# Patient Record
Sex: Female | Born: 1935 | Race: Black or African American | Hispanic: No | Smoking: Never smoker
Health system: Southern US, Community
[De-identification: ages and names within clinical notes are randomized; demographics above are authoritative.]

## PROBLEM LIST (undated history)

## (undated) DIAGNOSIS — K219 Gastro-esophageal reflux disease without esophagitis: Secondary | ICD-10-CM

## (undated) DIAGNOSIS — E785 Hyperlipidemia, unspecified: Secondary | ICD-10-CM

## (undated) DIAGNOSIS — Z8719 Personal history of other diseases of the digestive system: Secondary | ICD-10-CM

## (undated) DIAGNOSIS — M5416 Radiculopathy, lumbar region: Secondary | ICD-10-CM

## (undated) DIAGNOSIS — I10 Essential (primary) hypertension: Secondary | ICD-10-CM

## (undated) DIAGNOSIS — S2239XA Fracture of one rib, unspecified side, initial encounter for closed fracture: Secondary | ICD-10-CM

## (undated) DIAGNOSIS — S62109A Fracture of unspecified carpal bone, unspecified wrist, initial encounter for closed fracture: Secondary | ICD-10-CM

## (undated) DIAGNOSIS — C50911 Malignant neoplasm of unspecified site of right female breast: Secondary | ICD-10-CM

## (undated) DIAGNOSIS — S2249XA Multiple fractures of ribs, unspecified side, initial encounter for closed fracture: Secondary | ICD-10-CM

## (undated) DIAGNOSIS — E669 Obesity, unspecified: Secondary | ICD-10-CM

## (undated) DIAGNOSIS — M1990 Unspecified osteoarthritis, unspecified site: Secondary | ICD-10-CM

## (undated) DIAGNOSIS — C801 Malignant (primary) neoplasm, unspecified: Secondary | ICD-10-CM

## (undated) DIAGNOSIS — M545 Low back pain, unspecified: Secondary | ICD-10-CM

## (undated) DIAGNOSIS — E119 Type 2 diabetes mellitus without complications: Secondary | ICD-10-CM

## (undated) DIAGNOSIS — D0510 Intraductal carcinoma in situ of unspecified breast: Secondary | ICD-10-CM

## (undated) HISTORY — DX: Malignant neoplasm of unspecified site of right female breast: C50.911

## (undated) HISTORY — PX: VESICOVAGINAL FISTULA CLOSURE W/ TAH: SUR271

## (undated) HISTORY — PX: CATARACT EXTRACTION: SUR2

## (undated) HISTORY — PX: OTHER SURGICAL HISTORY: SHX169

## (undated) HISTORY — PX: CARPAL TUNNEL RELEASE: SHX101

## (undated) HISTORY — DX: Malignant (primary) neoplasm, unspecified: C80.1

## (undated) HISTORY — DX: Obesity, unspecified: E66.9

## (undated) HISTORY — DX: Gastro-esophageal reflux disease without esophagitis: K21.9

## (undated) HISTORY — DX: Unspecified osteoarthritis, unspecified site: M19.90

## (undated) HISTORY — DX: Fracture of unspecified carpal bone, unspecified wrist, initial encounter for closed fracture: S62.109A

## (undated) HISTORY — DX: Hyperlipidemia, unspecified: E78.5

## (undated) HISTORY — DX: Multiple fractures of ribs, unspecified side, initial encounter for closed fracture: S22.49XA

## (undated) HISTORY — DX: Intraductal carcinoma in situ of unspecified breast: D05.10

## (undated) HISTORY — DX: Essential (primary) hypertension: I10

## (undated) HISTORY — DX: Type 2 diabetes mellitus without complications: E11.9

## (undated) HISTORY — DX: Fracture of one rib, unspecified side, initial encounter for closed fracture: S22.39XA

---

## 2000-09-16 ENCOUNTER — Ambulatory Visit (HOSPITAL_COMMUNITY): Admission: RE | Admit: 2000-09-16 | Discharge: 2000-09-16 | Payer: Self-pay | Admitting: Family Medicine

## 2000-09-16 ENCOUNTER — Encounter: Payer: Self-pay | Admitting: Family Medicine

## 2000-09-26 ENCOUNTER — Emergency Department (HOSPITAL_COMMUNITY): Admission: EM | Admit: 2000-09-26 | Discharge: 2000-09-26 | Payer: Self-pay | Admitting: Emergency Medicine

## 2000-09-30 ENCOUNTER — Other Ambulatory Visit: Admission: RE | Admit: 2000-09-30 | Discharge: 2000-09-30 | Payer: Self-pay | Admitting: Family Medicine

## 2000-12-03 ENCOUNTER — Ambulatory Visit (HOSPITAL_COMMUNITY): Admission: RE | Admit: 2000-12-03 | Discharge: 2000-12-03 | Payer: Self-pay | Admitting: General Surgery

## 2001-09-17 ENCOUNTER — Ambulatory Visit (HOSPITAL_COMMUNITY): Admission: RE | Admit: 2001-09-17 | Discharge: 2001-09-17 | Payer: Self-pay | Admitting: Family Medicine

## 2001-09-17 ENCOUNTER — Encounter: Payer: Self-pay | Admitting: Family Medicine

## 2001-11-03 ENCOUNTER — Encounter (HOSPITAL_COMMUNITY): Admission: RE | Admit: 2001-11-03 | Discharge: 2001-12-03 | Payer: Self-pay | Admitting: Cardiology

## 2002-04-05 ENCOUNTER — Ambulatory Visit (HOSPITAL_COMMUNITY): Admission: RE | Admit: 2002-04-05 | Discharge: 2002-04-05 | Payer: Self-pay | Admitting: Family Medicine

## 2002-04-05 ENCOUNTER — Encounter: Payer: Self-pay | Admitting: Family Medicine

## 2002-09-20 ENCOUNTER — Encounter: Payer: Self-pay | Admitting: Family Medicine

## 2002-09-20 ENCOUNTER — Ambulatory Visit (HOSPITAL_COMMUNITY): Admission: RE | Admit: 2002-09-20 | Discharge: 2002-09-20 | Payer: Self-pay | Admitting: Family Medicine

## 2003-01-08 HISTORY — PX: OTHER SURGICAL HISTORY: SHX169

## 2003-09-26 ENCOUNTER — Ambulatory Visit (HOSPITAL_COMMUNITY): Admission: RE | Admit: 2003-09-26 | Discharge: 2003-09-26 | Payer: Self-pay | Admitting: Family Medicine

## 2003-11-08 ENCOUNTER — Ambulatory Visit (HOSPITAL_COMMUNITY): Admission: RE | Admit: 2003-11-08 | Discharge: 2003-11-08 | Payer: Self-pay | Admitting: *Deleted

## 2003-11-08 ENCOUNTER — Ambulatory Visit: Payer: Self-pay | Admitting: *Deleted

## 2003-11-11 ENCOUNTER — Encounter: Payer: Self-pay | Admitting: Emergency Medicine

## 2003-11-12 ENCOUNTER — Ambulatory Visit: Payer: Self-pay | Admitting: Physical Medicine & Rehabilitation

## 2003-11-12 ENCOUNTER — Inpatient Hospital Stay (HOSPITAL_COMMUNITY): Admission: EM | Admit: 2003-11-12 | Discharge: 2003-11-16 | Payer: Self-pay | Admitting: Emergency Medicine

## 2003-11-16 ENCOUNTER — Inpatient Hospital Stay (HOSPITAL_COMMUNITY)
Admission: RE | Admit: 2003-11-16 | Discharge: 2003-11-25 | Payer: Self-pay | Admitting: Physical Medicine & Rehabilitation

## 2003-12-19 ENCOUNTER — Ambulatory Visit: Payer: Self-pay | Admitting: Family Medicine

## 2003-12-20 ENCOUNTER — Encounter (HOSPITAL_COMMUNITY): Admission: RE | Admit: 2003-12-20 | Discharge: 2004-01-19 | Payer: Self-pay | Admitting: Orthopedic Surgery

## 2004-01-24 ENCOUNTER — Encounter (HOSPITAL_COMMUNITY): Admission: RE | Admit: 2004-01-24 | Discharge: 2004-02-23 | Payer: Self-pay | Admitting: Orthopedic Surgery

## 2004-02-06 ENCOUNTER — Ambulatory Visit: Payer: Self-pay | Admitting: Family Medicine

## 2004-06-25 ENCOUNTER — Ambulatory Visit: Payer: Self-pay | Admitting: Family Medicine

## 2004-06-29 ENCOUNTER — Ambulatory Visit (HOSPITAL_COMMUNITY): Admission: RE | Admit: 2004-06-29 | Discharge: 2004-06-29 | Payer: Self-pay | Admitting: Family Medicine

## 2004-07-11 ENCOUNTER — Ambulatory Visit (HOSPITAL_COMMUNITY): Admission: RE | Admit: 2004-07-11 | Discharge: 2004-07-11 | Payer: Self-pay | Admitting: Family Medicine

## 2004-09-26 ENCOUNTER — Ambulatory Visit (HOSPITAL_COMMUNITY): Admission: RE | Admit: 2004-09-26 | Discharge: 2004-09-26 | Payer: Self-pay | Admitting: Family Medicine

## 2004-10-03 ENCOUNTER — Ambulatory Visit: Payer: Self-pay | Admitting: Family Medicine

## 2005-01-11 ENCOUNTER — Ambulatory Visit: Payer: Self-pay | Admitting: Family Medicine

## 2005-02-01 ENCOUNTER — Ambulatory Visit: Payer: Self-pay | Admitting: Family Medicine

## 2005-03-27 ENCOUNTER — Ambulatory Visit (HOSPITAL_COMMUNITY): Admission: RE | Admit: 2005-03-27 | Discharge: 2005-03-27 | Payer: Self-pay | Admitting: Family Medicine

## 2005-05-07 ENCOUNTER — Ambulatory Visit: Payer: Self-pay | Admitting: Family Medicine

## 2005-05-13 ENCOUNTER — Ambulatory Visit: Payer: Self-pay | Admitting: Family Medicine

## 2005-05-13 ENCOUNTER — Ambulatory Visit (HOSPITAL_COMMUNITY): Admission: RE | Admit: 2005-05-13 | Discharge: 2005-05-13 | Payer: Self-pay | Admitting: Family Medicine

## 2005-06-24 ENCOUNTER — Ambulatory Visit: Payer: Self-pay | Admitting: Internal Medicine

## 2005-07-03 ENCOUNTER — Ambulatory Visit: Payer: Self-pay | Admitting: Family Medicine

## 2005-08-14 ENCOUNTER — Ambulatory Visit: Payer: Self-pay | Admitting: Family Medicine

## 2005-09-11 ENCOUNTER — Ambulatory Visit (HOSPITAL_COMMUNITY): Admission: RE | Admit: 2005-09-11 | Discharge: 2005-09-11 | Payer: Self-pay | Admitting: Family Medicine

## 2005-10-09 ENCOUNTER — Ambulatory Visit: Payer: Self-pay | Admitting: Family Medicine

## 2005-10-17 ENCOUNTER — Ambulatory Visit: Payer: Self-pay | Admitting: Family Medicine

## 2005-11-13 ENCOUNTER — Ambulatory Visit: Payer: Self-pay | Admitting: Family Medicine

## 2006-01-07 DIAGNOSIS — D0510 Intraductal carcinoma in situ of unspecified breast: Secondary | ICD-10-CM

## 2006-01-07 HISTORY — DX: Intraductal carcinoma in situ of unspecified breast: D05.10

## 2006-01-07 HISTORY — PX: BREAST LUMPECTOMY: SHX2

## 2006-01-31 ENCOUNTER — Ambulatory Visit: Payer: Self-pay | Admitting: Family Medicine

## 2006-01-31 LAB — CONVERTED CEMR LAB: Uric Acid, Serum: 3.1 mg/dL (ref 2.4–7.0)

## 2006-02-03 ENCOUNTER — Ambulatory Visit: Payer: Self-pay | Admitting: Family Medicine

## 2006-02-03 LAB — CONVERTED CEMR LAB: Total CK: 92 units/L (ref 7–177)

## 2006-02-04 ENCOUNTER — Ambulatory Visit (HOSPITAL_COMMUNITY): Admission: RE | Admit: 2006-02-04 | Discharge: 2006-02-04 | Payer: Self-pay | Admitting: Family Medicine

## 2006-03-12 ENCOUNTER — Ambulatory Visit: Payer: Self-pay | Admitting: Family Medicine

## 2006-03-19 ENCOUNTER — Ambulatory Visit: Payer: Self-pay | Admitting: Family Medicine

## 2006-03-31 ENCOUNTER — Ambulatory Visit: Payer: Self-pay | Admitting: Family Medicine

## 2006-03-31 LAB — CONVERTED CEMR LAB
ALT: 34 units/L (ref 0–35)
AST: 30 units/L (ref 0–37)
Albumin: 4.2 g/dL (ref 3.5–5.2)
Alkaline Phosphatase: 59 units/L (ref 39–117)
BUN: 15 mg/dL (ref 6–23)
Basophils Absolute: 0 10*3/uL (ref 0.0–0.1)
Basophils Relative: 0 % (ref 0–1)
Bilirubin, Direct: 0.1 mg/dL (ref 0.0–0.3)
CO2: 35 meq/L — ABNORMAL HIGH (ref 19–32)
Calcium: 10 mg/dL (ref 8.4–10.5)
Chloride: 93 meq/L — ABNORMAL LOW (ref 96–112)
Cholesterol: 165 mg/dL (ref 0–200)
Creatinine, Ser: 0.68 mg/dL (ref 0.40–1.20)
Eosinophils Absolute: 0.1 10*3/uL (ref 0.0–0.7)
Eosinophils Relative: 1 % (ref 0–5)
Glucose, Bld: 97 mg/dL (ref 70–99)
HCT: 42.8 % (ref 36.0–46.0)
HDL: 64 mg/dL (ref >39)
Hemoglobin: 14 g/dL (ref 12.0–15.0)
Indirect Bilirubin: 0.4 mg/dL (ref 0.0–0.9)
LDL Cholesterol: 84 mg/dL (ref 0–99)
Lymphocytes Relative: 20 % (ref 12–46)
Lymphs Abs: 1.2 10*3/uL (ref 0.7–3.3)
MCHC: 32.6 g/dL (ref 30.0–36.0)
MCV: 89.7 fL (ref 78.0–100.0)
Microalb, Ur: 1.72 mg/dL (ref 0.00–1.89)
Monocytes Absolute: 0.5 10*3/uL (ref 0.2–0.7)
Monocytes Relative: 8 % (ref 3–11)
Neutro Abs: 3.8 10*3/uL (ref 1.7–7.7)
Neutrophils Relative %: 69 % (ref 43–77)
Platelets: 340 10*3/uL (ref 150–400)
Potassium: 4.2 meq/L (ref 3.5–5.3)
RBC: 4.76 M/uL (ref 3.87–5.11)
RDW: 15.6 % — ABNORMAL HIGH (ref 11.5–14.0)
Sodium: 136 meq/L (ref 135–145)
Total Bilirubin: 0.5 mg/dL (ref 0.3–1.2)
Total CHOL/HDL Ratio: 2.6
Total Protein: 7.3 g/dL (ref 6.0–8.3)
Triglycerides: 85 mg/dL (ref <150)
VLDL: 17 mg/dL (ref 0–40)
WBC: 5.5 10*3/uL (ref 4.0–10.5)

## 2006-04-01 ENCOUNTER — Ambulatory Visit (HOSPITAL_COMMUNITY): Admission: RE | Admit: 2006-04-01 | Discharge: 2006-04-01 | Payer: Self-pay | Admitting: Family Medicine

## 2006-04-01 ENCOUNTER — Encounter: Payer: Self-pay | Admitting: Family Medicine

## 2006-04-01 LAB — CONVERTED CEMR LAB: Hgb A1c MFr Bld: 6.6 % — ABNORMAL HIGH (ref 4.6–6.1)

## 2006-04-05 ENCOUNTER — Emergency Department (HOSPITAL_COMMUNITY): Admission: EM | Admit: 2006-04-05 | Discharge: 2006-04-05 | Payer: Self-pay | Admitting: Emergency Medicine

## 2006-04-16 ENCOUNTER — Other Ambulatory Visit: Admission: RE | Admit: 2006-04-16 | Discharge: 2006-04-16 | Payer: Self-pay | Admitting: Family Medicine

## 2006-04-16 ENCOUNTER — Encounter: Payer: Self-pay | Admitting: Family Medicine

## 2006-04-16 ENCOUNTER — Ambulatory Visit: Payer: Self-pay | Admitting: Family Medicine

## 2006-04-21 ENCOUNTER — Ambulatory Visit (HOSPITAL_COMMUNITY): Admission: RE | Admit: 2006-04-21 | Discharge: 2006-04-21 | Payer: Self-pay | Admitting: Family Medicine

## 2006-05-06 ENCOUNTER — Ambulatory Visit: Payer: Self-pay | Admitting: Internal Medicine

## 2006-05-19 ENCOUNTER — Ambulatory Visit: Payer: Self-pay | Admitting: Internal Medicine

## 2006-05-19 ENCOUNTER — Ambulatory Visit (HOSPITAL_COMMUNITY): Admission: RE | Admit: 2006-05-19 | Discharge: 2006-05-19 | Payer: Self-pay | Admitting: Internal Medicine

## 2006-05-19 HISTORY — PX: COLONOSCOPY: SHX174

## 2006-06-17 ENCOUNTER — Ambulatory Visit: Payer: Self-pay | Admitting: Family Medicine

## 2006-09-15 ENCOUNTER — Ambulatory Visit (HOSPITAL_COMMUNITY): Admission: RE | Admit: 2006-09-15 | Discharge: 2006-09-15 | Payer: Self-pay | Admitting: Family Medicine

## 2006-09-22 ENCOUNTER — Encounter: Admission: RE | Admit: 2006-09-22 | Discharge: 2006-09-22 | Payer: Self-pay | Admitting: Family Medicine

## 2006-09-22 ENCOUNTER — Encounter (INDEPENDENT_AMBULATORY_CARE_PROVIDER_SITE_OTHER): Payer: Self-pay | Admitting: Diagnostic Radiology

## 2006-09-26 ENCOUNTER — Encounter: Payer: Self-pay | Admitting: Family Medicine

## 2006-09-26 LAB — CONVERTED CEMR LAB
ALT: 41 units/L — ABNORMAL HIGH (ref 0–35)
AST: 42 units/L — ABNORMAL HIGH (ref 0–37)
Albumin: 4 g/dL (ref 3.5–5.2)
Alkaline Phosphatase: 55 units/L (ref 39–117)
BUN: 19 mg/dL (ref 6–23)
Bilirubin, Direct: 0.1 mg/dL (ref 0.0–0.3)
CO2: 32 meq/L (ref 19–32)
Calcium: 10.1 mg/dL (ref 8.4–10.5)
Chloride: 101 meq/L (ref 96–112)
Creatinine, Ser: 0.58 mg/dL (ref 0.40–1.20)
Glucose, Bld: 105 mg/dL — ABNORMAL HIGH (ref 70–99)
Indirect Bilirubin: 0.6 mg/dL (ref 0.0–0.9)
Potassium: 3.7 meq/L (ref 3.5–5.3)
Sodium: 137 meq/L (ref 135–145)
Total Bilirubin: 0.7 mg/dL (ref 0.3–1.2)
Total Protein: 4.6 g/dL — ABNORMAL LOW (ref 6.0–8.3)

## 2006-09-28 ENCOUNTER — Encounter: Admission: RE | Admit: 2006-09-28 | Discharge: 2006-09-28 | Payer: Self-pay | Admitting: Family Medicine

## 2006-09-29 ENCOUNTER — Encounter: Payer: Self-pay | Admitting: Family Medicine

## 2006-09-29 LAB — CONVERTED CEMR LAB
HCV Ab: NEGATIVE
Hep A IgM: NEGATIVE
Hep B C IgM: NEGATIVE
Hepatitis B Surface Ag: NEGATIVE

## 2006-10-01 ENCOUNTER — Ambulatory Visit: Payer: Self-pay | Admitting: Family Medicine

## 2006-10-08 ENCOUNTER — Ambulatory Visit: Payer: Self-pay | Admitting: Gastroenterology

## 2006-11-03 ENCOUNTER — Encounter: Payer: Self-pay | Admitting: Family Medicine

## 2006-11-08 ENCOUNTER — Encounter: Payer: Self-pay | Admitting: Family Medicine

## 2006-11-08 LAB — CONVERTED CEMR LAB
ALT: 42 U/L — ABNORMAL HIGH
AST: 33 U/L
Albumin: 4.4 g/dL
Alkaline Phosphatase: 59 U/L
Bilirubin, Direct: 0.1 mg/dL
Indirect Bilirubin: 0.3 mg/dL
Total Bilirubin: 0.4 mg/dL
Total Protein: 7.3 g/dL

## 2006-11-10 ENCOUNTER — Ambulatory Visit: Payer: Self-pay | Admitting: Family Medicine

## 2007-01-08 HISTORY — PX: CHOLECYSTECTOMY: SHX55

## 2007-01-21 ENCOUNTER — Ambulatory Visit: Payer: Self-pay | Admitting: Family Medicine

## 2007-01-22 ENCOUNTER — Encounter: Payer: Self-pay | Admitting: Family Medicine

## 2007-01-22 LAB — CONVERTED CEMR LAB
ALT: 47 units/L — ABNORMAL HIGH (ref 0–35)
AST: 39 units/L — ABNORMAL HIGH (ref 0–37)
Albumin: 4.1 g/dL (ref 3.5–5.2)
Alkaline Phosphatase: 60 units/L (ref 39–117)
BUN: 13 mg/dL (ref 6–23)
Bilirubin, Direct: 0.1 mg/dL (ref 0.0–0.3)
CO2: 28 meq/L (ref 19–32)
Calcium: 10.1 mg/dL (ref 8.4–10.5)
Chloride: 103 meq/L (ref 96–112)
Cholesterol: 209 mg/dL — ABNORMAL HIGH (ref 0–200)
Creatinine, Ser: 0.66 mg/dL (ref 0.40–1.20)
Glucose, Bld: 101 mg/dL — ABNORMAL HIGH (ref 70–99)
HDL: 53 mg/dL (ref >39)
Indirect Bilirubin: 0.3 mg/dL (ref 0.0–0.9)
LDL Cholesterol: 127 mg/dL — ABNORMAL HIGH (ref 0–99)
Potassium: 4.6 meq/L (ref 3.5–5.3)
Sodium: 141 meq/L (ref 135–145)
Total Bilirubin: 0.4 mg/dL (ref 0.3–1.2)
Total CHOL/HDL Ratio: 3.9
Total Protein: 7.2 g/dL (ref 6.0–8.3)
Triglycerides: 146 mg/dL (ref <150)
VLDL: 29 mg/dL (ref 0–40)

## 2007-01-26 ENCOUNTER — Encounter: Payer: Self-pay | Admitting: Family Medicine

## 2007-01-26 LAB — CONVERTED CEMR LAB
HCV Ab: NEGATIVE
Hep A IgM: NEGATIVE
Hep B C IgM: NEGATIVE
Hepatitis B Surface Ag: NEGATIVE

## 2007-02-09 ENCOUNTER — Ambulatory Visit (HOSPITAL_COMMUNITY): Admission: RE | Admit: 2007-02-09 | Discharge: 2007-02-09 | Payer: Self-pay | Admitting: Family Medicine

## 2007-02-16 ENCOUNTER — Encounter (HOSPITAL_COMMUNITY): Admission: RE | Admit: 2007-02-16 | Discharge: 2007-03-18 | Payer: Self-pay | Admitting: Family Medicine

## 2007-03-04 ENCOUNTER — Ambulatory Visit (HOSPITAL_COMMUNITY): Admission: RE | Admit: 2007-03-04 | Discharge: 2007-03-05 | Payer: Self-pay | Admitting: General Surgery

## 2007-03-04 ENCOUNTER — Encounter (INDEPENDENT_AMBULATORY_CARE_PROVIDER_SITE_OTHER): Payer: Self-pay | Admitting: General Surgery

## 2007-04-28 DIAGNOSIS — E1140 Type 2 diabetes mellitus with diabetic neuropathy, unspecified: Secondary | ICD-10-CM | POA: Insufficient documentation

## 2007-04-28 DIAGNOSIS — I10 Essential (primary) hypertension: Secondary | ICD-10-CM | POA: Insufficient documentation

## 2007-04-28 DIAGNOSIS — E785 Hyperlipidemia, unspecified: Secondary | ICD-10-CM | POA: Insufficient documentation

## 2007-04-28 DIAGNOSIS — E1169 Type 2 diabetes mellitus with other specified complication: Secondary | ICD-10-CM | POA: Insufficient documentation

## 2007-04-28 DIAGNOSIS — E669 Obesity, unspecified: Secondary | ICD-10-CM | POA: Insufficient documentation

## 2007-04-28 DIAGNOSIS — M479 Spondylosis, unspecified: Secondary | ICD-10-CM | POA: Insufficient documentation

## 2007-04-29 ENCOUNTER — Ambulatory Visit: Payer: Self-pay | Admitting: Family Medicine

## 2007-04-29 LAB — CONVERTED CEMR LAB
ALT: 62 units/L — ABNORMAL HIGH (ref 0–35)
AST: 44 units/L — ABNORMAL HIGH (ref 0–37)
Albumin: 4.7 g/dL (ref 3.5–5.2)
Alkaline Phosphatase: 76 units/L (ref 39–117)
Bilirubin, Direct: 0.1 mg/dL (ref 0.0–0.3)
Cholesterol: 181 mg/dL (ref 0–200)
HDL: 66 mg/dL (ref >39)
Indirect Bilirubin: 0.3 mg/dL (ref 0.0–0.9)
LDL Cholesterol: 95 mg/dL (ref 0–99)
Total Bilirubin: 0.4 mg/dL (ref 0.3–1.2)
Total CHOL/HDL Ratio: 2.7
Total Protein: 8 g/dL (ref 6.0–8.3)
Triglycerides: 101 mg/dL (ref <150)
VLDL: 20 mg/dL (ref 0–40)

## 2007-04-30 ENCOUNTER — Encounter: Payer: Self-pay | Admitting: Family Medicine

## 2007-04-30 LAB — CONVERTED CEMR LAB
HCV Ab: NEGATIVE
Hep A IgM: NEGATIVE
Hep B C IgM: NEGATIVE
Hepatitis B Surface Ag: NEGATIVE
Microalb, Ur: 0.41 mg/dL (ref 0.00–1.89)

## 2007-06-10 ENCOUNTER — Encounter: Payer: Self-pay | Admitting: Family Medicine

## 2007-06-10 LAB — CONVERTED CEMR LAB
ALT: 91 units/L — ABNORMAL HIGH (ref 0–35)
AST: 79 units/L — ABNORMAL HIGH (ref 0–37)
Albumin: 4.4 g/dL (ref 3.5–5.2)
Alkaline Phosphatase: 65 units/L (ref 39–117)
Bilirubin, Direct: 0.1 mg/dL (ref 0.0–0.3)
Indirect Bilirubin: 0.2 mg/dL (ref 0.0–0.9)
Total Bilirubin: 0.3 mg/dL (ref 0.3–1.2)
Total Protein: 7.7 g/dL (ref 6.0–8.3)

## 2007-06-26 ENCOUNTER — Ambulatory Visit: Payer: Self-pay | Admitting: Gastroenterology

## 2007-06-29 ENCOUNTER — Encounter: Payer: Self-pay | Admitting: Family Medicine

## 2007-06-29 ENCOUNTER — Ambulatory Visit: Payer: Self-pay | Admitting: Family Medicine

## 2007-06-29 DIAGNOSIS — K219 Gastro-esophageal reflux disease without esophagitis: Secondary | ICD-10-CM | POA: Insufficient documentation

## 2007-06-29 LAB — CONVERTED CEMR LAB: Glucose, Bld: 106 mg/dL

## 2007-08-11 ENCOUNTER — Ambulatory Visit: Payer: Self-pay | Admitting: Family Medicine

## 2007-08-11 ENCOUNTER — Encounter: Payer: Self-pay | Admitting: Family Medicine

## 2007-08-11 LAB — CONVERTED CEMR LAB
Glucose, Bld: 113 mg/dL
Hgb A1c MFr Bld: 6.9 %

## 2007-08-18 ENCOUNTER — Ambulatory Visit (HOSPITAL_COMMUNITY): Admission: RE | Admit: 2007-08-18 | Discharge: 2007-08-18 | Payer: Self-pay | Admitting: Family Medicine

## 2007-09-09 ENCOUNTER — Ambulatory Visit: Payer: Self-pay | Admitting: Orthopedic Surgery

## 2007-09-09 DIAGNOSIS — IMO0002 Reserved for concepts with insufficient information to code with codable children: Secondary | ICD-10-CM | POA: Insufficient documentation

## 2007-09-09 DIAGNOSIS — M1710 Unilateral primary osteoarthritis, unspecified knee: Secondary | ICD-10-CM | POA: Insufficient documentation

## 2007-09-25 ENCOUNTER — Ambulatory Visit: Payer: Self-pay | Admitting: Gastroenterology

## 2007-09-29 ENCOUNTER — Encounter: Payer: Self-pay | Admitting: Family Medicine

## 2007-10-08 ENCOUNTER — Encounter: Payer: Self-pay | Admitting: Family Medicine

## 2007-10-12 ENCOUNTER — Ambulatory Visit: Payer: Self-pay | Admitting: Family Medicine

## 2007-10-12 LAB — CONVERTED CEMR LAB: Glucose, Bld: 97 mg/dL

## 2007-10-13 DIAGNOSIS — D0590 Unspecified type of carcinoma in situ of unspecified breast: Secondary | ICD-10-CM | POA: Insufficient documentation

## 2007-12-14 ENCOUNTER — Encounter: Payer: Self-pay | Admitting: Family Medicine

## 2007-12-15 ENCOUNTER — Ambulatory Visit: Payer: Self-pay | Admitting: Family Medicine

## 2007-12-15 LAB — CONVERTED CEMR LAB
Blood Glucose, Fasting: 106 mg/dL
Hgb A1c MFr Bld: 6.3 %

## 2007-12-16 ENCOUNTER — Encounter: Payer: Self-pay | Admitting: Family Medicine

## 2007-12-16 LAB — CONVERTED CEMR LAB
ALT: 75 units/L — ABNORMAL HIGH (ref 0–35)
AST: 61 units/L — ABNORMAL HIGH (ref 0–37)
Albumin: 4.4 g/dL (ref 3.5–5.2)
Alkaline Phosphatase: 54 units/L (ref 39–117)
BUN: 17 mg/dL (ref 6–23)
Basophils Absolute: 0 10*3/uL (ref 0.0–0.1)
Basophils Relative: 0 % (ref 0–1)
Bilirubin, Direct: 0.1 mg/dL (ref 0.0–0.3)
CO2: 24 meq/L (ref 19–32)
Calcium: 9.9 mg/dL (ref 8.4–10.5)
Chloride: 102 meq/L (ref 96–112)
Cholesterol: 229 mg/dL — ABNORMAL HIGH (ref 0–200)
Creatinine, Ser: 0.7 mg/dL (ref 0.40–1.20)
Eosinophils Absolute: 0.1 10*3/uL (ref 0.0–0.7)
Eosinophils Relative: 2 % (ref 0–5)
Glucose, Bld: 109 mg/dL — ABNORMAL HIGH (ref 70–99)
HCT: 41.1 % (ref 36.0–46.0)
HCV Ab: NEGATIVE
HDL: 58 mg/dL (ref >39)
Hemoglobin: 13.2 g/dL (ref 12.0–15.0)
Hep A IgM: NEGATIVE
Hep B C IgM: NEGATIVE
Hepatitis B Surface Ag: NEGATIVE
Indirect Bilirubin: 0.5 mg/dL (ref 0.0–0.9)
LDL Cholesterol: 146 mg/dL — ABNORMAL HIGH (ref 0–99)
Lymphocytes Relative: 36 % (ref 12–46)
Lymphs Abs: 1.7 10*3/uL (ref 0.7–4.0)
MCHC: 32.1 g/dL (ref 30.0–36.0)
MCV: 90.1 fL (ref 78.0–100.0)
Monocytes Absolute: 0.5 10*3/uL (ref 0.1–1.0)
Monocytes Relative: 10 % (ref 3–12)
Neutro Abs: 2.4 10*3/uL (ref 1.7–7.7)
Neutrophils Relative %: 52 % (ref 43–77)
Platelets: 288 10*3/uL (ref 150–400)
Potassium: 4.5 meq/L (ref 3.5–5.3)
RBC: 4.56 M/uL (ref 3.87–5.11)
RDW: 15.2 % (ref 11.5–15.5)
Sodium: 141 meq/L (ref 135–145)
TSH: 0.35 microintl units/mL (ref 0.350–4.50)
Total Bilirubin: 0.6 mg/dL (ref 0.3–1.2)
Total CHOL/HDL Ratio: 3.9
Total Protein: 7.4 g/dL (ref 6.0–8.3)
Triglycerides: 124 mg/dL (ref <150)
VLDL: 25 mg/dL (ref 0–40)
WBC: 4.7 10*3/uL (ref 4.0–10.5)

## 2008-01-08 DIAGNOSIS — C50911 Malignant neoplasm of unspecified site of right female breast: Secondary | ICD-10-CM

## 2008-01-08 HISTORY — DX: Malignant neoplasm of unspecified site of right female breast: C50.911

## 2008-02-09 ENCOUNTER — Ambulatory Visit: Payer: Self-pay | Admitting: Family Medicine

## 2008-02-09 LAB — CONVERTED CEMR LAB: Glucose, Bld: 141 mg/dL

## 2008-02-13 ENCOUNTER — Emergency Department (HOSPITAL_COMMUNITY): Admission: EM | Admit: 2008-02-13 | Discharge: 2008-02-13 | Payer: Self-pay | Admitting: Emergency Medicine

## 2008-02-22 ENCOUNTER — Encounter: Payer: Self-pay | Admitting: Family Medicine

## 2008-03-02 ENCOUNTER — Ambulatory Visit: Payer: Self-pay | Admitting: Gastroenterology

## 2008-03-03 LAB — CONVERTED CEMR LAB
ALT: 95 units/L — ABNORMAL HIGH (ref 0–35)
AST: 97 units/L — ABNORMAL HIGH (ref 0–37)
Albumin: 4.3 g/dL (ref 3.5–5.2)
Alkaline Phosphatase: 60 units/L (ref 39–117)
Basophils Absolute: 0 10*3/uL (ref 0.0–0.1)
Basophils Relative: 0 % (ref 0–1)
Bilirubin, Direct: 0.1 mg/dL (ref 0.0–0.3)
Eosinophils Absolute: 0.1 10*3/uL (ref 0.0–0.7)
Eosinophils Relative: 2 % (ref 0–5)
HCT: 42.6 % (ref 36.0–46.0)
Hemoglobin: 14.3 g/dL (ref 12.0–15.0)
INR: 0.9 (ref 0.0–1.5)
Indirect Bilirubin: 0.1 mg/dL (ref 0.0–0.9)
Lymphocytes Relative: 28 % (ref 12–46)
Lymphs Abs: 1.8 10*3/uL (ref 0.7–4.0)
MCHC: 33.6 g/dL (ref 30.0–36.0)
MCV: 91.7 fL (ref 78.0–100.0)
Monocytes Absolute: 0.5 10*3/uL (ref 0.1–1.0)
Monocytes Relative: 8 % (ref 3–12)
Neutro Abs: 3.9 10*3/uL (ref 1.7–7.7)
Neutrophils Relative %: 62 % (ref 43–77)
Platelets: 279 10*3/uL (ref 150–400)
Prothrombin Time: 12.3 s (ref 11.6–15.2)
RBC: 4.64 M/uL (ref 3.87–5.11)
RDW: 15.6 % — ABNORMAL HIGH (ref 11.5–15.5)
Total Bilirubin: 0.2 mg/dL — ABNORMAL LOW (ref 0.3–1.2)
Total Protein: 7.5 g/dL (ref 6.0–8.3)
WBC: 6.3 10*3/uL (ref 4.0–10.5)

## 2008-03-29 ENCOUNTER — Encounter: Payer: Self-pay | Admitting: Family Medicine

## 2008-03-30 ENCOUNTER — Encounter: Payer: Self-pay | Admitting: Family Medicine

## 2008-04-14 ENCOUNTER — Ambulatory Visit: Payer: Self-pay | Admitting: Family Medicine

## 2008-04-15 ENCOUNTER — Encounter: Payer: Self-pay | Admitting: Family Medicine

## 2008-04-15 LAB — CONVERTED CEMR LAB
ALT: 94 units/L — ABNORMAL HIGH (ref 0–35)
AST: 96 units/L — ABNORMAL HIGH (ref 0–37)
Albumin: 4.6 g/dL (ref 3.5–5.2)
Alkaline Phosphatase: 57 units/L (ref 39–117)
Bilirubin, Direct: 0.1 mg/dL (ref 0.0–0.3)
HCV Ab: NEGATIVE
Hep A IgM: NEGATIVE
Hep B C IgM: NEGATIVE
Hepatitis B Surface Ag: NEGATIVE
Indirect Bilirubin: 0.4 mg/dL (ref 0.0–0.9)
Total Bilirubin: 0.5 mg/dL (ref 0.3–1.2)
Total Protein: 7.3 g/dL (ref 6.0–8.3)

## 2008-05-09 DIAGNOSIS — K921 Melena: Secondary | ICD-10-CM | POA: Insufficient documentation

## 2008-05-09 DIAGNOSIS — M109 Gout, unspecified: Secondary | ICD-10-CM | POA: Insufficient documentation

## 2008-05-09 DIAGNOSIS — Z853 Personal history of malignant neoplasm of breast: Secondary | ICD-10-CM | POA: Insufficient documentation

## 2008-05-10 ENCOUNTER — Ambulatory Visit: Payer: Self-pay | Admitting: Gastroenterology

## 2008-05-10 DIAGNOSIS — K7689 Other specified diseases of liver: Secondary | ICD-10-CM | POA: Insufficient documentation

## 2008-05-25 ENCOUNTER — Encounter: Payer: Self-pay | Admitting: Family Medicine

## 2008-05-26 ENCOUNTER — Telehealth: Payer: Self-pay | Admitting: Family Medicine

## 2008-06-08 ENCOUNTER — Encounter: Payer: Self-pay | Admitting: Family Medicine

## 2008-06-14 ENCOUNTER — Encounter: Payer: Self-pay | Admitting: Family Medicine

## 2008-08-02 ENCOUNTER — Encounter: Payer: Self-pay | Admitting: Family Medicine

## 2008-08-03 LAB — CONVERTED CEMR LAB
ALT: 50 units/L — ABNORMAL HIGH (ref 0–35)
AST: 50 units/L — ABNORMAL HIGH (ref 0–37)
Albumin: 4.3 g/dL (ref 3.5–5.2)
Alkaline Phosphatase: 55 units/L (ref 39–117)
BUN: 14 mg/dL (ref 6–23)
Bilirubin, Direct: 0.1 mg/dL (ref 0.0–0.3)
CO2: 28 meq/L (ref 19–32)
Calcium: 9.9 mg/dL (ref 8.4–10.5)
Chloride: 102 meq/L (ref 96–112)
Cholesterol: 233 mg/dL — ABNORMAL HIGH (ref 0–200)
Creatinine, Ser: 0.67 mg/dL (ref 0.40–1.20)
Creatinine, Urine: 93 mg/dL
Glucose, Bld: 99 mg/dL (ref 70–99)
HDL: 56 mg/dL (ref >39)
Indirect Bilirubin: 0.3 mg/dL (ref 0.0–0.9)
LDL Cholesterol: 146 mg/dL — ABNORMAL HIGH (ref 0–99)
Microalb Creat Ratio: 5.7 mg/g (ref 0.0–30.0)
Microalb, Ur: 0.53 mg/dL (ref 0.00–1.89)
Potassium: 4.6 meq/L (ref 3.5–5.3)
Sodium: 141 meq/L (ref 135–145)
Total Bilirubin: 0.4 mg/dL (ref 0.3–1.2)
Total CHOL/HDL Ratio: 4.2
Total Protein: 7.1 g/dL (ref 6.0–8.3)
Triglycerides: 157 mg/dL — ABNORMAL HIGH (ref <150)
Uric Acid, Serum: 3.7 mg/dL (ref 2.4–7.0)
VLDL: 31 mg/dL (ref 0–40)

## 2008-08-04 ENCOUNTER — Ambulatory Visit: Payer: Self-pay | Admitting: Family Medicine

## 2008-08-04 LAB — CONVERTED CEMR LAB
Blood Glucose, Fasting: 103 mg/dL
Hgb A1c MFr Bld: 6.8 %

## 2008-08-31 ENCOUNTER — Telehealth: Payer: Self-pay | Admitting: Family Medicine

## 2008-09-02 ENCOUNTER — Encounter: Payer: Self-pay | Admitting: Family Medicine

## 2008-09-23 ENCOUNTER — Ambulatory Visit (HOSPITAL_COMMUNITY): Admission: RE | Admit: 2008-09-23 | Discharge: 2008-09-23 | Payer: Self-pay | Admitting: General Surgery

## 2008-09-29 ENCOUNTER — Encounter (INDEPENDENT_AMBULATORY_CARE_PROVIDER_SITE_OTHER): Payer: Self-pay | Admitting: Diagnostic Radiology

## 2008-09-29 ENCOUNTER — Ambulatory Visit (HOSPITAL_COMMUNITY): Admission: RE | Admit: 2008-09-29 | Discharge: 2008-09-29 | Payer: Self-pay | Admitting: General Surgery

## 2008-09-30 ENCOUNTER — Ambulatory Visit (HOSPITAL_COMMUNITY): Admission: RE | Admit: 2008-09-30 | Discharge: 2008-09-30 | Payer: Self-pay | Admitting: General Surgery

## 2008-10-14 ENCOUNTER — Encounter: Payer: Self-pay | Admitting: Family Medicine

## 2008-10-19 ENCOUNTER — Encounter: Payer: Self-pay | Admitting: Family Medicine

## 2008-11-03 ENCOUNTER — Encounter: Payer: Self-pay | Admitting: Family Medicine

## 2008-11-07 HISTORY — PX: OTHER SURGICAL HISTORY: SHX169

## 2008-11-11 ENCOUNTER — Encounter: Payer: Self-pay | Admitting: Family Medicine

## 2008-11-30 ENCOUNTER — Ambulatory Visit: Payer: Self-pay | Admitting: Family Medicine

## 2008-11-30 LAB — CONVERTED CEMR LAB
Glucose, Bld: 123 mg/dL
Hgb A1c MFr Bld: 5.9 %

## 2008-12-08 DIAGNOSIS — Z862 Personal history of diseases of the blood and blood-forming organs and certain disorders involving the immune mechanism: Secondary | ICD-10-CM | POA: Insufficient documentation

## 2008-12-08 DIAGNOSIS — Z8639 Personal history of other endocrine, nutritional and metabolic disease: Secondary | ICD-10-CM | POA: Insufficient documentation

## 2008-12-08 LAB — CONVERTED CEMR LAB
ALT: 37 units/L — ABNORMAL HIGH (ref 0–35)
AST: 38 units/L — ABNORMAL HIGH (ref 0–37)
Albumin: 4.3 g/dL (ref 3.5–5.2)
Alkaline Phosphatase: 57 units/L (ref 39–117)
Bilirubin, Direct: <0.1 mg/dL (ref 0.0–0.3)
Cholesterol: 258 mg/dL — ABNORMAL HIGH (ref 0–200)
HDL: 48 mg/dL (ref >39)
LDL Cholesterol: 183 mg/dL — ABNORMAL HIGH (ref 0–99)
Total Bilirubin: 0.3 mg/dL (ref 0.3–1.2)
Total CHOL/HDL Ratio: 5.4
Total Protein: 7.2 g/dL (ref 6.0–8.3)
Triglycerides: 137 mg/dL (ref <150)
VLDL: 27 mg/dL (ref 0–40)

## 2008-12-11 LAB — CONVERTED CEMR LAB
ALT: 53 units/L — ABNORMAL HIGH (ref 0–35)
AST: 65 units/L — ABNORMAL HIGH (ref 0–37)

## 2008-12-13 ENCOUNTER — Telehealth: Payer: Self-pay | Admitting: Family Medicine

## 2008-12-21 ENCOUNTER — Ambulatory Visit: Payer: Self-pay | Admitting: Gastroenterology

## 2008-12-26 ENCOUNTER — Encounter: Payer: Self-pay | Admitting: Family Medicine

## 2009-01-03 ENCOUNTER — Telehealth: Payer: Self-pay | Admitting: Family Medicine

## 2009-01-09 ENCOUNTER — Encounter: Payer: Self-pay | Admitting: Family Medicine

## 2009-01-13 LAB — CONVERTED CEMR LAB
ALT: 31 units/L (ref 0–35)
AST: 32 units/L (ref 0–37)

## 2009-01-24 ENCOUNTER — Encounter: Payer: Self-pay | Admitting: Family Medicine

## 2009-02-02 ENCOUNTER — Ambulatory Visit: Payer: Self-pay | Admitting: Family Medicine

## 2009-02-02 LAB — CONVERTED CEMR LAB: Glucose, Bld: 116 mg/dL

## 2009-02-06 ENCOUNTER — Encounter: Payer: Self-pay | Admitting: Family Medicine

## 2009-02-20 ENCOUNTER — Encounter: Payer: Self-pay | Admitting: Family Medicine

## 2009-03-01 ENCOUNTER — Encounter: Payer: Self-pay | Admitting: Family Medicine

## 2009-03-06 ENCOUNTER — Encounter: Payer: Self-pay | Admitting: Family Medicine

## 2009-03-07 ENCOUNTER — Encounter: Payer: Self-pay | Admitting: Family Medicine

## 2009-03-13 LAB — CONVERTED CEMR LAB
ALT: 34 units/L (ref 0–35)
AST: 38 units/L — ABNORMAL HIGH (ref 0–37)
Albumin: 4.2 g/dL (ref 3.5–5.2)
Alkaline Phosphatase: 59 units/L (ref 39–117)
Bilirubin, Direct: 0.1 mg/dL (ref 0.0–0.3)
Indirect Bilirubin: 0.3 mg/dL (ref 0.0–0.9)
Total Bilirubin: 0.4 mg/dL (ref 0.3–1.2)
Total Protein: 6.8 g/dL (ref 6.0–8.3)

## 2009-03-16 ENCOUNTER — Telehealth: Payer: Self-pay | Admitting: Family Medicine

## 2009-04-03 ENCOUNTER — Encounter: Payer: Self-pay | Admitting: Family Medicine

## 2009-04-13 ENCOUNTER — Ambulatory Visit: Payer: Self-pay | Admitting: Gastroenterology

## 2009-04-13 ENCOUNTER — Ambulatory Visit: Payer: Self-pay | Admitting: Family Medicine

## 2009-04-13 DIAGNOSIS — R5381 Other malaise: Secondary | ICD-10-CM | POA: Insufficient documentation

## 2009-04-13 DIAGNOSIS — R5383 Other fatigue: Secondary | ICD-10-CM

## 2009-04-18 ENCOUNTER — Encounter: Payer: Self-pay | Admitting: Family Medicine

## 2009-04-18 LAB — CONVERTED CEMR LAB
Cholesterol: 203 mg/dL — ABNORMAL HIGH (ref 0–200)
HDL: 48 mg/dL (ref >39)
Hgb A1c MFr Bld: 7.3 % — ABNORMAL HIGH (ref 4.6–6.1)
LDL Cholesterol: 133 mg/dL — ABNORMAL HIGH (ref 0–99)
TSH: 0.848 microintl units/mL (ref 0.350–4.500)
Total CHOL/HDL Ratio: 4.2
Triglycerides: 112 mg/dL (ref <150)
VLDL: 22 mg/dL (ref 0–40)
Vit D, 25-Hydroxy: <10 ng/mL — ABNORMAL LOW (ref 30–89)

## 2009-04-19 LAB — CONVERTED CEMR LAB
BUN: 18 mg/dL (ref 6–23)
CO2: 27 meq/L (ref 19–32)
Calcium: 9.9 mg/dL (ref 8.4–10.5)
Chloride: 98 meq/L (ref 96–112)
Creatinine, Ser: 0.6 mg/dL (ref 0.40–1.20)
Glucose, Bld: 113 mg/dL — ABNORMAL HIGH (ref 70–99)
Potassium: 4.3 meq/L (ref 3.5–5.3)
Sodium: 140 meq/L (ref 135–145)

## 2009-05-01 ENCOUNTER — Encounter: Payer: Self-pay | Admitting: Family Medicine

## 2009-05-08 ENCOUNTER — Encounter: Payer: Self-pay | Admitting: Family Medicine

## 2009-05-15 ENCOUNTER — Encounter: Payer: Self-pay | Admitting: Family Medicine

## 2009-05-29 ENCOUNTER — Encounter: Payer: Self-pay | Admitting: Family Medicine

## 2009-05-31 ENCOUNTER — Encounter: Payer: Self-pay | Admitting: Family Medicine

## 2009-06-06 ENCOUNTER — Encounter: Payer: Self-pay | Admitting: Family Medicine

## 2009-06-12 ENCOUNTER — Encounter: Payer: Self-pay | Admitting: Family Medicine

## 2009-06-26 ENCOUNTER — Encounter: Payer: Self-pay | Admitting: Family Medicine

## 2009-07-13 ENCOUNTER — Ambulatory Visit: Payer: Self-pay | Admitting: Family Medicine

## 2009-07-17 ENCOUNTER — Encounter: Payer: Self-pay | Admitting: Family Medicine

## 2009-07-17 LAB — CONVERTED CEMR LAB
Cholesterol: 222 mg/dL — ABNORMAL HIGH (ref 0–200)
HDL: 49 mg/dL (ref >39)
Hgb A1c MFr Bld: 6.7 % — ABNORMAL HIGH (ref <5.7)
LDL Cholesterol: 146 mg/dL — ABNORMAL HIGH (ref 0–99)
Total CHOL/HDL Ratio: 4.5
Triglycerides: 136 mg/dL (ref <150)
VLDL: 27 mg/dL (ref 0–40)
Vit D, 25-Hydroxy: 43 ng/mL (ref 30–89)

## 2009-09-07 ENCOUNTER — Ambulatory Visit: Payer: Self-pay | Admitting: Family Medicine

## 2009-09-15 ENCOUNTER — Ambulatory Visit: Payer: Self-pay | Admitting: Family Medicine

## 2009-10-02 ENCOUNTER — Encounter: Payer: Self-pay | Admitting: Family Medicine

## 2009-10-04 ENCOUNTER — Encounter (INDEPENDENT_AMBULATORY_CARE_PROVIDER_SITE_OTHER): Payer: Self-pay | Admitting: *Deleted

## 2009-10-17 ENCOUNTER — Encounter: Payer: Self-pay | Admitting: Family Medicine

## 2009-11-10 LAB — CONVERTED CEMR LAB: Hgb A1c MFr Bld: 6.5 % — ABNORMAL HIGH (ref <5.7)

## 2009-11-15 ENCOUNTER — Ambulatory Visit: Payer: Self-pay | Admitting: Family Medicine

## 2009-11-16 ENCOUNTER — Encounter: Payer: Self-pay | Admitting: Family Medicine

## 2009-11-16 LAB — CONVERTED CEMR LAB
Creatinine, Urine: 98 mg/dL
Microalb Creat Ratio: 5.8 mg/g (ref 0.0–30.0)
Microalb, Ur: 0.57 mg/dL (ref 0.00–1.89)

## 2010-01-15 ENCOUNTER — Encounter: Payer: Self-pay | Admitting: Family Medicine

## 2010-01-28 ENCOUNTER — Encounter: Payer: Self-pay | Admitting: Family Medicine

## 2010-01-29 ENCOUNTER — Encounter: Payer: Self-pay | Admitting: General Surgery

## 2010-02-08 NOTE — Progress Notes (Signed)
Summary: CALL  Phone Note Call from Patient   Summary of Call: WOULD LIKE TO SPEAK TO YOU ABOUT THE SURGERY AND WONDERING WILL SHE HAVE ANOTHER MAMMOGRAM  SHE HASD SAID THAT SHE WOULD TALK O YOU AFTER YOU CAME BACK FROM VACATION SHE IS READY TO DO THIS PROCEDURE CALL BACK AT [phone] Initial call taken by: Lind Guest,  August 31, 2008 9:02 AM  Follow-up for Phone Call        pls sched appt with dr Lovell Sheehan to re-eval need for further breast surgery, pt has breast cancer which appears to be recurrent, first op was at [facility], she needs to have her surgery nearer home. let her know the date of appt. Tell he Dr. Lovell Sheehan will order [facility] if he thinks it necessary Follow-up by: Syliva Overman MD,  August 31, 2008 5:53 PM  Additional Follow-up for Phone Call Additional follow up Details #1::        pt has appt with dr. Lovell Sheehan on sept 9th 9:30. pt notified  Additional Follow-up by: Rudene Anda,  September 05, 2008 11:09 AM

## 2010-02-08 NOTE — Medication Information (Signed)
Summary: Tax adviser   Imported By: Lind Guest 03/01/2009 08:50:28  _____________________________________________________________________  External Attachment:    Type:   Image     Comment:   External Document
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
## 2010-02-08 NOTE — Progress Notes (Signed)
[facility]   Imported By: Lind Guest 10/20/2008 09:10:10  _____________________________________________________________________  External Attachment:    Type:   Image     Comment:   External Document

## 2010-02-08 NOTE — Assessment & Plan Note (Signed)
Summary: FLU SHOT  Nurse Visit   Vitals Entered By: Everitt Amber LPN (September 07, 2009 10:25 AM)   Allergies: 1)  ! Darvocet 2)  ! Feldene 3)  ! Seldane  Orders Added: 1)  Influenza Vaccine MCR [00025]   Influenza Vaccine    Vaccine Type: Fluvax MCR    Site: left deltoid    Mfr: novartis     Dose: 0.5 ml    Route: IM    Given by: Everitt Amber LPN    Exp. Date: 05/2010    Lot #: [id number]  Flu Vaccine Consent Questions    Do you have a history of severe allergic reactions to this vaccine? no    Any prior history of allergic reactions to egg and/or gelatin? no    Do you have a sensitivity to the preservative Thimersol? no    Do you have a past history of Guillan-Barre Syndrome? no    Do you currently have an acute febrile illness? no    Have you ever had a severe reaction to latex? no    Are you currently pregnant? no

## 2010-02-08 NOTE — Progress Notes (Signed)
[facility]   Imported By: Lind Guest 11/17/2008 15:44:01  _____________________________________________________________________  External Attachment:    Type:   Image     Comment:   External Document

## 2010-02-08 NOTE — Progress Notes (Signed)
Summary: [facility]   Imported By: Lind Guest 12/28/2008 09:22:58  _____________________________________________________________________  External Attachment:    Type:   Image     Comment:   External Document

## 2010-02-08 NOTE — Assessment & Plan Note (Signed)
Summary: office visit   Vital Signs:  Patient profile:   77 year old female Menstrual status:  hysterectomy Height:      63 inches Weight:      205.56 pounds BMI:     36.54 O2 Sat:      99 % Pulse rate:   55 / minute Pulse rhythm:   regular Resp:     16 per minute BP sitting:   130 / 80  (left arm) Cuff size:   large  Vitals Entered By: Everitt Amber (August 04, 2008 8:17 AM)  Nutrition Counseling: Patient's BMI is greater than 25 and therefore counseled on weight management options. CC: Follow up chronic problems Is Patient Diabetic? Yes   Primary Care Jamarcus Laduke:  Lodema Hong, M.D.  CC:  Follow up chronic problems.  History of Present Illness: Patient reports doing well.  Denies any recent fever or chills.  Denies any appetite change or change in bowel movements. Patient denies depression, anxiety or insomnia. Although Ms. Hollar has been recently diagnosed with recurrent breast Ca. she states since she feels well she has not decided on further surgery. She is also limited to the area since he spouse with severe dementia has considerably deteriorated, and he refuses help from anyone but Ms.Montez Morita.I spent a considerable amt of time encouraging her to rethink her position on surgery and even to consider a more radical approach locally.She denies poyuria , polydypsia or blurred vision. She tests her sugars regularly and her fasting sugars are seldom over 120. She denies joint pain or stiffness.  Allergies: 1)  ! Darvocet 2)  ! Feldene 3)  ! Seldane  Review of Systems      See HPI General:  See HPI. ENT:  Denies nasal congestion, sinus pressure, and sore throat. CV:  Denies chest pain or discomfort, palpitations, and swelling of feet. Resp:  Denies cough and sputum productive. GI:  Denies abdominal pain, constipation, diarrhea, nausea, and vomiting. GU:  Denies dysuria and urinary frequency. MS:  See HPI; Denies joint pain. Psych:  See HPI. Endo:  See HPI.  Physical  Exam  General:  alert, well-hydrated, and overweight-appearing. HEENT: No facial asymmetry,  EOMI, No sinus tenderness, TM's Clear, oropharynx  pink and moist.   Chest: Clear to auscultation bilaterally.  CVS: S1, S2, No murmurs, No S3.   Abd: Soft, Nontender.  MS: Adequate ROM spine, hips, shoulders and knees.  Ext: No edema.   CNS: CN 2-12 intact, power tone and sensation normal throughout.   Skin: Intact, no visible lesions or rashes.  Psych: Good eye contact, normal affect.  Memory intact, not anxious or depressed appearing.     Diabetes Management Exam:    Foot Exam (with socks and/or shoes not present):       Sensory-Monofilament:          Left foot: normal          Right foot: normal       Inspection:          Left foot: normal          Right foot: normal       Nails:          Left foot: normal          Right foot: normal   Impression & Recommendations:  Problem # 1:  ADENOCARCINOMA, BREAST, HX OF (ICD-V10.3) Assessment Deteriorated pt has recurrent disease, needs further treatment  Problem # 2:  GOUT, UNSPECIFIED (ICD-274.9) Assessment: Improved  Her updated  medication list for this problem includes:    Allopurinol 300 Mg Tabs (Allopurinol) .Marland Kitchen... Take 1 tablet by mouth once a day, no recent flares , need to recheck uric acid level if none in 3 months  Problem # 3:  OBESITY (ICD-278.00) Assessment: Improved  Ht: 63 (08/04/2008)   Wt: 205.56 (08/04/2008)   BMI: 36.54 (08/04/2008)  Problem # 4:  HYPERTENSION (ICD-401.9) Assessment: Improved  Her updated medication list for this problem includes:    Triamterene-hctz 75-50 Mg Tabs (Triamterene-hctz) .Marland Kitchen... Take 1 tablet by mouth once a day    Metoprolol Succinate 25 Mg Tb24 (Metoprolol succinate) .Marland Kitchen... Take 1 tablet by mouth once a day    Lasix 20 Mg Tabs (Furosemide) .Marland Kitchen... Take one half tablet by mouth daily as needed    Norvasc 5 Mg Tabs (Amlodipine besylate) .Marland Kitchen... Take 1 tablet by mouth once a day  BP today:  130/80 Prior BP: 142/80 (05/10/2008)  Labs Reviewed: K+: 4.6 (08/02/2008) Creat: : 0.67 (08/02/2008)   Chol: 233 (08/02/2008)   HDL: 56 (08/02/2008)   LDL: 146 (08/02/2008)   TG: 157 (08/02/2008)  Problem # 5:  DIABETES MELLITUS, TYPE II, CONTROLLED (ICD-250.00) Assessment: Unchanged  Her updated medication list for this problem includes:    Aspirin 81 Mg Tbec (Aspirin) .Marland Kitchen... Take 1 tablet by mouth once a day    Metformin Hcl 1000 Mg Tabs (Metformin hcl) .Marland Kitchen... Take 1 tablet by mouth two times a day  Orders: Glucose, (CBG) (82962) Hemoglobin A1C (83036)  Labs Reviewed: Creat: 0.67 (08/02/2008)    Reviewed HgBA1c results: 6.8 (08/04/2008)  6.8 (04/14/2008)  Complete Medication List: 1)  Aspirin 81 Mg Tbec (Aspirin) .... Take 1 tablet by mouth once a day 2)  Oscal 500/200 D-3 500-200 Mg-unit Tabs (Calcium-vitamin d) .... Take 1 tablet by mouth three times a day 3)  Triamterene-hctz 75-50 Mg Tabs (Triamterene-hctz) .... Take 1 tablet by mouth once a day 4)  Metoprolol Succinate 25 Mg Tb24 (Metoprolol succinate) .... Take 1 tablet by mouth once a day 5)  Metformin Hcl 1000 Mg Tabs (Metformin hcl) .... Take 1 tablet by mouth two times a day 6)  Allopurinol 300 Mg Tabs (Allopurinol) .... Take 1 tablet by mouth once a day 7)  Klor-con M20 20 Meq Tbcr (Potassium chloride crys cr) .... Take 1 tablet by mouth once a day 8)  Lasix 20 Mg Tabs (Furosemide) .... Take one half tablet by mouth daily as needed 9)  Norvasc 5 Mg Tabs (Amlodipine besylate) .... Take 1 tablet by mouth once a day 10)  Omeprazole 40 Mg Cpdr (Omeprazole) .... Take 1 capsule by mouth once a day 11)  Align Caps (Misc intestinal flora regulat) .... Take 1 capsule by mouth once a day 12)  Flonase 50 Mcg/act Susp (Fluticasone propionate) .... 2 puffs per nostril daily  Patient Instructions: 1)  Please schedule a follow-up appointment in 3 months. 2)  It is important that you exercise regularly at least 30 minutes 6 times a  week. If you develop chest pain, have severe difficulty breathing, or feel very tired , stop exercising immediately and seek medical attention. 3)  You need to lose weight. Consider a lower calorie diet and regular exercise. 4)  Your liver tests are much better. 5)  Pls continue to follow a low fat diet. 6)  Pls consider what we discussed about yourbreast disease.Call me if you decide on any changes to let me know. Prescriptions: KLOR-CON M20 20 MEQ  TBCR (POTASSIUM CHLORIDE CRYS  CR) Take 1 tablet by mouth once a day  #30.0 Each x 5   Entered by:   Worthy Keeler LPN   Authorized by:   Syliva Overman MD   Signed by:   Worthy Keeler LPN on 81/19/1478   Method used:   Electronically to        [facility] [address] [phone]* (retail)       [address] [place], [place]  [address]       Ph: [phone]       Fax: [phone]   RxID:   [id number]   Laboratory Results   Blood Tests   Date/Time Received: August 04, 2008  Date/Time Reported: August 04, 2008   Glucose (fasting): 103 mg/dL   (Normal Range: 59-563) HGBA1C: 6.8%   (Normal Range: Non-Diabetic - 3-6%   Control Diabetic - 6-8%)

## 2010-02-08 NOTE — Medication Information (Signed)
Summary: Tax adviser   Imported By: Lind Guest 03/31/2008 08:43:51  _____________________________________________________________________  External Attachment:    Type:   Image     Comment:   External Document

## 2010-02-08 NOTE — Progress Notes (Signed)
Summary: eye exam  eye exam   Imported By: Lind Guest 02/15/2008 07:59:01  _____________________________________________________________________  External Attachment:    Type:   Image     Comment:   External Document

## 2010-02-08 NOTE — Assessment & Plan Note (Signed)
Summary: check up   Vital Signs:  Patient Profile:   77 Years Old Female Height:     63 inches Weight:      204 pounds BMI:     36.27 Pulse rate:   57 / minute Resp:     16 per minute BP sitting:   120 / 70  (left arm)  Pt. in pain?   no  Vitals Entered By: Worthy Keeler LPN (June 29, 2007 8:02 AM)                  Chief Complaint:  check up.  History of Present Illness: Patient is improved since her last visit, though her home stressors are essentially unchanged. She has seen a GI specialist recentkly re transaminitis and has been advised to lose weight. She tests blood sugars daily and they range between 103 to 117. She c/o intermittent dyspepsia and reflux.Her caffeine intake is limited and she does not smoke. She denies any recent fever or chills.    Current Allergies: ! DARVOCET     Review of Systems  ENT      Denies earache, hoarseness, nasal congestion, sinus pressure, and sore throat.  CV      Denies chest pain or discomfort, fatigue, lightheadness, palpitations, shortness of breath with exertion, swelling of feet, and swelling of hands.  Resp      Denies cough, pleuritic, shortness of breath, sputum productive, and wheezing.  GI      Complains of abdominal pain and indigestion.      Denies constipation, diarrhea, nausea, and vomiting.  GU      Denies dysuria and urinary frequency.  MS      Denies joint pain, joint redness, and stiffness.  Psych      Denies anxiety, depression, irritability, suicidal thoughts/plans, and thoughts of violence.  Endo      Denies excessive thirst, heat intolerance, and polyuria.   Physical Exam  General:     overweight-appearing.   Head:     Normocephalic and atraumatic without obvious abnormalities. No apparent alopecia or balding. Eyes:     vision grossly intact.   Ears:     External ear exam shows no significant lesions or deformities.  Otoscopic examination reveals clear canals, tympanic membranes are  intact bilaterally without bulging, retraction, inflammation or discharge. Hearing is grossly normal bilaterally. Nose:     External nasal examination shows no deformity or inflammation. Nasal mucosa are pink and moist without lesions or exudates. Mouth:     fair dentition.   Neck:     No deformities, masses, or tenderness noted. Lungs:     Normal respiratory effort, chest expands symmetrically. Lungs are clear to auscultation, no crackles or wheezes. Heart:     Normal rate and regular rhythm. S1 and S2 normal without gallop, murmur, click, rub or other extra sounds. Abdomen:     soft and non-tender.   Extremities:     No clubbing, cyanosis, edema, or deformity noted with normal full range of motion of all joints.   Skin:     Intact without suspicious lesions or rashes    Impression & Recommendations:  Problem # 1:  DEGENERATIVE JOINT DISEASE, SPINE (ICD-721.90) Assessment: Improved  Problem # 2:  GOUT (ICD-274.9) Assessment: Improved  Her updated medication list for this problem includes:    Allopurinol 300 Mg Tabs (Allopurinol) .Marland Kitchen... Take 1 tablet by mouth once a day   Problem # 3:  GOUT (ICD-274.9)  Her updated medication list  for this problem includes:    Allopurinol 300 Mg Tabs (Allopurinol) .Marland Kitchen... Take 1 tablet by mouth once a day   Problem # 4:  OBESITY (ICD-278.00) Assessment: Deteriorated  Problem # 5:  HYPERTENSION (ICD-401.9) Assessment: Unchanged  Her updated medication list for this problem includes:    Triamterene-hctz 75-50 Mg Tabs (Triamterene-hctz) .Marland Kitchen... Take 1 tablet by mouth once a day    Metoprolol Succinate 25 Mg Tb24 (Metoprolol succinate) .Marland Kitchen... Take 1 tablet by mouth once a day    Lasix 20 Mg Tabs (Furosemide) .Marland Kitchen... Take one half tablet by mouth daily as needed    Norvasc 5 Mg Tabs (Amlodipine besylate) .Marland Kitchen... Take 1 tablet by mouth once a day   Problem # 6:  DIABETES MELLITUS, TYPE II, CONTROLLED (ICD-250.00) Assessment: Unchanged  Her  updated medication list for this problem includes:    Aspirin 81 Mg Tbec (Aspirin) .Marland Kitchen... Take 1 tablet by mouth once a day    Metformin Hcl 500 Mg Tabs (Metformin hcl) .Marland Kitchen... Take 1 tablet by mouth two times a day   Problem # 7:  GERD (ICD-530.81) Assessment: Deteriorated  Her updated medication list for this problem includes:    Omeprazole 20 Mg Tbec (Omeprazole) .Marland Kitchen... Take 1 tablet by mouth once a day   Complete Medication List: 1)  Aspirin 81 Mg Tbec (Aspirin) .... Take 1 tablet by mouth once a day 2)  Oscal 500/200 D-3 500-200 Mg-unit Tabs (Calcium-vitamin d) .... Take 1 tablet by mouth three times a day 3)  Triamterene-hctz 75-50 Mg Tabs (Triamterene-hctz) .... Take 1 tablet by mouth once a day 4)  Vytorin 10-40 Mg Tabs (Ezetimibe-simvastatin) .... Take 1 tablet by mouth once a day 5)  Metoprolol Succinate 25 Mg Tb24 (Metoprolol succinate) .... Take 1 tablet by mouth once a day 6)  Metformin Hcl 500 Mg Tabs (Metformin hcl) .... Take 1 tablet by mouth two times a day 7)  Allopurinol 300 Mg Tabs (Allopurinol) .... Take 1 tablet by mouth once a day 8)  Klor-con M20 20 Meq Tbcr (Potassium chloride crys cr) .... Take 1 tablet by mouth once a day 9)  Lasix 20 Mg Tabs (Furosemide) .... Take one half tablet by mouth daily as needed 10)  Norvasc 5 Mg Tabs (Amlodipine besylate) .... Take 1 tablet by mouth once a day 11)  Omeprazole 20 Mg Tbec (Omeprazole) .... Take 1 tablet by mouth once a day   Patient Instructions: 1)  It is important that you exercise regularly at least 20 minutes 5 times a week. If you develop chest pain, have severe difficulty breathing, or feel very tired , stop exercising immediately and seek medical attention. 2)  You need to lose weight. Consider a lower calorie diet and regular exercise.  3)  Check your Blood Pressure regularly. If it is above150/95  you should make an appointment. 4)  Check your blood sugars regularly. If your readings are usually above250: or  below 70 you should contact our office. 5)  You will start omeprazole as neede for heartburn. emember not to eat less than 3 hrs. before lying down. 6)  F/U 6 weeks. 7)  .   Prescriptions: OMEPRAZOLE 20 MG  TBEC (OMEPRAZOLE) Take 1 tablet by mouth once a day  #30 x 4   Entered by:   Chipper Herb   Authorized by:   Syliva Overman MD   Signed by:   Chipper Herb on 06/29/2007   Method used:   Handwritten   RxID:  [id number] METOPROLOL SUCCINATE 25 MG  TB24 (METOPROLOL SUCCINATE) Take 1 tablet by mouth once a day  #30 x 4   Entered by:   Chipper Herb   Authorized by:   Syliva Overman MD   Signed by:   Chipper Herb on 06/29/2007   Method used:   Handwritten   RxID:   [id number] ALLOPURINOL 300 MG  TABS (ALLOPURINOL) Take 1 tablet by mouth once a day  #30 x 4   Entered by:   Chipper Herb   Authorized by:   Syliva Overman MD   Signed by:   Chipper Herb on 06/29/2007   Method used:   Handwritten   RxID:   [id number]  ] Laboratory Results   Blood Tests   Date/Time Received: June 29, 2007 8:42 AM Date/Time Reported: June 29, 2007 8:42 AM  Glucose (random): 106 mg/dL   (Normal Range: 53-664)

## 2010-02-08 NOTE — Progress Notes (Signed)
Summary: LEFT BREAST DUCTAL CARCINOMA  LEFT BREAST DUCTAL CARCINOMA   Imported By: Lind Guest 03/09/2008 13:10:26  _____________________________________________________________________  External Attachment:    Type:   Image     Comment:   External Document

## 2010-02-08 NOTE — Assessment & Plan Note (Signed)
Summary: OV   Vital Signs:  Patient Profile:   77 Years Old Female Height:     63 inches (160.02 cm) Weight:      209 pounds (95.00 kg) BMI:     37.16 BSA:     1.97 O2 Sat:      97 % Pulse rate:   96 / minute Resp:     16 per minute BP sitting:   130 / 78  (left arm)  Vitals Entered By: Everitt Amber (February 09, 2008 10:01 AM)                 Chief Complaint:  For past 2 days right eye itching and burning and very red. Feels like sand in it.Marland Kitchen  History of Present Illness: Painful red eye with blurred vision for three  days, clear watery drainage, symptoms are only in the r eye. No fever or chills. Pt otherwise hasno new health concerns and has been doing well.     Current Allergies: ! DARVOCET ! FELDENE    Risk Factors:  Tobacco use:  never Drug use:  no Caffeine use:  0 drinks per day Alcohol use:  no Seatbelt use:  100 %   Review of Systems  General      Denies chills, fatigue, fever, loss of appetite, malaise, sleep disorder, sweats, weakness, and weight loss.  Eyes      See HPI  ENT      Denies earache, hoarseness, nasal congestion, sinus pressure, and sore throat.  CV      Denies chest pain or discomfort, palpitations, shortness of breath with exertion, and swelling of feet.  Resp      Denies cough, sputum productive, and wheezing.  GI      Denies abdominal pain, constipation, indigestion, nausea, and vomiting.  GU      Denies dysuria and urinary frequency.  MS      Denies joint pain and stiffness.  Derm      Denies itching, lesion(s), and rash.  Psych      Denies anxiety and depression.  Endo      Denies cold intolerance, excessive hunger, excessive thirst, excessive urination, heat intolerance, polyuria, and weight change.   Physical Exam  General:     HEENT: No facial asymmetry,  EOMI, No sinus tenderness, TM's Clear, oropharynx  pink and moist. conjunctival injection in r eye with erythema, no visible drainage, pt reports  blurring of vision  Chest: Clear to auscultation bilaterally.  CVS: S1, S2, No murmurs, No S3.   Abd: Soft, Nontender.  MS: Adequate ROM spine, hips, shoulders and knees.  Ext: No edema.   CNS: CN 2-12 intact, power tone and sensation normal throughout.   Skin: Intact, no visible lesions or rashes.  Psych: Good eye contact, normal effect.  Memory intact, not anxious or depressed appearing.     Impression & Recommendations:  Problem # 1:  CONJUNCTIVITIS, RIGHT (ICD-372.30) Assessment: Comment Only  Orders: Ophthalmology Referral (Ophthalmology)   Problem # 2:  DEGENERATIVE JOINT DISEASE, SPINE (ICD-721.90) Assessment: Improved  Problem # 3:  HYPERTENSION (ICD-401.9) Assessment: Unchanged  Her updated medication list for this problem includes:    Triamterene-hctz 75-50 Mg Tabs (Triamterene-hctz) .Marland Kitchen... Take 1 tablet by mouth once a day    Metoprolol Succinate 25 Mg Tb24 (Metoprolol succinate) .Marland Kitchen... Take 1 tablet by mouth once a day    Lasix 20 Mg Tabs (Furosemide) .Marland Kitchen... Take one half tablet by mouth daily as needed  Norvasc 5 Mg Tabs (Amlodipine besylate) .Marland Kitchen... Take 1 tablet by mouth once a day  BP today: 130/78 Prior BP: 118/70 (12/15/2007)  Labs Reviewed: Creat: 0.70 (12/15/2007) Chol: 229 (12/15/2007)   HDL: 58 (12/15/2007)   LDL: 146 (12/15/2007)   TG: 124 (12/15/2007)   Problem # 4:  DIABETES MELLITUS, TYPE II, CONTROLLED (ICD-250.00) Assessment: Comment Only  Her updated medication list for this problem includes:    Aspirin 81 Mg Tbec (Aspirin) .Marland Kitchen... Take 1 tablet by mouth once a day    Metformin Hcl 1000 Mg Tabs (Metformin hcl) .Marland Kitchen... Take 1 tablet by mouth two times a day  Orders: Glucose, (CBG) [phone])  Labs Reviewed: HgBA1c: 6.3 (12/15/2007)   Creat: 0.70 (12/15/2007)   Microalbumin: 0.41 (04/30/2007)   Complete Medication List: 1)  Aspirin 81 Mg Tbec (Aspirin) .... Take 1 tablet by mouth once a day 2)  Oscal 500/200 D-3 500-200 Mg-unit Tabs  (Calcium-vitamin d) .... Take 1 tablet by mouth three times a day 3)  Triamterene-hctz 75-50 Mg Tabs (Triamterene-hctz) .... Take 1 tablet by mouth once a day 4)  Metoprolol Succinate 25 Mg Tb24 (Metoprolol succinate) .... Take 1 tablet by mouth once a day 5)  Metformin Hcl 1000 Mg Tabs (Metformin hcl) .... Take 1 tablet by mouth two times a day 6)  Allopurinol 300 Mg Tabs (Allopurinol) .... Take 1 tablet by mouth once a day 7)  Klor-con M20 20 Meq Tbcr (Potassium chloride crys cr) .... Take 1 tablet by mouth once a day 8)  Lasix 20 Mg Tabs (Furosemide) .... Take one half tablet by mouth daily as needed 9)  Norvasc 5 Mg Tabs (Amlodipine besylate) .... Take 1 tablet by mouth once a day 10)  Omeprazole 40 Mg Cpdr (Omeprazole) .... Take 1 capsule by mouth once a day   Patient Instructions: 1)  f/U as before. 2)  You are being referred urgently to the eye specialist. 3)  No med changes. 4)  Please keep hands out of the affected eye and practice good hygiene with frequent washing.    Laboratory Results   Blood Tests   Date/Time Received: February 09, 2008 10am Date/Time Reported: February 09, 2008 10am  Glucose (random): 141 mg/dL   (Normal Range: 60-454)

## 2010-02-08 NOTE — [facility] Notes (Signed)
[facility]   Imported By: Lind Guest 07/04/2009 12:58:30  _____________________________________________________________________  External Attachment:    Type:   Image     Comment:   External Document

## 2010-02-08 NOTE — Letter (Signed)
Summary: Historic Patient File  Historic Patient File   Imported By: Jacklynn Ganong 09/17/2007 10:52:33  _____________________________________________________________________  External Attachment:    Type:   Image     Comment:   history form

## 2010-02-08 NOTE — Assessment & Plan Note (Signed)
Summary: office visit   Vital Signs:  Patient profile:   77 year old female Menstrual status:  hysterectomy Height:      63 inches Weight:      190.75 pounds BMI:     33.91 O2 Sat:      98 % on Room air Pulse rate:   93 / minute Pulse rhythm:   regular Resp:     16 per minute BP sitting:   124 / 78  (left arm)  Vitals Entered By: Mauricia Area CMA (November 15, 2009 3:08 PM)  Nutrition Counseling: Patient's BMI is greater than 25 and therefore counseled on weight management options.  O2 Flow:  Room air CC: follow up   Primary Care Provider:  Lodema Hong  CC:  follow up.  History of Present Illness: Reports  that she has been doing fairly well.. Denies recent fever or chills. Denies sinus pressure, nasal congestion , ear pain or sore throat. Denies chest congestion, or cough productive of sputum. Denies chest pain, palpitations, PND, orthopnea or leg swelling. Denies abdominal pain, nausea, vomitting, diarrhea or constipation. Denies change in bowel movements or bloody stool. Denies dysuria , frequency, incontinence or hesitancy. Denies  joint pain, swelling, or reduced mobility. Denies headaches, vertigo, seizures. Denies depression, anxiety or insomnia. Denies  rash, lesions, or itch.     Current Medications (verified): 1)  Aspirin 81 Mg  Tbec (Aspirin) .... Take 1 Tablet By Mouth Once A Day 2)  Triamterene-Hctz 75-50 Mg  Tabs (Triamterene-Hctz) .... Take 1 Tablet By Mouth Once A Day 3)  Metoprolol Succinate 25 Mg  Tb24 (Metoprolol Succinate) .... Take 1 Tablet By Mouth Once A Day 4)  Metformin Hcl 1000 Mg  Tabs (Metformin Hcl) .... Take 1 Tablet By Mouth Two Times A Day 5)  Allopurinol 300 Mg  Tabs (Allopurinol) .... Take 1 Tablet By Mouth Once A Day 6)  Klor-Con M20 20 Meq  Tbcr (Potassium Chloride Crys Cr) .... Take 1 Tablet By Mouth Once A Day 7)  Lasix 20 Mg  Tabs (Furosemide) .... Take One Half Tablet By Mouth Daily As Needed 8)  Norvasc 5 Mg  Tabs (Amlodipine  Besylate) .... Take 1 Tablet By Mouth Once A Day 9)  Omeprazole 40 Mg Cpdr (Omeprazole) .... Take 1 Capsule By Mouth Once A Day 10)  Flonase 50 Mcg/act Susp (Fluticasone Propionate) .... 2 Puffs Per Nostril Daily 11)  Risaquad  Caps (Probiotic Product) .... Take 1 Tablet By Mouth Once A Day  Allergies (verified): 1)  ! Darvocet 2)  ! Feldene 3)  ! Seldane  Review of Systems      See HPI General:  Complains of fatigue. Eyes:  Denies discharge and red eye. Endo:  Denies cold intolerance, excessive hunger, excessive thirst, and heat intolerance; tests dailyand blood sugars are within range . Heme:  Denies abnormal bruising and bleeding. Allergy:  Complains of seasonal allergies.  Physical Exam  General:  Well-developed,well-nourished,in no acute distress; alert,appropriate and cooperative throughout examination HEENT: No facial asymmetry,  EOMI, No sinus tenderness, TM's Clear, oropharynx  pink and moist.   Chest: Clear to auscultation bilaterally.  CVS: S1, S2, No murmurs, No S3.   Abd: Soft, Nontender.  MS: Adequate ROM spine, hips, shoulders and knees.  Ext: No edema.   CNS: CN 2-12 intact, power tone and sensation normal throughout.   Skin: Intact, no visible lesions or rashes.Hyperpigmented nail beds  Psych: Good eye contact, normal affect.  Memory intact, not anxious or depressed  appearing.   Diabetes Management Exam:    Foot Exam (with socks and/or shoes not present):       Sensory-Monofilament:          Left foot: diminished          Right foot: diminished       Inspection:          Left foot: normal          Right foot: normal       Nails:          Left foot: thickened          Right foot: thickened   Impression & Recommendations:  Problem # 1:  HYPERTENSION (ICD-401.9) Assessment Unchanged  Her updated medication list for this problem includes:    Triamterene-hctz 75-50 Mg Tabs (Triamterene-hctz) .Marland Kitchen... Take 1 tablet by mouth once a day    Metoprolol  Succinate 25 Mg Tb24 (Metoprolol succinate) .Marland Kitchen... Take 1 tablet by mouth once a day    Lasix 20 Mg Tabs (Furosemide) .Marland Kitchen... Take one half tablet by mouth daily as needed    Norvasc 5 Mg Tabs (Amlodipine besylate) .Marland Kitchen... Take 1 tablet by mouth once a day  Orders: T-Basic Metabolic Panel [phone])  BP today: 124/78 Prior BP: 120/68 (09/15/2009)  Labs Reviewed: K+: 4.3 (04/18/2009) Creat: : 0.60 (04/18/2009)   Chol: 222 (07/13/2009)   HDL: 49 (07/13/2009)   LDL: 146 (07/13/2009)   TG: 136 (07/13/2009)  Problem # 2:  HYPERLIPIDEMIA (ICD-272.4) Assessment: Deteriorated  Orders: T-Lipid Profile [phone]) T-Hepatic Function [phone])  Labs Reviewed: SGOT: 38 (03/08/2009)   SGPT: 34 (03/08/2009)   HDL:49 (07/13/2009), 48 (04/13/2009)  LDL:146 (07/13/2009), 133 (04/13/2009)  Chol:222 (07/13/2009), 203 (04/13/2009)  Trig:136 (07/13/2009), 112 (04/13/2009)  Problem # 3:  DIABETES MELLITUS, TYPE II, CONTROLLED (ICD-250.00) Assessment: Improved  Her updated medication list for this problem includes:    Aspirin 81 Mg Tbec (Aspirin) .Marland Kitchen... Take 1 tablet by mouth once a day    Metformin Hcl 1000 Mg Tabs (Metformin hcl) .Marland Kitchen... Take 1 tablet by mouth two times a day  Orders: T-Urine Microalbumin w/creat. ratio [id number])  Labs Reviewed: Creat: 0.60 (04/18/2009)    Reviewed HgBA1c results: 6.5 (11/10/2009)  6.7 (07/13/2009)  Complete Medication List: 1)  Aspirin 81 Mg Tbec (Aspirin) .... Take 1 tablet by mouth once a day 2)  Triamterene-hctz 75-50 Mg Tabs (Triamterene-hctz) .... Take 1 tablet by mouth once a day 3)  Metoprolol Succinate 25 Mg Tb24 (Metoprolol succinate) .... Take 1 tablet by mouth once a day 4)  Metformin Hcl 1000 Mg Tabs (Metformin hcl) .... Take 1 tablet by mouth two times a day 5)  Allopurinol 300 Mg Tabs (Allopurinol) .... Take 1 tablet by mouth once a day 6)  Klor-con M20 20 Meq Tbcr (Potassium chloride crys cr) .... Take 1 tablet by mouth once a  day 7)  Lasix 20 Mg Tabs (Furosemide) .... Take one half tablet by mouth daily as needed 8)  Norvasc 5 Mg Tabs (Amlodipine besylate) .... Take 1 tablet by mouth once a day 9)  Omeprazole 40 Mg Cpdr (Omeprazole) .... Take 1 capsule by mouth once a day 10)  Flonase 50 Mcg/act Susp (Fluticasone propionate) .... 2 puffs per nostril daily 11)  Risaquad Caps (Probiotic product) .... Take 1 tablet by mouth once a day  Other Orders: Medicare Electronic Prescription [id number]) T-CBC w/Diff ([id number]) T-Uric Acid (Blood) [id number])  Patient Instructions: 1)  CPE in 4 months. 2)  I am hppy that  you are doing well. 3)  BMP prior to visit, ICD-9: 4)  Hepatic Panel prior to visit, ICD-9: 5)  Lipid Panel prior to visit, ICD-9:  fasting in 4 months. 6)  CBC w/ Diff prior to visit, ICD-9: 7)  HbgA1C prior to visit, ICD-9: 8)  No med changes at this time. Prescriptions: RISAQUAD  CAPS (PROBIOTIC PRODUCT) Take 1 tablet by mouth once a day  #30 Each x 3   Entered by:   Adella Hare LPN   Authorized by:   Syliva Overman MD   Signed by:   Adella Hare LPN on 16/10/9602   Method used:   Electronically to        [facility] [address] [phone]* (retail)       [address] [place], [place]  [address]       Ph: [phone]       Fax: [phone]   RxID:   [id number] METFORMIN HCL 1000 MG  TABS (METFORMIN HCL) Take 1 tablet by mouth two times a day  #60 Each x 3   Entered by:   Adella Hare LPN   Authorized by:   Syliva Overman MD   Signed by:   Adella Hare LPN on 53/66/4403   Method used:   Electronically to        [facility] [address] [phone]* (retail)       [address] [place], [place]  [address]       Ph: [phone]       Fax: [phone]   RxID:   [id number]    Orders Added: 1)  Est. Patient Level IV [25427] 2)  Medicare Electronic Prescription [G8553] 3)  T-Basic Metabolic Panel [phone] 4)  T-Lipid Profile [[id number]] 5)  T-Hepatic Function  [80076-22960] 6)  T-CBC w/Diff [[id number]] 7)  T-Uric Acid (Blood) [[id number]] 8)  T-Urine Microalbumin w/creat. ratio [[id number]]

## 2010-02-08 NOTE — Progress Notes (Signed)
Summary: DR. MCNATT  DR. MCNATT   Imported By: Lind Guest 06/13/2008 10:30:34  _____________________________________________________________________  External Attachment:    Type:   Image     Comment:   External Document

## 2010-02-08 NOTE — Progress Notes (Signed)
Summary: eye exam  eye exam   Imported By: Lind Guest 04/25/2008 11:16:47  _____________________________________________________________________  External Attachment:    Type:   Image     Comment:   External Document
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
## 2010-02-08 NOTE — Progress Notes (Signed)
[facility]   Imported By: Lind Guest 03/20/2009 16:01:53  _____________________________________________________________________  External Attachment:    Type:   Image     Comment:   External Document

## 2010-02-08 NOTE — Progress Notes (Signed)
Summary: blood work  Phone Note Call from Patient   Summary of Call: wants to know if she has to do blood work again. [phone] Initial call taken by: Rudene Anda,  January 03, 2009 4:08 PM  Follow-up for Phone Call        pls advise and order ASt and ALT to be done next week, non fasting. Also let her know i heard from [facility] and I strongly recommend that she takes thechemo suggested, and that I am thinkingabout her Follow-up by: Syliva Overman MD,  January 04, 2009 12:18 AM  Additional Follow-up for Phone Call Additional follow up Details #1::        patient aware, test ordered Additional Follow-up by: Worthy Keeler LPN,  January 04, 2009 8:38 AM

## 2010-02-08 NOTE — Progress Notes (Signed)
[facility]   Imported By: Lind Guest 04/14/2009 16:02:57  _____________________________________________________________________  External Attachment:    Type:   Image     Comment:   External Document

## 2010-02-08 NOTE — Progress Notes (Signed)
[facility]   Imported By: Lind Guest 01/27/2009 11:24:19  _____________________________________________________________________  External Attachment:    Type:   Image     Comment:   External Document

## 2010-02-08 NOTE — Assessment & Plan Note (Signed)
Summary: RT KNEE PAIN/NO XR BRINGING MRI FROM [facility]...   Vital Signs:  Patient Profile:   77 Years Old Female Height:     63 inches Weight:      200 pounds Pulse rate:   68 / minute Resp:     16 per minute  Vitals Entered By: Fuller Canada MD (September 09, 2007 9:11 AM)                 Chief Complaint:  right knee pain.  History of Present Illness: I saw Lisa Mann in the office today for an initial visit.  She is a 77 years old woman with the complaint of:  right knee pain, referral [facility].  MRI [facility] 08-18-07.  Will get xrays today in office.  Pain for 3 weeks, no injury.  No treatment.  No swelling, feels weak sometimes.  Some pain behind knee.  locking  clicking popping swelling injury date surgery meds mri xray     Current Allergies: ! DARVOCET   Social History:    Retired    Never Smoked    Alcohol use-no    Drug use-no    Patient is married.    Risk Factors:  Caffeine use:  0 drinks per day   Review of Systems  General      Denies weight loss, weight gain, fever, chills, and fatigue.  Cardiac      Denies chest pain, angina, heart attack, heart failure, poor circulation, blood clots, and phlebitis.  Resp      Denies short of breath, difficulty breathing, COPD, cough, and pneumonia.  GI      Complains of reflux.      Denies nausea, vomiting, diarrhea, constipation, difficulty swallowing, ulcers, and GERD.  GU      Denies kidney failure, kidney transplant, kidney stones, burning, poor stream, testicular cancer, blood in urine, and .  Neuro      Denies headache, dizziness, migraines, numbness, weakness, tremor, and unsteady walking.  MS      Complains of gout.      Denies joint pain, rheumatoid arthritis, joint swelling, bone cancer, osteoporosis, and .  Endo      Denies thyroid disease, goiter, and diabetes.  Psych      Denies depression, mood swings, anxiety, panic attack, bipolar, and  schizophrenia.  Derm      Denies eczema, cancer, and itching.  EENT      Denies poor vision, cataracts, glaucoma, poor hearing, vertigo, ears ringing, sinusitis, hoarseness, toothaches, and bleeding gums.  Immunology      Denies seasonal allergies, sinus problems, and allergic to bee stings.  Lymphatic      Denies lymph node cancer and lymph edema.   Physical Exam  Constitutional: vital signs see recorded values. General: normal development, nutrition, and grooming. No deformity. Body Habitus is medium. CDV: Observation and palpation was normal  Lymph: palpation of the lymph nodes were normal Skin: inspection and palpation of the skin revealed no abnormalities  Neuro: coordination: normal              DTR's normal              Sensation was normal  Psyche: Alert and oriented x 3. Mood was normal.  Affect: normal  MSK: Gait: normal   UE:  The upper extremities have normal appearance, ROM, strength and stability. LE:  The lower extrmities show normal appearance, strength and stability; the right and left knee are non tender with  120 ROM and normal ligaments     Impression & Recommendations:  Problem # 1:  KNEE, ARTHRITIS, DEGEN./OSTEO (ICD-715.96) Assessment: New MRI [facility]-Arthritis of the knee   Her updated medication list for this problem includes:    Aspirin 81 Mg Tbec (Aspirin) .Marland Kitchen... Take 1 tablet by mouth once a day  Orders: Knee x-ray,  3 views ([address]109)  Mild joint space narrowing and mild spurs   IMPR: mild OA   Right now mild symptoms and mild findings on imaging no need for surgery may need in the future    Other Orders: New Patient Level III (60454)   Patient Instructions: 1)  Walk for exercise  2)  Keep weight down 3)  return as needed    ]

## 2010-02-08 NOTE — Assessment & Plan Note (Signed)
Summary: F UP   Vital Signs:  Patient profile:   77 year old female Menstrual status:  hysterectomy Height:      63 inches Weight:      190 pounds BMI:     33.78 O2 Sat:      96 % Pulse rate:   81 / minute Pulse rhythm:   regular Resp:     16 per minute BP sitting:   120 / 68  (left arm) Cuff size:   large  Vitals Entered By: Everitt Amber LPN (September 15, 2009 8:45 AM)  Nutrition Counseling: Patient's BMI is greater than 25 and therefore counseled on weight management options. CC: Follow up chronic problems   Primary Care Provider:  Lodema Hong  CC:  Follow up chronic problems.  History of Present Illness: Reports  that she is doing fairly well. She now has her prosthesis, and is getting more help for here spouse who shge desperately wants to keep at home. Denies recent fever or chills. Denies sinus pressure, nasal congestion , ear pain or sore throat. Denies chest congestion, or cough productive of sputum. Denies chest pain, palpitations, PND, orthopnea or leg swelling. Denies abdominal pain, nausea, vomitting, diarrhea or constipation. Denies change in bowel movements or bloody stool. Denies dysuria , frequency, incontinence or hesitancy. Denies  joint pain, swelling, or reduced mobility. Denies headaches, vertigo, seizures. Denies depression, anxiety or insomnia. Denies  rash, lesions, or itch. she denies polyuriA, POLYDYPSIA OR BLURRED VISION, SHE DOES TEST HER SUGARS DAILY.     Current Medications (verified): 1)  Aspirin 81 Mg  Tbec (Aspirin) .... Take 1 Tablet By Mouth Once A Day 2)  Oscal 500/200 D-3 500-200 Mg-Unit  Tabs (Calcium-Vitamin D) .... Take 1 Tablet By Mouth Three Times A Day 3)  Triamterene-Hctz 75-50 Mg  Tabs (Triamterene-Hctz) .... Take 1 Tablet By Mouth Once A Day 4)  Metoprolol Succinate 25 Mg  Tb24 (Metoprolol Succinate) .... Take 1 Tablet By Mouth Once A Day 5)  Metformin Hcl 1000 Mg  Tabs (Metformin Hcl) .... Take 1 Tablet By Mouth Two Times A  Day 6)  Allopurinol 300 Mg  Tabs (Allopurinol) .... Take 1 Tablet By Mouth Once A Day 7)  Klor-Con M20 20 Meq  Tbcr (Potassium Chloride Crys Cr) .... Take 1 Tablet By Mouth Once A Day 8)  Lasix 20 Mg  Tabs (Furosemide) .... Take One Half Tablet By Mouth Daily As Needed 9)  Norvasc 5 Mg  Tabs (Amlodipine Besylate) .... Take 1 Tablet By Mouth Once A Day 10)  Omeprazole 40 Mg Cpdr (Omeprazole) .... Take 1 Capsule By Mouth Once A Day 11)  Flonase 50 Mcg/act Susp (Fluticasone Propionate) .... 2 Puffs Per Nostril Daily 12)  Risaquad  Caps (Probiotic Product) .... Take 1 Tablet By Mouth Once A Day  Allergies (verified): 1)  ! Darvocet 2)  ! Feldene 3)  ! Seldane  Review of Systems      See HPI General:  Complains of fatigue. Eyes:  Denies discharge, eye pain, and red eye. Neuro:  Complains of numbness and tingling; tingling and burning opain in fingers and toes chemp persits, but she is aware that this mAY LINGER FOR A WHILE. Endo:  Denies excessive thirst and excessive urination. Heme:  Denies abnormal bruising and bleeding. Allergy:  Complains of seasonal allergies; denies hives or rash.  Physical Exam  General:  Well-developed,well-nourished,in no acute distress; alert,appropriate and cooperative throughout examination HEENT: No facial asymmetry,  EOMI, No sinus tenderness, TM's  Clear, oropharynx  pink and moist.   Chest: Clear to auscultation bilaterally.  CVS: S1, S2, No murmurs, No S3.   Abd: Soft, Nontender.  MS: Adequate ROM spine, hips, shoulders and knees.  Ext: No edema.   CNS: CN 2-12 intact, power tone and sensation normal throughout.   Skin: Intact, no visible lesions or rashes.Hyperpigmented nail beds  Psych: Good eye contact, normal affect.  Memory intact, not anxious or depressed appearing.   Diabetes Management Exam:    Foot Exam (with socks and/or shoes not present):       Sensory-Monofilament:          Left foot: diminished          Right foot: diminished        Inspection:          Left foot: normal          Right foot: normal       Nails:          Left foot: normal          Right foot: normal   Impression & Recommendations:  Problem # 1:  GERD (ICD-530.81) Assessment Improved  Her updated medication list for this problem includes:    Omeprazole 40 Mg Cpdr (Omeprazole) .Marland Kitchen... Take 1 capsule by mouth once a day  Problem # 2:  DIABETES MELLITUS, TYPE II, CONTROLLED (ICD-250.00) Assessment: Improved  Her updated medication list for this problem includes:    Aspirin 81 Mg Tbec (Aspirin) .Marland Kitchen... Take 1 tablet by mouth once a day    Metformin Hcl 1000 Mg Tabs (Metformin hcl) .Marland Kitchen... Take 1 tablet by mouth two times a day  Orders: T- Hemoglobin A1C ([id number])  Labs Reviewed: Creat: 0.60 (04/18/2009)    Reviewed HgBA1c results: 6.7 (07/13/2009)  7.3 (04/13/2009)  Problem # 3:  HYPERTENSION (ICD-401.9) Assessment: Unchanged  Her updated medication list for this problem includes:    Triamterene-hctz 75-50 Mg Tabs (Triamterene-hctz) .Marland Kitchen... Take 1 tablet by mouth once a day    Metoprolol Succinate 25 Mg Tb24 (Metoprolol succinate) .Marland Kitchen... Take 1 tablet by mouth once a day    Lasix 20 Mg Tabs (Furosemide) .Marland Kitchen... Take one half tablet by mouth daily as needed    Norvasc 5 Mg Tabs (Amlodipine besylate) .Marland Kitchen... Take 1 tablet by mouth once a day  BP today: 120/68 Prior BP: 120/60 (07/13/2009)  Labs Reviewed: K+: 4.3 (04/18/2009) Creat: : 0.60 (04/18/2009)   Chol: 222 (07/13/2009)   HDL: 49 (07/13/2009)   LDL: 146 (07/13/2009)   TG: 136 (07/13/2009)  Problem # 4:  HYPERLIPIDEMIA (ICD-272.4) Assessment: Deteriorated  The following medications were removed from the medication list:    Zetia 10 Mg Tabs (Ezetimibe) .Marland Kitchen... Take 1 tab by mouth at bedtime  Labs Reviewed: SGOT: 38 (03/08/2009)   SGPT: 34 (03/08/2009)   HDL:49 (07/13/2009), 48 (04/13/2009)  LDL:146 (07/13/2009), 133 (04/13/2009)  Chol:222 (07/13/2009), 203 (04/13/2009)  Trig:136  (07/13/2009), 112 (04/13/2009) Low fat diet discussed and encouraged, and patient given low fat diet information also.  Complete Medication List: 1)  Aspirin 81 Mg Tbec (Aspirin) .... Take 1 tablet by mouth once a day 2)  Oscal 500/200 D-3 500-200 Mg-unit Tabs (Calcium-vitamin d) .... Take 1 tablet by mouth three times a day 3)  Triamterene-hctz 75-50 Mg Tabs (Triamterene-hctz) .... Take 1 tablet by mouth once a day 4)  Metoprolol Succinate 25 Mg Tb24 (Metoprolol succinate) .... Take 1 tablet by mouth once a day 5)  Metformin Hcl 1000 Mg Tabs (  Metformin hcl) .... Take 1 tablet by mouth two times a day 6)  Allopurinol 300 Mg Tabs (Allopurinol) .... Take 1 tablet by mouth once a day 7)  Klor-con M20 20 Meq Tbcr (Potassium chloride crys cr) .... Take 1 tablet by mouth once a day 8)  Lasix 20 Mg Tabs (Furosemide) .... Take one half tablet by mouth daily as needed 9)  Norvasc 5 Mg Tabs (Amlodipine besylate) .... Take 1 tablet by mouth once a day 10)  Omeprazole 40 Mg Cpdr (Omeprazole) .... Take 1 capsule by mouth once a day 11)  Flonase 50 Mcg/act Susp (Fluticasone propionate) .... 2 puffs per nostril daily 12)  Risaquad Caps (Probiotic product) .... Take 1 tablet by mouth once a day  Patient Instructions: 1)  Please schedule a follow-up appointment in 2 months. 2)  It is important that you exercise regularly at least 20 minutes 5 times a week. If you develop chest pain, have severe difficulty breathing, or feel very tired , stop exercising immediately and seek medical attention. 3)  You need to lose weight. Consider a lower calorie diet and regular exercise.  4)  HbgA1C prior to visit, ICD-9:  in 2 months 5)  I am thankful that you are so much better Prescriptions: ALLOPURINOL 300 MG  TABS (ALLOPURINOL) Take 1 tablet by mouth once a day  #30 x 4   Entered by:   Everitt Amber LPN   Authorized by:   Syliva Overman MD   Signed by:   Everitt Amber LPN on 16/10/9602   Method used:   Electronically  to        [facility] [address] [phone]* (retail)       [address] [place], [place]  [address]       Ph: [phone]       Fax: [phone]   RxID:   [id number] NORVASC 5 MG  TABS (AMLODIPINE BESYLATE) Take 1 tablet by mouth once a day  #30 x 4   Entered by:   Everitt Amber LPN   Authorized by:   Syliva Overman MD   Signed by:   Everitt Amber LPN on 53/66/4403   Method used:   Electronically to        [facility] [address] [phone]* (retail)       [address] [place], [place]  [address]       Ph: [phone]       Fax: [phone]   RxID:   [id number] KLOR-CON M20 20 MEQ  TBCR (POTASSIUM CHLORIDE CRYS CR) Take 1 tablet by mouth once a day  #30 Each x 4   Entered by:   Everitt Amber LPN   Authorized by:   Syliva Overman MD   Signed by:   Everitt Amber LPN on 25/42/7062   Method used:   Electronically to        [facility] [address] [phone]* (retail)       [address] [place]  [address]       Ph: [phone]       Fax: [phone]   RxID:   [id number] METOPROLOL SUCCINATE 25 MG  TB24 (METOPROLOL SUCCINATE) Take 1 tablet by mouth once a day  #30.0 Each x 4   Entered by:   Everitt Amber LPN   Authorized by:   Syliva Overman MD   Signed by:   Merry Proud  Hudy LPN on 04/54/0981   Method used:   Electronically to        [facility]. [phone]* (retail)       [address], [place]  [address]       Ph: [phone]       Fax: [phone]   RxID:   [id number]

## 2010-02-08 NOTE — Progress Notes (Signed)
Summary: [facility]   Imported By: Lind Guest 10/27/2008 14:49:20  _____________________________________________________________________  External Attachment:    Type:   Image     Comment:   External Document

## 2010-02-08 NOTE — Progress Notes (Signed)
Summary: referral  Phone Note Other Incoming   Caller: dr simpson Summary of Call: pls call Lisa Mann, let her know I was in touch with Dr Darrick Penna, gI about her abn liver enzymes, she needs an appt set up wih her for further eval, pls send most recent labs and get a date for Lisa Mann, or let her know dr. Evelina Dun office will call pls Initial call taken by: Syliva Overman MD,  December 13, 2008 8:45 PM  Follow-up for Phone Call        pt was referred to dr. fields office. They will call her with appt. pt notified Follow-up by: Rudene Anda,  December 14, 2008 9:08 AM

## 2010-02-08 NOTE — Progress Notes (Signed)
Summary: [facility]   Imported By: Lind Guest 02/28/2009 09:22:25  _____________________________________________________________________  External Attachment:    Type:   Image     Comment:   External Document

## 2010-02-08 NOTE — Assessment & Plan Note (Signed)
Summary: office visit   Vital Signs:  Patient profile:   77 year old female Menstrual status:  hysterectomy Height:      63 inches Weight:      194 pounds BMI:     34.49 O2 Sat:      94 % Pulse rate:   74 / minute Pulse rhythm:   regular Resp:     16 per minute BP sitting:   120 / 80 Cuff size:   regular  Vitals Entered By: Everitt Amber (February 02, 2009 2:35 PM)  Nutrition Counseling: Patient's BMI is greater than 25 and therefore counseled on weight management options. CC: Follow up chronic problems Is Patient Diabetic? Yes   Primary Care Provider:  Lodema Hong, M.D.  CC:  Follow up chronic problems.  History of Present Illness: Reports  that she is generally doing as wellas to be expected, having recently been diagnosed with bilateral breast cancer and currently undergoing chemotherapy. She does have her good days and bad days, but overall is maintining a positive attitude. Denies recent fever or chills. Denies sinus pressure, nasal congestion , ear pain or sore throat. Denies chest congestion, or cough productive of sputum. Denies chest pain, palpitations, PND, orthopnea or leg swelling. Denies abdominal pain, nausea, vomitting, diarrhea or constipation. Denies change in bowel movements or bloody stool. Denies dysuria , frequency, incontinence or hesitancy. Denies  joint pain, swelling, or reduced mobility. Denies headaches, vertigo, seizures. Denies depression, anxiety or insomnia. Denies  rash, lesions, or itch.     Current Medications (verified): 1)  Aspirin 81 Mg  Tbec (Aspirin) .... Take 1 Tablet By Mouth Once A Day 2)  Oscal 500/200 D-3 500-200 Mg-Unit  Tabs (Calcium-Vitamin D) .... Take 1 Tablet By Mouth Three Times A Day 3)  Triamterene-Hctz 75-50 Mg  Tabs (Triamterene-Hctz) .... Take 1 Tablet By Mouth Once A Day 4)  Metoprolol Succinate 25 Mg  Tb24 (Metoprolol Succinate) .... Take 1 Tablet By Mouth Once A Day 5)  Metformin Hcl 1000 Mg  Tabs (Metformin Hcl)  .... Take 1 Tablet By Mouth Two Times A Day 6)  Allopurinol 300 Mg  Tabs (Allopurinol) .... Take 1 Tablet By Mouth Once A Day 7)  Klor-Con M20 20 Meq  Tbcr (Potassium Chloride Crys Cr) .... Take 1 Tablet By Mouth Once A Day 8)  Lasix 20 Mg  Tabs (Furosemide) .... Take One Half Tablet By Mouth Daily As Needed 9)  Norvasc 5 Mg  Tabs (Amlodipine Besylate) .... Take 1 Tablet By Mouth Once A Day 10)  Omeprazole 40 Mg Cpdr (Omeprazole) .... Take 1 Capsule By Mouth Once A Day 11)  Align  Caps (Misc Intestinal Flora Regulat) .... Take 1 Capsule By Mouth Once A Day 12)  Flonase 50 Mcg/act Susp (Fluticasone Propionate) .... 2 Puffs Per Nostril Daily 13)  Risaquad  Caps (Probiotic Product) .... Take 1 Tablet By Mouth Once A Day  Allergies (verified): 1)  ! Darvocet 2)  ! Feldene 3)  ! Seldane  Review of Systems      See HPI Eyes:  Denies discharge, itching, and red eye. Endo:  Denies cold intolerance, excessive hunger, excessive thirst, excessive urination, heat intolerance, polyuria, and weight change; fasting blood sugars are seldom over 120. Heme:  Denies abnormal bruising and bleeding. Allergy:  Denies hives or rash, itching eyes, and sneezing.  Physical Exam  General:  Well-developed,well-nourished,in no acute distress; alert,appropriate and cooperative throughout examination HEENT: No facial asymmetry,  EOMI, No sinus tenderness, TM's Clear, oropharynx  pink and moist.   Chest: Clear to auscultation bilaterally.  CVS: S1, S2, No murmurs, No S3.   Abd: Soft, Nontender.  MS: Adequate ROM spine, hips, shoulders and knees.  Ext: No edema.   CNS: CN 2-12 intact, power tone and sensation normal throughout.   Skin: Intact, no visible lesions or rashes.  Psych: Good eye contact, normal affect.  Memory intact, not anxious or depressed appearing.    Impression & Recommendations:  Problem # 1:  LIVER FUNCTION TESTS, ABNORMAL, HX OF (ICD-V12.2) Assessment Comment Only recurrent elevation of  lFT's contact made with GI who counselled against use of a statinI  Problem # 2:  ADENOCARCINOMA, BREAST, HX OF (ICD-V10.3) Assessment: Comment Only pt currently undergoing chemo after bilateral mastectomy for recurrent ds  Problem # 3:  OBESITY (ICD-278.00) Assessment: Improved  Ht: 63 (02/02/2009)   Wt: 194 (02/02/2009)   BMI: 34.49 (02/02/2009)  Problem # 4:  HYPERTENSION (ICD-401.9) Assessment: Unchanged  Her updated medication list for this problem includes:    Triamterene-hctz 75-50 Mg Tabs (Triamterene-hctz) .Marland Kitchen... Take 1 tablet by mouth once a day    Metoprolol Succinate 25 Mg Tb24 (Metoprolol succinate) .Marland Kitchen... Take 1 tablet by mouth once a day    Lasix 20 Mg Tabs (Furosemide) .Marland Kitchen... Take one half tablet by mouth daily as needed    Norvasc 5 Mg Tabs (Amlodipine besylate) .Marland Kitchen... Take 1 tablet by mouth once a day  BP today: 120/80 Prior BP: 120/80 (12/21/2008)  Labs Reviewed: K+: 4.6 (08/02/2008) Creat: : 0.67 (08/02/2008)   Chol: 258 (11/30/2008)   HDL: 48 (11/30/2008)   LDL: 183 (11/30/2008)   TG: 137 (11/30/2008)  Problem # 5:  DIABETES MELLITUS, TYPE II, CONTROLLED (ICD-250.00) Assessment: Comment Only  Her updated medication list for this problem includes:    Aspirin 81 Mg Tbec (Aspirin) .Marland Kitchen... Take 1 tablet by mouth once a day    Metformin Hcl 1000 Mg Tabs (Metformin hcl) .Marland Kitchen... Take 1 tablet by mouth two times a day  Orders: Glucose, (CBG) [phone])  Labs Reviewed: Creat: 0.67 (08/02/2008)    Reviewed HgBA1c results: 5.9 (11/30/2008)  6.8 (08/04/2008)  Problem # 6:  HYPERLIPIDEMIA (ICD-272.4) Assessment: Comment Only  Labs Reviewed: SGOT: 32 (01/12/2009)   SGPT: 31 (01/12/2009)   HDL:48 (11/30/2008), 56 (08/02/2008)  LDL:183 (11/30/2008), 146 (08/02/2008)  Chol:258 (11/30/2008), 233 (08/02/2008)  Trig:137 (11/30/2008), 157 (08/02/2008)  Her updated medication list for this problem includes:    Zetia 10 Mg Tabs (Ezetimibe) .Marland Kitchen... Take 1 tab by mouth at  bedtime  Complete Medication List: 1)  Aspirin 81 Mg Tbec (Aspirin) .... Take 1 tablet by mouth once a day 2)  Oscal 500/200 D-3 500-200 Mg-unit Tabs (Calcium-vitamin d) .... Take 1 tablet by mouth three times a day 3)  Triamterene-hctz 75-50 Mg Tabs (Triamterene-hctz) .... Take 1 tablet by mouth once a day 4)  Metoprolol Succinate 25 Mg Tb24 (Metoprolol succinate) .... Take 1 tablet by mouth once a day 5)  Metformin Hcl 1000 Mg Tabs (Metformin hcl) .... Take 1 tablet by mouth two times a day 6)  Allopurinol 300 Mg Tabs (Allopurinol) .... Take 1 tablet by mouth once a day 7)  Klor-con M20 20 Meq Tbcr (Potassium chloride crys cr) .... Take 1 tablet by mouth once a day 8)  Lasix 20 Mg Tabs (Furosemide) .... Take one half tablet by mouth daily as needed 9)  Norvasc 5 Mg Tabs (Amlodipine besylate) .... Take 1 tablet by mouth once a day 10)  Omeprazole 40  Mg Cpdr (Omeprazole) .... Take 1 capsule by mouth once a day 11)  Align Caps (Misc intestinal flora regulat) .... Take 1 capsule by mouth once a day 12)  Flonase 50 Mcg/act Susp (Fluticasone propionate) .... 2 puffs per nostril daily 13)  Risaquad Caps (Probiotic product) .... Take 1 tablet by mouth once a day 14)  Zetia 10 Mg Tabs (Ezetimibe) .... Take 1 tab by mouth at bedtime  Patient Instructions: 1)  Please schedule a follow-up appointment in 2.5 months. 2)  You need to lose weight. Consider a lower calorie diet and regular exercise.  3)  PLS start greens, shredded wheat, all bran cereal and water. 4)  No med changes at this time, I will calll about the cholesterol med Prescriptions: ZETIA 10 MG TABS (EZETIMIBE) Take 1 tab by mouth at bedtime  #30 x 4   Entered and Authorized by:   Syliva Overman MD   Signed by:   Syliva Overman MD on 02/13/2009   Method used:   Electronically to        [facility] [address] [phone]* (retail)       [address] [place], [place]  [address]       Ph: [phone]       Fax: [phone]   RxID:    [id number] METFORMIN HCL 1000 MG  TABS (METFORMIN HCL) Take 1 tablet by mouth two times a day  #60 x 6   Entered by:   Everitt Amber   Authorized by:   Syliva Overman MD   Signed by:   Everitt Amber on 02/02/2009   Method used:   Electronically to        [facility] [address] [phone]* (retail)       [address], [place]  [address]       Ph: [phone]       Fax: [phone]   RxID:   [id number] METOPROLOL SUCCINATE 25 MG  TB24 (METOPROLOL SUCCINATE) Take 1 tablet by mouth once a day  #30.0 Each x 6   Entered by:   Everitt Amber   Authorized by:   Syliva Overman MD   Signed by:   Everitt Amber on 02/02/2009   Method used:   Electronically to        [facility] [address] [phone]* (retail)       [address] [place], [place]  [address]       Ph: [phone]       Fax: [phone]   RxID:   [id number]   Laboratory Results   Blood Tests   Date/Time Received: February 02, 2009  Date/Time Reported: February 02, 2009   Glucose (random): 116 mg/dL   (Normal Range: 27-035)     Appended Document: office visit pls let pt know zetia has been prescribed for her cholesterol which should not affect her liver, let her know if too expensive we can request samples for her, I am not sure of the cost to her she willalso need fasting lipid and hepatic in 3 months pls  Appended Document: office visit patient aware

## 2010-02-08 NOTE — Progress Notes (Signed)
Summary: DR. Lovena Neighbours  DR. MARISSA HOWARD-MCNATT   Imported By: Lind Guest 06/09/2008 09:34:39  _____________________________________________________________________  External Attachment:    Type:   Image     Comment:   External Document

## 2010-02-08 NOTE — Miscellaneous (Signed)
Summary: REFILL  Clinical Lists Changes  Medications: Rx of TRIAMTERENE-HCTZ 75-50 MG  TABS (TRIAMTERENE-HCTZ) Take 1 tablet by mouth once a day;  #30 Tablet x 1;  Signed;  Entered by: Worthy Keeler LPN;  Authorized by: Syliva Overman MD;  Method used: Electronically to [facility] [address], [place], [place], [place]  [address], Ph: [phone] or [phone], Fax: [phone]    Prescriptions: TRIAMTERENE-HCTZ 75-50 MG  TABS (TRIAMTERENE-HCTZ) Take 1 tablet by mouth once a day  #30 Tablet x 1   Entered by:   Worthy Keeler LPN   Authorized by:   Syliva Overman MD   Signed by:   Worthy Keeler LPN on 82/42/3536   Method used:   Electronically to        [facility] (retail)       [address]       [place], [place]  [address]       Ph: [phone] or [phone]       Fax: [phone]   RxID:   [id number]

## 2010-02-08 NOTE — Miscellaneous (Signed)
Summary: Refill  Clinical Lists Changes  Medications: Rx of OMEPRAZOLE 20 MG  TBEC (OMEPRAZOLE) Take 1 tablet by mouth once a day;  #30 x 4;  Signed;  Entered by: Everitt Amber;  Authorized by: Syliva Overman MD;  Method used: Electronically to [facility], [place], [place], [place]  [address], Ph: [phone] or [phone], Fax: [phone]    Prescriptions: OMEPRAZOLE 20 MG  TBEC (OMEPRAZOLE) Take 1 tablet by mouth once a day  #30 x 4   Entered by:   Everitt Amber   Authorized by:   Syliva Overman MD   Signed by:   Everitt Amber on 12/14/2007   Method used:   Electronically to        [facility] (retail)       [address]       [place], [place]  [address]       Ph: [phone] or [phone]       Fax: [phone]   RxID:   [id number]

## 2010-02-08 NOTE — [facility] (Signed)
[facility]   Im[facility] By: Lind Guest 10/17/2009 08:55:57  _____________________________________________________________________  External Attachment:    Type:   Image     Comment:   External Document

## 2010-02-08 NOTE — Progress Notes (Signed)
[facility]   Imported By: Lind Guest 02/16/2009 11:28:20  _____________________________________________________________________  External Attachment:    Type:   Image     Comment:   External Document
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
## 2010-02-08 NOTE — Miscellaneous (Signed)
Summary: Orders Update  Clinical Lists Changes  Orders: Added new Test order of T-Hepatic Function ([id number]) - Signed 

## 2010-02-08 NOTE — Assessment & Plan Note (Signed)
Summary: office visit   Vital Signs:  Patient Profile:   77 Years Old Female Height:     63 inches Weight:      208 pounds BMI:     36.98 O2 Sat:      98 % O2 treatment:    Room Air Pulse rate:   57 / minute Pulse rhythm:   regular Resp:     16 per minute BP sitting:   118 / 70  (left arm)  Vitals Entered By: Everitt Amber (December 15, 2007 8:25 AM)                 Chief Complaint:  Follow up and sometimes at night feels like food is coming back up in her throat.  History of Present Illness: C/O inc reflux symptoms worse ove rht epast week but noted for the past month. She denies blood in the stool, nausea or vomitting. She reports improved anxiety and stress. She denies symptoms of uncontrolled blood sugar, her morning sugars are generally less than 110.  She has not been exercising regularly.    Updated Prior Medication List: ASPIRIN 81 MG  TBEC (ASPIRIN) Take 1 tablet by mouth once a day OSCAL 500/200 D-3 500-200 MG-UNIT  TABS (CALCIUM-VITAMIN D) Take 1 tablet by mouth three times a day TRIAMTERENE-HCTZ 75-50 MG  TABS (TRIAMTERENE-HCTZ) Take 1 tablet by mouth once a day METOPROLOL SUCCINATE 25 MG  TB24 (METOPROLOL SUCCINATE) Take 1 tablet by mouth once a day METFORMIN HCL 1000 MG  TABS (METFORMIN HCL) Take 1 tablet by mouth two times a day ALLOPURINOL 300 MG  TABS (ALLOPURINOL) Take 1 tablet by mouth once a day KLOR-CON M20 20 MEQ  TBCR (POTASSIUM CHLORIDE CRYS CR) Take 1 tablet by mouth once a day LASIX 20 MG  TABS (FUROSEMIDE) Take one half tablet by mouth daily as needed NORVASC 5 MG  TABS (AMLODIPINE BESYLATE) Take 1 tablet by mouth once a day OMEPRAZOLE 20 MG  TBEC (OMEPRAZOLE) Take 1 tablet by mouth once a day  Current Allergies: ! DARVOCET ! FELDENE     Review of Systems  ENT      Denies hoarseness, nasal congestion, sinus pressure, and sore throat.  CV      Denies chest pain or discomfort, palpitations, shortness of breath with exertion, and  swelling of feet.  Resp      Denies cough, sputum productive, and wheezing.  GI      Complains of abdominal pain and indigestion.      Denies constipation, hemorrhoids, nausea, and vomiting.      inc reflux symptoms  GU      Denies dysuria and urinary frequency.  MS      Denies joint pain and stiffness.  Derm      Denies lesion(s), poor wound healing, and rash.  Neuro      Denies falling down, headaches, and seizures.  Psych      Complains of anxiety and depression.  Endo      Denies cold intolerance, excessive hunger, excessive thirst, excessive urination, heat intolerance, polyuria, and weight change.  Heme      Denies abnormal bruising, bleeding, enlarge lymph nodes, fevers, pallor, and skin discoloration.  Allergy      Denies hives or rash, itching eyes, persistent infections, seasonal allergies, and sneezing.   Physical Exam  General:     overweight-appearing.   Head:     Normocephalic and atraumatic without obvious abnormalities. No apparent alopecia or balding. Eyes:  vision grossly intact.   Ears:     External ear exam shows no significant lesions or deformities.  Otoscopic examination reveals clear canals, tympanic membranes are intact bilaterally without bulging, retraction, inflammation or discharge. Hearing is grossly normal bilaterally. Nose:     no external deformity and no nasal discharge.   Mouth:     fair dentition.   Neck:     No deformities, masses, or tenderness noted. Lungs:     Normal respiratory effort, chest expands symmetrically. Lungs are clear to auscultation, no crackles or wheezes. Heart:     Normal rate and regular rhythm. S1 and S2 normal without gallop, murmur, click, rub or other extra sounds. Abdomen:     soft and non-tender.   Msk:     No deformity or scoliosis noted of thoracic or lumbar spine.   Extremities:     No clubbing, cyanosis, edema, or deformity noted with normal full range of motion of all joints.     Neurologic:     No cranial nerve deficits noted. Station and gait are normal. Plantar reflexes are down-going bilaterally. DTRs are symmetrical throughout. Sensory, motor and coordinative functions appear intact. Skin:     Intact without suspicious lesions or rashes Cervical Nodes:     No lymphadenopathy noted Psych:     Cognition and judgment appear intact. Alert and cooperative with normal attention span and concentration. No apparent delusions, illusions, hallucinations    Impression & Recommendations:  Problem # 1:  OBESITY (ICD-278.00) Assessment: Deteriorated  Problem # 2:  HYPERTENSION (ICD-401.9) Assessment: Improved  Her updated medication list for this problem includes:    Triamterene-hctz 75-50 Mg Tabs (Triamterene-hctz) .Marland Kitchen... Take 1 tablet by mouth once a day    Metoprolol Succinate 25 Mg Tb24 (Metoprolol succinate) .Marland Kitchen... Take 1 tablet by mouth once a day    Lasix 20 Mg Tabs (Furosemide) .Marland Kitchen... Take one half tablet by mouth daily as needed    Norvasc 5 Mg Tabs (Amlodipine besylate) .Marland Kitchen... Take 1 tablet by mouth once a day  BP today: 118/70 Prior BP: 130/74 (10/12/2007)  Labs Reviewed: Creat: 0.66 (01/22/2007) Chol: 181 (04/29/2007)   HDL: 66 (04/29/2007)   LDL: 95 (04/29/2007)   TG: 101 (04/29/2007)  Orders: T-Basic Metabolic Panel ([id number])   Problem # 3:  HYPERLIPIDEMIA (ICD-272.4) Assessment: Comment Only  Labs Reviewed: Chol: 181 (04/29/2007)   HDL: 66 (04/29/2007)   LDL: 95 (04/29/2007)   TG: 101 (04/29/2007) SGOT: 79 (06/10/2007)   SGPT: 91 (06/10/2007)  Orders: T-Hepatic Function ([id number]) T-Lipid Profile ([id number])   Problem # 4:  DIABETES MELLITUS, TYPE II, CONTROLLED (ICD-250.00) Assessment: Improved  Her updated medication list for this problem includes:    Aspirin 81 Mg Tbec (Aspirin) .Marland Kitchen... Take 1 tablet by mouth once a day    Metformin Hcl 1000 Mg Tabs (Metformin hcl) .Marland Kitchen... Take 1 tablet by mouth two times a day  Labs  Reviewed: HgBA1c: 6.9 (08/11/2007)   Creat: 0.66 (01/22/2007)   Microalbumin: 0.41 (04/30/2007) HBA1C today is 6.3 Orders: Glucose, (CBG) (82962) Hemoglobin A1C (83036)   Problem # 5:  GERD (ICD-530.81) Assessment: Deteriorated  The following medications were removed from the medication list:    Omeprazole 20 Mg Tbec (Omeprazole) .Marland Kitchen... Take 1 tablet by mouth once a day  Her updated medication list for this problem includes:    Omeprazole 40 Mg Cpdr (Omeprazole) .Marland Kitchen... Take 1 capsule by mouth once a day pt counselled to eliminate caffeine and stop eating 3 hrs before lying down  Complete Medication List: 1)  Aspirin 81 Mg Tbec (Aspirin) .... Take 1 tablet by mouth once a day 2)  Oscal 500/200 D-3 500-200 Mg-unit Tabs (Calcium-vitamin d) .... Take 1 tablet by mouth three times a day 3)  Triamterene-hctz 75-50 Mg Tabs (Triamterene-hctz) .... Take 1 tablet by mouth once a day 4)  Metoprolol Succinate 25 Mg Tb24 (Metoprolol succinate) .... Take 1 tablet by mouth once a day 5)  Metformin Hcl 1000 Mg Tabs (Metformin hcl) .... Take 1 tablet by mouth two times a day 6)  Allopurinol 300 Mg Tabs (Allopurinol) .... Take 1 tablet by mouth once a day 7)  Klor-con M20 20 Meq Tbcr (Potassium chloride crys cr) .... Take 1 tablet by mouth once a day 8)  Lasix 20 Mg Tabs (Furosemide) .... Take one half tablet by mouth daily as needed 9)  Norvasc 5 Mg Tabs (Amlodipine besylate) .... Take 1 tablet by mouth once a day 10)  Omeprazole 40 Mg Cpdr (Omeprazole) .... Take 1 capsule by mouth once a day  Other Orders: T-CBC w/Diff ([id number]) T-TSH ([id number])   Patient Instructions: 1)  Please schedule a follow-up appointment in 4 months. 2)  It is important that you exercise regularly at least 20 minutes 5 times a week. If you develop chest pain, have severe difficulty breathing, or feel very tired , stop exercising immediately and seek medical attention. 3)  You need to lose weight. Consider a lower  calorie diet and regular exercise.  4)  BMP prior to visit, ICD-9: 5)  Hepatic Panel prior to visit, ICD-9: 6)  Lipid Panel prior to visit, ICD-9:   today. 7)  TSH prior to visit, ICD-9: 8)  CBC w/ Diff prior to visit, ICD-9:   Prescriptions: METFORMIN HCL 1000 MG  TABS (METFORMIN HCL) Take 1 tablet by mouth two times a day  #60 x 5   Entered by:   Worthy Keeler LPN   Authorized by:   Syliva Overman MD   Signed by:   Worthy Keeler LPN on 29/56/2130   Method used:   Electronically to        [facility] (retail)       [address]       [place], [place]  [address]       Ph: [phone] or [phone]       Fax: [phone]   RxID:   [id number] LASIX 20 MG  TABS (FUROSEMIDE) Take one half tablet by mouth daily as needed  #30 x 5   Entered by:   Worthy Keeler LPN   Authorized by:   Syliva Overman MD   Signed by:   Worthy Keeler LPN on 95/18/8416   Method used:   Electronically to        [facility] (retail)       [address]       [place], [place]  [address]       Ph: [phone] or [phone]       Fax: [phone]   RxID:   [id number] METOPROLOL SUCCINATE 25 MG  TB24 (METOPROLOL SUCCINATE) Take 1 tablet by mouth once a day  #30 x 5   Entered by:   Worthy Keeler LPN   Authorized by:   Syliva Overman MD   Signed by:   Worthy Keeler LPN on 94/85/4627   Method used:   Electronically  to        [facility] [address] [address] [facility] (retail)       [address]       [place], [place]  [address]       Ph: [phone] or [phone]       Fax: [phone]   RxID:   [id number] TRIAMTERENE-HCTZ 75-50 MG  TABS (TRIAMTERENE-HCTZ) Take 1 tablet by mouth once a day  #30 x 5   Entered by:   Worthy Keeler LPN   Authorized by:   Syliva Overman MD   Signed by:   Worthy Keeler LPN on 27/25/3664   Method used:   Electronically to        [facility] (retail)       [address]       [place], [place]  [address]       Ph: [phone] or [phone]       Fax: [phone]   RxID:   [id number] OMEPRAZOLE 40 MG CPDR (OMEPRAZOLE) Take 1 capsule by mouth once a day  #30 x 4   Entered and Authorized by:   Syliva Overman MD   Signed by:   Syliva Overman MD on 12/15/2007   Method used:   Electronically to        [facility] (retail)       [address]       [place], [place]  [address]       Ph: [phone] or [phone]       Fax: [phone]   RxID:   [id number]  ] Laboratory Results   Blood Tests   Date/Time Received: December 15, 2007 8am Date/Time Reported: December 15, 2007 8am  Glucose (fasting): 106 mg/dL   (Normal Range: 16-967) HGBA1C: 6.3%   (Normal Range: Non-Diabetic - 3-6%   Control Diabetic - 6-8%)

## 2010-02-08 NOTE — Letter (Signed)
[facility]   Imported By: Lind Guest 01/25/2010 10:29:12  _____________________________________________________________________  External Attachment:    Type:   Image     Comment:   External Document

## 2010-02-08 NOTE — Miscellaneous (Signed)
Summary: Refill  Clinical Lists Changes  Medications: Rx of METOPROLOL SUCCINATE 25 MG  TB24 (METOPROLOL SUCCINATE) Take 1 tablet by mouth once a day;  #30 x 4;  Signed;  Entered by: Everitt Amber;  Authorized by: Syliva Overman MD;  Method used: Electronically to [facility] [address] [facility] [address] [place], [place], [place]  [address], Ph: [phone] or [phone], Fax: [phone]    Prescriptions: METOPROLOL SUCCINATE 25 MG  TB24 (METOPROLOL SUCCINATE) Take 1 tablet by mouth once a day  #30 x 4   Entered by:   Everitt Amber   Authorized by:   Syliva Overman MD   Signed by:   Everitt Amber on 12/14/2007   Method used:   Electronically to        [facility] (retail)       [address]       [place], [place]  [address]       Ph: [phone] or [phone]       Fax: [phone]   RxID:   [id number]

## 2010-02-08 NOTE — Assessment & Plan Note (Signed)
Summary: office visit   Vital Signs:  Patient profile:   77 year old female Menstrual status:  hysterectomy Height:      63 inches (160.02 cm) Weight:      206.56 pounds (93.89 kg) BMI:     36.72 BSA:     1.96 O2 Sat:      98 % Pulse rate:   62 / minute Resp:     16 per minute BP sitting:   130 / 80  (left arm) Cuff size:   large  Vitals Entered By: Everitt Amber (April 14, 2008 8:22 AM)  Nutrition Counseling: Patient's BMI is greater than 25 and therefore counseled on weight management options. CC: Allergies acting up Is Patient Diabetic? Yes   years   days  Menstrual Status hysterectomy   CC:  Allergies acting up.  History of Present Illness: Patient reports doing well.  Denies any recent fever or chills.  Denies any appetite change or change in bowel movements. Patient denies depression, anxiety or insomnia. Main complaint is of allergy symptoms, stuffy nose , sneezing, post nasal drainage. she requests nasal spray, no fever or chills.  Preventive Screening-Counseling & Management     Smoking Status: never  Current Medications (verified): 1)  Aspirin 81 Mg  Tbec (Aspirin) .... Take 1 Tablet By Mouth Once A Day 2)  Oscal 500/200 D-3 500-200 Mg-Unit  Tabs (Calcium-Vitamin D) .... Take 1 Tablet By Mouth Three Times A Day 3)  Triamterene-Hctz 75-50 Mg  Tabs (Triamterene-Hctz) .... Take 1 Tablet By Mouth Once A Day 4)  Metoprolol Succinate 25 Mg  Tb24 (Metoprolol Succinate) .... Take 1 Tablet By Mouth Once A Day 5)  Metformin Hcl 1000 Mg  Tabs (Metformin Hcl) .... Take 1 Tablet By Mouth Two Times A Day 6)  Allopurinol 300 Mg  Tabs (Allopurinol) .... Take 1 Tablet By Mouth Once A Day 7)  Klor-Con M20 20 Meq  Tbcr (Potassium Chloride Crys Cr) .... Take 1 Tablet By Mouth Once A Day 8)  Lasix 20 Mg  Tabs (Furosemide) .... Take One Half Tablet By Mouth Daily As Needed 9)  Norvasc 5 Mg  Tabs (Amlodipine Besylate) .... Take 1 Tablet By Mouth Once A Day 10)  Omeprazole 40 Mg Cpdr  (Omeprazole) .... Take 1 Capsule By Mouth Once A Day 11)  Align  Caps (Misc Intestinal Flora Regulat) .... Take 1 Capsule By Mouth Once A Day 12)  Flonase 50 Mcg/act Susp (Fluticasone Propionate) .... 2 Puffs Per Nostril Daily  Allergies (verified): 1)  ! Darvocet 2)  ! Feldene  Review of Systems General:  Denies chills, fatigue, and fever. Eyes:  Denies blurring, discharge, eye pain, and red eye. ENT:  Complains of nasal congestion and postnasal drainage; denies hoarseness, sinus pressure, and sore throat. CV:  Denies chest pain or discomfort, palpitations, shortness of breath with exertion, and swelling of feet. Resp:  Denies cough, shortness of breath, sputum productive, and wheezing. GI:  improved on align , wants to continue this. GU:  Denies dysuria and urinary frequency. MS:  Complains of joint pain, low back pain, mid back pain, and stiffness. Derm:  Denies itching, lesion(s), and rash. Psych:  Complains of anxiety; denies depression; care of spouse with dementia is stressful. Endo:  See HPI; Denies cold intolerance, excessive hunger, excessive thirst, excessive urination, heat intolerance, polyuria, and weight change. Allergy:  Denies itching eyes and sneezing.  Physical Exam  General:  alert, well-hydrated, and overweight-appearing.  HEENT: No facial asymmetry,  EOMI, No sinus tenderness, TM's Clear, oropharynx  pink and moist.   Chest: Clear to auscultation bilaterally.  CVS: S1, S2, No murmurs, No S3.   Abd: Soft, Nontender.  MS: Adequate ROM spine, hips, shoulders and knees.  Ext: No edema.   CNS: CN 2-12 intact, power tone and sensation normal throughout.   Skin: Intact, no visible lesions or rashes.  Psych: Good eye contact, normal affect.  Memory intact, not anxious or depressed appearing.    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 Take 1 capsule by mouth once a day,pt repts improved gI symptoms with align, same is prescribed  Problem # 3:  OBESITY (ICD-278.00) Assessment: Improved  Ht: 63 (04/14/2008)   Wt: 206.56 (04/14/2008)   BMI: 36.72 (04/14/2008)  Problem # 4:  HYPERTENSION (ICD-401.9) Assessment: Unchanged  Her updated medication list for this problem includes:    Triamterene-hctz 75-50 Mg Tabs (Triamterene-hctz) .Marland Kitchen... Take 1 tablet by mouth once a day    Metoprolol Succinate 25 Mg Tb24 (Metoprolol succinate) .Marland Kitchen... Take 1 tablet by mouth once a day    Lasix 20 Mg Tabs (Furosemide) .Marland Kitchen... Take one half tablet by mouth daily as needed    Norvasc 5 Mg Tabs (Amlodipine besylate) .Marland Kitchen... Take 1 tablet by mouth once a day  Orders: T-Basic Metabolic Panel [id number])  BP today: 130/80 Prior BP: 130/78 (02/09/2008)  Labs Reviewed: K+: 4.5 (12/15/2007) Creat: : 0.70 (12/15/2007)   Chol: 229 (12/15/2007)   HDL: 58 (12/15/2007)   LDL: 146 (12/15/2007)   TG: 124 (12/15/2007)  Problem # 5:  DIABETES MELLITUS, TYPE II, CONTROLLED (ICD-250.00) Assessment: Deteriorated  Her updated medication list for this problem includes:    Aspirin 81 Mg Tbec (Aspirin) .Marland Kitchen... Take 1 tablet by mouth once a day    Metformin Hcl 1000 Mg Tabs (Metformin hcl) .Marland Kitchen... Take 1 tablet by mouth two times a day  Orders: T-Urine Microalbumin w/creat. ratio [id number] / [id number])  Labs Reviewed: Creat: 0.70 (12/15/2007)    Reviewed HgBA1c results: 6.3 (12/15/2007)  6.9  (08/11/2007)  Complete Medication List: 1)  Aspirin 81 Mg Tbec (Aspirin) .... Take 1 tablet by mouth once a day 2)  Oscal 500/200 D-3 500-200 Mg-unit Tabs (Calcium-vitamin d) .... Take 1 tablet by mouth three times a day 3)  Triamterene-hctz 75-50 Mg Tabs (Triamterene-hctz) .... Take 1 tablet by mouth once a day 4)  Metoprolol Succinate 25 Mg Tb24 (Metoprolol succinate) .... Take 1 tablet by mouth once a day 5)  Metformin Hcl 1000 Mg Tabs (Metformin hcl) .... Take 1 tablet by mouth two times a  day 6)  Allopurinol 300 Mg Tabs (Allopurinol) .... Take 1 tablet by mouth once a day 7)  Klor-con M20 20 Meq Tbcr (Potassium chloride crys cr) .... Take 1 tablet by mouth once a day 8)  Lasix 20 Mg Tabs (Furosemide) .... Take one half tablet by mouth daily as needed 9)  Norvasc 5 Mg Tabs (Amlodipine besylate) .... Take 1 tablet by mouth once a day 10)  Omeprazole 40 Mg Cpdr (Omeprazole) .... Take 1 capsule by mouth once a day 11)  Align Caps (Misc intestinal flora regulat) .... Take 1 capsule by mouth once a day 12)  Flonase 50 Mcg/act Susp (Fluticasone propionate) .... 2 puffs per nostril daily  Other Orders: T-Hepatic Function [id number]) T-Hepatic Function [id number]) T-Lipid Profile [id number]) T-Uric Acid (Blood) [id number])  Patient Instructions: 1)  f/U in 3.5 months. 2)  It is important that you exercise regularly at least 40 minutes 5 times a week. If you develop chest pain, have severe difficulty breathing, or feel very tired , stop exercising immediately and seek medical attention. 3)  You need to lose weight. Consider a lower calorie diet and regular exercise. 4)  Your blodd pressure and blood sugar are great.  5)  New meds sent in as discussed.  6)  BMP prior to visit, ICD-9: 7)  Hepatic Panel prior to visit, ICD-9:   fasting in 3.5 months. 8)  Lipid Panel prior to visit, ICD-9: 9)  Urine Microalbumin prior to visit, ICD-9: 10)  Uric acid 11)  Hepatic panel today 12)   Pneumonia and TdaP today Prescriptions: OMEPRAZOLE 40 MG CPDR (OMEPRAZOLE) Take 1 capsule by mouth once a day  #30 x 5   Entered by:   Worthy Keeler LPN   Authorized by:   Syliva Overman MD   Signed by:   Worthy Keeler LPN on 03/47/4259   Method used:   Electronically to        [facility] (retail)       [address]       [place], [place]  [address]       Ph: [phone] or [phone]       Fax: [phone]   RxID:   [id number] METFORMIN HCL 1000 MG  TABS (METFORMIN HCL) Take 1 tablet by mouth two times a day  #60 x 5   Entered by:   Worthy Keeler LPN   Authorized by:   Syliva Overman MD   Signed by:   Worthy Keeler LPN on 28/31/5176   Method used:   Electronically to        [facility] (retail)       [address]       [place], [place]  [address]       Ph: [phone] or [phone]       Fax: [phone]   RxID:   [id number] ALLOPURINOL 300 MG  TABS (ALLOPURINOL) Take 1 tablet by mouth once a day  #30 x 5   Entered by:   Worthy Keeler LPN   Authorized by:   Syliva Overman MD   Signed by:   Worthy Keeler LPN on 58/52/7782   Method used:   Electronically to        [facility] (retail)       [address]       [place], [place]  [address]       Ph: [phone] or [phone]       Fax: [phone]   RxID:   [id number] METOPROLOL SUCCINATE 25 MG  TB24 (METOPROLOL SUCCINATE) Take 1 tablet by mouth once a day  #30 x 5   Entered by:   Worthy Keeler LPN   Authorized by:   Syliva Overman MD   Signed by:   Worthy Keeler LPN on 27/25/3664   Method used:   Electronically to        [facility] (retail)       [address]       [place], [place]  [address]       Ph: [phone] or [phone]       Fax: [phone]   RxID:   [id number] FLONASE 50 MCG/ACT SUSP (FLUTICASONE PROPIONATE) 2 puffs per nostril  daily  #1 x 4   Entered and Authorized by:   Syliva Overman MD   Signed by:   Syliva Overman MD on 04/14/2008   Method used:   Electronically to        [facility] (retail)       [address]       [place], [place]  [address]       Ph: [phone] or [phone]       Fax: [phone]   RxID:   [id number] ALIGN  CAPS (MISC INTESTINAL FLORA REGULAT) Take 1 capsule by mouth once a day  #30 x 5   Entered and Authorized by:   Syliva Overman MD   Signed by:   Syliva Overman MD on 04/14/2008   Method used:   Electronically to        [facility] (retail)       [address]       [place], [place]  [address]       Ph: [phone] or [phone]       Fax: [phone]   RxID:   [id number]          Orders Added: 1)  Est. Patient Level IV [[address]] 2)  T-Hepatic Function [80076-22960] 3)  T-Basic Metabolic Panel [id number] 4)  T-Hepatic Function [80076-22960] 5)  T-Lipid Profile [80061-22930] 6)  T-Uric Acid (Blood) [[address]4550-[address]] 7)  T-Urine Microalbumin w/creat. ratio [id number] / [id number]]    Appended Document: office visit     Allergies: 1)  ! Darvocet 2)  ! Feldene   Complete Medication List: 1)  Aspirin 81 Mg Tbec (Aspirin) .... Take 1 tablet by mouth once a day 2)  Oscal 500/200 D-3 500-200 Mg-unit Tabs (Calcium-vitamin d) .... Take 1 tablet by mouth three times a day 3)  Triamterene-hctz 75-50 Mg Tabs (Triamterene-hctz) .... Take 1 tablet by mouth once a day 4)  Metoprolol Succinate 25 Mg Tb24 (Metoprolol succinate) .... Take 1 tablet by mouth once a day 5)  Metformin Hcl 1000 Mg Tabs (Metformin hcl) .... Take 1 tablet by mouth two times a day 6)  Allopurinol 300 Mg Tabs (Allopurinol) .... Take 1 tablet by mouth once a day 7)  Klor-con M20 20 Meq Tbcr (Potassium chloride crys cr) .... Take 1 tablet by mouth once a day 8)  Lasix 20 Mg Tabs (Furosemide) .... Take one  half tablet by mouth daily as needed 9)  Norvasc  5 Mg Tabs (Amlodipine besylate) .... Take 1 tablet by mouth once a day 10)  Omeprazole 40 Mg Cpdr (Omeprazole) .... Take 1 capsule by mouth once a day 11)  Align Caps (Misc intestinal flora regulat) .... Take 1 capsule by mouth once a day 12)  Flonase 50 Mcg/act Susp (Fluticasone propionate) .... 2 puffs per nostril daily  Other Orders: Glucose, (CBG) ([address]) Hemoglobin A1C ([address])       Laboratory Results   Blood Tests   Date/Time Received: April 14, 2008 8am Date/Time Reported: April 14, 2008 8am  Glucose (fasting): 110 mg/dL   (Normal Range: 09-811) HGBA1C: 6.8%   (Normal Range: Non-Diabetic - 3-6%   Control Diabetic - 6-8%)     Appended Document: office visit   Tetanus/Td Vaccine    Vaccine Type: Tdap    Site: left deltoid    Mfr: GlaxoSmithKline    Dose: 0.5 ml    Route: IM    Given by: Worthy Keeler LPN    Exp. Date: 03/02/2010    Lot #: [id number]    VIS given: 11/25/06 version given April 14, 2008.  Pneumovax Vaccine    Vaccine Type: Pneumovax    Site: right deltoid    Mfr: Merck    Dose: 0.5 ml    Route: IM    Given by: Worthy Keeler LPN    Exp. Date: 12/09/2008    Lot #: [id number]    VIS given: 08/05/95 version given April 14, 2008.

## 2010-02-08 NOTE — Miscellaneous (Signed)
Summary: refill  Clinical Lists Changes  Medications: Rx of LASIX 20 MG  TABS (FUROSEMIDE) Take one half tablet by mouth daily as needed;  #30 x 1;  Signed;  Entered by: Worthy Keeler LPN;  Authorized by: Syliva Overman MD;  Method used: Electronically to [facility] [address], [place], [place], [place]  [address], Ph: [phone] or [phone], Fax: [phone]    Prescriptions: LASIX 20 MG  TABS (FUROSEMIDE) Take one half tablet by mouth daily as needed  #30 x 1   Entered by:   Worthy Keeler LPN   Authorized by:   Syliva Overman MD   Signed by:   Worthy Keeler LPN on 62/83/1517   Method used:   Electronically to        [facility] (retail)       [address]       [place], [place]  [address]       Ph: [phone] or [phone]       Fax: [phone]   RxID:   [id number]

## 2010-02-08 NOTE — Miscellaneous (Signed)
Summary: Refill  Clinical Lists Changes  Medications: Rx of NORVASC 5 MG  TABS (AMLODIPINE BESYLATE) Take 1 tablet by mouth once a day;  #30 x 4;  Signed;  Entered by: Everitt Amber;  Authorized by: Syliva Overman MD;  Method used: Historical    Prescriptions: NORVASC 5 MG  TABS (AMLODIPINE BESYLATE) Take 1 tablet by mouth once a day  #30 x 4   Entered by:   Everitt Amber   Authorized by:   Syliva Overman MD   Signed by:   Everitt Amber on 03/29/2008   Method used:   Historical   RxID:   [id number]

## 2010-02-08 NOTE — Letter (Signed)
Summary: Letter  Letter   Imported By: Lind Guest 09/02/2008 13:54:08  _____________________________________________________________________  External Attachment:    Type:   Image     Comment:   External Document
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
## 2010-02-08 NOTE — Progress Notes (Signed)
Summary: [facility]   Imported By: Lind Guest 05/31/2009 13:29:55  _____________________________________________________________________  External Attachment:    Type:   Image     Comment:   External Document

## 2010-02-08 NOTE — Letter (Signed)
Summary: Recall Office Visit  [facility]  [address]   [place], [place] [address]   Phone: [phone]  Fax: [phone]      October 04, 2009   LIZZA HUFFAKER [address] [place], [place]  [address] 05-03-1933   Dear Ms. Cannady,   According to our records, it is time for you to schedule a follow-up office visit with [place].   At your convenience, please call [phone] to schedule an office visit. If you have any questions, concerns, or feel that this letter is in error, we would appreciate your call.   Sincerely,    Diana Eves  [facility] Ph: [phone]   Fax: [phone]
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
## 2010-02-08 NOTE — Miscellaneous (Signed)
Summary: Orders Update  Clinical Lists Changes  Orders: Added new Test order of T-Basic Metabolic Panel [phone]) - Signed Added new Test order of T-Hepatic Function [phone]) - Signed Added new Test order of T-Lipid Profile ([id number]) - Signed Added new Test order of T-Urine Microalbumin w/creat. ratio [phone] / [id number]) - Signed Added new Test order of T-Uric Acid (Blood) [phone]) - Signed

## 2010-02-08 NOTE — Progress Notes (Signed)
Summary: [facility]   Imported By: Lind Guest 05/09/2009 09:22:44  _____________________________________________________________________  External Attachment:    Type:   Image     Comment:   External Document

## 2010-02-08 NOTE — Progress Notes (Signed)
Summary: health issues  Phone Note Call from Patient   Summary of Call: needs to speak with nurse about so health issues. [phone] Initial call taken by: Rudene Anda,  March 16, 2009 10:13 AM  Follow-up for Phone Call        patient states she is having trouble helping husband, is there anyway she could get some assistance with getting a walk in tub? Follow-up by: Adella Hare LPN,  March 16, 2009 10:19 AM  Additional Follow-up for Phone Call Additional follow up Details #1::        [name] and [name] agree on referral for dementia care program, i will hold on tub referral Additional Follow-up by: Syliva Overman MD,  March 22, 2009 11:07 AM

## 2010-02-08 NOTE — Op Note (Signed)
Summary: Operative Report  Operative Report   Imported By: Lind Guest 12/16/2008 09:13:43  _____________________________________________________________________  External Attachment:    Type:   Image     Comment:   External Document

## 2010-02-08 NOTE — Letter (Signed)
Summary: Letter  Letter   Imported By: Lind Guest 07/18/2009 16:36:25  _____________________________________________________________________  External Attachment:    Type:   Image     Comment:   External Document
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
## 2010-02-08 NOTE — Assessment & Plan Note (Signed)
Summary: office visit   Vital Signs:  Patient profile:   77 year old female Menstrual status:  hysterectomy Height:      63 inches Weight:      190.25 pounds BMI:     33.82 O2 Sat:      93 % Pulse rate:   96 / minute Pulse rhythm:   regular Resp:     16 per minute BP sitting:   110 / 74  (left arm) Cuff size:   large  Vitals Entered By: Everitt Amber LPN (April 13, 1189 9:43 AM)  Nutrition Counseling: Patient's BMI is greater than 25 and therefore counseled on weight management options. CC: Follow up chronic problems Is Patient Diabetic? Yes   Primary Care Provider:  Lodema Hong, M.D.  CC:  Follow up chronic problems.  History of Present Illness: Pt is currrently undergoing a 16 course chemo treatment for bilateral breast cancer. She states that at times the treatment Reports  that they are doing well. Denies recent fever or chills. Denies sinus pressure, nasal congestion , ear pain or sore throat. Denies chest congestion, or cough productive of sputum. Denies chest pain, palpitations, PND, orthopnea or leg swelling. Denies abdominal pain, nausea, vomitting, diarrhea or constipation. Denies change in bowel movements or bloody stool. Denies dysuria , frequency, incontinence or hesitancy. Denies  joint pain, swelling, or reduced mobility. Denies headaches, vertigo, seizures. Denies depression, anxiety or insomnia. Denies  rash, lesions, or itch. Tests blood sugars regularly and fastings are seldom over 130      Allergies: 1)  ! Darvocet 2)  ! Feldene 3)  ! Seldane  Review of Systems      See HPI Eyes:  Denies blurring and discharge. Endo:  Denies cold intolerance, excessive hunger, excessive thirst, excessive urination, heat intolerance, polyuria, and weight change. Heme:  Denies abnormal bruising and bleeding. Allergy:  Denies hives or rash.  Physical Exam  General:  Well-developed,well-nourished,in no acute distress; alert,appropriate and cooperative throughout  examination HEENT: No facial asymmetry,  EOMI, No sinus tenderness, TM's Clear, oropharynx  pink and moist.   Chest: Clear to auscultation bilaterally.  CVS: S1, S2, No murmurs, No S3.   Abd: Soft, Nontender.  MS: Adequate ROM spine, hips, shoulders and knees.  Ext: No edema.   CNS: CN 2-12 intact, power tone and sensation normal throughout.   Skin: Intact, no visible lesions or rashes.  Psych: Good eye contact, normal affect.  Memory intact, not anxious or depressed appearing.    Impression & Recommendations:  Problem # 1:  HYPERTENSION (ICD-401.9) Assessment Unchanged  Her updated medication list for this problem includes:    Triamterene-hctz 75-50 Mg Tabs (Triamterene-hctz) .Marland Kitchen... Take 1 tablet by mouth once a day    Metoprolol Succinate 25 Mg Tb24 (Metoprolol succinate) .Marland Kitchen... Take 1 tablet by mouth once a day    Lasix 20 Mg Tabs (Furosemide) .Marland Kitchen... Take one half tablet by mouth daily as needed    Norvasc 5 Mg Tabs (Amlodipine besylate) .Marland Kitchen... Take 1 tablet by mouth once a day  BP today: 110/74 Prior BP: 120/80 (02/02/2009)  Labs Reviewed: K+: 4.6 (08/02/2008) Creat: : 0.67 (08/02/2008)   Chol: 258 (11/30/2008)   HDL: 48 (11/30/2008)   LDL: 183 (11/30/2008)   TG: 137 (11/30/2008)  Problem # 2:  OBESITY (ICD-278.00) Assessment: Improved  Ht: 63 (04/13/2009)   Wt: 190.25 (04/13/2009)   BMI: 33.82 (04/13/2009)  Problem # 3:  HYPERLIPIDEMIA (ICD-272.4) Assessment: Comment Only  Her updated medication list for this  problem includes:    Zetia 10 Mg Tabs (Ezetimibe) .Marland Kitchen... Take 1 tab by mouth at bedtime  Orders: T-Lipid Profile [phone])  Labs Reviewed: SGOT: 38 (03/08/2009)   SGPT: 34 (03/08/2009)   HDL:48 (11/30/2008), 56 (08/02/2008)  LDL:183 (11/30/2008), 146 (08/02/2008)  Chol:258 (11/30/2008), 233 (08/02/2008)  Trig:137 (11/30/2008), 157 (08/02/2008)  Problem # 4:  DIABETES MELLITUS, TYPE II, CONTROLLED (ICD-250.00) Assessment: Comment Only  Her updated  medication list for this problem includes:    Aspirin 81 Mg Tbec (Aspirin) .Marland Kitchen... Take 1 tablet by mouth once a day    Metformin Hcl 1000 Mg Tabs (Metformin hcl) .Marland Kitchen... Take 1 tablet by mouth two times a day  Orders: T- Hemoglobin A1C ([id number])  Labs Reviewed: Creat: 0.67 (08/02/2008)    Reviewed HgBA1c results: 5.9 (11/30/2008)  6.8 (08/04/2008)  Complete Medication List: 1)  Aspirin 81 Mg Tbec (Aspirin) .... Take 1 tablet by mouth once a day 2)  Oscal 500/200 D-3 500-200 Mg-unit Tabs (Calcium-vitamin d) .... Take 1 tablet by mouth three times a day 3)  Triamterene-hctz 75-50 Mg Tabs (Triamterene-hctz) .... Take 1 tablet by mouth once a day 4)  Metoprolol Succinate 25 Mg Tb24 (Metoprolol succinate) .... Take 1 tablet by mouth once a day 5)  Metformin Hcl 1000 Mg Tabs (Metformin hcl) .... Take 1 tablet by mouth two times a day 6)  Allopurinol 300 Mg Tabs (Allopurinol) .... Take 1 tablet by mouth once a day 7)  Klor-con M20 20 Meq Tbcr (Potassium chloride crys cr) .... Take 1 tablet by mouth once a day 8)  Lasix 20 Mg Tabs (Furosemide) .... Take one half tablet by mouth daily as needed 9)  Norvasc 5 Mg Tabs (Amlodipine besylate) .... Take 1 tablet by mouth once a day 10)  Omeprazole 40 Mg Cpdr (Omeprazole) .... Take 1 capsule by mouth once a day 11)  Align Caps (Misc intestinal flora regulat) .... Take 1 capsule by mouth once a day 12)  Flonase 50 Mcg/act Susp (Fluticasone propionate) .... 2 puffs per nostril daily 13)  Risaquad Caps (Probiotic product) .... Take 1 tablet by mouth once a day 14)  Zetia 10 Mg Tabs (Ezetimibe) .... Take 1 tab by mouth at bedtime  Other Orders: T-TSH [phone]) T-Vitamin D (25-Hydroxy) [phone])  Patient Instructions: 1)  Please schedule a follow-up appointment in 3 months. 2)  It is important that you exercise regularly at least 20 minutes 5 times a week. If you develop chest pain, have severe difficulty breathing, or feel very tired , stop  exercising immediately and seek medical attention. 3)  Check your blood sugars regularly. If your readings are usually above 200 or below 70 you should contact our office. 4)  Lipid Panel prior to visit, ICD-9: 5)  TSH prior to visit, ICD-9:  fasting today 6)  HbgA1C prior to visit, ICD-9: 7)  Vitamin D 8)  pls continue the treatments and call if any problems. 9)  Your BP is great

## 2010-02-08 NOTE — Miscellaneous (Signed)
Summary: Refill  Clinical Lists Changes  Medications: Rx of TRIAMTERENE-HCTZ 75-50 MG  TABS (TRIAMTERENE-HCTZ) Take 1 tablet by mouth once a day;  #30 x 99;  Signed;  Entered by: Everitt Amber;  Authorized by: Syliva Overman MD;  Method used: Historical Rx of ALLOPURINOL 300 MG  TABS (ALLOPURINOL) Take 1 tablet by mouth once a day;  #30 x 99;  Signed;  Entered by: Everitt Amber;  Authorized by: Syliva Overman MD;  Method used: Historical Rx of LASIX 20 MG  TABS (FUROSEMIDE) Take one half tablet by mouth daily as needed;  #30 x 99;  Signed;  Entered by: Everitt Amber;  Authorized by: Syliva Overman MD;  Method used: Historical    Prescriptions: LASIX 20 MG  TABS (FUROSEMIDE) Take one half tablet by mouth daily as needed  #30 x 99   Entered by:   Everitt Amber   Authorized by:   Syliva Overman MD   Signed by:   Everitt Amber on 06/14/2008   Method used:   Historical   RxID:   [id number] ALLOPURINOL 300 MG  TABS (ALLOPURINOL) Take 1 tablet by mouth once a day  #30 x 99   Entered by:   Everitt Amber   Authorized by:   Syliva Overman MD   Signed by:   Everitt Amber on 06/14/2008   Method used:   Historical   RxID:   [id number] TRIAMTERENE-HCTZ 75-50 MG  TABS (TRIAMTERENE-HCTZ) Take 1 tablet by mouth once a day  #30 x 99   Entered by:   Everitt Amber   Authorized by:   Syliva Overman MD   Signed by:   Everitt Amber on 06/14/2008   Method used:   Historical   RxID:   [id number]

## 2010-02-15 ENCOUNTER — Encounter: Payer: Self-pay | Admitting: Family Medicine

## 2010-02-22 NOTE — Letter (Signed)
Summary: handicapp card  handicapp card   Imported By: Lind Guest 02/15/2010 14:16:20  _____________________________________________________________________  External Attachment:    Type:   Image     Comment:   External Document

## 2010-03-12 ENCOUNTER — Other Ambulatory Visit: Payer: Self-pay | Admitting: Family Medicine

## 2010-03-12 LAB — LIPID PANEL
Cholesterol: 239 mg/dL — ABNORMAL HIGH (ref 0–200)
HDL: 46 mg/dL (ref >39)
LDL Cholesterol: 166 mg/dL — ABNORMAL HIGH (ref 0–99)
Total CHOL/HDL Ratio: 5.2 Ratio
Triglycerides: 133 mg/dL (ref <150)
VLDL: 27 mg/dL (ref 0–40)

## 2010-03-12 LAB — HEPATIC FUNCTION PANEL
ALT: 25 U/L (ref 0–35)
AST: 25 U/L (ref 0–37)
Albumin: 4.6 g/dL (ref 3.5–5.2)
Alkaline Phosphatase: 54 U/L (ref 39–117)
Bilirubin, Direct: 0.1 mg/dL (ref 0.0–0.3)
Indirect Bilirubin: 0.3 mg/dL (ref 0.0–0.9)
Total Bilirubin: 0.4 mg/dL (ref 0.3–1.2)
Total Protein: 7.3 g/dL (ref 6.0–8.3)

## 2010-03-12 LAB — BASIC METABOLIC PANEL
BUN: 15 mg/dL (ref 6–23)
CO2: 30 mEq/L (ref 19–32)
Calcium: 9.8 mg/dL (ref 8.4–10.5)
Chloride: 98 mEq/L (ref 96–112)
Creat: 0.62 mg/dL (ref 0.40–1.20)
Glucose, Bld: 97 mg/dL (ref 70–99)
Potassium: 4.5 mEq/L (ref 3.5–5.3)
Sodium: 140 mEq/L (ref 135–145)

## 2010-03-12 LAB — CBC WITH DIFFERENTIAL/PLATELET
Basophils Absolute: 0 10*3/uL (ref 0.0–0.1)
Basophils Relative: 0 % (ref 0–1)
Eosinophils Absolute: 0.1 10*3/uL (ref 0.0–0.7)
Eosinophils Relative: 2 % (ref 0–5)
HCT: 38.7 % (ref 36.0–46.0)
Hemoglobin: 12.7 g/dL (ref 12.0–15.0)
Lymphocytes Relative: 36 % (ref 12–46)
Lymphs Abs: 1.8 10*3/uL (ref 0.7–4.0)
MCH: 28.5 pg (ref 26.0–34.0)
MCHC: 32.8 g/dL (ref 30.0–36.0)
MCV: 87 fL (ref 78.0–100.0)
Monocytes Absolute: 0.5 10*3/uL (ref 0.1–1.0)
Monocytes Relative: 11 % (ref 3–12)
Neutro Abs: 2.5 10*3/uL (ref 1.7–7.7)
Neutrophils Relative %: 50 % (ref 43–77)
Platelets: 301 10*3/uL (ref 150–400)
RBC: 4.45 MIL/uL (ref 3.87–5.11)
RDW: 15.4 % (ref 11.5–15.5)
WBC: 5 10*3/uL (ref 4.0–10.5)

## 2010-03-12 LAB — URIC ACID: Uric Acid, Serum: 3.5 mg/dL (ref 2.4–7.0)

## 2010-03-13 LAB — CONVERTED CEMR LAB
ALT: 25 units/L (ref 0–35)
AST: 25 units/L (ref 0–37)
Albumin: 4.6 g/dL (ref 3.5–5.2)
Alkaline Phosphatase: 54 units/L (ref 39–117)
BUN: 15 mg/dL (ref 6–23)
Basophils Absolute: 0 10*3/uL (ref 0.0–0.1)
Basophils Relative: 0 % (ref 0–1)
Bilirubin, Direct: 0.1 mg/dL (ref 0.0–0.3)
CO2: 30 meq/L (ref 19–32)
Calcium: 9.8 mg/dL (ref 8.4–10.5)
Chloride: 98 meq/L (ref 96–112)
Cholesterol: 239 mg/dL — ABNORMAL HIGH (ref 0–200)
Creatinine, Ser: 0.62 mg/dL (ref 0.40–1.20)
Eosinophils Absolute: 0.1 10*3/uL (ref 0.0–0.7)
Eosinophils Relative: 2 % (ref 0–5)
Glucose, Bld: 97 mg/dL (ref 70–99)
HCT: 38.7 % (ref 36.0–46.0)
HDL: 46 mg/dL (ref >39)
Hemoglobin: 12.7 g/dL (ref 12.0–15.0)
Hgb A1c MFr Bld: 6.4 % — ABNORMAL HIGH (ref <5.7)
Indirect Bilirubin: 0.3 mg/dL (ref 0.0–0.9)
LDL Cholesterol: 166 mg/dL — ABNORMAL HIGH (ref 0–99)
Lymphocytes Relative: 36 % (ref 12–46)
Lymphs Abs: 1.8 10*3/uL (ref 0.7–4.0)
MCHC: 32.8 g/dL (ref 30.0–36.0)
MCV: 87 fL (ref 78.0–100.0)
Monocytes Absolute: 0.5 10*3/uL (ref 0.1–1.0)
Monocytes Relative: 11 % (ref 3–12)
Neutro Abs: 2.5 10*3/uL (ref 1.7–7.7)
Neutrophils Relative %: 50 % (ref 43–77)
Platelets: 301 10*3/uL (ref 150–400)
Potassium: 4.5 meq/L (ref 3.5–5.3)
RBC: 4.45 M/uL (ref 3.87–5.11)
RDW: 15.4 % (ref 11.5–15.5)
Sodium: 140 meq/L (ref 135–145)
Total Bilirubin: 0.4 mg/dL (ref 0.3–1.2)
Total CHOL/HDL Ratio: 5.2
Total Protein: 7.3 g/dL (ref 6.0–8.3)
Triglycerides: 133 mg/dL (ref <150)
Uric Acid, Serum: 3.5 mg/dL (ref 2.4–7.0)
VLDL: 27 mg/dL (ref 0–40)
WBC: 5 10*3/uL (ref 4.0–10.5)

## 2010-03-13 LAB — HEMOGLOBIN A1C
Hgb A1c MFr Bld: 6.4 % — ABNORMAL HIGH (ref <5.7)
Mean Plasma Glucose: 137 mg/dL — ABNORMAL HIGH (ref <117)

## 2010-03-14 ENCOUNTER — Encounter: Payer: Self-pay | Admitting: Family Medicine

## 2010-03-16 ENCOUNTER — Encounter: Payer: Self-pay | Admitting: Family Medicine

## 2010-03-16 ENCOUNTER — Other Ambulatory Visit (HOSPITAL_COMMUNITY)
Admission: RE | Admit: 2010-03-16 | Discharge: 2010-03-16 | Disposition: A | Discharge status: Home / Self Care | Payer: Medicare Other | Source: Ambulatory Visit | Attending: Family Medicine | Admitting: Family Medicine

## 2010-03-16 ENCOUNTER — Ambulatory Visit (INDEPENDENT_AMBULATORY_CARE_PROVIDER_SITE_OTHER): Payer: Medicare Other | Admitting: Family Medicine

## 2010-03-16 ENCOUNTER — Other Ambulatory Visit: Payer: Self-pay | Admitting: Family Medicine

## 2010-03-16 DIAGNOSIS — I10 Essential (primary) hypertension: Secondary | ICD-10-CM

## 2010-03-16 DIAGNOSIS — K219 Gastro-esophageal reflux disease without esophagitis: Secondary | ICD-10-CM

## 2010-03-16 DIAGNOSIS — M79609 Pain in unspecified limb: Secondary | ICD-10-CM | POA: Insufficient documentation

## 2010-03-16 DIAGNOSIS — J309 Allergic rhinitis, unspecified: Secondary | ICD-10-CM

## 2010-03-16 DIAGNOSIS — H547 Unspecified visual loss: Secondary | ICD-10-CM | POA: Insufficient documentation

## 2010-03-16 DIAGNOSIS — Z01419 Encounter for gynecological examination (general) (routine) without abnormal findings: Secondary | ICD-10-CM | POA: Insufficient documentation

## 2010-03-16 DIAGNOSIS — Z0000 Encounter for general adult medical examination without abnormal findings: Secondary | ICD-10-CM

## 2010-03-16 DIAGNOSIS — E119 Type 2 diabetes mellitus without complications: Secondary | ICD-10-CM

## 2010-03-16 DIAGNOSIS — Z124 Encounter for screening for malignant neoplasm of cervix: Secondary | ICD-10-CM

## 2010-03-16 DIAGNOSIS — Z1211 Encounter for screening for malignant neoplasm of colon: Secondary | ICD-10-CM

## 2010-03-16 LAB — CONVERTED CEMR LAB: OCCULT 1: NEGATIVE

## 2010-03-16 LAB — HM DIABETES FOOT EXAM

## 2010-03-18 DIAGNOSIS — J309 Allergic rhinitis, unspecified: Secondary | ICD-10-CM | POA: Insufficient documentation

## 2010-03-20 NOTE — Letter (Signed)
Summary: lab add on  lab add on   Imported By: Luann Bullins 03/14/2010 11:03:45  _____________________________________________________________________  External Attachment:    Type:   Image     Comment:   External Document

## 2010-03-23 ENCOUNTER — Encounter: Payer: Self-pay | Admitting: Family Medicine

## 2010-03-27 NOTE — Letter (Signed)
Summary: Pap Smear, Normal Letter, [facility]  [address]   [place], [place] [address]   Phone: [phone]  Fax: [phone]          March 19, 2010    Dear: Lisa Mann    I am pleased to notify you that your PAP smear was normal.  You will need your next PAP smear in:     ____ 3 Months    ____ 6 Months    ____ 12 Months    Please call the office at our office number above, to schedule your next appointment.    Sincerely,     [facility]
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
## 2010-03-31 ENCOUNTER — Other Ambulatory Visit: Payer: Self-pay | Admitting: Family Medicine

## 2010-04-20 ENCOUNTER — Encounter: Payer: Self-pay | Admitting: Family Medicine

## 2010-05-22 NOTE — Assessment & Plan Note (Signed)
NAME:  RAKEB, KIBBLE                  CHART#:  [id number]   DATE:  03/02/2008                       DOB:  08/03/1933   PRIMARY CARE PHYSICIAN:  Milus Mallick. Lodema Hong, MD   PROBLEM LIST:  1. Heme-positive stools secondary to anti-inflammatory drugs, last      hemoglobin in March 2008 (14).  2. Hypertension.  3. Hyperlipidemia.  4. Diabetes.  5. Cholecystectomy in February 2009 due to biliary dyskinesia.  6. 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  7. Gout.  8. History of breast cancer with lumpectomy in November 2009.  9. Hysterectomy.   SUBJECTIVE:  The patient is a 77 year old female who presents as a  return patient visit.  She has no particular questions, concerns, or  complaints.  She had elevated liver enzymes in April 2009.  Her ALT was  62 and her AST was 44.  Her hepatitis serologies were negative.  She  also had negative serologies for autoimmune hepatitis.  Her ferritin was  403 likely secondary to fatty liver disease.  She was asked to lose 10-  20 pounds.  She presents with no weight loss.  She denies any diarrhea  or constipation.  Any activity helps her bowels to regulate.   MEDICATIONS:  1. Aspirin 81 mg daily.  2. Calcium with vitamin D.  3. Metoprolol.  4. Triamterene/hydrochlorothiazide.  5. Metformin 500 mg b.i.d.  6. Klor-Con daily.  7. Lasix 10 mg daily.  8. Amlodipine 5 mg daily.  9. Omeprazole 20 mg daily.   OBJECTIVE:  VITAL SIGNS:  Weight 207 pounds, height 5 feet 3 inches,  temperature 98, blood pressure 120/88, and pulse 80.GENERAL:  She is in  no apparent distress.  Alert and oriented x4.HEENT:  Atraumatic,  normocephalic.  Pupils equal and react to light.  Mouth, no oral  lesions.LUNGS:  Clear to auscultation bilaterally.CARDIOVASCULAR:  Regular rhythm.  No murmur.ABDOMEN:  Bowel sounds are present, soft,  nontender, nondistended.  No rebound or guarding.  Obese.   ASSESSMENT:  The patient is a 77 year old female who  has elevated liver  enzymes likely secondary to nonalcoholic steatohepatitis.  She continues  to NOT follow a low-fat diet or lose weight.  Thank you for allowing me  to see the patient in consultation.  My recommendations follow.   RECOMMENDATIONS:  1. I recommended to her that she lose weight.  Will recheck her      hepatic function panel today.  2. She has a follow up appointment to see me in 6 months.  I have      given her samples of probiotic to see if it will be more affordable      and that she has the same results with bowel regulation.       Kassie Mends, M.D.  Electronically Signed     SM/MEDQ  D:  03/02/2008  T:  03/03/2008  Job:  [id number]   cc:   Milus Mallick. Lodema Hong, M.D.

## 2010-05-22 NOTE — Assessment & Plan Note (Signed)
NAME:  AMYRAH, PINKHASOV                  CHART#:  [id number]   DATE:  10/08/2006                       DOB:  [birth year]   CHIEF COMPLAINT:  Followup, history of heme positive stools.   SUBJECTIVE:  Lisa Mann is here for a followup.  She was last seen at  time of colonoscopy on 05/19/06, by Dr. Karilyn Cota.  She had a redundant  colon but otherwise normal exam.  She had small external hemorrhoids.  It was felt that her heme positive stools may have been related to NSAID  use.  At the time she was also having some constipation issues.  She  states her bowel movements are much regular now.  She also recently came  off Vytorin, but she is not sure why.  This is through the instruction  of Dr. Lodema Hong.  She denies any melena or rectal bleeding.  No nausea,  vomiting, abdominal pain.  She states she feels well.  She states she  has had followup hemoglobin, has no problems with anemia.  She has a  history of prior polyp with no available pathology and is basically in  the every 5 year followup mode at this point in time.   CURRENT MEDICATIONS:  See updated list.   ALLERGIES:  SELDANE.   PHYSICAL EXAMINATION:  VITAL SIGNS:  Weight is 207, temp 97.5, blood  pressure 138/84, pulse 56.  GENERAL:  Pleasant, well-nourished, well-developed female in no acute  distress.  SKIN:  Warm and dry, no jaundice.  HEENT:  Sclerae nonicteric.  Oropharyngeal mucosa moist and pink.  LUNGS:  Clear to auscultation.  CARDIAC:  Exam reveals regular rate and rhythm.  No murmurs, rubs or  gallops.  ABDOMEN:  Positive bowel sounds, obese, symmetrical, soft.  Nontender.  No organomegaly or masses.   IMPRESSION:  Lisa Mann is a 77 year old lady with a history of  Hemoccult positive stools and constipation.  She is doing very well at  this time.  She reports recent followup labs unremarkable.  Given her  prior history of colonic polyps with no available path, would consider  followup colonoscopy in 2013.   PLAN:  1.  Retrieve most recent hemoglobin results from Dr. Anthony Sar office.  2. Colonoscopy in 2013, to follow up given her history of colon      polyps.       Tana Coast, P.A.  Electronically Signed     Kassie Mends, M.D.  Electronically Signed    LL/MEDQ  D:  10/08/2006  T:  10/08/2006  Job:  [id number]   cc:   Milus Mallick. Lodema Hong, M.D.

## 2010-05-22 NOTE — Assessment & Plan Note (Signed)
NAME:  Lisa Mann, Lisa Mann                  CHART#:  [id number]   DATE:  09/25/2007                       DOB:  05-27-1933   REFERRING PHYSICIAN:  Patrica Duel, MD   PROBLEM LIST:  1. Heme-positive stools investigated in May 2008 by colonoscopy.  2. Hypertension.  3. Hyperlipidemia.  4. Diabetes.  5. Cholecystectomy in February 2009 due to biliary dyskinesia.  6. Chronic NSAID use due to back pain.  7. Gout.  8. History of breast cancer with lumpectomy in November 2008.  9. Hysterectomy.   SUBJECTIVE:  The patient is a 77 year old female who presents as a  return patient visit.  She is walking 3 days a week to most weekends.  She had mildly elevated liver enzymes, which were likely secondary to a  nonalcoholic steatohepatitis.  She loves food, especially home made.  She denies any abdominal pain, nausea, or vomiting.  She has 1 or 2  bowel movements a day.  She eats to comfort herself due to the stress  related to taking care of her husband with Alzheimer's.   MEDICATIONS:  1. Aspirin 81 mg daily.  2. Calcium with vitamin D.  3. Metoprolol.  4. Triamterene/hydrochlorothiazide.  5. Metformin 1 g b.i.d.  6. Allopurinol.  7. Klor-Con.  8. Lasix as needed.  9. Amlodipine.  10.Omeprazole 20 mg daily.   OBJECTIVE:   PHYSICAL EXAMINATION:  VITAL SIGNS:  Weight 270 pounds, height 5 feet 3  inches, BMI 36.7 (severely obese), temperature 97.8, blood pressure  138/70, and pulse 60.  GENERAL:  She is in no apparent distress.  Alert and oriented x4.LUNGS:  Clear to auscultation bilaterally.CARDIOVASCULAR:  Regular rhythm, no  murmur, normal S1 and S2.ABDOMEN:  Bowel sounds are present, soft,  nontender, and nondistended, no rebound or guarding.EXTREMITIES:  Mild  edema.   ASSESSMENT:  The patient is a 77 year old female who has nonalcoholic  steatohepatitis.  She has gained 2 pounds since her last visit.  Her  last liver enzymes in June 2009, showed a ALT of 91 and AST is 79. Thank  you for allowing me to see the patient in consultation.  My  recommendations are follows.   RECOMMENDATIONS:  1. I again encouraged the patient to lose 10-15 pounds.  I believe      with weight loss her liver enzymes will return to normal.  She has      had a negative workup by serologies.  2. She has a return patient visit to see me in February 2010 and will      recheck her liver enzymes at that time.       Kassie Mends, M.D.  Electronically Signed     SM/MEDQ  D:  09/25/2007  T:  09/25/2007  Job:  [id number]   cc:   Patrica Duel, M.D.

## 2010-05-22 NOTE — Op Note (Signed)
NAME:  Lisa Mann, GUASTELLA NO.:  [id number]   MEDICAL RECORD NO.:  [id number]          PATIENT TYPE:  OIB   LOCATION:  A304                          FACILITY:  [facility]   PHYSICIAN:  Barbaraann Barthel, M.D. DATE OF BIRTH:  Oct 29, 1933   DATE OF PROCEDURE:  03/04/2007  DATE OF DISCHARGE:                               OPERATIVE REPORT   SURGEON:  Barbaraann Barthel, M.D.   PREOPERATIVE DIAGNOSIS:  Cholecystitis secondary to biliary dyskinesia   POSTOPERATIVE DIAGNOSIS:  Cholecystitis secondary to biliary dyskinesia   PROCEDURE:  Laparoscopic cholecystectomy.   SPECIMEN:  Gallbladder.   NOTE:  This is a 77 year old black female who had a two year history of  recurrent right upper quadrant pain, nausea and vomiting.  Sonogram  revealed no stones. Hepatobiliary scan, however, showed strong  suggestions of cholecystitis secondary to biliary dyskinesia with an  ejection fraction of 18%.  We discussed the need for surgery with this  patient in detail discussing complications not limited to but including  bleeding, infection, damage to bile ducts, perforation of organs, and  transitory diarrhea.  We also discussed candidly that the results for  cholecystectomy for biliary dyskinesia are not as satisfying as for  cholecystectomy in the presence of cholelithiasis.  Informed consent was  obtained.   GROSS OPERATIVE FINDINGS:  The patient had dense adhesions around the  gallbladder, a small cystic duct which was not cannulated.  Otherwise,  the right upper quadrant apart from fatty infiltration of the liver  appeared to be grossly within normal limits.   TECHNIQUE:  The patient was placed in the supine position. After the  adequate administration of general anesthesia via endotracheal  intubation, her entire abdomen was prepped with Betadine solution and  draped in the usual manner.  A Foley catheter was aseptically inserted.  A periumbilical incision was carried out over the  superior aspect of the  umbilicus.  There were some adhesions around this area; however, we were  able to get around them without any problem.  The patient had a previous  hysterectomy and there were some adhesions around the umbilicus  somewhat. We were able to insert an 11 mm cannula in the umbilicus using  the Visiport technique and under direct vision, we placed an 11 mm  cannula in the epigastrium and two 5 mm cannulae in the right upper  quadrant laterally.  The gallbladder was grasped.  The adhesions were  taken down. The cystic duct was clearly visualized and triply silver  clipped with the three clips on the side of the common bile duct and one  towards the gallbladder.  Likewise, the cystic artery was identified,  triply silver clipped and divided. The gallbladder was then removed  without any spillage using the hook cautery device.  I irrigated with  normal saline solution.  I elected to leave a Jackson-Pratt drain in the  liver bed as well as some Surgicel.  I then examined from the  epigastrium down looking towards the umbilicus.  I did not see any  bleeding or any problems in this area.  We  then desufflated the abdomen  and I closed the fascia in the area of the umbilicus and epigastrium  with 0 Polysorb.  I used 0.5% Sensorcaine at all port sites for  postoperative comfort.  I sutured the drain in place with a 3-0 nylon  and a sterile dressing was applied.  Prior to closure, all sponge,  needle, and instrument counts were found to be correct.  Estimated blood  loss was minimal.  The patient received 1100 mL of crystalloids  intraoperatively.  There were no complications.      Barbaraann Barthel, M.D.  Electronically Signed     WB/MEDQ  D:  03/04/2007  T:  03/04/2007  Job:  [id number]   cc:   Milus Mallick. Lodema Hong, M.D.  Fax: [phone]

## 2010-05-25 NOTE — Op Note (Signed)
NAMEWINRY, Mann NO.:  [id number]   MEDICAL RECORD NO.:  [id number]          PATIENT TYPE:  AMB   LOCATION:  DAY                           FACILITY:  [facility]   PHYSICIAN:  Lionel December, M.D.    DATE OF BIRTH:  1933-02-24   DATE OF PROCEDURE:  05/19/2006  DATE OF DISCHARGE:                               OPERATIVE REPORT   PROCEDURE:  Colonoscopy.   INDICATIONS:  [name] is a 77 year old African American female who  was recently noted to have heme-positive stools by Dr. Lodema Hong.  She  also gives history of constipation.  The patient has history of colonic  polyps.  Her last exam was by Dr. Elpidio Anis in November of 2002.  She  has not had any followup.  The procedure and risks were reviewed with  the patient, and informed consent was obtained.   MEDICATIONS FOR CONSCIOUS SEDATION:  Demerol 50 mg IV, Versed 4 mg IV in  divided dose.   FINDINGS:  The procedure performed in endoscopy suite.  The patient's  vital signs and O2 saturations were monitored during the procedure and  remained stable.  The patient was placed in the left lateral recumbent  position and rectal examination performed.  No abnormality noted on  external or digital exam.  The Pentax videoscope was placed in the  rectum and advanced under vision into the sigmoid colon and beyond.  Redundant colon with excellent prep, except she had some stool in the  rectosigmoid area which was washed and suctioned out.  The scope was  passed into the cecum which was identified by a lipomatous prominent  ileocecal valve.  The blunt end of the cecum was normal.  A picture was  taken of the valve and the blunt end.  As the scope was withdrawn, the  colonic mucosa was carefully examined, and there were no polyps and/or  diverticular changes.  The rectal mucosa similarly was normal.  The  scope was retroflexed to examine the anorectal junction, and small  hemorrhoids were noted below the dentate line.  The  endoscope was  straightened and withdrawn.  The patient tolerated the procedure well.   FINAL DIAGNOSES:  1. Redundant colon but normal examination.  2. Small external hemorrhoids.   Suspect her heme-positive stools may be related to nonsteroidal anti-  inflammatory drug therapy.   RECOMMENDATIONS:  1. The patient will resume her ASA at 81 mg daily but advised to use      naproxen on an as-needed basis.  May even be at lesser dose of 250      mg b.i.d. p.r.n.  2. MiraLax 17 grams p.o. daily along with high-fiber diet.  3. Should she experience upper abdominal pain or melena, she will get      in touch with [place]; otherwise, return for OV in 4-6 weeks.  We will      check her H&H and Hemoccults at that time.      Lionel December, M.D.  Electronically Signed     NR/MEDQ  D:  05/19/2006  T:  05/19/2006  Job:  [id number]   cc:   Milus Mallick. Lodema Hong, M.D.  Fax: [phone]

## 2010-05-25 NOTE — Assessment & Plan Note (Signed)
NAMEMarland Kitchen  SONIYAH, MCGLORY                  CHART#:  [id number]   DATE:  06/30/2007                       DOB:  [birth year]   PROBLEM LIST:  1. Heme-positive stools in May 2008, investigated by colonoscopy.  2. Hypertension.  3. Hyperlipidemia.  4. Diabetes.  5. Chronic NSAID use due to back pain.  6. Gout.  7. History of breast cancer.  8. Hysterectomy.  9. Lumpectomy in November 2008.   SUBJECTIVE:  Ms. Littleton is a 77 year old female who presents as a return  patient visit.  She complains that my hernia bothers me at night.  She  has no nausea, vomiting, or yellow eyes.  She has never had  pancreatitis.  She complains of itching in her back sometimes.  She did  have her gallbladder removed in February 2009, because she was having  problems with vomiting and nausea.  Her gallbladder was removed by Dr.  Malvin Johns.  She has had no problems swallowing.  Her appetite is good.  She states her legs have been swelling.   ALLERGIES:  Seldane.   MEDICATIONS:  1. Aspirin.  2. Calcium.  3. Metoprolol.  4. Triamterene/hydrochlorothiazide.  5. Metformin.  6. Allopurinol.  7. Naproxen 500 mg b.i.d.  8. Klor-Con.  9. Hydrocodone.  10.Hydroxyzine as needed.  11.Flexeril as needed.   FAMILY HISTORY:  She has no family history of liver disease, colon  cancer, or colon polyps.   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  She had been  retired for about 16 years.  She denies any tattoos, and denied any  blood transfusion.  She denies drinking alcohol or smoke tobacco.  She  is married.   OBJECTIVE:   PHYSICAL EXAMINATION:  VITAL SIGNS:  Weight 205 pounds, height 5 feet 3  inches, BMI 36.3 (severely obese), temperature 97.7, blood pressure  120/78, and pulse 64.GENERAL:  She is in no apparent distress.  Alert  and oriented x4.HEENT:  Sclerae anicteric bilaterally.  Atraumatic and  normocephalic.  Pupils equal and reactive to light.  Mouth, no oral  lesions.  Posterior pharynx  without erythema or exudate.  NECK:  Full range of motion.  No lymphadenopathy.LUNGS:  Clear to  auscultation bilaterally.CARDIOVASCULAR:  Regular rhythm.  No murmur.  Normal S1 and S2.ABDOMEN:  Bowel sounds are present, soft, nontender,  and nondistended.  No rebound or guarding.   LABORATORY DATA:  In April 2009, her hemoglobin A1c was 6.4.  Her  hepatitis B surface antigen, hepatitis A antibody IgM, and hepatitis C  antibody were negative.  Her total bilirubin 0.4, alk phos 76, AST 54,  ALT 62, and albumin 4.7.  In June 2009, her total bilirubin was 0.3, alk  phos 65, AST 79, ALT 91, and albumin 4.4.   RADIOGRAPHIC STUDIES:  In February 2009, abdominal ultrasound revealed  normally distended gallbladder with no stones or thickening.  There was  a small amount of sludge.  Common bile duct 3 mm.  Fatty infiltration of  the liver.   ASSESSMENT:  Ms. Guizar is a 77 year old female who has mild  transaminitis.  The differential diagnosis includes non-alcoholic  steatohepatitis, autoimmune hepatitis, and a low likelihood of  hemachromatosis.  Thank you for allowing me to see Ms. Montez Morita on  consultation.  My recommendations are as follows.   RECOMMENDATIONS:  1. Recommended Ms. Buske lose down to 165 pounds, which will create a      BMI of less than 30.  2. Agree with aggressive management of her diabetes and her      cholesterol.  3. Will check quantitative immunoglobulin, ANA, antismooth muscle      antibody, and iron profile today.  4. She is encouraged to strictly follow her diabetic diet and avoid      foods high in fat.  She was given a handout on low-fat diet.  5. She may follow up with me in 3 months.  Will call her when her labs      are available.  She is given her discharge instructions in writing.       Kassie Mends, M.D.  Electronically Signed     SM/MEDQ  D:  06/30/2007  T:  07/01/2007  Job:  [id number]   cc:   Patrica Duel, M.D.
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
## 2010-05-25 NOTE — Consult Note (Signed)
NAME:  Lisa Mann, Lisa Mann NO.:  [id number]   MEDICAL RECORD NO.:  [id number]          PATIENT TYPE:  AMB   LOCATION:  DAY                           FACILITY:  [facility]   PHYSICIAN:  Lionel December, M.D.    DATE OF BIRTH:  [birth year]   DATE OF CONSULTATION:  05/06/2006  DATE OF DISCHARGE:                                 CONSULTATION   REASON FOR CONSULTATION:  Hemoccult positive stool, history of colonic  polyps.   PHYSICIAN REQUESTING CONSULTATION:  Milus Mallick. Lodema Hong, M.D.   Physician co-signing note:  Lionel December, M.D.   HISTORY OF PRESENT ILLNESS:  Lisa Mann is a 77 year old African  American female, a patient of Dr. Lodema Hong, who presents today for  further evaluation of hemoccult positive stools.  She has a history of  chronic intermittent constipation for which she takes occasional stool  softeners.  Otherwise, her bowels move regularly.  Denies any melena or  rectal bleeding.  She does complain of some nonspecific abdominal  bloating.  She rarely has any vomiting.  Denies any heartburn.  She does  have some belching but no nausea or vomiting, dysphagia or odynophagia.  She recently had a CT scan of the abdomen and pelvis which revealed  stable hepatic lesions, since prior study in 2005.  The largest lesion  was in the left lobe of the liver measuring 1.5 x 1 cm.  She had a  persistent nodular enlargement of the left adrenal gland measuring 2.2 x  1.5 cm, unchanged, and abdominal ultrasound on April 01, 2006 as well  revealed probable fatty infiltration of the liver and a tiny hepatic  cyst.   LABS:  Revealed an unremarkable CBC with hemoglobin of 14, BUN 15,  creatinine .68, LFTs normal.  Hemoglobin A1c was 6.6.  On digital rectal  exam, she had heme-positive stools.   CURRENT MEDICATIONS:  1. Aspirin 81 mg daily.  2. Calcium 500 mg daily.  3. Metoprolol 25 mg daily.  4. Triam/hydrochlorothiazide 75/50 mg daily.  5. Vytorin 10/40 mg daily.  6.  Metformin 500 mg b.i.d.  7. Allopurinol 300 mg daily.  8. Naproxen 500 mg b.i.d.  9. Klor-Con 20 mEq daily.  10.Hydrocodone p.r.n.  11.Hydroxyzine 25 mg, one in the morning, two in the evenings and as      needed.  12.Flexeril 5 mg every 8 hours as needed.  13.Lasix 10 mg daily.  14.Promethazine 12.5 mg every 8 hours as needed.   ALLERGIES:  NO KNOWN DRUG ALLERGIES.   PAST MEDICAL HISTORY:  Diabetes mellitus 2 years duration,  hypercholesterolemia, hypertension, history of gout.   PAST SURGICAL HISTORY:  She has had a partial hysterectomy.  In November  2005, she had surgery for left radius fracture.   FAMILY HISTORY:  Negative for colorectal cancer.   SOCIAL HISTORY:  She is married, has three children.  She is retired,  never been a smoker.  No alcohol use.   REVIEW OF SYSTEMS:  See HPI for GI.  CONSTITUTIONAL:  No weight loss.  CARDIOPULMONARY:  No chest pain or shortness of breath.  PHYSICAL EXAM:  VITAL SIGNS:  Weight 202, height 5 feet 3, temp 97.6,  blood pressure 122/56, pulse is 60.  GENERAL:  Pleasant, obese African American female in no acute distress.  SKIN:  Warm and dry.  No jaundice.  HEENT:  Sclerae nonicteric.  Oropharyngeal mucosa moist and pink.  No  lesions, erythema or exudate.  No lymphadenopathy or thyromegaly.  CHEST:  Lungs are clear to auscultation.  CARDIAC:  Exam reveals regular rate and rhythm.  Normal S1-S2.  No  murmurs, rubs or gallops.  ABDOMEN:  Positive bowel sounds, obese but symmetrical, soft.  Nontender.  No organomegaly or masses.  No rebound tenderness or  guarding.  No abdominal bruits or hernias.  EXTREMITIES:  No edema.   IMPRESSION:  Lisa Mann is a pleasant 77 year old lady who presents for  further evaluation and heme-positive stools in the setting of history of  colonic polyps.  Not mentioned above, her last colonoscopy was in  November 2002 by Dr. Elpidio Anis, revealed a single small polyp in the  cecum.  Pathology  unavailable.  She was recommended to follow up every 5  years.  I discussed with her today that heme-positive stools could be  due to colonic polyps; however, given history of aspirin and NSAID use,  she did have an occult GI bleed anywhere along the GI tract.   PLAN:  1. Colonoscopy near future with Dr. Karilyn Cota.  If this is unremarkable,      he may proceed with upper endoscopy at that time.  The patient is      aware and in agreement.  I discussed risk, alternatives, benefits      of both procedures, and she provided verbal consent.  2. Hold aspirin for 4 days prior to procedure.  3. She will take half of her usual dose of metformin the day of prep.  4. Further recommendations to follow.      Tana Coast, P.A.      Lionel December, M.D.  Electronically Signed    LL/MEDQ  D:  05/06/2006  T:  05/06/2006  Job:  [id number]   cc:   Milus Mallick. Lodema Hong, M.D.  Fax: [phone]
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
## 2010-05-25 NOTE — H&P (Signed)
[facility]  Patient:    MAYZIE, CAUGHLIN Visit Number: [id number] MRN: [id number]          Service Type: END Location: DAY Attending Physician:  Dessa Phi Dictated by:   Elpidio Anis, M.D. Admit Date:  12/03/2000                           History and Physical  CHIEF COMPLAINT:  This is a 77 year old female, referred for screening colonoscopy.  HISTORY OF PRESENT ILLNESS:  The patient has a history of constipation but no rectal bleeding.  There is no family history of colon cancer or polyps.  PAST MEDICAL HISTORY:  1.  Hypertension.  2.  Hyperlipidemia.  MEDICATIONS:  1.  Altace 5 mg q.d.  2.  Maxzide 25 mg q.d.  3.  Lipitor 20 mg q.h.s.  ALLERGIES:  1.  SELDANE.  2.  DARVOCET-N.  PHYSICAL EXAMINATION:  VITAL SIGNS:  Blood pressure 130/88, pulse 68, respirations 18.  HEENT:  Normal.  NECK:  Supple without JVD or bruits.  CHEST:  Clear to auscultation.  HEART:  Regular rhythm without murmurs, rubs, or gallops.  ABDOMEN:  Soft, nontender.  No masses.  EXTREMITIES:  Normal.  No clubbing, cyanosis, or edema.  NEUROLOGIC:  Examination nonfocal.  IMPRESSION:  1.  Need for screening colonoscopy.  2.  Hypertension.  3.  Hyperlipidemia.  PLAN:  Screening colonoscopy. Dictated by:   Elpidio Anis, M.D. Attending Physician:  Dessa Phi DD:  12/02/00 TD:  12/03/00 Job: [id number]

## 2010-05-25 NOTE — Procedures (Signed)
   NAME:  Lisa Mann, Lisa Mann                           ACCOUNT NO.:  [id number]   MEDICAL RECORD NO.:  [id number]                   PATIENT TYPE:  OUT   LOCATION:  RAD                                  FACILITY:  [facility]   PHYSICIAN:  Willa Rough, MD [facility]                DATE OF BIRTH:  [birth year]   DATE OF PROCEDURE:  11/03/2001  DATE OF DISCHARGE:                                    STRESS TEST   HISTORY:  This is a 77 year old black female patient with a recent  exertional chest pain, hypertension, and hyperlipidemia.   Baseline EKG:  Sinus bradycardia at 51 beats per minute.  The patient  exercised 5 minutes and 28 seconds with the Bruce protocol obtaining a heart  rate of 150.  Target heart rate was 136.  The test was stopped due to  fatigue.  She had no chest pain.  She did have frequent PVC's and 1-2 mm ST  depression inferolaterally which resolved approximately five minutes into  recovery.  Cardiolite images are to follow.     Jacolyn Reedy, P.A.                       Willa Rough, MD [facility]    ML/MEDQ  D:  11/03/2001  T:  11/04/2001  Job:  [id number]
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
## 2010-06-01 ENCOUNTER — Other Ambulatory Visit: Payer: Self-pay | Admitting: Family Medicine

## 2010-06-06 ENCOUNTER — Telehealth: Payer: Self-pay | Admitting: Family Medicine

## 2010-06-07 NOTE — Telephone Encounter (Signed)
Patient still needs to call office to verify she does use a specific company. We cannot take the companies word for it

## 2010-06-25 ENCOUNTER — Other Ambulatory Visit: Payer: Self-pay | Admitting: Family Medicine

## 2010-06-29 ENCOUNTER — Other Ambulatory Visit: Payer: Self-pay | Admitting: Family Medicine

## 2010-07-18 ENCOUNTER — Other Ambulatory Visit: Payer: Self-pay | Admitting: *Deleted

## 2010-07-18 MED ORDER — METOPROLOL SUCCINATE ER 25 MG PO TB24: 25.0000 mg | ORAL_TABLET | Freq: Every day | ORAL | Status: DC

## 2010-07-19 ENCOUNTER — Other Ambulatory Visit: Payer: Self-pay | Admitting: Family Medicine

## 2010-07-23 ENCOUNTER — Other Ambulatory Visit: Payer: Self-pay | Admitting: Family Medicine

## 2010-07-23 ENCOUNTER — Encounter: Payer: Self-pay | Admitting: Family Medicine

## 2010-07-26 ENCOUNTER — Other Ambulatory Visit: Payer: Self-pay | Admitting: Family Medicine

## 2010-07-27 LAB — HEMOGLOBIN A1C
Hgb A1c MFr Bld: 6.4 % — ABNORMAL HIGH (ref <5.7)
Mean Plasma Glucose: 137 mg/dL — ABNORMAL HIGH (ref <117)

## 2010-07-29 ENCOUNTER — Other Ambulatory Visit: Payer: Self-pay | Admitting: Family Medicine

## 2010-07-31 ENCOUNTER — Ambulatory Visit (INDEPENDENT_AMBULATORY_CARE_PROVIDER_SITE_OTHER): Payer: Medicare Other | Admitting: Family Medicine

## 2010-07-31 ENCOUNTER — Encounter: Payer: Self-pay | Admitting: Family Medicine

## 2010-07-31 VITALS — BP 130/70 | HR 81 | Resp 16 | Ht 63.25 in | Wt 196.1 lb

## 2010-07-31 DIAGNOSIS — R5381 Other malaise: Secondary | ICD-10-CM

## 2010-07-31 DIAGNOSIS — E119 Type 2 diabetes mellitus without complications: Secondary | ICD-10-CM

## 2010-07-31 DIAGNOSIS — E669 Obesity, unspecified: Secondary | ICD-10-CM

## 2010-07-31 DIAGNOSIS — M109 Gout, unspecified: Secondary | ICD-10-CM

## 2010-07-31 DIAGNOSIS — I10 Essential (primary) hypertension: Secondary | ICD-10-CM

## 2010-07-31 DIAGNOSIS — E785 Hyperlipidemia, unspecified: Secondary | ICD-10-CM

## 2010-07-31 MED ORDER — AMLODIPINE BESYLATE 5 MG PO TABS: 5.0000 mg | ORAL_TABLET | Freq: Every day | ORAL | Status: DC

## 2010-07-31 NOTE — Patient Instructions (Signed)
F/u in 3.5 months.  It is important that you exercise regularly at least 30 minutes 5 times a week. If you develop chest pain, have severe difficulty breathing, or feel very tired, stop exercising immediately and seek medical attention   A healthy diet is rich in fruit, vegetables and whole grains. Poultry fish, nuts and beans are a healthy choice for protein rather then red meat. A low sodium diet and drinking 64 ounces of water daily is generally recommended. Oils and sweet should be limited. Carbohydrates especially for those who are diabetic or overweight, should be limited to 34-45 gram per meal. It is important to eat on a regular schedule, at least 3 times daily. Snacks should be primarily fruits, vegetables or nuts.  Pls try to lose 5 to 7 pounds in the next weeks   Fasting lipid, egfr, cmp, HBA1C  Just before next visit

## 2010-07-31 NOTE — Progress Notes (Signed)
  Subjective:    Patient ID: Lisa Mann, female    DOB: Aug 01, 1933, 77 y.o.   MRN: [id number]  HPI HYPERTENSION Disease Monitoring Blood pressure range-120/70 Chest pain- no      Dyspnea- no Medications Compliance- good Lightheadedness- no   Edema- no   DIABETES Disease Monitoring Blood Sugar ranges-fasting daily average 115 to 125 Polyuria- no New Visual problems- no Medications Compliance- good  Hypoglycemic symptoms- no   HYPERLIPIDEMIA Disease Monitoring See symptoms for Hypertension Medications Compliance- n/a RUQ pain- no  Muscle aches- no  ROS See HPI above   PMH Smoking Status noted     Review of Systems Denies recent fever or chills. Denies sinus pressure, nasal congestion, ear pain or sore throat. Denies chest congestion, productive cough or wheezing. Denies chest pains, palpitations and leg swelling Denies abdominal pain, nausea, vomiting,diarrhea or constipation.   Denies dysuria, frequency, hesitancy or incontinence. Denies joint pain, swelling and limitation in mobility. Denies headaches, seizures, numbness, or tingling. Denies depression, anxiety or insomnia. Denies skin break down or rash.        Objective:   Physical Exam Patient alert and oriented and in no cardiopulmonary distress.  HEENT: No facial asymmetry, EOMI, no sinus tenderness,  oropharynx pink and moist.  Neck supple no adenopathy.  Chest: Clear to auscultation bilaterally.  CVS: S1, S2 no murmurs, no S3.  ABD: Soft non tender. Bowel sounds normal.  Ext: No edema  MS: Adequate ROM spine, shoulders, hips and knees.  Skin: Intact, no ulcerations or rash noted.  Psych: Good eye contact, normal affect. Memory intact not anxious or depressed appearing.  CNS: CN 2-12 intact, power, tone and sensation normal throughout.        Assessment & Plan:

## 2010-08-19 NOTE — Assessment & Plan Note (Addendum)
Pt with NASH and uncontrolled hyperlipidemia, low fat diet encouraged, will consider re instituting a lipid lowering medication if hepatic panel is normal

## 2010-08-19 NOTE — Assessment & Plan Note (Signed)
Unchanged. Patient re-educated about  the importance of commitment to a  minimum of 150 minutes of exercise per week. The importance of healthy food choices with portion control discussed. Encouraged to start a food diary, count calories and to consider  joining a support group. Sample diet sheets offered. Goals set by the patient for the next several months.    

## 2010-08-19 NOTE — Assessment & Plan Note (Signed)
Controlled, no change in medication  

## 2010-08-20 ENCOUNTER — Other Ambulatory Visit: Payer: Self-pay | Admitting: Family Medicine

## 2010-08-25 ENCOUNTER — Other Ambulatory Visit: Payer: Self-pay | Admitting: Family Medicine

## 2010-09-03 ENCOUNTER — Telehealth: Payer: Self-pay | Admitting: Family Medicine

## 2010-09-03 NOTE — Telephone Encounter (Signed)
I do not see any messages where patient has called requesting we fill out a form for her to get diabetic shoes

## 2010-09-17 ENCOUNTER — Telehealth: Payer: Self-pay | Admitting: Family Medicine

## 2010-09-17 NOTE — Telephone Encounter (Signed)
Patient has not requested any diabetic shoes. This company has been calling for months

## 2010-09-28 LAB — BASIC METABOLIC PANEL
BUN: 8
CO2: 31
Calcium: 9.4
Chloride: 102
Creatinine, Ser: 0.64
GFR calc Af Amer: >60
GFR calc non Af Amer: >60
Glucose, Bld: 122 — ABNORMAL HIGH
Potassium: 3.9
Sodium: 138

## 2010-09-28 LAB — DIFFERENTIAL
Basophils Absolute: 0
Basophils Absolute: 0
Basophils Relative: 0
Basophils Relative: 1
Eosinophils Absolute: 0
Eosinophils Absolute: 0.1
Eosinophils Relative: 0
Eosinophils Relative: 3
Lymphocytes Relative: 12
Lymphocytes Relative: 24
Lymphs Abs: 0.8
Lymphs Abs: 1.1
Monocytes Absolute: 0.4
Monocytes Absolute: 0.5
Monocytes Relative: 10
Monocytes Relative: 7
Neutro Abs: 2.8
Neutro Abs: 5.1
Neutrophils Relative %: 62
Neutrophils Relative %: 81 — ABNORMAL HIGH

## 2010-09-28 LAB — CBC
HCT: 36.9
HCT: 39.8
Hemoglobin: 12.6
Hemoglobin: 13.5
MCHC: 33.8
MCHC: 34.1
MCV: 88.3
MCV: 88.7
Platelets: 252
Platelets: 293
RBC: 4.18
RBC: 4.49
RDW: 16 — ABNORMAL HIGH
RDW: 16.1 — ABNORMAL HIGH
WBC: 4.5
WBC: 6.4

## 2010-09-28 LAB — HEPATIC FUNCTION PANEL
ALT: 83 — ABNORMAL HIGH
AST: 69 — ABNORMAL HIGH
Albumin: 3.2 — ABNORMAL LOW
Alkaline Phosphatase: 56
Bilirubin, Direct: 0.1
Indirect Bilirubin: 0.3
Total Bilirubin: 0.4
Total Protein: 6.4

## 2010-10-20 ENCOUNTER — Other Ambulatory Visit: Payer: Self-pay | Admitting: Family Medicine

## 2010-10-27 ENCOUNTER — Other Ambulatory Visit: Payer: Self-pay | Admitting: Family Medicine

## 2010-11-06 ENCOUNTER — Other Ambulatory Visit: Payer: Self-pay | Admitting: Family Medicine

## 2010-11-06 ENCOUNTER — Telehealth: Payer: Self-pay | Admitting: Family Medicine

## 2010-11-09 ENCOUNTER — Encounter: Payer: Self-pay | Admitting: Family Medicine

## 2010-11-09 NOTE — Telephone Encounter (Signed)
Pls let pt know I will do foot exam for diabetic shoes at next visit, then I can send off the form, no foot exam is documented in the past 6 months.  Tell company need to wait on this pls

## 2010-11-09 NOTE — Telephone Encounter (Signed)
Left message with patient

## 2010-11-13 ENCOUNTER — Encounter: Payer: Self-pay | Admitting: Family Medicine

## 2010-11-13 LAB — COMPLETE METABOLIC PANEL WITH GFR
ALT: 45 U/L — ABNORMAL HIGH (ref 0–35)
AST: 52 U/L — ABNORMAL HIGH (ref 0–37)
Albumin: 4.5 g/dL (ref 3.5–5.2)
Alkaline Phosphatase: 57 U/L (ref 39–117)
BUN: 14 mg/dL (ref 6–23)
CO2: 28 mEq/L (ref 19–32)
Calcium: 9.7 mg/dL (ref 8.4–10.5)
Chloride: 96 mEq/L (ref 96–112)
Creat: 0.63 mg/dL (ref 0.50–1.10)
GFR, Est African American: >89 mL/min (ref >60)
GFR, Est Non African American: 89 mL/min (ref >60)
Glucose, Bld: 110 mg/dL — ABNORMAL HIGH (ref 70–99)
Potassium: 4.4 mEq/L (ref 3.5–5.3)
Sodium: 136 mEq/L (ref 135–145)
Total Bilirubin: 0.3 mg/dL (ref 0.3–1.2)
Total Protein: 7.3 g/dL (ref 6.0–8.3)

## 2010-11-13 LAB — LIPID PANEL
Cholesterol: 231 mg/dL — ABNORMAL HIGH (ref 0–200)
HDL: 50 mg/dL (ref >39)
LDL Cholesterol: 156 mg/dL — ABNORMAL HIGH (ref 0–99)
Total CHOL/HDL Ratio: 4.6 Ratio
Triglycerides: 124 mg/dL (ref <150)
VLDL: 25 mg/dL (ref 0–40)

## 2010-11-13 LAB — HEMOGLOBIN A1C
Hgb A1c MFr Bld: 6.7 % — ABNORMAL HIGH (ref <5.7)
Hgb A1c MFr Bld: 6.7 % — ABNORMAL HIGH (ref <5.7)
Mean Plasma Glucose: 146 mg/dL — ABNORMAL HIGH (ref <117)
Mean Plasma Glucose: 146 mg/dL — ABNORMAL HIGH (ref <117)

## 2010-11-13 LAB — URIC ACID: Uric Acid, Serum: 3.1 mg/dL (ref 2.4–7.0)

## 2010-11-14 ENCOUNTER — Encounter: Payer: Self-pay | Admitting: Family Medicine

## 2010-11-14 ENCOUNTER — Ambulatory Visit (INDEPENDENT_AMBULATORY_CARE_PROVIDER_SITE_OTHER): Payer: Medicare Other | Admitting: Family Medicine

## 2010-11-14 VITALS — BP 122/80 | HR 82 | Resp 16 | Ht 63.5 in | Wt 196.8 lb

## 2010-11-14 DIAGNOSIS — Z862 Personal history of diseases of the blood and blood-forming organs and certain disorders involving the immune mechanism: Secondary | ICD-10-CM

## 2010-11-14 DIAGNOSIS — E119 Type 2 diabetes mellitus without complications: Secondary | ICD-10-CM

## 2010-11-14 DIAGNOSIS — E785 Hyperlipidemia, unspecified: Secondary | ICD-10-CM

## 2010-11-14 DIAGNOSIS — E669 Obesity, unspecified: Secondary | ICD-10-CM

## 2010-11-14 DIAGNOSIS — Z8639 Personal history of other endocrine, nutritional and metabolic disease: Secondary | ICD-10-CM

## 2010-11-14 DIAGNOSIS — I10 Essential (primary) hypertension: Secondary | ICD-10-CM

## 2010-11-14 DIAGNOSIS — Z23 Encounter for immunization: Secondary | ICD-10-CM

## 2010-11-14 DIAGNOSIS — K7581 Nonalcoholic steatohepatitis (NASH): Secondary | ICD-10-CM

## 2010-11-14 DIAGNOSIS — K7689 Other specified diseases of liver: Secondary | ICD-10-CM

## 2010-11-14 NOTE — Progress Notes (Signed)
  Subjective:    Patient ID: Lisa Mann, female    DOB: 1933-04-15, 77 y.o.   MRN: [id number]  HPI The PT is here for follow up and re-evaluation of chronic medical conditions, medication management and review of any available recent lab and radiology data.  Preventive health is updated, specifically  Cancer screening and Immunization.   Questions or concerns regarding consultations or procedures which the PT has had in the interim are  addressed. The PT denies any adverse reactions to current medications since the last visit.  Requests script for diabetic shoes, has not had a pr for approx 2 years, c/o very poor sensation in her feet.    Review of Systems See HPI ,Denies recent fever or chills. Denies sinus pressure, nasal congestion, ear pain or sore throat. Denies chest congestion, productive cough or wheezing. Denies chest pains, palpitations and leg swelling Denies abdominal pain, nausea, vomiting,diarrhea or constipation.   Denies dysuria, frequency, hesitancy or incontinence. Denies joint pain, swelling and limitation in mobility. Denies headaches, seizures, numbness, or tingling. Denies depression, anxiety or insomnia. Denies skin break down or rash.        Objective:   Physical Exam  Patient alert and oriented and in no cardiopulmonary distress.  HEENT: No facial asymmetry, EOMI, no sinus tenderness,  oropharynx pink and moist.  Neck supple no adenopathy.  Chest: Clear to auscultation bilaterally.  CVS: S1, S2 no murmurs, no S3.  ABD: Soft non tender. Bowel sounds normal.  Ext: No edema  MS: Adequate ROM spine, shoulders, hips and knees.  Skin: Intact, no ulcerations . Psych: Good eye contact, normal affect. Memory intact not anxious or depressed appearing.  CNS: CN 2-12 intact, power, tone and sensation normal throughout. Diabetic Foot Check:  Appearance -  Calluses present  Skin - no unusual pallor or redness Sensation - grossly intact to light  touch Monofilament testing -  Right - Great toe, medial, central, lateral ball and posterior foot diminished Left - Great toe, medial, central, lateral ball and posterior foot  diminished Pulses Left - Dorsalis Pedis and Posterior Tibia normal Right - Dorsalis Pedis and Posterior Tibia normal       Assessment & Plan:

## 2010-11-14 NOTE — Patient Instructions (Addendum)
F/u in 4 months.  Please work on dietary change and regular exercise to improve your health as we discussed.  Both your cholesterol and liver enzymes are high, you need to lose weight.  Please start measuring the servings of potato.  Fasting lipid, hepatic and HBA1C in 4 months LABWORK  NEEDS TO BE DONE BETWEEN 3 TO 7 DAYS BEFORE YOUR NEXT SCEDULED  VISIT.  THIS WILL IMPROVE THE QUALITY OF YOUR CARE.   Prescription provided for you to get your diabetic shoes locally.  pls call for eye exam next year when due

## 2010-11-14 NOTE — Assessment & Plan Note (Signed)
unchanged Patient re-educated about  the importance of commitment to a  minimum of 150 minutes of exercise per week. The importance of healthy food choices with portion control discussed. Encouraged to start a food diary, count calories and to consider  joining a support group. Sample diet sheets offered. Goals set by the patient for the next several months.    

## 2010-11-14 NOTE — Assessment & Plan Note (Signed)
Uncontrolled, low fat diet  Discussed and encouraged

## 2010-11-14 NOTE — Assessment & Plan Note (Signed)
Controlled but not as good as in the past, closer attention to diet , exercise and weight loss stressed

## 2010-11-14 NOTE — Assessment & Plan Note (Signed)
Deteriorated, has been seen in the past by [facility]I, likely NASH, weight loss encouraged and dietary change

## 2010-11-16 ENCOUNTER — Other Ambulatory Visit: Payer: Self-pay | Admitting: Family Medicine

## 2010-12-04 ENCOUNTER — Telehealth: Payer: Self-pay | Admitting: Family Medicine

## 2010-12-04 NOTE — Telephone Encounter (Signed)
Coughing and sneezing, no congestion or fever. Recommended claritin or zyrtec and robitussin sugar free DM and to call later in the week if no better

## 2010-12-06 ENCOUNTER — Emergency Department (HOSPITAL_COMMUNITY): Payer: Medicare Other

## 2010-12-06 ENCOUNTER — Telehealth: Payer: Self-pay | Admitting: Family Medicine

## 2010-12-06 ENCOUNTER — Emergency Department (HOSPITAL_COMMUNITY)
Admission: EM | Admit: 2010-12-06 | Discharge: 2010-12-06 | Disposition: A | Discharge status: Home / Self Care | Payer: Medicare Other | Attending: Emergency Medicine | Admitting: Emergency Medicine

## 2010-12-06 ENCOUNTER — Encounter (HOSPITAL_COMMUNITY): Payer: Self-pay | Admitting: *Deleted

## 2010-12-06 DIAGNOSIS — E785 Hyperlipidemia, unspecified: Secondary | ICD-10-CM | POA: Insufficient documentation

## 2010-12-06 DIAGNOSIS — M1990 Unspecified osteoarthritis, unspecified site: Secondary | ICD-10-CM | POA: Insufficient documentation

## 2010-12-06 DIAGNOSIS — Z7982 Long term (current) use of aspirin: Secondary | ICD-10-CM | POA: Insufficient documentation

## 2010-12-06 DIAGNOSIS — R05 Cough: Secondary | ICD-10-CM | POA: Insufficient documentation

## 2010-12-06 DIAGNOSIS — I10 Essential (primary) hypertension: Secondary | ICD-10-CM | POA: Insufficient documentation

## 2010-12-06 DIAGNOSIS — Z87828 Personal history of other (healed) physical injury and trauma: Secondary | ICD-10-CM | POA: Insufficient documentation

## 2010-12-06 DIAGNOSIS — Z853 Personal history of malignant neoplasm of breast: Secondary | ICD-10-CM | POA: Insufficient documentation

## 2010-12-06 DIAGNOSIS — K219 Gastro-esophageal reflux disease without esophagitis: Secondary | ICD-10-CM | POA: Insufficient documentation

## 2010-12-06 DIAGNOSIS — Z9089 Acquired absence of other organs: Secondary | ICD-10-CM | POA: Insufficient documentation

## 2010-12-06 DIAGNOSIS — R059 Cough, unspecified: Secondary | ICD-10-CM | POA: Insufficient documentation

## 2010-12-06 DIAGNOSIS — E119 Type 2 diabetes mellitus without complications: Secondary | ICD-10-CM | POA: Insufficient documentation

## 2010-12-06 DIAGNOSIS — J069 Acute upper respiratory infection, unspecified: Secondary | ICD-10-CM

## 2010-12-06 LAB — DIFFERENTIAL
Basophils Absolute: 0 10*3/uL (ref 0.0–0.1)
Basophils Relative: 0 % (ref 0–1)
Eosinophils Absolute: 0 10*3/uL (ref 0.0–0.7)
Eosinophils Relative: 0 % (ref 0–5)
Lymphocytes Relative: 17 % (ref 12–46)
Lymphs Abs: 1.2 10*3/uL (ref 0.7–4.0)
Monocytes Absolute: 0.7 10*3/uL (ref 0.1–1.0)
Monocytes Relative: 10 % (ref 3–12)
Neutro Abs: 5.1 10*3/uL (ref 1.7–7.7)
Neutrophils Relative %: 73 % (ref 43–77)

## 2010-12-06 LAB — BASIC METABOLIC PANEL
BUN: 11 mg/dL (ref 6–23)
CO2: 29 mEq/L (ref 19–32)
Calcium: 9.6 mg/dL (ref 8.4–10.5)
Chloride: 93 mEq/L — ABNORMAL LOW (ref 96–112)
Creatinine, Ser: 0.76 mg/dL (ref 0.50–1.10)
GFR calc Af Amer: >90 mL/min (ref >90)
GFR calc non Af Amer: 80 mL/min — ABNORMAL LOW (ref >90)
Glucose, Bld: 119 mg/dL — ABNORMAL HIGH (ref 70–99)
Potassium: 3.9 mEq/L (ref 3.5–5.1)
Sodium: 132 mEq/L — ABNORMAL LOW (ref 135–145)

## 2010-12-06 LAB — URINE MICROSCOPIC-ADD ON

## 2010-12-06 LAB — URINALYSIS, ROUTINE W REFLEX MICROSCOPIC
Bilirubin Urine: NEGATIVE
Glucose, UA: NEGATIVE mg/dL
Ketones, ur: NEGATIVE mg/dL
Leukocytes, UA: NEGATIVE
Nitrite: NEGATIVE
Protein, ur: NEGATIVE mg/dL
Specific Gravity, Urine: 1.01 (ref 1.005–1.030)
Urobilinogen, UA: 0.2 mg/dL (ref 0.0–1.0)
pH: 7 (ref 5.0–8.0)

## 2010-12-06 LAB — CBC
HCT: 37 % (ref 36.0–46.0)
Hemoglobin: 12.3 g/dL (ref 12.0–15.0)
MCH: 28.7 pg (ref 26.0–34.0)
MCHC: 33.2 g/dL (ref 30.0–36.0)
MCV: 86.2 fL (ref 78.0–100.0)
Platelets: 233 10*3/uL (ref 150–400)
RBC: 4.29 MIL/uL (ref 3.87–5.11)
RDW: 15.8 % — ABNORMAL HIGH (ref 11.5–15.5)
WBC: 7 10*3/uL (ref 4.0–10.5)

## 2010-12-06 MED ORDER — ALBUTEROL SULFATE (5 MG/ML) 0.5% IN NEBU
5.0000 mg | INHALATION_SOLUTION | Freq: Once | RESPIRATORY_TRACT | Status: AC
  Administered 2010-12-06: 5 mg via RESPIRATORY_TRACT
  Filled 2010-12-06: qty 1

## 2010-12-06 MED ORDER — PREDNISONE 10 MG PO TABS: 20.0000 mg | ORAL_TABLET | Freq: Every day | ORAL | Status: AC

## 2010-12-06 MED ORDER — ACETAMINOPHEN 500 MG PO TABS
1000.0000 mg | ORAL_TABLET | Freq: Once | ORAL | Status: AC
  Administered 2010-12-06: 1000 mg via ORAL
  Filled 2010-12-06: qty 2

## 2010-12-06 MED ORDER — PREDNISONE 20 MG PO TABS
60.0000 mg | ORAL_TABLET | Freq: Once | ORAL | Status: AC
  Administered 2010-12-06: 60 mg via ORAL
  Filled 2010-12-06: qty 3

## 2010-12-06 NOTE — ED Notes (Signed)
Pt c/o cough, congestion, ? Fever since Monday. States that her mucous is clear. Also c/o abdominal swelling. Denies nausea, vomiting or diarrhea.
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
## 2010-12-06 NOTE — Telephone Encounter (Signed)
Spoke with son who stated he would take his mother to [facility] ED "if she could make it" 3 day h/o cough, chest congestion, fever, chills," can hardly walk"I advised to take her to

## 2010-12-06 NOTE — ED Notes (Signed)
Pt reports severe cough, fever, and swelling to her abdomen.  Pt denies any sob, hematuria, or blood in her stools.  Pt reports that her swelling to her abdomen has gradually worsened.  Pt reports decreased appetite.

## 2010-12-06 NOTE — Telephone Encounter (Signed)
I spoke with the ED Doc at [facility] directly to let him know pt on the way and a brief summary of her symptoms

## 2010-12-08 ENCOUNTER — Other Ambulatory Visit: Payer: Self-pay | Admitting: Family Medicine

## 2010-12-08 ENCOUNTER — Telehealth: Payer: Self-pay | Admitting: Family Medicine

## 2010-12-08 MED ORDER — IPRATROPIUM-ALBUTEROL 0.5-2.5 (3) MG/3ML IN SOLN: 3.0000 mL | Freq: Four times a day (QID) | RESPIRATORY_TRACT | Status: DC | PRN

## 2010-12-08 MED ORDER — PROMETHAZINE-DM 6.25-15 MG/5ML PO SYRP: 5.0000 mL | ORAL_SOLUTION | Freq: Two times a day (BID) | ORAL | Status: AC | PRN

## 2010-12-08 NOTE — Telephone Encounter (Signed)
C/o excessive cough in her mother Monicia, states hand held MDI not helping, only the breathing treatment she had got in the Ed, has a neb machine and is requesting neb solution for her Mom. Also requests cough suppressant. Will send in both and I advised her to sched Ed f/u for her Mom next week if not already done

## 2010-12-10 ENCOUNTER — Ambulatory Visit (INDEPENDENT_AMBULATORY_CARE_PROVIDER_SITE_OTHER): Payer: Medicare Other | Admitting: Family Medicine

## 2010-12-10 ENCOUNTER — Encounter: Payer: Self-pay | Admitting: Family Medicine

## 2010-12-10 VITALS — BP 130/70 | HR 60 | Resp 18 | Ht 63.5 in | Wt 189.1 lb

## 2010-12-10 DIAGNOSIS — E119 Type 2 diabetes mellitus without complications: Secondary | ICD-10-CM

## 2010-12-10 DIAGNOSIS — J40 Bronchitis, not specified as acute or chronic: Secondary | ICD-10-CM

## 2010-12-10 DIAGNOSIS — E785 Hyperlipidemia, unspecified: Secondary | ICD-10-CM

## 2010-12-10 DIAGNOSIS — C50919 Malignant neoplasm of unspecified site of unspecified female breast: Secondary | ICD-10-CM | POA: Insufficient documentation

## 2010-12-10 DIAGNOSIS — I10 Essential (primary) hypertension: Secondary | ICD-10-CM

## 2010-12-10 MED ORDER — BENZONATATE 100 MG PO CAPS: 100.0000 mg | ORAL_CAPSULE | Freq: Four times a day (QID) | ORAL | Status: DC | PRN

## 2010-12-10 MED ORDER — PENICILLIN V POTASSIUM 500 MG PO TABS: 500.0000 mg | ORAL_TABLET | Freq: Three times a day (TID) | ORAL | Status: AC

## 2010-12-10 NOTE — Progress Notes (Signed)
  Subjective:    Patient ID: Lisa Mann, female    DOB: 11-18-1933, 77 y.o.   MRN: [id number]  HPI  Pt reports head and chest congestion since last week Tuesday after raking leaves. 2 days later went to the eD  Due increased weakness and fatigue, also reports now having yellow sputum and yellow nasal drainage, denies sore throat or ear pain  Has had intermittent chills, on n antibiotics up to present time. Had called in over the past weekend for neb solutions, since she was experiencing a lot of wheezing and chest tightness, and this was noted to improve with breathing treatment she had received in the Ed. Since getting and using home nebs she states breathing has improved. Has concerns about possible memory loss which her daughter had also alluded to, however, mini mental exam is excellent  Review of Systems See HPI Denies chest pains, palpitations and leg swelling Denies abdominal pain, nausea, vomiting,diarrhea or constipation.   Denies dysuria, frequency, hesitancy or incontinence. Denies joint pain, swelling and limitation in mobility. Denies headaches, seizures, numbness, or tingling. Denies depression, anxiety or insomnia. Denies skin break down or rash.        Objective:   Physical Exam  Patient alert and oriented and in no cardiopulmonary distress.  HEENT: No facial asymmetry, EOMI, maxillary  sinus tenderness,  oropharynx pink and moist.  Neck supple no adenopathy.  Chest: decreased air entry scattered crackles, few wheezes  CVS: S1, S2 no murmurs, no S3.  ABD: Soft non tender. Bowel sounds normal.  Ext: No edema  MS: Adequate ROM spine, shoulders, hips and knees.  Skin: Intact, no ulcerations or rash noted.  Psych: Good eye contact, normal affect. Memory intact not anxious or depressed appearing.  CNS: CN 2-12 intact, power, tone and sensation normal throughout.      Assessment & Plan:

## 2010-12-10 NOTE — Patient Instructions (Signed)
F/u in mid February.  Pls call if you have problems before.   Penicillin is prescribed for 10 days.   Your test of memory today is excellent, I encourage you to join the seniors group and be active in your [place], also read a lot, do puzzles, keep up with the news. No need for medication at this time.Please discuss with your daughter, encourage her to accompany you to the next visit if she still has concerns about this.  Only use the cough suppressant if you are coughing excessively and preferably at night , it has a sedative side effect

## 2010-12-16 NOTE — Assessment & Plan Note (Signed)
Controlled, no change in medication  

## 2010-12-16 NOTE — Assessment & Plan Note (Signed)
Acute bronchitis, reports breathing best helped with use of neb treatment and is using at least twice daily.antibiotic course also prescribed at this visit

## 2010-12-16 NOTE — Assessment & Plan Note (Signed)
Uncontrolled, diet controlled only

## 2010-12-26 ENCOUNTER — Telehealth: Payer: Self-pay | Admitting: Family Medicine

## 2010-12-26 ENCOUNTER — Other Ambulatory Visit: Payer: Self-pay | Admitting: Family Medicine

## 2010-12-26 DIAGNOSIS — M25473 Effusion, unspecified ankle: Secondary | ICD-10-CM

## 2010-12-26 NOTE — Telephone Encounter (Signed)
pls refer to dr per pt request I will put in order.Let her know

## 2010-12-27 NOTE — Telephone Encounter (Signed)
Patient is aware 

## 2011-01-10 DIAGNOSIS — E1149 Type 2 diabetes mellitus with other diabetic neurological complication: Secondary | ICD-10-CM | POA: Diagnosis not present

## 2011-01-10 DIAGNOSIS — E119 Type 2 diabetes mellitus without complications: Secondary | ICD-10-CM | POA: Diagnosis not present

## 2011-01-14 ENCOUNTER — Telehealth: Payer: Self-pay | Admitting: Family Medicine

## 2011-01-15 NOTE — Telephone Encounter (Signed)
Already filled out and faxed back paper that they sent [place] for testing supplies

## 2011-01-21 ENCOUNTER — Other Ambulatory Visit: Payer: Self-pay | Admitting: Family Medicine

## 2011-01-22 ENCOUNTER — Other Ambulatory Visit: Payer: Self-pay

## 2011-01-22 MED ORDER — POTASSIUM CHLORIDE CRYS ER 20 MEQ PO TBCR: EXTENDED_RELEASE_TABLET | ORAL | Status: DC

## 2011-01-26 ENCOUNTER — Other Ambulatory Visit: Payer: Self-pay | Admitting: Family Medicine

## 2011-02-06 DIAGNOSIS — C50919 Malignant neoplasm of unspecified site of unspecified female breast: Secondary | ICD-10-CM | POA: Diagnosis not present

## 2011-02-11 ENCOUNTER — Other Ambulatory Visit: Payer: Self-pay | Admitting: Family Medicine

## 2011-02-19 ENCOUNTER — Other Ambulatory Visit: Payer: Self-pay | Admitting: Family Medicine

## 2011-02-19 ENCOUNTER — Encounter: Payer: Self-pay | Admitting: Family Medicine

## 2011-02-19 ENCOUNTER — Ambulatory Visit (INDEPENDENT_AMBULATORY_CARE_PROVIDER_SITE_OTHER): Payer: Medicare Other | Admitting: Family Medicine

## 2011-02-19 VITALS — BP 140/80 | HR 72 | Resp 16 | Ht 63.5 in | Wt 198.4 lb

## 2011-02-19 DIAGNOSIS — J309 Allergic rhinitis, unspecified: Secondary | ICD-10-CM

## 2011-02-19 DIAGNOSIS — E669 Obesity, unspecified: Secondary | ICD-10-CM | POA: Diagnosis not present

## 2011-02-19 DIAGNOSIS — G620 Drug-induced polyneuropathy: Secondary | ICD-10-CM | POA: Insufficient documentation

## 2011-02-19 DIAGNOSIS — I10 Essential (primary) hypertension: Secondary | ICD-10-CM | POA: Diagnosis not present

## 2011-02-19 DIAGNOSIS — Z853 Personal history of malignant neoplasm of breast: Secondary | ICD-10-CM

## 2011-02-19 DIAGNOSIS — K219 Gastro-esophageal reflux disease without esophagitis: Secondary | ICD-10-CM

## 2011-02-19 MED ORDER — TRIAMTERENE-HCTZ 75-50 MG PO TABS: ORAL_TABLET | ORAL | Status: DC

## 2011-02-19 MED ORDER — METFORMIN HCL 1000 MG PO TABS: ORAL_TABLET | ORAL | Status: DC

## 2011-02-19 MED ORDER — GABAPENTIN 100 MG PO CAPS: ORAL_CAPSULE | ORAL | Status: DC

## 2011-02-19 MED ORDER — ALLOPURINOL 300 MG PO TABS: ORAL_TABLET | ORAL | Status: DC

## 2011-02-19 NOTE — Assessment & Plan Note (Signed)
Sub optimal control, no med change , with weight loss and dietary change, value should hopefully normalize

## 2011-02-19 NOTE — Progress Notes (Signed)
  Subjective:    Patient ID: Lisa Mann, female    DOB: 07-10-1933, 78 y.o.   MRN: [id number]  HPI The PT is here for follow up and re-evaluation of chronic medical conditions, medication management and review of any available recent lab and radiology data. She specifically is here for review following acute bronchitis. States cough has improved greatly, no sputum, fever, chills or wheezing. C/o numbness and pain in feet, primarily following chemotherapy, disturbs her sleep at times. Reports increased sugar and food intake with weight gain and uncontrolled blood sugars, at times fasting sugar is as high as 130, plans to change thia       Review of Systems See HPI Denies recent fever or chills. Denies sinus pressure, nasal congestion, ear pain or sore throat. Denies chest congestion, productive cough or wheezing. Denies chest pains, palpitations and leg swelling Denies abdominal pain, nausea, vomiting,diarrhea or constipation.   Denies dysuria, frequency, hesitancy or incontinence. Denies joint pain, swelling and limitation in mobility. Denies headaches or seizures,  Denies depression, anxiety or insomnia. Denies skin break down or rash.        Objective:   Physical Exam  Patient alert and oriented and in no cardiopulmonary distress.  HEENT: No facial asymmetry, EOMI, no sinus tenderness,  oropharynx pink and moist.  Neck supple no adenopathy.  Chest: Clear to auscultation bilaterally.  CVS: S1, S2 no murmurs, no S3.  ABD: Soft non tender. Bowel sounds normal.  Ext: No edema  MS: Adequate ROM spine, shoulders, hips and knees.  Skin: Intact, no ulcerations or rash noted.  Psych: Good eye contact, normal affect. Memory intact not anxious or depressed appearing.  CNS: CN 2-12 intact, power, tone and sensation normal throughout.       Assessment & Plan:

## 2011-02-19 NOTE — Assessment & Plan Note (Signed)
Currently controlled, but increased symptoms expected with the Spring

## 2011-02-19 NOTE — Assessment & Plan Note (Signed)
Pt symptomatic, will try gabapentin

## 2011-02-19 NOTE — Assessment & Plan Note (Signed)
On no medication currently, s/p bilateral mastectomy, has appt at [facility] in the next month

## 2011-02-19 NOTE — Assessment & Plan Note (Signed)
Deteriorated. Patient re-educated about  the importance of commitment to a  minimum of 150 minutes of exercise per week. The importance of healthy food choices with portion control discussed. Encouraged to start a food diary, count calories and to consider  joining a support group. Sample diet sheets offered. Goals set by the patient for the next several months.    

## 2011-02-19 NOTE — Assessment & Plan Note (Signed)
Asymptomatic , will try to taper off omeprazole

## 2011-02-19 NOTE — Patient Instructions (Signed)
F/u as before.  Your lungs are clear.  I have stopped some of your medication as discussed.  You are to start gabapentin at bedtime, as needed for foot pain. OK to increase the dose per the directions  Pls remove sweets, cake and unhealthy food from your home so that you do not gain any more weight and lose weight  Cut back on omeprazole to 3 times per week, then twice per week then stop , see if you can do without this medication also  It is important that you exercise regularly at least 30 minutes 5 times a week. If you develop chest pain, have severe difficulty breathing, or feel very tired, stop exercising immediately and seek medical attention  A healthy diet is rich in fruit, vegetables and whole grains. Poultry fish, nuts and beans are a healthy choice for protein rather then red meat. A low sodium diet and drinking 64 ounces of water daily is generally recommended. Oils and sweet should be limited. Carbohydrates especially for those who are diabetic or overweight, should be limited to 34-45 gram per meal. It is important to eat on a regular schedule, at least 3 times daily. Snacks should be primarily fruits, vegetables or nuts.

## 2011-02-26 ENCOUNTER — Other Ambulatory Visit: Payer: Self-pay | Admitting: Family Medicine

## 2011-03-08 DIAGNOSIS — E785 Hyperlipidemia, unspecified: Secondary | ICD-10-CM | POA: Diagnosis not present

## 2011-03-08 DIAGNOSIS — I10 Essential (primary) hypertension: Secondary | ICD-10-CM | POA: Diagnosis not present

## 2011-03-09 LAB — LIPID PANEL
Cholesterol: 216 mg/dL — ABNORMAL HIGH (ref 0–200)
HDL: 53 mg/dL (ref >39)
LDL Cholesterol: 140 mg/dL — ABNORMAL HIGH (ref 0–99)
Total CHOL/HDL Ratio: 4.1 Ratio
Triglycerides: 115 mg/dL (ref <150)
VLDL: 23 mg/dL (ref 0–40)

## 2011-03-09 LAB — HEPATIC FUNCTION PANEL
ALT: 43 U/L — ABNORMAL HIGH (ref 0–35)
AST: 43 U/L — ABNORMAL HIGH (ref 0–37)
Albumin: 4.1 g/dL (ref 3.5–5.2)
Alkaline Phosphatase: 51 U/L (ref 39–117)
Bilirubin, Direct: 0.1 mg/dL (ref 0.0–0.3)
Indirect Bilirubin: 0.3 mg/dL (ref 0.0–0.9)
Total Bilirubin: 0.4 mg/dL (ref 0.3–1.2)
Total Protein: 6.8 g/dL (ref 6.0–8.3)

## 2011-03-09 LAB — BASIC METABOLIC PANEL
BUN: 12 mg/dL (ref 6–23)
CO2: 28 mEq/L (ref 19–32)
Calcium: 9.6 mg/dL (ref 8.4–10.5)
Chloride: 101 mEq/L (ref 96–112)
Creat: 0.63 mg/dL (ref 0.50–1.10)
Glucose, Bld: 107 mg/dL — ABNORMAL HIGH (ref 70–99)
Potassium: 4.4 mEq/L (ref 3.5–5.3)
Sodium: 140 mEq/L (ref 135–145)

## 2011-03-14 ENCOUNTER — Encounter: Payer: Self-pay | Admitting: Family Medicine

## 2011-03-14 ENCOUNTER — Ambulatory Visit: Payer: Medicare Other | Admitting: Family Medicine

## 2011-03-14 ENCOUNTER — Ambulatory Visit (INDEPENDENT_AMBULATORY_CARE_PROVIDER_SITE_OTHER): Payer: Medicare Other | Admitting: Family Medicine

## 2011-03-14 VITALS — BP 138/78 | HR 89 | Resp 16 | Ht 63.5 in | Wt 196.0 lb

## 2011-03-14 DIAGNOSIS — E119 Type 2 diabetes mellitus without complications: Secondary | ICD-10-CM

## 2011-03-14 DIAGNOSIS — E669 Obesity, unspecified: Secondary | ICD-10-CM

## 2011-03-14 DIAGNOSIS — I10 Essential (primary) hypertension: Secondary | ICD-10-CM

## 2011-03-14 DIAGNOSIS — L0390 Cellulitis, unspecified: Secondary | ICD-10-CM | POA: Insufficient documentation

## 2011-03-14 DIAGNOSIS — L0291 Cutaneous abscess, unspecified: Secondary | ICD-10-CM

## 2011-03-14 MED ORDER — FLUTICASONE PROPIONATE 50 MCG/ACT NA SUSP: 2.0000 | Freq: Every day | NASAL | Status: DC

## 2011-03-14 MED ORDER — PREDNISONE 5 MG PO TABS: ORAL_TABLET | ORAL | Status: AC

## 2011-03-14 MED ORDER — CEPHALEXIN 500 MG PO CAPS: ORAL_CAPSULE | ORAL | Status: AC

## 2011-03-14 NOTE — Assessment & Plan Note (Signed)
Controlled, no change in medication  

## 2011-03-14 NOTE — Patient Instructions (Signed)
F/u in 4 weeks.  You are being treated for cellulitis of the legs.  If the condition does not improve and get better please keep appt with the dermatologist that is being scheduled  HBA1c in 4 weeks if you have not had one since March   We will contact you about any necessary labs

## 2011-03-21 DIAGNOSIS — E119 Type 2 diabetes mellitus without complications: Secondary | ICD-10-CM | POA: Diagnosis not present

## 2011-03-21 DIAGNOSIS — E1149 Type 2 diabetes mellitus with other diabetic neurological complication: Secondary | ICD-10-CM | POA: Diagnosis not present

## 2011-03-24 NOTE — Progress Notes (Signed)
  Subjective:    Patient ID: Lisa Mann, female    DOB: 1933/08/19, 78 y.o.   MRN: [id number]  HPI The PT is here for follow up and re-evaluation of chronic medical conditions, medication management and review of any available recent lab and radiology data.  Preventive health is updated, specifically  Cancer screening and Immunization.   Questions or concerns regarding consultations or procedures which the PT has had in the interim are  addressed. The PT denies any adverse reactions to current medications since the last visit.  C/o pruritic red rash on both legs which developed over the past 5 days and is spreading, no new toiletries, no h/o fever , chills or drainage from the area. Has not had this problem before . Blood sugars in the morning are seldom over 110, and she is working on improved eating habits to promote weight loss and improved health       Review of Systems See HPI Denies recent fever or chills. Denies sinus pressure, nasal congestion, ear pain or sore throat. Denies chest congestion, productive cough or wheezing. Denies chest pains, palpitations and leg swelling Denies abdominal pain, nausea, vomiting,diarrhea or constipation.   Denies dysuria, frequency, hesitancy or incontinence. Denies joint pain, swelling and limitation in mobility. Denies headaches, seizures, numbness, or tingling. Denies depression, anxiety or insomnia.        Objective:   Physical Exam Patient alert and oriented and in no cardiopulmonary distress.  HEENT: No facial asymmetry, EOMI, no sinus tenderness,  oropharynx pink and moist.  Neck supple no adenopathy.  Chest: Clear to auscultation bilaterally.  CVS: S1, S2 no murmurs, no S3.  ABD: Soft non tender. Bowel sounds normal.  Ext: No edema  MS: Adequate ROM spine, shoulders, hips and knees.  Skin: Intact, erythematous macular rash on both legs from ankle to upper third anterior surface  Psych: Good eye contact, normal affect.  Memory intact not anxious or depressed appearing.  CNS: CN 2-12 intact, power, tone and sensation normal throughout.        Assessment & Plan:

## 2011-03-24 NOTE — Assessment & Plan Note (Signed)
Unchanged. Patient re-educated about  the importance of commitment to a  minimum of 150 minutes of exercise per week. The importance of healthy food choices with portion control discussed. Encouraged to start a food diary, count calories and to consider  joining a support group. Sample diet sheets offered. Goals set by the patient for the next several months.    

## 2011-03-24 NOTE — Assessment & Plan Note (Signed)
Antibiotics and steroids prescribed, pt referred to derm in the event that this does not address the problem in a timely fashion

## 2011-03-24 NOTE — Assessment & Plan Note (Signed)
Controlled, no change in medication  

## 2011-04-05 DIAGNOSIS — E119 Type 2 diabetes mellitus without complications: Secondary | ICD-10-CM | POA: Diagnosis not present

## 2011-04-05 LAB — HEMOGLOBIN A1C
Hgb A1c MFr Bld: 6.7 % — ABNORMAL HIGH (ref <5.7)
Mean Plasma Glucose: 146 mg/dL — ABNORMAL HIGH (ref <117)

## 2011-04-07 ENCOUNTER — Other Ambulatory Visit: Payer: Self-pay | Admitting: Family Medicine

## 2011-04-11 ENCOUNTER — Ambulatory Visit (INDEPENDENT_AMBULATORY_CARE_PROVIDER_SITE_OTHER): Payer: Medicare Other | Admitting: Family Medicine

## 2011-04-11 ENCOUNTER — Encounter: Payer: Self-pay | Admitting: Family Medicine

## 2011-04-11 VITALS — BP 140/80 | HR 84 | Resp 18 | Ht 63.5 in | Wt 197.0 lb

## 2011-04-11 DIAGNOSIS — I10 Essential (primary) hypertension: Secondary | ICD-10-CM | POA: Diagnosis not present

## 2011-04-11 DIAGNOSIS — E785 Hyperlipidemia, unspecified: Secondary | ICD-10-CM

## 2011-04-11 DIAGNOSIS — J309 Allergic rhinitis, unspecified: Secondary | ICD-10-CM | POA: Diagnosis not present

## 2011-04-11 DIAGNOSIS — E119 Type 2 diabetes mellitus without complications: Secondary | ICD-10-CM | POA: Diagnosis not present

## 2011-04-11 DIAGNOSIS — E669 Obesity, unspecified: Secondary | ICD-10-CM | POA: Diagnosis not present

## 2011-04-11 MED ORDER — TRIAMTERENE-HCTZ 75-50 MG PO TABS: ORAL_TABLET | ORAL | Status: DC

## 2011-04-11 MED ORDER — RAMIPRIL 5 MG PO CAPS: 5.0000 mg | ORAL_CAPSULE | Freq: Every day | ORAL | Status: DC

## 2011-04-11 MED ORDER — AMLODIPINE BESYLATE 5 MG PO TABS: 5.0000 mg | ORAL_TABLET | Freq: Every day | ORAL | Status: DC

## 2011-04-11 NOTE — Patient Instructions (Signed)
F/u in 2 month  New additional medication for blood pressure , which also helps to protect your kidneys, ramipril 5mg  one daily.If you develop dry hacking cough on this call to stop the medication please It is important that you exercise regularly at least 30 minutes 5 times a week. If you develop chest pain, have severe difficulty breathing, or feel very tired, stop exercising immediately and seek medical attention   A healthy diet is rich in fruit, vegetables and whole grains. Poultry fish, nuts and beans are a healthy choice for protein rather then red meat. A low sodium diet and drinking 64 ounces of water daily is generally recommended. Oils and sweet should be limited. Carbohydrates especially for those who are diabetic or overweight, should be limited to 04-45 gram per meal. It is important to eat on a regular schedule, at least 3 times daily. Snacks should be primarily fruits, vegetables or nuts.  microalb today.  You will be contacted about your colonoscopy.

## 2011-04-11 NOTE — Progress Notes (Signed)
  Subjective:    Patient ID: Lisa Mann, female    DOB: 1933-05-15, 78 y.o.   MRN: [id number]  HPI The PT is here for follow up and re-evaluation of chronic medical conditions, medication management and review of any available recent lab and radiology data.  Preventive health is updated, specifically  Cancer screening and Immunization.   Questions or concerns regarding consultations or procedures which the PT has had in the interim are  addressed. The PT denies any adverse reactions to current medications since the last visit.  There are no new concerns. Reports excellent response of rash to medication she was recently put on There are no specific complaints. Fasting blood sugars , when checked are seldom over 120       Review of Systems See HPI Denies recent fever or chills. Denies sinus pressure, nasal congestion, ear pain or sore throat. Denies chest congestion, productive cough or wheezing. Denies chest pains, palpitations and leg swelling Denies abdominal pain, nausea, vomiting,diarrhea or constipation.   Denies dysuria, frequency, hesitancy or incontinence. Denies joint pain, swelling and limitation in mobility. Denies headaches, seizures, numbness, or tingling. Denies depression, anxiety or insomnia. Denies skin break down or rash.        Objective:   Physical Exam Patient alert and oriented and in no cardiopulmonary distress.  HEENT: No facial asymmetry, EOMI, no sinus tenderness,  oropharynx pink and moist.  Neck supple no adenopathy.  Chest: Clear to auscultation bilaterally.  CVS: S1, S2 no murmurs, no S3.  ABD: Soft non tender. Bowel sounds normal.  Ext: No edema  MS: Adequate ROM spine, shoulders, hips and knees.  Skin: Intact, no ulcerations or rash noted.  Psych: Good eye contact, normal affect. Memory intact not anxious or depressed appearing.  CNS: CN 2-12 intact, power, tone and sensation normal throughout.  Diabetic Foot Check:  Appearance  - no lesions, ulcers or calluses Skin - no unusual pallor or redness Sensation - grossly intact to light touch Monofilament testing -  Right - Great toe, medial, central, lateral ball and posterior foot diminished Left - Great toe, medial, central, lateral ball and posterior foot diminished Pulses Left - Dorsalis Pedis and Posterior Tibia normal Right - Dorsalis Pedis and Posterior Tibia normal        Assessment & Plan:

## 2011-04-12 LAB — MICROALBUMIN / CREATININE URINE RATIO
Creatinine, Urine: 101.8 mg/dL
Microalb Creat Ratio: 6.5 mg/g (ref 0.0–30.0)
Microalb, Ur: 0.66 mg/dL (ref 0.00–1.89)

## 2011-04-13 NOTE — Assessment & Plan Note (Signed)
Currently controlled on medication

## 2011-04-13 NOTE — Assessment & Plan Note (Signed)
Improved, pt applauded, due to nASH intolerant of medication management

## 2011-04-13 NOTE — Assessment & Plan Note (Signed)
Uncontrolled, increase dose of medication

## 2011-04-13 NOTE — Assessment & Plan Note (Signed)
Controlled, no change in medication  

## 2011-04-13 NOTE — Assessment & Plan Note (Signed)
Unchanged. Patient re-educated about  the importance of commitment to a  minimum of 150 minutes of exercise per week. The importance of healthy food choices with portion control discussed. Encouraged to start a food diary, count calories and to consider  joining a support group. Sample diet sheets offered. Goals set by the patient for the next several months.    

## 2011-04-15 DIAGNOSIS — E119 Type 2 diabetes mellitus without complications: Secondary | ICD-10-CM | POA: Diagnosis not present

## 2011-04-15 DIAGNOSIS — H18519 Endothelial corneal dystrophy, unspecified eye: Secondary | ICD-10-CM | POA: Diagnosis not present

## 2011-04-15 DIAGNOSIS — H1045 Other chronic allergic conjunctivitis: Secondary | ICD-10-CM | POA: Diagnosis not present

## 2011-04-15 DIAGNOSIS — H2510 Age-related nuclear cataract, unspecified eye: Secondary | ICD-10-CM | POA: Diagnosis not present

## 2011-04-25 ENCOUNTER — Ambulatory Visit (INDEPENDENT_AMBULATORY_CARE_PROVIDER_SITE_OTHER): Payer: Medicare Other | Admitting: Family Medicine

## 2011-04-25 ENCOUNTER — Encounter: Payer: Self-pay | Admitting: Family Medicine

## 2011-04-25 VITALS — BP 126/68 | HR 91 | Resp 18 | Ht 63.5 in | Wt 197.0 lb

## 2011-04-25 DIAGNOSIS — L0291 Cutaneous abscess, unspecified: Secondary | ICD-10-CM | POA: Diagnosis not present

## 2011-04-25 DIAGNOSIS — L0390 Cellulitis, unspecified: Secondary | ICD-10-CM | POA: Diagnosis not present

## 2011-04-25 DIAGNOSIS — I10 Essential (primary) hypertension: Secondary | ICD-10-CM | POA: Diagnosis not present

## 2011-04-25 MED ORDER — DOXYCYCLINE HYCLATE 100 MG PO TABS: 100.0000 mg | ORAL_TABLET | Freq: Two times a day (BID) | ORAL | Status: AC

## 2011-04-25 MED ORDER — CEFTRIAXONE SODIUM 1 G IJ SOLR
500.0000 mg | Freq: Once | INTRAMUSCULAR | Status: AC
  Administered 2011-04-25: 500 mg via INTRAMUSCULAR

## 2011-04-25 NOTE — Patient Instructions (Signed)
F/u as before.  Please call if you need me before  You are being treated for skin infection between the breasts.  You will get rocephin 500mg  IM in the office, and antibiotic doxycycline is sent to your pharmacy for 10 days.  Please go to the emergency room if the  Rash worsens , it should improve in the next 2 to 3 days

## 2011-04-25 NOTE — Progress Notes (Signed)
  Subjective:    Patient ID: Lisa Mann, female    DOB: 21-Oct-1933, 78 y.o.   MRN: [id number]  HPI  2 day h/o skin infection which is draining between breasts. No fever or chills noted, No recent insect bites to patient's knowledge. Otherwise no new complaints or concerns nd had been well   Review of Systems See HPI Denies recent fever or chills. Denies sinus pressure, nasal congestion, ear pain or sore throat. Denies chest congestion, productive cough or wheezing. Denies depression, anxiety or insomnia. Denies polyuria, polydipsia or blurred vision       Objective:   Physical Exam Patient alert and oriented and in no cardiopulmonary distress.  HEENT: No facial asymmetry, EOMI, no sinus tenderness,  oropharynx pink and moist.  Neck supple no adenopathy.  Chest: Clear to auscultation bilaterally.  CVS: S1, S2 no murmurs, no S3.  ABD: Soft non tender. Bowel sounds normal.  Ext: No edema  Skin: Erythematous macular  rash noted on anterior chest between breasts, with draining boil   CNS: CN 2-12 intact, power,  normal throughout.        Assessment & Plan:

## 2011-04-27 NOTE — Assessment & Plan Note (Signed)
Controlled, no change in medication  

## 2011-04-27 NOTE — Assessment & Plan Note (Signed)
Cellulitis and abces on chest between breasts, rocephin in office and antibiotics prescribed

## 2011-05-11 ENCOUNTER — Other Ambulatory Visit: Payer: Self-pay | Admitting: Family Medicine

## 2011-05-13 ENCOUNTER — Other Ambulatory Visit: Payer: Self-pay | Admitting: Family Medicine

## 2011-05-29 DIAGNOSIS — C50919 Malignant neoplasm of unspecified site of unspecified female breast: Secondary | ICD-10-CM | POA: Diagnosis not present

## 2011-05-30 DIAGNOSIS — E119 Type 2 diabetes mellitus without complications: Secondary | ICD-10-CM | POA: Diagnosis not present

## 2011-05-30 DIAGNOSIS — E1149 Type 2 diabetes mellitus with other diabetic neurological complication: Secondary | ICD-10-CM | POA: Diagnosis not present

## 2011-06-12 ENCOUNTER — Ambulatory Visit: Payer: Medicare Other | Admitting: Family Medicine

## 2011-06-26 ENCOUNTER — Ambulatory Visit (INDEPENDENT_AMBULATORY_CARE_PROVIDER_SITE_OTHER): Payer: Medicare Other | Admitting: Family Medicine

## 2011-06-26 ENCOUNTER — Encounter: Payer: Self-pay | Admitting: Family Medicine

## 2011-06-26 VITALS — BP 134/78 | HR 75 | Resp 15 | Ht 63.5 in | Wt 199.4 lb

## 2011-06-26 DIAGNOSIS — I10 Essential (primary) hypertension: Secondary | ICD-10-CM

## 2011-06-26 DIAGNOSIS — E669 Obesity, unspecified: Secondary | ICD-10-CM

## 2011-06-26 DIAGNOSIS — E119 Type 2 diabetes mellitus without complications: Secondary | ICD-10-CM | POA: Diagnosis not present

## 2011-06-26 DIAGNOSIS — R1310 Dysphagia, unspecified: Secondary | ICD-10-CM | POA: Insufficient documentation

## 2011-06-26 DIAGNOSIS — E785 Hyperlipidemia, unspecified: Secondary | ICD-10-CM

## 2011-06-26 MED ORDER — METFORMIN HCL 1000 MG PO TABS: ORAL_TABLET | ORAL | Status: DC

## 2011-06-26 MED ORDER — POTASSIUM CHLORIDE CRYS ER 20 MEQ PO TBCR: EXTENDED_RELEASE_TABLET | ORAL | Status: DC

## 2011-06-26 MED ORDER — ALLOPURINOL 300 MG PO TABS: ORAL_TABLET | ORAL | Status: DC

## 2011-06-26 MED ORDER — TRIAMTERENE-HCTZ 75-50 MG PO TABS: ORAL_TABLET | ORAL | Status: DC

## 2011-06-26 MED ORDER — AMLODIPINE BESYLATE 5 MG PO TABS: 5.0000 mg | ORAL_TABLET | Freq: Every day | ORAL | Status: DC

## 2011-06-26 NOTE — Assessment & Plan Note (Signed)
Uncontrolled, Hyperlipidemia:Low fat diet discussed and encouraged.   Updated labs at next visit, has fatty liver statin intolerant

## 2011-06-26 NOTE — Patient Instructions (Addendum)
F/u as before.  You are being referred to dr fields for evaluation of difficulty swallowing  It is important that you exercise regularly at least 30 minutes 5 times a week. If you develop chest pain, have severe difficulty breathing, or feel very tired, stop exercising immediately and seek medical attention    A healthy diet is rich in fruit, vegetables and whole grains. Poultry fish, nuts and beans are a healthy choice for protein rather then red meat. A low sodium diet and drinking 64 ounces of water daily is generally recommended. Oils and sweet should be limited. Carbohydrates especially for those who are diabetic or overweight, should be limited to 30-45 gram per meal. It is important to eat on a regular schedule, at least 3 times daily. Snacks should be primarily fruits, vegetables or nuts.  No changes in medication

## 2011-06-26 NOTE — Assessment & Plan Note (Signed)
Controlled, no change in medication  

## 2011-06-26 NOTE — Progress Notes (Signed)
  Subjective:    Patient ID: Lisa Mann, female    DOB: October 11, 1933, 78 y.o.   MRN: [id number]  HPI The PT is here for follow up and re-evaluation of chronic medical conditions, medication management and review of any available recent lab and radiology data.  Preventive health is updated, specifically  Cancer screening and Immunization.   Questions or concerns regarding consultations or procedures which the PT has had in the interim are  Addressed.Recently seen at the cancer clinic in [place] re bilateral breast cancer history, she is on no medication and is 5 years since initial presentation The PT denies any adverse reactions to current medications since the last visit.  6 week h/o increasing dysphagia for solids as well as liquids, gets choked at times    Review of Systems See HPI Denies recent fever or chills. Denies sinus pressure, nasal congestion, ear pain or sore throat. Denies chest congestion, productive cough or wheezing. Denies chest pains, palpitations and leg swelling Denies abdominal pain, nausea, vomiting,diarrhea or constipation. Takes omeprazole daily  Denies dysuria, frequency, hesitancy or incontinence. Denies joint pain, swelling and limitation in mobility. Denies headaches, seizures, numbness, or tingling. Denies depression, anxiety or insomnia. Denies skin break down or rash.       Objective:   Physical Exam  Patient alert and oriented and in no cardiopulmonary distress.  HEENT: No facial asymmetry, EOMI, no sinus tenderness,  oropharynx pink and moist.  Neck supple no adenopathy.  Chest: Clear to auscultation bilaterally.  CVS: S1, S2 no murmurs, no S3.  ABD: Soft non tender. Bowel sounds normal.  Ext: No edema  MS: Adequate ROM spine, shoulders, hips and knees.  Skin: Intact, no ulcerations or rash noted.  Psych: Good eye contact, normal affect. Memory intact not anxious or depressed appearing.  CNS: CN 2-12 intact, power, tone and sensation  normal throughout.       Assessment & Plan:

## 2011-06-26 NOTE — Assessment & Plan Note (Signed)
Unchanged. Patient re-educated about  the importance of commitment to a  minimum of 150 minutes of exercise per week. The importance of healthy food choices with portion control discussed. Encouraged to start a food diary, count calories and to consider  joining a support group. Sample diet sheets offered. Goals set by the patient for the next several months.    

## 2011-06-26 NOTE — Assessment & Plan Note (Signed)
6 month h/o progressive solid and liquid dysphagia, needs GI eval

## 2011-07-10 ENCOUNTER — Other Ambulatory Visit: Payer: Self-pay | Admitting: Family Medicine

## 2011-07-24 ENCOUNTER — Telehealth: Payer: Self-pay

## 2011-07-24 ENCOUNTER — Encounter: Payer: Self-pay | Admitting: Internal Medicine

## 2011-07-24 DIAGNOSIS — M109 Gout, unspecified: Secondary | ICD-10-CM | POA: Diagnosis not present

## 2011-07-24 DIAGNOSIS — R5381 Other malaise: Secondary | ICD-10-CM

## 2011-07-24 DIAGNOSIS — I10 Essential (primary) hypertension: Secondary | ICD-10-CM | POA: Diagnosis not present

## 2011-07-24 DIAGNOSIS — E785 Hyperlipidemia, unspecified: Secondary | ICD-10-CM

## 2011-07-24 DIAGNOSIS — E119 Type 2 diabetes mellitus without complications: Secondary | ICD-10-CM | POA: Diagnosis not present

## 2011-07-24 NOTE — Telephone Encounter (Signed)
Needs HBA1c, chem 7, uric acid and tsh non fasting

## 2011-07-24 NOTE — Telephone Encounter (Signed)
Does she need any labwork before she comes for her office visit?

## 2011-07-24 NOTE — Telephone Encounter (Signed)
Patient aware and will have labs drawn.  

## 2011-07-25 ENCOUNTER — Telehealth: Payer: Self-pay | Admitting: *Deleted

## 2011-07-25 ENCOUNTER — Ambulatory Visit (INDEPENDENT_AMBULATORY_CARE_PROVIDER_SITE_OTHER): Payer: Medicare Other | Admitting: Gastroenterology

## 2011-07-25 ENCOUNTER — Encounter: Payer: Self-pay | Admitting: Gastroenterology

## 2011-07-25 VITALS — BP 121/65 | HR 74 | Temp 97.1°F | Ht 63.0 in | Wt 199.2 lb

## 2011-07-25 DIAGNOSIS — M109 Gout, unspecified: Secondary | ICD-10-CM | POA: Diagnosis not present

## 2011-07-25 DIAGNOSIS — R1310 Dysphagia, unspecified: Secondary | ICD-10-CM

## 2011-07-25 DIAGNOSIS — R5381 Other malaise: Secondary | ICD-10-CM | POA: Diagnosis not present

## 2011-07-25 DIAGNOSIS — K219 Gastro-esophageal reflux disease without esophagitis: Secondary | ICD-10-CM

## 2011-07-25 DIAGNOSIS — I10 Essential (primary) hypertension: Secondary | ICD-10-CM | POA: Diagnosis not present

## 2011-07-25 DIAGNOSIS — E119 Type 2 diabetes mellitus without complications: Secondary | ICD-10-CM | POA: Diagnosis not present

## 2011-07-25 LAB — BASIC METABOLIC PANEL
BUN: 11 mg/dL (ref 6–23)
CO2: 31 mEq/L (ref 19–32)
Calcium: 9.7 mg/dL (ref 8.4–10.5)
Chloride: 97 mEq/L (ref 96–112)
Creat: 0.63 mg/dL (ref 0.50–1.10)
Glucose, Bld: 122 mg/dL — ABNORMAL HIGH (ref 70–99)
Potassium: 4.6 mEq/L (ref 3.5–5.3)
Sodium: 137 mEq/L (ref 135–145)

## 2011-07-25 LAB — HEMOGLOBIN A1C
Hgb A1c MFr Bld: 6.6 % — ABNORMAL HIGH (ref <5.7)
Mean Plasma Glucose: 143 mg/dL — ABNORMAL HIGH (ref <117)

## 2011-07-25 LAB — TSH: TSH: 0.5 u[IU]/mL (ref 0.350–4.500)

## 2011-07-25 LAB — URIC ACID: Uric Acid, Serum: 3.8 mg/dL (ref 2.4–7.0)

## 2011-07-25 NOTE — Patient Instructions (Signed)
TAKE PRILOSEC 30 MINUTES PRIOR TO YOUR FIRST OR LAST MEAL.  FOLLOW A LOW FAT/DIABETIC DIET. SEE INFO BELOW.  LOSE 10 TO 20 LBS.  FOLLOW UP IN 4 MOS.  Low-Fat Diet BREADS, CEREALS, PASTA, RICE, DRIED PEAS, AND BEANS These products are high in carbohydrates and most are low in fat. Therefore, they can be increased in the diet as substitutes for fatty foods. They too, however, contain calories and should not be eaten in excess. Cereals can be eaten for snacks as well as for breakfast.  Include foods that contain fiber (fruits, vegetables, whole grains, and legumes). Research shows that fiber may lower blood cholesterol levels, especially the water-soluble fiber found in fruits, vegetables, oat products, and legumes. FRUITS AND VEGETABLES It is good to eat fruits and vegetables. Besides being sources of fiber, both are rich in vitamins and some minerals. They help you get the daily allowances of these nutrients. Fruits and vegetables can be used for snacks and desserts. MEATS Limit lean meat, chicken, [place], and fish to no more than 6 ounces per day. Beef, Pork, and Lamb Use lean cuts of beef, pork, and lamb. Lean cuts include:  Extra-lean ground beef.  Arm roast.  Sirloin tip.  Center-cut ham.  Round steak.  Loin chops.  Rump roast.  Tenderloin.  Trim all fat off the outside of meats before cooking. It is not necessary to severely decrease the intake of red meat, but lean choices should be made. Lean meat is rich in protein and contains a highly absorbable form of iron. Premenopausal women, in particular, should avoid reducing lean red meat because this could increase the risk for low red blood cells (iron-deficiency anemia).  Chicken and [place] These are good sources of protein. The fat of poultry can be reduced by removing the skin and underlying fat layers before cooking. Chicken and [place] can be substituted for lean red meat in the diet. Poultry should not be fried or covered with  high-fat sauces. Fish and Shellfish Fish is a good source of protein. Shellfish contain cholesterol, but they usually are low in saturated fatty acids. The preparation of fish is important. Like chicken and [place], they should not be fried or covered with high-fat sauces. EGGS Egg whites contain no fat or cholesterol. They can be eaten often. Try 1 to 2 egg whites instead of whole eggs in recipes or use egg substitutes that do not contain yolk.  MILK AND DAIRY PRODUCTS Use skim or 1% milk instead of 2% or whole milk. Decrease whole milk, natural, and processed cheeses. Use nonfat or low-fat (2%) cottage cheese or low-fat cheeses made from vegetable oils. Choose nonfat or low-fat (1 to 2%) yogurt. Experiment with evaporated skim milk in recipes that call for heavy cream. Substitute low-fat yogurt or low-fat cottage cheese for sour cream in dips and salad dressings. Have at least 2 servings of low-fat dairy products, such as 2 glasses of skim (or 1%) milk each day to help get your daily calcium intake.  FATS AND OILS Butterfat, lard, and beef fats are high in saturated fat and cholesterol. These should be avoided.Vegetable fats do not contain cholesterol. AVOID coconut oil, palm oil, and palm kernel oil, WHICH are very high in saturated fats. These should be limited. These fats are often used in bakery goods, processed foods, popcorn, oils, and nondairy creamers. Vegetable shortenings and some peanut butters contain hydrogenated oils, which are also saturated fats. Read the labels on these foods and check for saturated vegetable  oils.  Desirable liquid vegetable oils are corn oil, cottonseed oil, olive oil, canola oil, safflower oil, soybean oil, and sunflower oil. Peanut oil is not as good, but small amounts are acceptable. Buy a heart-healthy tub margarine that has no partially hydrogenated oils in the ingredients. AVOID Mayonnaise and salad dressings often are made from unsaturated fats.  OTHER EATING  TIPS Snacks  Most sweets should be limited as snacks. They tend to be rich in calories and fats, and their caloric content outweighs their nutritional value. Some good choices in snacks are graham crackers, melba toast, soda crackers, bagels (no egg), English muffins, fruits, and vegetables. These snacks are preferable to snack crackers, [place] fries, and chips. Popcorn should be air-popped or cooked in small amounts of liquid vegetable oil.  Desserts Eat fruit, low-fat yogurt, and fruit ices instead of pastries, cake, and cookies. Sherbet, angel food cake, gelatin dessert, frozen low-fat yogurt, or other frozen products that do not contain saturated fat (pure fruit juice bars, frozen ice pops) are also acceptable.   COOKING METHODS Choose those methods that use little or no fat. They include: Poaching.  Braising.  Steaming.  Grilling.  Baking.  Stir-frying.  Broiling.  Microwaving.  Foods can be cooked in a nonstick pan without added fat, or use a nonfat cooking spray in regular cookware. Limit fried foods and avoid frying in saturated fat. Add moisture to lean meats by using water, broth, cooking wines, and other nonfat or low-fat sauces along with the cooking methods mentioned above. Soups and stews should be chilled after cooking. The fat that forms on top after a few hours in the refrigerator should be skimmed off. When preparing meals, avoid using excess salt. Salt can contribute to raising blood pressure in some people.  EATING AWAY FROM HOME Order entres, potatoes, and vegetables without sauces or butter. When meat exceeds the size of a deck of cards (3 to 4 ounces), the rest can be taken home for another meal. Choose vegetable or fruit salads and ask for low-calorie salad dressings to be served on the side. Use dressings sparingly. Limit high-fat toppings, such as bacon, crumbled eggs, cheese, sunflower seeds, and olives. Ask for heart-healthy tub margarine instead of butter.

## 2011-07-25 NOTE — Telephone Encounter (Signed)
Per Dr. Darrick Penna, pt took bottle out and showed her and then put the bottle back in her purse. Called pt and she said she will look through her purse again, will call if needed.
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
## 2011-07-25 NOTE — Progress Notes (Signed)
Faxed to PCP, Dr Lodema Hong

## 2011-07-25 NOTE — Assessment & Plan Note (Addendum)
TO SOLIDS AND LIQUIDS MOST LIKELY DUE TO UNCONTROLLED GERD WHILE PT WAS OFF HER MEDS. Sx IMPROVED AFTER RESTARTING PRILOSEC.   DISCUSSED MANAGEMENT OPTIONS WITH PT. SHE DECLINED BPE AT THIS TIME. WILL CALL IN ONE MONTH IF SX NOT IDEALLY CONTROLLED. EXPLAINED TO PT IF BPE SHOWS A STRICTURE OR RING, SHE WILL NEED AND UPPER ENDOSCOPY.  TAKE PRILOSEC 30 MINUTES PRIOR TO YOUR FIRST OR LAST MEAL.  FOLLOW A LOW FAT/DIABETIC DIET. SEE INFO BELOW.  LOSE 10 TO 20 LBS.   FOLLOW UP IN 4 MOS.  Low-Fat Diet BREADS, CEREALS, PASTA, RICE, DRIED PEAS, AND BEANS These products are high in carbohydrates and most are low in fat. Therefore, they can be increased in the diet as substitutes for fatty foods. They too, however, contain calories and should not be eaten in excess. Cereals can be eaten for snacks as well as for breakfast.  Include foods that contain fiber (fruits, vegetables, whole grains, and legumes). Research shows that fiber may lower blood cholesterol levels, especially the water-soluble fiber found in fruits, vegetables, oat products, and legumes. FRUITS AND VEGETABLES It is good to eat fruits and vegetables. Besides being sources of fiber, both are rich in vitamins and some minerals. They help you get the daily allowances of these nutrients. Fruits and vegetables can be used for snacks and desserts. MEATS Limit lean meat, chicken, [place], and fish to no more than 6 ounces per day. Beef, Pork, and Lamb Use lean cuts of beef, pork, and lamb. Lean cuts include:  Extra-lean ground beef.  Arm roast.  Sirloin tip.  Center-cut ham.  Round steak.  Loin chops.  Rump roast.  Tenderloin.  Trim all fat off the outside of meats before cooking. It is not necessary to severely decrease the intake of red meat, but lean choices should be made. Lean meat is rich in protein and contains a highly absorbable form of iron. Premenopausal women, in particular, should avoid reducing lean red meat because this could  increase the risk for low red blood cells (iron-deficiency anemia).  Chicken and [place] These are good sources of protein. The fat of poultry can be reduced by removing the skin and underlying fat layers before cooking. Chicken and [place] can be substituted for lean red meat in the diet. Poultry should not be fried or covered with high-fat sauces. Fish and Shellfish Fish is a good source of protein. Shellfish contain cholesterol, but they usually are low in saturated fatty acids. The preparation of fish is important. Like chicken and [place], they should not be fried or covered with high-fat sauces. EGGS Egg whites contain no fat or cholesterol. They can be eaten often. Try 1 to 2 egg whites instead of whole eggs in recipes or use egg substitutes that do not contain yolk.  MILK AND DAIRY PRODUCTS Use skim or 1% milk instead of 2% or whole milk. Decrease whole milk, natural, and processed cheeses. Use nonfat or low-fat (2%) cottage cheese or low-fat cheeses made from vegetable oils. Choose nonfat or low-fat (1 to 2%) yogurt. Experiment with evaporated skim milk in recipes that call for heavy cream. Substitute low-fat yogurt or low-fat cottage cheese for sour cream in dips and salad dressings. Have at least 2 servings of low-fat dairy products, such as 2 glasses of skim (or 1%) milk each day to help get your daily calcium intake.  FATS AND OILS Butterfat, lard, and beef fats are high in saturated fat and cholesterol. These should be avoided.Vegetable fats do not  contain cholesterol. AVOID coconut oil, palm oil, and palm kernel oil, WHICH are very high in saturated fats. These should be limited. These fats are often used in bakery goods, processed foods, popcorn, oils, and nondairy creamers. Vegetable shortenings and some peanut butters contain hydrogenated oils, which are also saturated fats. Read the labels on these foods and check for saturated vegetable oils.  Desirable liquid vegetable oils are corn  oil, cottonseed oil, olive oil, canola oil, safflower oil, soybean oil, and sunflower oil. Peanut oil is not as good, but small amounts are acceptable. Buy a heart-healthy tub margarine that has no partially hydrogenated oils in the ingredients. AVOID Mayonnaise and salad dressings often are made from unsaturated fats.  OTHER EATING TIPS Snacks  Most sweets should be limited as snacks. They tend to be rich in calories and fats, and their caloric content outweighs their nutritional value. Some good choices in snacks are graham crackers, melba toast, soda crackers, bagels (no egg), English muffins, fruits, and vegetables. These snacks are preferable to snack crackers, [place] fries, and chips. Popcorn should be air-popped or cooked in small amounts of liquid vegetable oil.  Desserts Eat fruit, low-fat yogurt, and fruit ices instead of pastries, cake, and cookies. Sherbet, angel food cake, gelatin dessert, frozen low-fat yogurt, or other frozen products that do not contain saturated fat (pure fruit juice bars, frozen ice pops) are also acceptable.   COOKING METHODS Choose those methods that use little or no fat. They include: Poaching.  Braising.  Steaming.  Grilling.  Baking.  Stir-frying.  Broiling.  Microwaving.  Foods can be cooked in a nonstick pan without added fat, or use a nonfat cooking spray in regular cookware. Limit fried foods and avoid frying in saturated fat. Add moisture to lean meats by using water, broth, cooking wines, and other nonfat or low-fat sauces along with the cooking methods mentioned above. Soups and stews should be chilled after cooking. The fat that forms on top after a few hours in the refrigerator should be skimmed off. When preparing meals, avoid using excess salt. Salt can contribute to raising blood pressure in some people.  EATING AWAY FROM HOME Order entres, potatoes, and vegetables without sauces or butter. When meat exceeds the size of a deck of cards (3 to  4 ounces), the rest can be taken home for another meal. Choose vegetable or fruit salads and ask for low-calorie salad dressings to be served on the side. Use dressings sparingly. Limit high-fat toppings, such as bacon, crumbled eggs, cheese, sunflower seeds, and olives. Ask for heart-healthy tub margarine instead of butter.

## 2011-07-25 NOTE — Telephone Encounter (Signed)
Lisa Mann called this am, she was seen in the office this am and believes that she left her samples of Prilosec in the exam room. Please return her call. Thanks.

## 2011-07-25 NOTE — Assessment & Plan Note (Signed)
  TAKE PRILOSEC 30 MINUTES PRIOR TO YOUR FIRST OR LAST MEAL.  FOLLOW A LOW FAT/DIABETIC DIET. SEE INFO BELOW.  LOSE 10 TO 20 LBS.  FOLLOW UP IN 4 MOS.

## 2011-07-31 ENCOUNTER — Encounter: Payer: Self-pay | Admitting: Family Medicine

## 2011-07-31 ENCOUNTER — Ambulatory Visit (INDEPENDENT_AMBULATORY_CARE_PROVIDER_SITE_OTHER): Payer: Medicare Other | Admitting: Family Medicine

## 2011-07-31 VITALS — BP 128/60 | HR 79 | Resp 18 | Ht 63.5 in | Wt 200.0 lb

## 2011-07-31 DIAGNOSIS — E119 Type 2 diabetes mellitus without complications: Secondary | ICD-10-CM

## 2011-07-31 DIAGNOSIS — Z0000 Encounter for general adult medical examination without abnormal findings: Secondary | ICD-10-CM

## 2011-07-31 DIAGNOSIS — E785 Hyperlipidemia, unspecified: Secondary | ICD-10-CM

## 2011-07-31 DIAGNOSIS — Z13820 Encounter for screening for osteoporosis: Secondary | ICD-10-CM

## 2011-07-31 MED ORDER — RISAQUAD PO CAPS: 1.0000 | ORAL_CAPSULE | Freq: Every day | ORAL | Status: DC

## 2011-07-31 MED ORDER — METOPROLOL SUCCINATE ER 25 MG PO TB24: 25.0000 mg | ORAL_TABLET | Freq: Every day | ORAL | Status: DC

## 2011-07-31 NOTE — Progress Notes (Signed)
Subjective:    Patient ID: Lisa Mann, female    DOB: [birth year], 78 y.o.   MRN: [id number]  HPI Preventive Screening-Counseling & Management   Patient present here today for a Medicare annual wellness visit.   Current Problems (verified)   Medications Prior to Visit Allergies (verified)   PAST HISTORY  Family History  Social History Widow x 2 years this December. Children, grandchildren and great grand children are very involved in her   Risk Factors  Current exercise habits:no regular    Dietary issues discussed:reduce fried foods , sweets and carbs   Cardiac risk factors: hypertension, diabetes and hyperlipidemia  Depression Screen  (Note: if answer to either of the following is "Yes", a more complete depression screening is indicated)   Over the past two weeks, have you felt down, depressed or hopeless? No  Over the past two weeks, have you felt little interest or pleasure in doing things? No  Have you lost interest or pleasure in daily life? No  Do you often feel hopeless? No  Do you cry easily over simple problems? No   Activities of Daily Living  In your present state of health, do you have any difficulty performing the following activities?  Driving?: No Managing money?: No Feeding yourself?:No Getting from bed to chair?:No Climbing a flight of stairs?:No Preparing food and eating?:No Bathing or showering?:No Getting dressed?:No Getting to the toilet?:No Using the toilet?:No Moving around from place to place?: No  Fall Risk Assessment In the past year have you fallen or had a near fall?:No Are you currently taking any medications that make you dizziness?:No   Hearing Difficulties: No Do you often ask people to speak up or repeat themselves?:No Do you experience ringing or noises in your ears?:No Do you have difficulty understanding soft or whispered voices?:No  Cognitive Testing  Alert? Yes Normal Appearance?Yes  Oriented to person? Yes  Place? Yes  Time? Yes  Displays appropriate judgment?Yes  Can read the correct time from a watch face? yes Are you having problems remembering things?little bit a  times approx 1 month  Advanced Directives have been discussed with the patient?Yes    List the Names of Other Physician/Practitioners you currently use: Dr. Clement Sayres any recent Medical Services you may have received from other than [facility] providers in the past year (date may be approximate). Dr. Noel Gerold opthalmologist, oncology at [facility] for bilateral breast cancer, Dr Pricilla Holm  Assessment:    Annual Wellness Exam   Plan:    During the course of the visit the patient was educated and counseled about appropriate screening and preventive services including:  A healthy diet is rich in fruit, vegetables and whole grains. Poultry fish, nuts and beans are a healthy choice for protein rather then red meat. A low sodium diet and drinking 64 ounces of water daily is generally recommended. Oils and sweet should be limited. Carbohydrates especially for those who are diabetic or overweight, should be limited to 30-45 gram per meal. It is important to eat on a regular schedule, at least 3 times daily. Snacks should be primarily fruits, vegetables or nuts. It is important that you exercise regularly at least 30 minutes 5 times a week. If you develop chest pain, have severe difficulty breathing, or feel very tired, stop exercising immediately and seek medical attention  Immunization reviewed and updated. Cancer screening reviewed and updated    Patient Instructions (the written plan) was given to the patient.  Medicare Attestation  I  have personally reviewed:  The patient's medical and social history  Their use of alcohol, tobacco or illicit drugs  Their current medications and supplements  The patient's functional ability including ADLs,fall risks, home safety risks, cognitive, and hearing and visual impairment  Diet and physical  activities  Evidence for depression or mood disorders  The patient's weight, height, BMI, and visual acuity have been recorded in the chart. I have made referrals, counseling, and provided education to the patient based on review of the above and I have provided the patient with a written personalized care plan for preventive services.      Review of Systems     Objective:   Physical Exam        Assessment & Plan:

## 2011-07-31 NOTE — Patient Instructions (Addendum)
F/u in 4 month, call if you need me before  Flu vaccines in October please call  Fasting lipid, cmp and EGFR  EGFR in 4 month Work on regular exercise, dietary chang and weight loss  Advanced directives need to be addresed You are being referred for a bone density scan

## 2011-08-04 ENCOUNTER — Encounter: Payer: Self-pay | Admitting: Family Medicine

## 2011-08-04 DIAGNOSIS — Z0000 Encounter for general adult medical examination without abnormal findings: Secondary | ICD-10-CM | POA: Insufficient documentation

## 2011-08-04 NOTE — Assessment & Plan Note (Signed)
Pt evaluated for annual wellness exam, the details are documented in the body of the note

## 2011-08-05 ENCOUNTER — Telehealth: Payer: Self-pay | Admitting: Family Medicine

## 2011-08-06 NOTE — Telephone Encounter (Signed)
patient is aware

## 2011-08-08 DIAGNOSIS — E1149 Type 2 diabetes mellitus with other diabetic neurological complication: Secondary | ICD-10-CM | POA: Diagnosis not present

## 2011-08-08 DIAGNOSIS — E119 Type 2 diabetes mellitus without complications: Secondary | ICD-10-CM | POA: Diagnosis not present

## 2011-08-09 ENCOUNTER — Ambulatory Visit (HOSPITAL_COMMUNITY)
Admission: RE | Admit: 2011-08-09 | Discharge: 2011-08-09 | Disposition: A | Discharge status: Home / Self Care | Payer: Medicare Other | Source: Ambulatory Visit | Attending: Family Medicine | Admitting: Family Medicine

## 2011-08-09 DIAGNOSIS — Z13820 Encounter for screening for osteoporosis: Secondary | ICD-10-CM

## 2011-08-09 DIAGNOSIS — Z780 Asymptomatic menopausal state: Secondary | ICD-10-CM | POA: Insufficient documentation

## 2011-08-12 DIAGNOSIS — H2000 Unspecified acute and subacute iridocyclitis: Secondary | ICD-10-CM | POA: Diagnosis not present

## 2011-08-12 DIAGNOSIS — H18519 Endothelial corneal dystrophy, unspecified eye: Secondary | ICD-10-CM | POA: Diagnosis not present

## 2011-08-12 DIAGNOSIS — H2510 Age-related nuclear cataract, unspecified eye: Secondary | ICD-10-CM | POA: Diagnosis not present

## 2011-08-14 DIAGNOSIS — H18519 Endothelial corneal dystrophy, unspecified eye: Secondary | ICD-10-CM | POA: Diagnosis not present

## 2011-08-14 DIAGNOSIS — H2000 Unspecified acute and subacute iridocyclitis: Secondary | ICD-10-CM | POA: Diagnosis not present

## 2011-08-14 DIAGNOSIS — H2510 Age-related nuclear cataract, unspecified eye: Secondary | ICD-10-CM | POA: Diagnosis not present

## 2011-08-16 NOTE — Progress Notes (Signed)
Reminder in epic to follow up in 4 months with SF in E30 

## 2011-08-27 ENCOUNTER — Telehealth: Payer: Self-pay

## 2011-08-27 DIAGNOSIS — D899 Disorder involving the immune mechanism, unspecified: Secondary | ICD-10-CM | POA: Diagnosis not present

## 2011-08-27 LAB — SEDIMENTATION RATE: Sed Rate: 1 mm/hr (ref 0–22)

## 2011-08-27 LAB — CBC WITH DIFFERENTIAL/PLATELET
Basophils Absolute: 0 10*3/uL (ref 0.0–0.1)
Basophils Relative: 1 % (ref 0–1)
Eosinophils Absolute: 0.1 10*3/uL (ref 0.0–0.7)
Eosinophils Relative: 3 % (ref 0–5)
HCT: 38.1 % (ref 36.0–46.0)
Hemoglobin: 12.8 g/dL (ref 12.0–15.0)
Lymphocytes Relative: 39 % (ref 12–46)
Lymphs Abs: 2.1 10*3/uL (ref 0.7–4.0)
MCH: 28.1 pg (ref 26.0–34.0)
MCHC: 33.6 g/dL (ref 30.0–36.0)
MCV: 83.7 fL (ref 78.0–100.0)
Monocytes Absolute: 0.5 10*3/uL (ref 0.1–1.0)
Monocytes Relative: 10 % (ref 3–12)
Neutro Abs: 2.5 10*3/uL (ref 1.7–7.7)
Neutrophils Relative %: 47 % (ref 43–77)
Platelets: 316 10*3/uL (ref 150–400)
RBC: 4.55 MIL/uL (ref 3.87–5.11)
RDW: 16 % — ABNORMAL HIGH (ref 11.5–15.5)
WBC: 5.3 10*3/uL (ref 4.0–10.5)

## 2011-08-27 LAB — RPR

## 2011-08-27 NOTE — Telephone Encounter (Signed)
Dr Patria Mane office wants lab tests ordered on patient to check for systemic immune disorders

## 2011-08-28 DIAGNOSIS — H01009 Unspecified blepharitis unspecified eye, unspecified eyelid: Secondary | ICD-10-CM | POA: Diagnosis not present

## 2011-08-28 DIAGNOSIS — H2510 Age-related nuclear cataract, unspecified eye: Secondary | ICD-10-CM | POA: Diagnosis not present

## 2011-08-28 DIAGNOSIS — H2000 Unspecified acute and subacute iridocyclitis: Secondary | ICD-10-CM | POA: Diagnosis not present

## 2011-08-28 DIAGNOSIS — H18219 Corneal edema secondary to contact lens, unspecified eye: Secondary | ICD-10-CM | POA: Diagnosis not present

## 2011-08-28 LAB — FLUORESCENT TREPONEMAL AB(FTA)-IGG-BLD: Fluorescent Treponemal Ab, IgG: NONREACTIVE

## 2011-08-28 LAB — ANA: Anti Nuclear Antibody(ANA): NEGATIVE

## 2011-08-28 LAB — ANGIOTENSIN CONVERTING ENZYME: Angiotensin-Converting Enzyme: <1 U/L — ABNORMAL LOW (ref 8–52)

## 2011-08-28 LAB — RHEUMATOID FACTOR: Rhuematoid fact SerPl-aCnc: 10 IU/mL (ref <=14)

## 2011-08-28 LAB — B. BURGDORFI ANTIBODIES: B burgdorferi Ab IgG+IgM: 0.4 {ISR}

## 2011-08-29 LAB — HLA-B27 ANTIGEN: DNA Result:: NOT DETECTED

## 2011-09-25 DIAGNOSIS — H18219 Corneal edema secondary to contact lens, unspecified eye: Secondary | ICD-10-CM | POA: Diagnosis not present

## 2011-09-25 DIAGNOSIS — H2510 Age-related nuclear cataract, unspecified eye: Secondary | ICD-10-CM | POA: Diagnosis not present

## 2011-09-25 DIAGNOSIS — H2000 Unspecified acute and subacute iridocyclitis: Secondary | ICD-10-CM | POA: Diagnosis not present

## 2011-09-25 DIAGNOSIS — H01009 Unspecified blepharitis unspecified eye, unspecified eyelid: Secondary | ICD-10-CM | POA: Diagnosis not present

## 2011-09-26 ENCOUNTER — Encounter: Payer: Self-pay | Admitting: *Deleted

## 2011-10-11 DIAGNOSIS — H5789 Other specified disorders of eye and adnexa: Secondary | ICD-10-CM | POA: Diagnosis not present

## 2011-10-11 DIAGNOSIS — H2010 Chronic iridocyclitis, unspecified eye: Secondary | ICD-10-CM | POA: Diagnosis not present

## 2011-10-12 ENCOUNTER — Other Ambulatory Visit: Payer: Self-pay | Admitting: Family Medicine

## 2011-10-16 DIAGNOSIS — H2510 Age-related nuclear cataract, unspecified eye: Secondary | ICD-10-CM | POA: Diagnosis not present

## 2011-10-16 DIAGNOSIS — E119 Type 2 diabetes mellitus without complications: Secondary | ICD-10-CM | POA: Diagnosis not present

## 2011-10-16 DIAGNOSIS — H2010 Chronic iridocyclitis, unspecified eye: Secondary | ICD-10-CM | POA: Diagnosis not present

## 2011-10-28 ENCOUNTER — Other Ambulatory Visit: Payer: Self-pay | Admitting: Family Medicine

## 2011-10-30 ENCOUNTER — Encounter: Payer: Self-pay | Admitting: Gastroenterology

## 2011-11-04 ENCOUNTER — Other Ambulatory Visit: Payer: Self-pay | Admitting: Family Medicine

## 2011-11-06 DIAGNOSIS — H2510 Age-related nuclear cataract, unspecified eye: Secondary | ICD-10-CM | POA: Diagnosis not present

## 2011-11-06 DIAGNOSIS — E119 Type 2 diabetes mellitus without complications: Secondary | ICD-10-CM | POA: Diagnosis not present

## 2011-11-06 DIAGNOSIS — H2010 Chronic iridocyclitis, unspecified eye: Secondary | ICD-10-CM | POA: Diagnosis not present

## 2011-11-06 DIAGNOSIS — H18519 Endothelial corneal dystrophy, unspecified eye: Secondary | ICD-10-CM | POA: Diagnosis not present

## 2011-11-22 DIAGNOSIS — E785 Hyperlipidemia, unspecified: Secondary | ICD-10-CM | POA: Diagnosis not present

## 2011-11-22 DIAGNOSIS — E119 Type 2 diabetes mellitus without complications: Secondary | ICD-10-CM | POA: Diagnosis not present

## 2011-11-22 LAB — LIPID PANEL
Cholesterol: 213 mg/dL — ABNORMAL HIGH (ref 0–200)
HDL: 48 mg/dL (ref >39)
LDL Cholesterol: 145 mg/dL — ABNORMAL HIGH (ref 0–99)
Total CHOL/HDL Ratio: 4.4 Ratio
Triglycerides: 99 mg/dL (ref <150)
VLDL: 20 mg/dL (ref 0–40)

## 2011-11-23 LAB — COMPLETE METABOLIC PANEL WITH GFR
ALT: 29 U/L (ref 0–35)
AST: 24 U/L (ref 0–37)
Albumin: 4.5 g/dL (ref 3.5–5.2)
Alkaline Phosphatase: 47 U/L (ref 39–117)
BUN: 16 mg/dL (ref 6–23)
CO2: 28 mEq/L (ref 19–32)
Calcium: 10.1 mg/dL (ref 8.4–10.5)
Chloride: 100 mEq/L (ref 96–112)
Creat: 0.66 mg/dL (ref 0.50–1.10)
GFR, Est African American: >89 mL/min
GFR, Est Non African American: 87 mL/min
Glucose, Bld: 106 mg/dL — ABNORMAL HIGH (ref 70–99)
Potassium: 4.6 mEq/L (ref 3.5–5.3)
Sodium: 137 mEq/L (ref 135–145)
Total Bilirubin: 0.4 mg/dL (ref 0.3–1.2)
Total Protein: 7.3 g/dL (ref 6.0–8.3)

## 2011-11-27 ENCOUNTER — Ambulatory Visit: Payer: Medicare Other | Admitting: Family Medicine

## 2011-11-27 DIAGNOSIS — Z7982 Long term (current) use of aspirin: Secondary | ICD-10-CM | POA: Diagnosis not present

## 2011-11-27 DIAGNOSIS — Z853 Personal history of malignant neoplasm of breast: Secondary | ICD-10-CM | POA: Diagnosis not present

## 2011-11-27 DIAGNOSIS — Z79899 Other long term (current) drug therapy: Secondary | ICD-10-CM | POA: Diagnosis not present

## 2011-11-27 DIAGNOSIS — I10 Essential (primary) hypertension: Secondary | ICD-10-CM | POA: Diagnosis not present

## 2011-11-27 DIAGNOSIS — E119 Type 2 diabetes mellitus without complications: Secondary | ICD-10-CM | POA: Diagnosis not present

## 2011-11-27 DIAGNOSIS — Z888 Allergy status to other drugs, medicaments and biological substances status: Secondary | ICD-10-CM | POA: Diagnosis not present

## 2011-11-27 DIAGNOSIS — C50919 Malignant neoplasm of unspecified site of unspecified female breast: Secondary | ICD-10-CM | POA: Diagnosis not present

## 2011-11-27 DIAGNOSIS — Z885 Allergy status to narcotic agent status: Secondary | ICD-10-CM | POA: Diagnosis not present

## 2011-11-27 DIAGNOSIS — Z9221 Personal history of antineoplastic chemotherapy: Secondary | ICD-10-CM | POA: Diagnosis not present

## 2011-11-27 DIAGNOSIS — Z9010 Acquired absence of unspecified breast and nipple: Secondary | ICD-10-CM | POA: Diagnosis not present

## 2011-11-28 ENCOUNTER — Encounter: Payer: Self-pay | Admitting: Family Medicine

## 2011-11-28 ENCOUNTER — Ambulatory Visit (INDEPENDENT_AMBULATORY_CARE_PROVIDER_SITE_OTHER): Payer: Medicare Other | Admitting: Family Medicine

## 2011-11-28 VITALS — BP 124/78 | HR 88 | Resp 15 | Ht 63.5 in | Wt 193.0 lb

## 2011-11-28 DIAGNOSIS — E785 Hyperlipidemia, unspecified: Secondary | ICD-10-CM | POA: Diagnosis not present

## 2011-11-28 DIAGNOSIS — I10 Essential (primary) hypertension: Secondary | ICD-10-CM

## 2011-11-28 DIAGNOSIS — E669 Obesity, unspecified: Secondary | ICD-10-CM

## 2011-11-28 DIAGNOSIS — E119 Type 2 diabetes mellitus without complications: Secondary | ICD-10-CM

## 2011-11-28 DIAGNOSIS — Z23 Encounter for immunization: Secondary | ICD-10-CM | POA: Diagnosis not present

## 2011-11-28 MED ORDER — METOPROLOL SUCCINATE ER 25 MG PO TB24: 25.0000 mg | ORAL_TABLET | Freq: Every day | ORAL | Status: DC

## 2011-11-28 MED ORDER — METFORMIN HCL 1000 MG PO TABS: ORAL_TABLET | ORAL | Status: DC

## 2011-11-28 MED ORDER — OMEPRAZOLE 40 MG PO CPDR: DELAYED_RELEASE_CAPSULE | ORAL | Status: DC

## 2011-11-28 MED ORDER — PRAVASTATIN SODIUM 20 MG PO TABS: ORAL_TABLET | ORAL | Status: DC

## 2011-11-28 MED ORDER — TRIAMTERENE-HCTZ 75-50 MG PO TABS: ORAL_TABLET | ORAL | Status: DC

## 2011-11-28 MED ORDER — POTASSIUM CHLORIDE CRYS ER 20 MEQ PO TBCR: EXTENDED_RELEASE_TABLET | ORAL | Status: DC

## 2011-11-28 MED ORDER — AMLODIPINE BESYLATE 5 MG PO TABS: 5.0000 mg | ORAL_TABLET | Freq: Every day | ORAL | Status: DC

## 2011-11-28 NOTE — Assessment & Plan Note (Signed)
Controlled, no change in medication DASH diet and commitment to daily physical activity for a minimum of 30 minutes discussed and encouraged, as a part of hypertension management. The importance of attaining a healthy weight is also discussed.  

## 2011-11-28 NOTE — Assessment & Plan Note (Signed)
Controlled, no change in medication Patient advised to reduce carb and sweets, commit to regular physical activity, take meds as prescribed, test blood as directed, and attempt to lose weight, to improve blood sugar control.  

## 2011-11-28 NOTE — Progress Notes (Signed)
  Subjective:    Patient ID: Lisa Mann, female    DOB: 04-Jun-1933, 78 y.o.   MRN: [id number]  HPI The PT is here for follow up and re-evaluation of chronic medical conditions, medication management and review of any available recent lab and radiology data.  Preventive health is updated, specifically  Cancer screening and Immunization.   Questions or concerns regarding consultations or procedures which the PT has had in the interim are  Addressed.Just had f/u yesterday with oncology at [facility] , has a good report Unfortunately she unexpectedly lost her only son in September and is still trying to deal the grief of this loss Denies polyuria, polydipsia or blurred vision       Review of Systems See HPI Denies recent fever or chills. Denies sinus pressure, nasal congestion, ear pain or sore throat. Denies chest congestion, productive cough or wheezing. Denies chest pains, palpitations and leg swelling Denies abdominal pain, nausea, vomiting,diarrhea or constipation.   Denies dysuria, frequency, hesitancy or incontinence. Denies joint pain, swelling and limitation in mobility. Denies headaches, seizures, numbness, or tingling.  Denies skin break down or rash.        Objective:   Physical Exam  Patient alert and oriented and in no cardiopulmonary distress.  HEENT: No facial asymmetry, EOMI, no sinus tenderness,  oropharynx pink and moist.  Neck supple no adenopathy.  Chest: Clear to auscultation bilaterally.  CVS: S1, S2 no murmurs, no S3.  ABD: Soft non tender. Bowel sounds normal.  Ext: No edema  MS: Adequate ROM spine, shoulders, hips and knees.  Skin: Intact, no ulcerations or rash noted.  Psych: Good eye contact, normal affect. Memory intact not anxious or depressed appearing.  CNS: CN 2-12 intact, power, tone and sensation normal throughout. Diabetic Foot Check:  Appearance - no lesions, ulcers or calluses Skin - no unusual pallor or redness Pulses Left -  Dorsalis Pedis and Posterior Tibia normal Right - Dorsalis Pedis and Posterior Tibia normal       Assessment & Plan:

## 2011-11-28 NOTE — Patient Instructions (Addendum)
Annual wellness in April  Please get non fasting hepatic and HBa1C mid January    Fasrting lipid cmp and EGFR and HBA1C mid April  Please get flu vaccine today   Congrats on weight loss.  Please try to commit to daily exercise , excellent for your health

## 2011-11-28 NOTE — Assessment & Plan Note (Signed)
Slightly improved, and LFT normal will try low dose statin with close f/u

## 2011-11-28 NOTE — Assessment & Plan Note (Signed)
Improved. Pt applauded on succesful weight loss through lifestyle change, and encouraged to continue same. Weight loss goal set for the next several months.  

## 2011-12-04 DIAGNOSIS — H18519 Endothelial corneal dystrophy, unspecified eye: Secondary | ICD-10-CM | POA: Diagnosis not present

## 2011-12-04 DIAGNOSIS — H2010 Chronic iridocyclitis, unspecified eye: Secondary | ICD-10-CM | POA: Diagnosis not present

## 2011-12-04 DIAGNOSIS — E119 Type 2 diabetes mellitus without complications: Secondary | ICD-10-CM | POA: Diagnosis not present

## 2011-12-04 DIAGNOSIS — H2510 Age-related nuclear cataract, unspecified eye: Secondary | ICD-10-CM | POA: Diagnosis not present

## 2011-12-30 DIAGNOSIS — H18519 Endothelial corneal dystrophy, unspecified eye: Secondary | ICD-10-CM | POA: Diagnosis not present

## 2011-12-30 DIAGNOSIS — H2010 Chronic iridocyclitis, unspecified eye: Secondary | ICD-10-CM | POA: Diagnosis not present

## 2012-01-10 ENCOUNTER — Other Ambulatory Visit: Payer: Self-pay | Admitting: Family Medicine

## 2012-01-13 ENCOUNTER — Other Ambulatory Visit: Payer: Self-pay | Admitting: Family Medicine

## 2012-01-22 ENCOUNTER — Telehealth: Payer: Self-pay | Admitting: Family Medicine

## 2012-01-22 DIAGNOSIS — E785 Hyperlipidemia, unspecified: Secondary | ICD-10-CM | POA: Diagnosis not present

## 2012-01-22 DIAGNOSIS — E119 Type 2 diabetes mellitus without complications: Secondary | ICD-10-CM | POA: Diagnosis not present

## 2012-01-22 LAB — HEPATIC FUNCTION PANEL
ALT: 25 U/L (ref 0–35)
AST: 23 U/L (ref 0–37)
Albumin: 4.4 g/dL (ref 3.5–5.2)
Alkaline Phosphatase: 48 U/L (ref 39–117)
Bilirubin, Direct: 0.1 mg/dL (ref 0.0–0.3)
Indirect Bilirubin: 0.3 mg/dL (ref 0.0–0.9)
Total Bilirubin: 0.4 mg/dL (ref 0.3–1.2)
Total Protein: 7.2 g/dL (ref 6.0–8.3)

## 2012-01-22 LAB — HEMOGLOBIN A1C
Hgb A1c MFr Bld: 6.7 % — ABNORMAL HIGH (ref <5.7)
Mean Plasma Glucose: 146 mg/dL — ABNORMAL HIGH (ref <117)

## 2012-01-22 NOTE — Telephone Encounter (Signed)
Pt called in has been feeling dizzy all day, her blood pressure has been running low, 104/71 and 105/70, denies HA, N/V, CP, SOB, feels well otherwise CBG 105, reviewed medications with her, she is to hold toprol this evening. If symptoms worsen go to ER if needed, otherwise she can call in AM for appt if not resolved.

## 2012-01-23 ENCOUNTER — Other Ambulatory Visit: Payer: Self-pay | Admitting: Family Medicine

## 2012-01-23 NOTE — Telephone Encounter (Signed)
Called pt she is to stop the toprol, states she feels better, will come in 1/23 at 8:30 am to see me

## 2012-01-23 NOTE — Telephone Encounter (Signed)
pls sched appt with me next week for blood pressure check

## 2012-01-26 ENCOUNTER — Other Ambulatory Visit: Payer: Self-pay | Admitting: Family Medicine

## 2012-01-30 ENCOUNTER — Encounter: Payer: Self-pay | Admitting: Family Medicine

## 2012-01-30 ENCOUNTER — Ambulatory Visit (INDEPENDENT_AMBULATORY_CARE_PROVIDER_SITE_OTHER): Payer: Medicare Other | Admitting: Family Medicine

## 2012-01-30 VITALS — BP 120/80 | HR 96 | Resp 16 | Ht 63.5 in | Wt 192.0 lb

## 2012-01-30 DIAGNOSIS — E119 Type 2 diabetes mellitus without complications: Secondary | ICD-10-CM | POA: Diagnosis not present

## 2012-01-30 DIAGNOSIS — I10 Essential (primary) hypertension: Secondary | ICD-10-CM | POA: Diagnosis not present

## 2012-01-30 DIAGNOSIS — E669 Obesity, unspecified: Secondary | ICD-10-CM

## 2012-01-30 DIAGNOSIS — E785 Hyperlipidemia, unspecified: Secondary | ICD-10-CM | POA: Diagnosis not present

## 2012-01-30 MED ORDER — RAMIPRIL 5 MG PO CAPS: 5.0000 mg | ORAL_CAPSULE | Freq: Every day | ORAL | Status: DC

## 2012-01-30 NOTE — Patient Instructions (Addendum)
F/u in 4 months, diabetic foot exam at the time.  Blood pressure excellent today ,take only the meds you are currently taking.  Please again try the pravachol, this should not raise your blood pressure.  It is important that you exercise regularly at least 30 minutes 5 times a week. If you develop chest pain, have severe difficulty breathing, or feel very tired, stop exercising immediately and seek medical attention   A healthy diet is rich in fruit, vegetables and whole grains. Poultry fish, nuts and beans are a healthy choice for protein rather then red meat. A low sodium diet and drinking 64 ounces of water daily is generally recommended. Oils and sweet should be limited. Carbohydrates especially for those who are diabetic or overweight, should be limited to 34-45 gram per meal. It is important to eat on a regular schedule, at least 3 times daily. Snacks should be primarily fruits, vegetables or nuts.  Weight loss goal of 5 pounds.  Fasting lipid, cmp and EGFr and HBA1C in 4 month

## 2012-01-30 NOTE — Progress Notes (Signed)
  Subjective:    Patient ID: Lisa Mann, female    DOB: 09-Jul-1933, 79 y.o.   MRN: [id number]  HPI Pt in for blood pressure check primarily, recently experienced light headedness and her bP was found to be low C/o blood sugar elevation with pravachol so has not taken Recent labs are also reviewed at thsi visit, and med adjustments made   Review of Systems See HPI Denies recent fever or chills. Denies sinus pressure, nasal congestion, ear pain or sore throat. Denies chest congestion, productive cough or wheezing. Denies chest pains, palpitations and leg swelling Denies abdominal pain, nausea, vomiting,diarrhea or constipation.   Denies dysuria, frequency, hesitancy or incontinence. Denies joint pain, swelling and limitation in mobility. Denies headaches, seizures, numbness, or tingling. Denies depression, anxiety or insomnia. Denies skin break down or rash.        Objective:   Physical Exam Patient alert and oriented and in no cardiopulmonary distress.  HEENT: No facial asymmetry, EOMI, no sinus tenderness,  oropharynx pink and moist.  Neck supple no adenopathy.  Chest: Clear to auscultation bilaterally.  CVS: S1, S2 no murmurs, no S3.  ABD: Soft non tender. Bowel sounds normal.  Ext: No edema  MS: Adequate ROM spine, shoulders, hips and knees.  Skin: Intact, no ulcerations or rash noted.  Psych: Good eye contact, normal affect. Memory intact not anxious or depressed appearing.  CNS: CN 2-12 intact, power, tone and sensation normal throughout.        Assessment & Plan:

## 2012-01-30 NOTE — Assessment & Plan Note (Signed)
Uncontrolled, counseled pt to attempt to take the pravachol which she thinks is raising her blood sugar as directed, 3 times weekly

## 2012-01-30 NOTE — Telephone Encounter (Signed)
Pt has appt 01/30/2012

## 2012-01-30 NOTE — Assessment & Plan Note (Signed)
Controlled, no change in medication DASH diet and commitment to daily physical activity for a minimum of 30 minutes discussed and encouraged, as a part of hypertension management. The importance of attaining a healthy weight is also discussed.  

## 2012-01-30 NOTE — Assessment & Plan Note (Signed)
Unchanged. Patient re-educated about  the importance of commitment to a  minimum of 150 minutes of exercise per week. The importance of healthy food choices with portion control discussed. Encouraged to start a food diary, count calories and to consider  joining a support group. Sample diet sheets offered. Goals set by the patient for the next several months.    

## 2012-01-30 NOTE — Assessment & Plan Note (Signed)
Controlled, no change in medication Patient advised to reduce carb and sweets, commit to regular physical activity, take meds as prescribed, test blood as directed, and attempt to lose weight, to improve blood sugar control.  

## 2012-02-10 ENCOUNTER — Other Ambulatory Visit: Payer: Self-pay | Admitting: Family Medicine

## 2012-02-10 DIAGNOSIS — H2000 Unspecified acute and subacute iridocyclitis: Secondary | ICD-10-CM | POA: Diagnosis not present

## 2012-02-10 DIAGNOSIS — H2510 Age-related nuclear cataract, unspecified eye: Secondary | ICD-10-CM | POA: Diagnosis not present

## 2012-02-10 DIAGNOSIS — H18519 Endothelial corneal dystrophy, unspecified eye: Secondary | ICD-10-CM | POA: Diagnosis not present

## 2012-02-13 DIAGNOSIS — E1149 Type 2 diabetes mellitus with other diabetic neurological complication: Secondary | ICD-10-CM | POA: Diagnosis not present

## 2012-02-13 DIAGNOSIS — E119 Type 2 diabetes mellitus without complications: Secondary | ICD-10-CM | POA: Diagnosis not present

## 2012-02-28 ENCOUNTER — Other Ambulatory Visit: Payer: Self-pay | Admitting: Family Medicine

## 2012-03-11 ENCOUNTER — Other Ambulatory Visit: Payer: Self-pay | Admitting: Family Medicine

## 2012-03-16 DIAGNOSIS — H18519 Endothelial corneal dystrophy, unspecified eye: Secondary | ICD-10-CM | POA: Diagnosis not present

## 2012-03-16 DIAGNOSIS — H2510 Age-related nuclear cataract, unspecified eye: Secondary | ICD-10-CM | POA: Diagnosis not present

## 2012-03-16 DIAGNOSIS — H2010 Chronic iridocyclitis, unspecified eye: Secondary | ICD-10-CM | POA: Diagnosis not present

## 2012-03-21 ENCOUNTER — Other Ambulatory Visit: Payer: Self-pay | Admitting: Family Medicine

## 2012-04-11 ENCOUNTER — Other Ambulatory Visit: Payer: Self-pay | Admitting: Family Medicine

## 2012-04-13 ENCOUNTER — Other Ambulatory Visit: Payer: Self-pay | Admitting: Family Medicine

## 2012-04-16 DIAGNOSIS — E119 Type 2 diabetes mellitus without complications: Secondary | ICD-10-CM | POA: Diagnosis not present

## 2012-04-16 DIAGNOSIS — E1149 Type 2 diabetes mellitus with other diabetic neurological complication: Secondary | ICD-10-CM | POA: Diagnosis not present

## 2012-04-18 ENCOUNTER — Other Ambulatory Visit: Payer: Self-pay | Admitting: Family Medicine

## 2012-04-20 ENCOUNTER — Other Ambulatory Visit: Payer: Self-pay | Admitting: Family Medicine

## 2012-04-27 DIAGNOSIS — H2000 Unspecified acute and subacute iridocyclitis: Secondary | ICD-10-CM | POA: Diagnosis not present

## 2012-04-27 DIAGNOSIS — H40059 Ocular hypertension, unspecified eye: Secondary | ICD-10-CM | POA: Diagnosis not present

## 2012-04-27 DIAGNOSIS — H18519 Endothelial corneal dystrophy, unspecified eye: Secondary | ICD-10-CM | POA: Diagnosis not present

## 2012-04-27 DIAGNOSIS — H2510 Age-related nuclear cataract, unspecified eye: Secondary | ICD-10-CM | POA: Diagnosis not present

## 2012-04-28 ENCOUNTER — Encounter: Payer: Medicare Other | Admitting: Family Medicine

## 2012-05-02 ENCOUNTER — Other Ambulatory Visit: Payer: Self-pay | Admitting: Family Medicine

## 2012-05-11 DIAGNOSIS — H40059 Ocular hypertension, unspecified eye: Secondary | ICD-10-CM | POA: Diagnosis not present

## 2012-05-11 DIAGNOSIS — H18519 Endothelial corneal dystrophy, unspecified eye: Secondary | ICD-10-CM | POA: Diagnosis not present

## 2012-05-11 DIAGNOSIS — H2000 Unspecified acute and subacute iridocyclitis: Secondary | ICD-10-CM | POA: Diagnosis not present

## 2012-05-11 DIAGNOSIS — H2510 Age-related nuclear cataract, unspecified eye: Secondary | ICD-10-CM | POA: Diagnosis not present

## 2012-05-22 DIAGNOSIS — H3020 Posterior cyclitis, unspecified eye: Secondary | ICD-10-CM | POA: Insufficient documentation

## 2012-05-22 DIAGNOSIS — H2010 Chronic iridocyclitis, unspecified eye: Secondary | ICD-10-CM | POA: Diagnosis not present

## 2012-05-23 ENCOUNTER — Other Ambulatory Visit: Payer: Self-pay | Admitting: Family Medicine

## 2012-05-25 DIAGNOSIS — E785 Hyperlipidemia, unspecified: Secondary | ICD-10-CM | POA: Diagnosis not present

## 2012-05-25 DIAGNOSIS — E119 Type 2 diabetes mellitus without complications: Secondary | ICD-10-CM | POA: Diagnosis not present

## 2012-05-25 LAB — LIPID PANEL
Cholesterol: 204 mg/dL — ABNORMAL HIGH (ref 0–200)
HDL: 47 mg/dL (ref >39)
LDL Cholesterol: 136 mg/dL — ABNORMAL HIGH (ref 0–99)
Total CHOL/HDL Ratio: 4.3 Ratio
Triglycerides: 107 mg/dL (ref <150)
VLDL: 21 mg/dL (ref 0–40)

## 2012-05-25 LAB — HEMOGLOBIN A1C
Hgb A1c MFr Bld: 6.5 % — ABNORMAL HIGH (ref <5.7)
Mean Plasma Glucose: 140 mg/dL — ABNORMAL HIGH (ref <117)

## 2012-05-25 LAB — COMPLETE METABOLIC PANEL WITH GFR
ALT: 25 U/L (ref 0–35)
AST: 23 U/L (ref 0–37)
Albumin: 4.3 g/dL (ref 3.5–5.2)
Alkaline Phosphatase: 50 U/L (ref 39–117)
BUN: 14 mg/dL (ref 6–23)
CO2: 30 mEq/L (ref 19–32)
Calcium: 10.3 mg/dL (ref 8.4–10.5)
Chloride: 97 mEq/L (ref 96–112)
Creat: 0.6 mg/dL (ref 0.50–1.10)
GFR, Est African American: >89 mL/min
GFR, Est Non African American: 89 mL/min
Glucose, Bld: 111 mg/dL — ABNORMAL HIGH (ref 70–99)
Potassium: 4.3 mEq/L (ref 3.5–5.3)
Sodium: 137 mEq/L (ref 135–145)
Total Bilirubin: 0.4 mg/dL (ref 0.3–1.2)
Total Protein: 7.4 g/dL (ref 6.0–8.3)

## 2012-05-27 DIAGNOSIS — H179 Unspecified corneal scar and opacity: Secondary | ICD-10-CM | POA: Diagnosis not present

## 2012-05-27 DIAGNOSIS — G589 Mononeuropathy, unspecified: Secondary | ICD-10-CM | POA: Diagnosis not present

## 2012-05-27 DIAGNOSIS — I10 Essential (primary) hypertension: Secondary | ICD-10-CM | POA: Diagnosis not present

## 2012-05-27 DIAGNOSIS — I499 Cardiac arrhythmia, unspecified: Secondary | ICD-10-CM | POA: Diagnosis not present

## 2012-05-27 DIAGNOSIS — Z9010 Acquired absence of unspecified breast and nipple: Secondary | ICD-10-CM | POA: Diagnosis not present

## 2012-05-27 DIAGNOSIS — E119 Type 2 diabetes mellitus without complications: Secondary | ICD-10-CM | POA: Diagnosis not present

## 2012-05-27 DIAGNOSIS — C50919 Malignant neoplasm of unspecified site of unspecified female breast: Secondary | ICD-10-CM | POA: Diagnosis not present

## 2012-05-27 DIAGNOSIS — H2010 Chronic iridocyclitis, unspecified eye: Secondary | ICD-10-CM | POA: Diagnosis not present

## 2012-05-28 ENCOUNTER — Encounter: Payer: Self-pay | Admitting: Family Medicine

## 2012-05-28 ENCOUNTER — Telehealth: Payer: Self-pay | Admitting: Family Medicine

## 2012-05-28 ENCOUNTER — Other Ambulatory Visit: Payer: Self-pay

## 2012-05-28 ENCOUNTER — Ambulatory Visit (INDEPENDENT_AMBULATORY_CARE_PROVIDER_SITE_OTHER): Payer: Medicare Other | Admitting: Family Medicine

## 2012-05-28 VITALS — BP 118/70 | HR 90 | Resp 16 | Ht 63.5 in | Wt 190.0 lb

## 2012-05-28 DIAGNOSIS — E119 Type 2 diabetes mellitus without complications: Secondary | ICD-10-CM

## 2012-05-28 DIAGNOSIS — E785 Hyperlipidemia, unspecified: Secondary | ICD-10-CM | POA: Diagnosis not present

## 2012-05-28 DIAGNOSIS — E669 Obesity, unspecified: Secondary | ICD-10-CM

## 2012-05-28 DIAGNOSIS — R002 Palpitations: Secondary | ICD-10-CM | POA: Diagnosis not present

## 2012-05-28 DIAGNOSIS — F32A Depression, unspecified: Secondary | ICD-10-CM

## 2012-05-28 DIAGNOSIS — I10 Essential (primary) hypertension: Secondary | ICD-10-CM

## 2012-05-28 DIAGNOSIS — R5383 Other fatigue: Secondary | ICD-10-CM

## 2012-05-28 DIAGNOSIS — M109 Gout, unspecified: Secondary | ICD-10-CM

## 2012-05-28 DIAGNOSIS — R5381 Other malaise: Secondary | ICD-10-CM

## 2012-05-28 DIAGNOSIS — F329 Major depressive disorder, single episode, unspecified: Secondary | ICD-10-CM

## 2012-05-28 MED ORDER — POTASSIUM CHLORIDE CRYS ER 20 MEQ PO TBCR: EXTENDED_RELEASE_TABLET | ORAL | Status: DC

## 2012-05-28 MED ORDER — METFORMIN HCL 1000 MG PO TABS: ORAL_TABLET | ORAL | Status: DC

## 2012-05-28 MED ORDER — PRAVASTATIN SODIUM 20 MG PO TABS: ORAL_TABLET | ORAL | Status: DC

## 2012-05-28 MED ORDER — AMLODIPINE BESYLATE 5 MG PO TABS: ORAL_TABLET | ORAL | Status: DC

## 2012-05-28 NOTE — Telephone Encounter (Signed)
noted 

## 2012-05-28 NOTE — Progress Notes (Signed)
  Subjective:    Patient ID: Lisa Mann, female    DOB: 1933-07-14, 79 y.o.   MRN: [id number]  HPI The PT is here for follow up and re-evaluation of chronic medical conditions, medication management and review of any available recent lab and radiology data.  Preventive health is updated, specifically  Cancer screening and Immunization.   Questions or concerns regarding consultations or procedures which the PT has had in the interim are  Addressed.Recently evaluated by oncology at [facility] for bilateral breast cancer, with excellent report. States she was told her heart sounded irregular and sheshoul follow up with PCP.Pt denies symptoms The PT denies any adverse reactions to current medications since the last visit.  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 Plans to start grief counseling in group session, not suicidal or homicidal      Review of Systems See HPI Denies recent fever or chills. Denies sinus pressure, nasal congestion, ear pain or sore throat. Denies chest congestion, productive cough or wheezing. Denies chest pains, palpitations and leg swelling Denies abdominal pain, nausea, vomiting,diarrhea or constipation.   Denies dysuria, frequency, hesitancy or incontinence. Denies joint pain, swelling and limitation in mobility. Denies headaches, seizures, numbness, or tingling. Denies  anxiety or insomnia. Denies skin break down or rash.        Objective:   Physical Exam  Patient alert and oriented and in no cardiopulmonary distress.  HEENT: No facial asymmetry, EOMI, no sinus tenderness,  oropharynx pink and moist.  Neck supple no adenopathy.  Chest: Clear to auscultation bilaterally.  CVS: S1, S2 no murmurs, no S3.  ABD: Soft non tender. Bowel sounds normal.  Ext: No edema  MS: Adequate ROM spine, shoulders, hips and knees.  Skin: Intact, no ulcerations or rash  noted.  Psych: Good eye contact, normal affect. Memory intact not anxious or depressed appearing.Slightly tearful at times  CNS: CN 2-12 intact, power, tone  normal throughout.       Assessment & Plan:

## 2012-05-28 NOTE — Patient Instructions (Addendum)
F/u in 4.5  Months, call if you need me before  I strongly recommend therapy, please call if you decide to go.  EKG today is normal    HBA1C , fasting lipid, cmp and EGFR, CBC, tSH and uric acid levels in 4.5 month

## 2012-05-28 NOTE — Telephone Encounter (Signed)
The last note has been faxed

## 2012-05-29 LAB — MICROALBUMIN / CREATININE URINE RATIO
Creatinine, Urine: 71.4 mg/dL
Microalb Creat Ratio: 7 mg/g (ref 0.0–30.0)
Microalb, Ur: 0.5 mg/dL (ref 0.00–1.89)

## 2012-06-01 DIAGNOSIS — F329 Major depressive disorder, single episode, unspecified: Secondary | ICD-10-CM | POA: Insufficient documentation

## 2012-06-01 DIAGNOSIS — F32A Depression, unspecified: Secondary | ICD-10-CM | POA: Insufficient documentation

## 2012-06-01 NOTE — Assessment & Plan Note (Signed)
Recently advised of "irregular heart beat" by her oncologist at [facility]. Pt dsenies symptoms, office EKG shows NSR. Advised to contact office or ED if becomes symptomatic

## 2012-06-01 NOTE — Assessment & Plan Note (Signed)
Improved, though still uncontrolled. Hyperlipidemia:Low fat diet discussed and encouraged.

## 2012-06-01 NOTE — Assessment & Plan Note (Signed)
Controlled, no change in medication DASH diet and commitment to daily physical activity for a minimum of 30 minutes discussed and encouraged, as a part of hypertension management. The importance of attaining a healthy weight is also discussed.  

## 2012-06-01 NOTE — Assessment & Plan Note (Signed)
Mild depression following loss of ailing spouse, followed by unexpected loss of her only son. 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 Reports sleeping 12 hours per day. States she is going to join a group for grief therapy with friends and will also consider seniors group Unwilling to go to individual counseling at this time though I believe it will help alot

## 2012-06-01 NOTE — Assessment & Plan Note (Signed)
Controlled, no change in medication Patient advised to reduce carb and sweets, commit to regular physical activity, take meds as prescribed, test blood as directed, and attempt to lose weight, to improve blood sugar control.  

## 2012-06-01 NOTE — Assessment & Plan Note (Signed)
Unchanged. Patient re-educated about  the importance of commitment to a  minimum of 150 minutes of exercise per week. The importance of healthy food choices with portion control discussed. Encouraged to start a food diary, count calories and to consider  joining a support group. Sample diet sheets offered. Goals set by the patient for the next several months.    

## 2012-06-02 ENCOUNTER — Telehealth: Payer: Self-pay | Admitting: Family Medicine

## 2012-06-02 NOTE — Telephone Encounter (Signed)
Please advise 

## 2012-06-02 NOTE — Telephone Encounter (Signed)
Ok for her to use alleve one twice daily for 5 days

## 2012-06-02 NOTE — Telephone Encounter (Signed)
Patient aware.

## 2012-06-08 ENCOUNTER — Telehealth: Payer: Self-pay

## 2012-06-08 ENCOUNTER — Ambulatory Visit (INDEPENDENT_AMBULATORY_CARE_PROVIDER_SITE_OTHER): Payer: Medicare Other

## 2012-06-08 VITALS — BP 140/70 | Wt 191.1 lb

## 2012-06-08 DIAGNOSIS — M25562 Pain in left knee: Secondary | ICD-10-CM

## 2012-06-08 DIAGNOSIS — M25569 Pain in unspecified knee: Secondary | ICD-10-CM

## 2012-06-08 MED ORDER — KETOROLAC TROMETHAMINE 60 MG/2ML IM SOLN
60.0000 mg | Freq: Once | INTRAMUSCULAR | Status: AC
  Administered 2012-06-08: 60 mg via INTRAMUSCULAR

## 2012-06-08 MED ORDER — METHYLPREDNISOLONE ACETATE 80 MG/ML IJ SUSP
80.0000 mg | Freq: Once | INTRAMUSCULAR | Status: AC
  Administered 2012-06-08: 80 mg via INTRAMUSCULAR

## 2012-06-08 NOTE — Telephone Encounter (Signed)
Noted  

## 2012-06-08 NOTE — Telephone Encounter (Signed)
pls administer toradol 60mg  IM and depo medrol 80mg  IM in office today for lower extremity pain, she has bulging disc in her back

## 2012-06-08 NOTE — Progress Notes (Signed)
Patient in office now wanting to know if she can get a pain injection for her left leg. Had been using aleve but she went to pick up her grandson and its been hurting since in shin area and up her leg to her upper leg and thigh. Ok to give depo and toradol per Dr Lodema Hong

## 2012-06-08 NOTE — Telephone Encounter (Signed)
Patient in office now wanting to know if she can get a pain injection for her left leg. Had been using aleve but she went to pick up her grandson and its been hurting since in shin area and up her leg to her upper leg and thigh. Can she get injections today?

## 2012-06-11 ENCOUNTER — Encounter: Payer: Self-pay | Admitting: Family Medicine

## 2012-06-15 ENCOUNTER — Telehealth: Payer: Self-pay

## 2012-06-15 NOTE — Telephone Encounter (Signed)
Taking Acyclovir Wants to know if its ok to take with her other meds and if it can harm her kidneys? Asked the pharmacist and he said he wasn't sure and to ask her DR

## 2012-06-16 NOTE — Telephone Encounter (Signed)
This is OK, let her know pls

## 2012-06-17 ENCOUNTER — Telehealth: Payer: Self-pay | Admitting: Family Medicine

## 2012-06-17 ENCOUNTER — Other Ambulatory Visit: Payer: Self-pay | Admitting: Family Medicine

## 2012-06-17 DIAGNOSIS — M549 Dorsalgia, unspecified: Secondary | ICD-10-CM

## 2012-06-17 NOTE — Telephone Encounter (Signed)
Best for her to return for an epidural which she has benefited from in the past. Pls confirm witgh her where she had it before and send her back there, referral is entered

## 2012-06-17 NOTE — Telephone Encounter (Signed)
Is it too soon to get another injection? Does she need referral?

## 2012-06-18 ENCOUNTER — Telehealth: Payer: Self-pay | Admitting: Family Medicine

## 2012-06-18 NOTE — Telephone Encounter (Signed)
Called patient and left message for them to return call at the office   

## 2012-06-19 ENCOUNTER — Encounter: Payer: Self-pay | Admitting: Family Medicine

## 2012-06-19 ENCOUNTER — Ambulatory Visit (INDEPENDENT_AMBULATORY_CARE_PROVIDER_SITE_OTHER): Payer: Medicare Other | Admitting: Family Medicine

## 2012-06-19 VITALS — BP 130/82 | HR 93 | Resp 16 | Ht 63.5 in | Wt 191.0 lb

## 2012-06-19 DIAGNOSIS — I10 Essential (primary) hypertension: Secondary | ICD-10-CM | POA: Diagnosis not present

## 2012-06-19 DIAGNOSIS — M533 Sacrococcygeal disorders, not elsewhere classified: Secondary | ICD-10-CM | POA: Diagnosis not present

## 2012-06-19 MED ORDER — TIZANIDINE HCL 2 MG PO CAPS: ORAL_CAPSULE | ORAL | Status: DC

## 2012-06-19 MED ORDER — KETOROLAC TROMETHAMINE 60 MG/2ML IM SOLN
60.0000 mg | Freq: Once | INTRAMUSCULAR | Status: AC
  Administered 2012-06-19: 60 mg via INTRAMUSCULAR

## 2012-06-19 MED ORDER — METHYLPREDNISOLONE ACETATE 80 MG/ML IJ SUSP
80.0000 mg | Freq: Once | INTRAMUSCULAR | Status: AC
  Administered 2012-06-19: 80 mg via INTRAMUSCULAR

## 2012-06-19 MED ORDER — PREDNISONE 10 MG PO TABS: ORAL_TABLET | ORAL | Status: AC

## 2012-06-19 NOTE — Patient Instructions (Addendum)
F/u as before  You are having acute back pain form nerve irritation "sciatica"  Toradol and depo medrol in the office followed by prednisone for 1 week and muscle relaxant, call if not better please

## 2012-06-19 NOTE — Progress Notes (Signed)
  Subjective:    Patient ID: Lisa Mann, female    DOB: Aug 20, 1933, 79 y.o.   MRN: [id number]  HPI 3 week h/o low back pain radiating down left thigh, no aggravating  Factor noted. Denies red flag symptoms. Has established disc disease. Had received injections in the office a few weeks ago which helped slightly, no recall of epidural injections in the past and none on visible record   Review of Systems See HPI Denies recent fever or chills. Denies sinus pressure, nasal congestion, ear pain or sore throat. Denies chest congestion, productive cough or wheezing. Denies chest pains, palpitations and leg swelling  Denies dysuria, frequency, hesitancy or incontinence.        Objective:   Physical Exam  Patient alert and oriented and in no cardiopulmonary distress.Pt in pain, with limitation in mobility from back pain  HEENT: No facial asymmetry, EOMI, no sinus tenderness,  oropharynx pink and moist.  Neck supple no adenopathy.  Chest: Clear to auscultation bilaterally.  CVS: S1, S2 no murmurs, no S3.  ABD: Soft non tender. Bowel sounds normal.  Ext: No edema  MS: decreased  ROM spine,adequate in  shoulders, hips and knees.  Skin: Intact, no ulcerations or rash noted.  CNS: CN 2-12 intact, power, tone and sensation normal throughout.       Assessment & Plan:

## 2012-06-19 NOTE — Telephone Encounter (Signed)
Never got in touch with patient and she made appt to come in today for her leg

## 2012-06-20 NOTE — Assessment & Plan Note (Signed)
Uncontrolled, pt has disc disease, anti inflammatories in office and prednisone in high dose, if no resolution, will need epidural

## 2012-06-20 NOTE — Assessment & Plan Note (Signed)
Controlled, no change in medication  

## 2012-06-22 NOTE — Telephone Encounter (Signed)
Attempted to notify patient but busy signal received from # listed.

## 2012-06-22 NOTE — Telephone Encounter (Signed)
Patient is aware 

## 2012-06-25 DIAGNOSIS — E1149 Type 2 diabetes mellitus with other diabetic neurological complication: Secondary | ICD-10-CM | POA: Diagnosis not present

## 2012-06-25 DIAGNOSIS — E119 Type 2 diabetes mellitus without complications: Secondary | ICD-10-CM | POA: Diagnosis not present

## 2012-07-02 ENCOUNTER — Other Ambulatory Visit (HOSPITAL_COMMUNITY): Payer: Self-pay | Admitting: Preventative Medicine

## 2012-07-02 ENCOUNTER — Telehealth: Payer: Self-pay

## 2012-07-02 DIAGNOSIS — M545 Low back pain, unspecified: Secondary | ICD-10-CM

## 2012-07-02 DIAGNOSIS — IMO0002 Reserved for concepts with insufficient information to code with codable children: Secondary | ICD-10-CM | POA: Diagnosis not present

## 2012-07-02 NOTE — Telephone Encounter (Signed)
Daughter called and said that her mother had been up vomiting all morning and was having severe pain still in her leg. She had been referred to [facility] and gave her the number to try to get a sooner appt then July 23. Also advised that if she was in severe pain to take her to be evaluated today at the ED. She agreed.

## 2012-07-06 ENCOUNTER — Ambulatory Visit (HOSPITAL_COMMUNITY)
Admission: RE | Admit: 2012-07-06 | Discharge: 2012-07-06 | Disposition: A | Discharge status: Home / Self Care | Payer: Medicare Other | Source: Ambulatory Visit | Attending: Preventative Medicine | Admitting: Preventative Medicine

## 2012-07-06 DIAGNOSIS — M51379 Other intervertebral disc degeneration, lumbosacral region without mention of lumbar back pain or lower extremity pain: Secondary | ICD-10-CM | POA: Insufficient documentation

## 2012-07-06 DIAGNOSIS — M545 Low back pain, unspecified: Secondary | ICD-10-CM | POA: Insufficient documentation

## 2012-07-06 DIAGNOSIS — H2010 Chronic iridocyclitis, unspecified eye: Secondary | ICD-10-CM | POA: Diagnosis not present

## 2012-07-06 DIAGNOSIS — M5137 Other intervertebral disc degeneration, lumbosacral region: Secondary | ICD-10-CM | POA: Insufficient documentation

## 2012-07-06 DIAGNOSIS — M48061 Spinal stenosis, lumbar region without neurogenic claudication: Secondary | ICD-10-CM | POA: Diagnosis not present

## 2012-07-06 DIAGNOSIS — M47817 Spondylosis without myelopathy or radiculopathy, lumbosacral region: Secondary | ICD-10-CM | POA: Diagnosis not present

## 2012-07-06 DIAGNOSIS — M5126 Other intervertebral disc displacement, lumbar region: Secondary | ICD-10-CM | POA: Insufficient documentation

## 2012-07-08 ENCOUNTER — Other Ambulatory Visit: Payer: Self-pay | Admitting: Family Medicine

## 2012-07-14 DIAGNOSIS — IMO0002 Reserved for concepts with insufficient information to code with codable children: Secondary | ICD-10-CM | POA: Diagnosis not present

## 2012-07-20 ENCOUNTER — Other Ambulatory Visit: Payer: Self-pay | Admitting: Family Medicine

## 2012-07-21 ENCOUNTER — Telehealth: Payer: Self-pay | Admitting: Family Medicine

## 2012-07-21 MED ORDER — METFORMIN HCL 1000 MG PO TABS: ORAL_TABLET | ORAL | Status: DC

## 2012-07-21 NOTE — Telephone Encounter (Signed)
Metformin sent in to [facility]

## 2012-08-10 ENCOUNTER — Telehealth: Payer: Self-pay | Admitting: Family Medicine

## 2012-08-10 NOTE — Telephone Encounter (Signed)
Yes ok for annual wellness in this month or after, and I will also do a rectal for cancer screening at that visit since due

## 2012-08-10 NOTE — Telephone Encounter (Signed)
Patient scheduled for AWV w/rectal on 08/13/12 @ 10:45 AM

## 2012-08-13 ENCOUNTER — Encounter: Payer: Self-pay | Admitting: Family Medicine

## 2012-08-13 ENCOUNTER — Ambulatory Visit (INDEPENDENT_AMBULATORY_CARE_PROVIDER_SITE_OTHER): Payer: Medicare Other | Admitting: Family Medicine

## 2012-08-13 VITALS — BP 138/82 | HR 92 | Resp 16 | Ht 63.5 in | Wt 193.1 lb

## 2012-08-13 DIAGNOSIS — E1142 Type 2 diabetes mellitus with diabetic polyneuropathy: Secondary | ICD-10-CM

## 2012-08-13 DIAGNOSIS — E1149 Type 2 diabetes mellitus with other diabetic neurological complication: Secondary | ICD-10-CM | POA: Diagnosis not present

## 2012-08-13 DIAGNOSIS — E669 Obesity, unspecified: Secondary | ICD-10-CM

## 2012-08-13 DIAGNOSIS — E785 Hyperlipidemia, unspecified: Secondary | ICD-10-CM

## 2012-08-13 DIAGNOSIS — E1140 Type 2 diabetes mellitus with diabetic neuropathy, unspecified: Secondary | ICD-10-CM

## 2012-08-13 DIAGNOSIS — I10 Essential (primary) hypertension: Secondary | ICD-10-CM

## 2012-08-13 DIAGNOSIS — Z0000 Encounter for general adult medical examination without abnormal findings: Secondary | ICD-10-CM

## 2012-08-13 NOTE — Progress Notes (Signed)
Subjective:    Patient ID: Lisa Mann, female    DOB: 1933-04-14, 79 y.o.   MRN: [id number]  HPI Preventive Screening-Counseling & Management   Patient present here today for a Medicare annual wellness visit. Uncontrolled back pain with disc disease is a continuing problem. She was unable to afford epidural after referred due to co pay, since then went to urgent care for same complaint. 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 No  Over the past two weeks, have you felt little interest or pleasure in doing things? No  Have you lost interest or pleasure in daily life? No  Do you often feel hopeless? No  Do you cry easily over simple problems? No   Activities of Daily Living  In your present state of health, do you have any difficulty performing the following activities?  Driving?: No Managing money?: No Feeding yourself?:No Getting from bed to chair?:No Climbing a flight of stairs? yes Preparing food and eating?:No Bathing or showering?:No Getting dressed?:No Getting to the toilet?:No Using the toilet?:No Moving around from place to place?: yes, intermittent low back pain with lower extremity weakness  Fall Risk Assessment In the past year have you fallen or  had a near fall?:yes Are you currently taking any medications that make you dizzy?:No   Hearing Difficulties: No Do you often ask people to speak up or repeat themselves?:No Do you experience ringing or noises in your ears?:No Do you have difficulty understanding soft or whispered voices?:No  Cognitive Testing  Alert? Yes Normal Appearance?Yes  Oriented to person? Yes Place? Yes  Time? Yes  Displays appropriate judgment?Yes  Can read the correct time from a watch face? yes Are you having problems remembering things?No  Advanced Directives have been discussed with the patient?Yes , full code   List the Names of Other Physician/Practitioners you currently use: [facility] for [facility], Dr Noel Gerold , eye, and another eye specialist, urgent care Doc for acute  Visit back pain   Indicate any recent Medical Services you may have received from other than [facility] providers in the past year (date may be approximate).   Assessment:    Annual Wellness Exam   Plan:    During the course of the visit the patient was educated and counseled about appropriate screening and preventive services including:  A healthy diet is rich in fruit, vegetables and whole grains. Poultry fish, nuts and beans are a healthy choice for protein rather then red meat. A low sodium diet and drinking 64 ounces of water daily is generally recommended. Oils and sweet should be limited. Carbohydrates especially for those who are diabetic or overweight, should be limited to 30-45 gram per meal. It is important to eat on a regular schedule, at least  3 times daily. Snacks should be primarily fruits, vegetables or nuts. It is important that you exercise regularly at least 30 minutes 5 times a week. If you develop chest pain, have severe difficulty breathing, or feel very tired, stop exercising immediately and seek medical attention  Immunization reviewed and updated. Cancer screening reviewed and updated    Patient  Instructions (the written plan) was given to the patient.  Medicare Attestation  I have personally reviewed:  The patient's medical and social history  Their use of alcohol, tobacco or illicit drugs  Their current medications and supplements  The patient's functional ability including ADLs,fall risks, home safety risks, cognitive, and hearing and visual impairment  Diet and physical activities  Evidence for depression or mood disorders  The patient's weight, height, BMI, and visual acuity have been recorded in the chart. I have made referrals, counseling, and provided education to the patient based on review of the above and I have provided the patient with a written personalized care plan for preventive services.      Review of Systems     Objective:   Physical Exam        Assessment & Plan:

## 2012-08-13 NOTE — Patient Instructions (Addendum)
F/u in early November, call if you need me before   CBC, fasting lipid, cmp and EGFr, HBA1C, TSH end November before visit   Flu vaccine available early October  Call back if you want information re epidural  pls commit to walking between 20 mins to 

## 2012-08-16 NOTE — Assessment & Plan Note (Addendum)
Annual wellness completed, as documented. Pt is fully functional. Mental health needs to improve, but not overtly depressed. A lot of family burdens being shouldered by her  She is encouraged to commit to regular physical activity and develop an interest of her own Mobility and safety are issues due to back disease with nerve damage, this needs to be further addressed when pt deems able

## 2012-08-26 ENCOUNTER — Other Ambulatory Visit: Payer: Self-pay | Admitting: Family Medicine

## 2012-09-03 DIAGNOSIS — E119 Type 2 diabetes mellitus without complications: Secondary | ICD-10-CM | POA: Diagnosis not present

## 2012-09-03 DIAGNOSIS — E1149 Type 2 diabetes mellitus with other diabetic neurological complication: Secondary | ICD-10-CM | POA: Diagnosis not present

## 2012-10-12 ENCOUNTER — Other Ambulatory Visit: Payer: Self-pay | Admitting: Family Medicine

## 2012-10-19 ENCOUNTER — Other Ambulatory Visit: Payer: Self-pay | Admitting: Family Medicine

## 2012-10-20 DIAGNOSIS — Z23 Encounter for immunization: Secondary | ICD-10-CM | POA: Diagnosis not present

## 2012-10-28 ENCOUNTER — Ambulatory Visit: Payer: Medicare Other | Admitting: Family Medicine

## 2012-11-12 DIAGNOSIS — E785 Hyperlipidemia, unspecified: Secondary | ICD-10-CM | POA: Diagnosis not present

## 2012-11-12 DIAGNOSIS — E1149 Type 2 diabetes mellitus with other diabetic neurological complication: Secondary | ICD-10-CM | POA: Diagnosis not present

## 2012-11-12 DIAGNOSIS — E1142 Type 2 diabetes mellitus with diabetic polyneuropathy: Secondary | ICD-10-CM | POA: Diagnosis not present

## 2012-11-12 DIAGNOSIS — E669 Obesity, unspecified: Secondary | ICD-10-CM | POA: Diagnosis not present

## 2012-11-12 DIAGNOSIS — I10 Essential (primary) hypertension: Secondary | ICD-10-CM | POA: Diagnosis not present

## 2012-11-12 LAB — LIPID PANEL
Cholesterol: 214 mg/dL — ABNORMAL HIGH (ref 0–200)
HDL: 55 mg/dL (ref >39)
LDL Cholesterol: 138 mg/dL — ABNORMAL HIGH (ref 0–99)
Total CHOL/HDL Ratio: 3.9 Ratio
Triglycerides: 105 mg/dL (ref <150)
VLDL: 21 mg/dL (ref 0–40)

## 2012-11-12 LAB — COMPLETE METABOLIC PANEL WITH GFR
ALT: 35 U/L (ref 0–35)
AST: 37 U/L (ref 0–37)
Albumin: 4 g/dL (ref 3.5–5.2)
Alkaline Phosphatase: 43 U/L (ref 39–117)
BUN: 14 mg/dL (ref 6–23)
CO2: 31 mEq/L (ref 19–32)
Calcium: 9.8 mg/dL (ref 8.4–10.5)
Chloride: 99 mEq/L (ref 96–112)
Creat: 0.6 mg/dL (ref 0.50–1.10)
GFR, Est African American: 102 mL/min
GFR, Est Non African American: 88 mL/min
Glucose, Bld: 119 mg/dL — ABNORMAL HIGH (ref 70–99)
Potassium: 4.3 mEq/L (ref 3.5–5.3)
Sodium: 137 mEq/L (ref 135–145)
Total Bilirubin: 0.4 mg/dL (ref 0.3–1.2)
Total Protein: 7 g/dL (ref 6.0–8.3)

## 2012-11-12 LAB — CBC
HCT: 38.5 % (ref 36.0–46.0)
Hemoglobin: 13.3 g/dL (ref 12.0–15.0)
MCH: 29.8 pg (ref 26.0–34.0)
MCHC: 34.5 g/dL (ref 30.0–36.0)
MCV: 86.3 fL (ref 78.0–100.0)
Platelets: 302 10*3/uL (ref 150–400)
RBC: 4.46 MIL/uL (ref 3.87–5.11)
RDW: 15.7 % — ABNORMAL HIGH (ref 11.5–15.5)
WBC: 5.2 10*3/uL (ref 4.0–10.5)

## 2012-11-12 LAB — HEMOGLOBIN A1C
Hgb A1c MFr Bld: 6.8 % — ABNORMAL HIGH (ref <5.7)
Mean Plasma Glucose: 148 mg/dL — ABNORMAL HIGH (ref <117)

## 2012-11-13 LAB — TSH: TSH: 0.544 u[IU]/mL (ref 0.350–4.500)

## 2012-11-18 ENCOUNTER — Ambulatory Visit (INDEPENDENT_AMBULATORY_CARE_PROVIDER_SITE_OTHER): Payer: Medicare Other | Admitting: Family Medicine

## 2012-11-18 ENCOUNTER — Encounter: Payer: Self-pay | Admitting: Family Medicine

## 2012-11-18 ENCOUNTER — Encounter (INDEPENDENT_AMBULATORY_CARE_PROVIDER_SITE_OTHER): Payer: Self-pay

## 2012-11-18 VITALS — BP 138/82 | HR 94 | Resp 16 | Wt 196.1 lb

## 2012-11-18 DIAGNOSIS — F329 Major depressive disorder, single episode, unspecified: Secondary | ICD-10-CM

## 2012-11-18 DIAGNOSIS — E669 Obesity, unspecified: Secondary | ICD-10-CM

## 2012-11-18 DIAGNOSIS — E1149 Type 2 diabetes mellitus with other diabetic neurological complication: Secondary | ICD-10-CM | POA: Diagnosis not present

## 2012-11-18 DIAGNOSIS — I10 Essential (primary) hypertension: Secondary | ICD-10-CM | POA: Diagnosis not present

## 2012-11-18 DIAGNOSIS — M109 Gout, unspecified: Secondary | ICD-10-CM

## 2012-11-18 DIAGNOSIS — Z1211 Encounter for screening for malignant neoplasm of colon: Secondary | ICD-10-CM | POA: Diagnosis not present

## 2012-11-18 DIAGNOSIS — E785 Hyperlipidemia, unspecified: Secondary | ICD-10-CM | POA: Diagnosis not present

## 2012-11-18 DIAGNOSIS — K219 Gastro-esophageal reflux disease without esophagitis: Secondary | ICD-10-CM

## 2012-11-18 DIAGNOSIS — E1142 Type 2 diabetes mellitus with diabetic polyneuropathy: Secondary | ICD-10-CM

## 2012-11-18 DIAGNOSIS — E1140 Type 2 diabetes mellitus with diabetic neuropathy, unspecified: Secondary | ICD-10-CM

## 2012-11-18 DIAGNOSIS — F32A Depression, unspecified: Secondary | ICD-10-CM

## 2012-11-18 DIAGNOSIS — J309 Allergic rhinitis, unspecified: Secondary | ICD-10-CM

## 2012-11-18 LAB — HEMOCCULT GUIAC POC 1CARD (OFFICE): Fecal Occult Blood, POC: NEGATIVE

## 2012-11-18 MED ORDER — RAMIPRIL 5 MG PO CAPS: 5.0000 mg | ORAL_CAPSULE | Freq: Every day | ORAL | Status: DC

## 2012-11-18 MED ORDER — PRAVASTATIN SODIUM 20 MG PO TABS: 20.0000 mg | ORAL_TABLET | Freq: Every evening | ORAL | Status: DC

## 2012-11-18 NOTE — Progress Notes (Signed)
  Subjective:    Patient ID: Lisa Mann, female    DOB: Oct 17, 1933, 79 y.o.   MRN: [id number]  HPI The PT is here for follow up and re-evaluation of chronic medical conditions, medication management and review of any available recent lab and radiology data.  Preventive health is updated, specifically  Cancer screening and Immunization.   Denies polyuria, polydipsia or hypoglycemic episodes. FBG average 120. The PT denies any adverse reactions to current medications since the last visit.  There are no new concerns.  There are no specific complaints       Review of Systems See HPI  Denies recent fever or chills. Denies sinus pressure, nasal congestion, ear pain or sore throat. Denies chest congestion, productive cough or wheezing. Denies chest pains, palpitations and leg swelling Denies abdominal pain, nausea, vomiting,diarrhea or constipation.   Denies dysuria, frequency, hesitancy or incontinence. Denies uncontrolled  joint pain, swelling and limitation in mobility. Denies headaches, seizures, numbness, or tingling. Denies depression, anxiety or insomnia. Denies skin break down or rash.        Objective:   Physical Exam  Patient alert and oriented and in no cardiopulmonary distress.  HEENT: No facial asymmetry, EOMI, no sinus tenderness,  oropharynx pink and moist.  Neck supple no adenopathy.  Chest: Clear to auscultation bilaterally.  CVS: S1, S2 no murmurs, no S3.  ABD: Soft non tender. Bowel sounds normal. Rectal: no mass, heme negative stool Ext: No edema  MS: Adequate ROM spine, shoulders, hips and knees.  Skin: Intact, no ulcerations or rash noted.  Psych: Good eye contact, normal affect. Memory intact not anxious or depressed appearing.  CNS: CN 2-12 intact, power, tone and sensation normal throughout.       Assessment & Plan:

## 2012-11-18 NOTE — Patient Instructions (Signed)
F/u in 3.5 month, call if you need me before  Rectal exam today is normal  INCREASE pravachol to one tablet every night  New for kidney protection is ramipril one tablet every day   Fasting lipid, cmp and EGFR , uric acid and HBA1C in 3.5 month  PLEASE cut back on cake and ice cream, blood sugar is increased and cholesterol is still too high

## 2012-11-19 ENCOUNTER — Other Ambulatory Visit: Payer: Self-pay

## 2012-11-19 DIAGNOSIS — E785 Hyperlipidemia, unspecified: Secondary | ICD-10-CM

## 2012-11-19 DIAGNOSIS — E119 Type 2 diabetes mellitus without complications: Secondary | ICD-10-CM | POA: Diagnosis not present

## 2012-11-19 DIAGNOSIS — E1149 Type 2 diabetes mellitus with other diabetic neurological complication: Secondary | ICD-10-CM | POA: Diagnosis not present

## 2012-11-19 MED ORDER — PRAVASTATIN SODIUM 20 MG PO TABS: 20.0000 mg | ORAL_TABLET | Freq: Every evening | ORAL | Status: DC

## 2012-11-19 MED ORDER — TRIAMTERENE-HCTZ 75-50 MG PO TABS: ORAL_TABLET | ORAL | Status: DC

## 2012-11-19 MED ORDER — OMEPRAZOLE 40 MG PO CPDR: DELAYED_RELEASE_CAPSULE | ORAL | Status: DC

## 2012-11-19 MED ORDER — RISAQUAD PO CAPS: ORAL_CAPSULE | ORAL | Status: DC

## 2012-11-19 MED ORDER — METFORMIN HCL 1000 MG PO TABS: ORAL_TABLET | ORAL | Status: DC

## 2012-11-22 ENCOUNTER — Encounter: Payer: Self-pay | Admitting: Family Medicine

## 2012-11-22 NOTE — Assessment & Plan Note (Signed)
Controlled, no change in medication DASH diet and commitment to daily physical activity for a minimum of 30 minutes discussed and encouraged, as a part of hypertension management. The importance of attaining a healthy weight is also discussed.  

## 2012-11-22 NOTE — Assessment & Plan Note (Signed)
Deteriorated. Patient re-educated about  the importance of commitment to a  minimum of 150 minutes of exercise per week. The importance of healthy food choices with portion control discussed. Encouraged to start a food diary, count calories and to consider  joining a support group. Sample diet sheets offered. Goals set by the patient for the next several months.    

## 2012-11-22 NOTE — Assessment & Plan Note (Signed)
Uncontrolled, increase to daily statin Hyperlipidemia:Low fat diet discussed and encouraged.

## 2012-11-22 NOTE — Assessment & Plan Note (Signed)
Controlled, no change in medication  

## 2012-11-22 NOTE — Assessment & Plan Note (Signed)
Controlled, no change in medication Patient advised to reduce carb and sweets, commit to regular physical activity, take meds as prescribed, test blood as directed, and attempt to lose weight, to improve blood sugar control. Add ACE for renal protection 

## 2012-11-22 NOTE — Assessment & Plan Note (Signed)
Improved , involved in group therapy for widows

## 2012-12-09 DIAGNOSIS — C50919 Malignant neoplasm of unspecified site of unspecified female breast: Secondary | ICD-10-CM | POA: Diagnosis not present

## 2013-01-16 ENCOUNTER — Other Ambulatory Visit: Payer: Self-pay | Admitting: Family Medicine

## 2013-01-28 DIAGNOSIS — E1149 Type 2 diabetes mellitus with other diabetic neurological complication: Secondary | ICD-10-CM | POA: Diagnosis not present

## 2013-01-28 DIAGNOSIS — E119 Type 2 diabetes mellitus without complications: Secondary | ICD-10-CM | POA: Diagnosis not present

## 2013-03-01 ENCOUNTER — Other Ambulatory Visit: Payer: Self-pay | Admitting: Family Medicine

## 2013-03-06 DIAGNOSIS — E1142 Type 2 diabetes mellitus with diabetic polyneuropathy: Secondary | ICD-10-CM | POA: Diagnosis not present

## 2013-03-06 DIAGNOSIS — M109 Gout, unspecified: Secondary | ICD-10-CM | POA: Diagnosis not present

## 2013-03-06 DIAGNOSIS — E785 Hyperlipidemia, unspecified: Secondary | ICD-10-CM | POA: Diagnosis not present

## 2013-03-06 DIAGNOSIS — E1149 Type 2 diabetes mellitus with other diabetic neurological complication: Secondary | ICD-10-CM | POA: Diagnosis not present

## 2013-03-06 LAB — COMPLETE METABOLIC PANEL WITH GFR
ALT: 35 U/L (ref 0–35)
AST: 33 U/L (ref 0–37)
Albumin: 4.4 g/dL (ref 3.5–5.2)
Alkaline Phosphatase: 48 U/L (ref 39–117)
BUN: 15 mg/dL (ref 6–23)
CO2: 31 mEq/L (ref 19–32)
Calcium: 9.8 mg/dL (ref 8.4–10.5)
Chloride: 98 mEq/L (ref 96–112)
Creat: 0.68 mg/dL (ref 0.50–1.10)
GFR, Est African American: >89 mL/min
GFR, Est Non African American: 85 mL/min
Glucose, Bld: 112 mg/dL — ABNORMAL HIGH (ref 70–99)
Potassium: 4.6 mEq/L (ref 3.5–5.3)
Sodium: 138 mEq/L (ref 135–145)
Total Bilirubin: 0.4 mg/dL (ref 0.2–1.2)
Total Protein: 7.5 g/dL (ref 6.0–8.3)

## 2013-03-06 LAB — LIPID PANEL
Cholesterol: 229 mg/dL — ABNORMAL HIGH (ref 0–200)
HDL: 49 mg/dL (ref >39)
LDL Cholesterol: 149 mg/dL — ABNORMAL HIGH (ref 0–99)
Total CHOL/HDL Ratio: 4.7 Ratio
Triglycerides: 153 mg/dL — ABNORMAL HIGH (ref <150)
VLDL: 31 mg/dL (ref 0–40)

## 2013-03-06 LAB — HEMOGLOBIN A1C
Hgb A1c MFr Bld: 6.7 % — ABNORMAL HIGH (ref <5.7)
Mean Plasma Glucose: 146 mg/dL — ABNORMAL HIGH (ref <117)

## 2013-03-06 LAB — URIC ACID: Uric Acid, Serum: 4.1 mg/dL (ref 2.4–7.0)

## 2013-03-10 ENCOUNTER — Encounter (INDEPENDENT_AMBULATORY_CARE_PROVIDER_SITE_OTHER): Payer: Self-pay

## 2013-03-10 ENCOUNTER — Encounter: Payer: Self-pay | Admitting: Family Medicine

## 2013-03-10 ENCOUNTER — Ambulatory Visit (INDEPENDENT_AMBULATORY_CARE_PROVIDER_SITE_OTHER): Payer: Medicare Other | Admitting: Family Medicine

## 2013-03-10 VITALS — BP 118/74 | HR 99 | Resp 16 | Ht 63.5 in | Wt 198.0 lb

## 2013-03-10 DIAGNOSIS — K219 Gastro-esophageal reflux disease without esophagitis: Secondary | ICD-10-CM

## 2013-03-10 DIAGNOSIS — I10 Essential (primary) hypertension: Secondary | ICD-10-CM | POA: Diagnosis not present

## 2013-03-10 DIAGNOSIS — M949 Disorder of cartilage, unspecified: Secondary | ICD-10-CM

## 2013-03-10 DIAGNOSIS — E669 Obesity, unspecified: Secondary | ICD-10-CM

## 2013-03-10 DIAGNOSIS — R5383 Other fatigue: Secondary | ICD-10-CM | POA: Diagnosis not present

## 2013-03-10 DIAGNOSIS — E1149 Type 2 diabetes mellitus with other diabetic neurological complication: Secondary | ICD-10-CM

## 2013-03-10 DIAGNOSIS — E785 Hyperlipidemia, unspecified: Secondary | ICD-10-CM

## 2013-03-10 DIAGNOSIS — F3289 Other specified depressive episodes: Secondary | ICD-10-CM

## 2013-03-10 DIAGNOSIS — M899 Disorder of bone, unspecified: Secondary | ICD-10-CM

## 2013-03-10 DIAGNOSIS — E1142 Type 2 diabetes mellitus with diabetic polyneuropathy: Secondary | ICD-10-CM

## 2013-03-10 DIAGNOSIS — E1140 Type 2 diabetes mellitus with diabetic neuropathy, unspecified: Secondary | ICD-10-CM

## 2013-03-10 DIAGNOSIS — R5381 Other malaise: Secondary | ICD-10-CM

## 2013-03-10 DIAGNOSIS — F32A Depression, unspecified: Secondary | ICD-10-CM

## 2013-03-10 DIAGNOSIS — F329 Major depressive disorder, single episode, unspecified: Secondary | ICD-10-CM

## 2013-03-10 MED ORDER — ATORVASTATIN CALCIUM 10 MG PO TABS: 10.0000 mg | ORAL_TABLET | Freq: Every day | ORAL | Status: DC

## 2013-03-10 NOTE — Patient Instructions (Signed)
F/U in 3.5 months, with foot exam call if you need me before Lab in 3.5 weeks chem 7, and hepatic panel, non fasting   STOP potassium, we will provide a list of potassium rich foods  New for cholesterol is lipitor/atorvastatin every night  Script for meter and once daily testing supplies today t is important that you exercise regularly at least 30 minutes 5 times a week. If you develop chest pain, have severe difficulty breathing, or feel very tired, stop exercising immediately and seek medical attention    A healthy diet is rich in fruit, vegetables and whole grains. Poultry fish, nuts and beans are a healthy choice for protein rather then red meat. A low sodium diet and drinking 64 ounces of water daily is generally recommended. Oils and sweet should be limited. Carbohydrates especially for those who are diabetic or overweight, should be limited to 45 to 60 gram per meal. It is important to eat on a regular schedule, at least 3 times daily. Snacks should be primarily fruits, vegetables or nuts.  Adequate rest, generally 6 to 8 hours per night is important for good health.Good sleep hygiene involves setting a regular bedtime, and turning off all sound and light in your sleep environment.Limiting caffeine intake will also help with the ability to rest well  Lab work needs to be done 3 to 5 days before your follow up visit please.Fasting lipid, cmp and eGFr, hBA1C, vit D, and microalb  All medications need to be brought to every visit

## 2013-03-28 NOTE — Assessment & Plan Note (Signed)
Controlled, no change in medication Patient advised to reduce carb and sweets, commit to regular physical activity, take meds as prescribed, test blood as directed, and attempt to lose weight, to improve blood sugar control.  

## 2013-03-28 NOTE — Assessment & Plan Note (Signed)
Deteriorated. Patient re-educated about  the importance of commitment to a  minimum of 150 minutes of exercise per week. The importance of healthy food choices with portion control discussed. Encouraged to start a food diary, count calories and to consider  joining a support group. Sample diet sheets offered. Goals set by the patient for the next several months.    

## 2013-03-28 NOTE — Assessment & Plan Note (Signed)
Improved, never on mediucation, dedicated effort to start enjoying life again des[pite missing her spouse

## 2013-03-28 NOTE — Assessment & Plan Note (Signed)
Controlled, no change in medication DASH diet and commitment to daily physical activity for a minimum of 30 minutes discussed and encouraged, as a part of hypertension management. The importance of attaining a healthy weight is also discussed.  

## 2013-03-28 NOTE — Assessment & Plan Note (Signed)
Uncontrolled. Start low dose statin with close f/u on liver enzymes. Hyperlipidemia:Low fat diet discussed and encouraged.

## 2013-03-28 NOTE — Assessment & Plan Note (Signed)
Controlled, no change in medication  

## 2013-03-28 NOTE — Progress Notes (Signed)
   Subjective:    Patient ID: Lisa Mann, female    DOB: [birth year], 80 y.o.   MRN: [id number]  HPI The PT is here for follow up and re-evaluation of chronic medical conditions, medication management and review of any available recent lab and radiology data.  Preventive health is updated, specifically  Cancer screening and Immunization.   Denies poyuria , polydipsia, or hypoglycemic episodes. Blood sugar fastingg is seldom as much as 130 The PT denies any adverse reactions to current medications since the last visit.  There are no new concerns.  There are no specific complaints       Review of Systems See HPI Denies recent fever or chills. Denies sinus pressure, nasal congestion, ear pain or sore throat. Denies chest congestion, productive cough or wheezing. Denies chest pains, palpitations and leg swelling Denies abdominal pain, nausea, vomiting,diarrhea or constipation.   Denies dysuria, frequency, hesitancy or incontinence. Denies joint pain, swelling and limitation in mobility. Denies headaches, seizures, numbness, or tingling. Denies depression, uncontrolled anxiety or insomnia. Denies skin break down or rash.        Objective:   Physical Exam  BP 118/74  Pulse 99  Resp 16  Ht 5' 3.5" (1.613 m)  Wt 198 lb (89.812 kg)  BMI 34.52 kg/m2  SpO2 95% Patient alert and oriented and in no cardiopulmonary distress.  HEENT: No facial asymmetry, EOMI, no sinus tenderness,  oropharynx pink and moist.  Neck supple no adenopathy.  Chest: Clear to auscultation bilaterally.  CVS: S1, S2 no murmurs, no S3.  ABD: Soft non tender. Bowel sounds normal.  Ext: No edema  MS: Adequate ROM spine, shoulders, hips and knees.  Skin: Intact, no ulcerations or rash noted.  Psych: Good eye contact, normal affect. Memory intact not anxious or depressed appearing.  CNS: CN 2-12 intact, power, tone and sensation normal throughout.       Assessment & Plan:  HTN, goal below  130/80 Controlled, no change in medication DASH diet and commitment to daily physical activity for a minimum of 30 minutes discussed and encouraged, as a part of hypertension management. The importance of attaining a healthy weight is also discussed.   GERD Controlled, no change in medication   Diabetes mellitus with neuropathy Controlled, no change in medication Patient advised to reduce carb and sweets, commit to regular physical activity, take meds as prescribed, test blood as directed, and attempt to lose weight, to improve blood sugar control.   Depression Improved, never on mediucation, dedicated effort to start enjoying life again des[pite missing her spouse  OBESITY Deteriorated. Patient re-educated about  the importance of commitment to a  minimum of 150 minutes of exercise per week. The importance of healthy food choices with portion control discussed. Encouraged to start a food diary, count calories and to consider  joining a support group. Sample diet sheets offered. Goals set by the patient for the next several months.     Hyperlipidemia LDL goal < 100 Uncontrolled. Start low dose statin with close f/u on liver enzymes. Hyperlipidemia:Low fat diet discussed and encouraged.

## 2013-04-06 DIAGNOSIS — E1149 Type 2 diabetes mellitus with other diabetic neurological complication: Secondary | ICD-10-CM | POA: Diagnosis not present

## 2013-04-06 DIAGNOSIS — E785 Hyperlipidemia, unspecified: Secondary | ICD-10-CM | POA: Diagnosis not present

## 2013-04-06 DIAGNOSIS — E1142 Type 2 diabetes mellitus with diabetic polyneuropathy: Secondary | ICD-10-CM | POA: Diagnosis not present

## 2013-04-06 LAB — HEPATIC FUNCTION PANEL
ALT: 24 U/L (ref 0–35)
AST: 24 U/L (ref 0–37)
Albumin: 4.1 g/dL (ref 3.5–5.2)
Alkaline Phosphatase: 45 U/L (ref 39–117)
Bilirubin, Direct: 0.1 mg/dL (ref 0.0–0.3)
Indirect Bilirubin: 0.3 mg/dL (ref 0.2–1.2)
Total Bilirubin: 0.4 mg/dL (ref 0.2–1.2)
Total Protein: 7 g/dL (ref 6.0–8.3)

## 2013-04-06 LAB — BASIC METABOLIC PANEL
BUN: 18 mg/dL (ref 6–23)
CO2: 29 mEq/L (ref 19–32)
Calcium: 9.8 mg/dL (ref 8.4–10.5)
Chloride: 99 mEq/L (ref 96–112)
Creat: 0.72 mg/dL (ref 0.50–1.10)
Glucose, Bld: 114 mg/dL — ABNORMAL HIGH (ref 70–99)
Potassium: 4.5 mEq/L (ref 3.5–5.3)
Sodium: 138 mEq/L (ref 135–145)

## 2013-04-15 DIAGNOSIS — E1149 Type 2 diabetes mellitus with other diabetic neurological complication: Secondary | ICD-10-CM | POA: Diagnosis not present

## 2013-04-15 DIAGNOSIS — E119 Type 2 diabetes mellitus without complications: Secondary | ICD-10-CM | POA: Diagnosis not present

## 2013-05-29 ENCOUNTER — Other Ambulatory Visit: Payer: Self-pay | Admitting: Family Medicine

## 2013-06-11 DIAGNOSIS — C50919 Malignant neoplasm of unspecified site of unspecified female breast: Secondary | ICD-10-CM | POA: Diagnosis not present

## 2013-06-11 DIAGNOSIS — I10 Essential (primary) hypertension: Secondary | ICD-10-CM | POA: Diagnosis not present

## 2013-06-11 DIAGNOSIS — E119 Type 2 diabetes mellitus without complications: Secondary | ICD-10-CM | POA: Diagnosis not present

## 2013-06-11 DIAGNOSIS — Z853 Personal history of malignant neoplasm of breast: Secondary | ICD-10-CM | POA: Diagnosis not present

## 2013-06-11 DIAGNOSIS — Z4789 Encounter for other orthopedic aftercare: Secondary | ICD-10-CM | POA: Diagnosis not present

## 2013-06-24 DIAGNOSIS — E119 Type 2 diabetes mellitus without complications: Secondary | ICD-10-CM | POA: Diagnosis not present

## 2013-06-24 DIAGNOSIS — E1149 Type 2 diabetes mellitus with other diabetic neurological complication: Secondary | ICD-10-CM | POA: Diagnosis not present

## 2013-07-12 ENCOUNTER — Other Ambulatory Visit: Payer: Self-pay | Admitting: Family Medicine

## 2013-07-14 DIAGNOSIS — E785 Hyperlipidemia, unspecified: Secondary | ICD-10-CM | POA: Diagnosis not present

## 2013-07-14 DIAGNOSIS — E1142 Type 2 diabetes mellitus with diabetic polyneuropathy: Secondary | ICD-10-CM | POA: Diagnosis not present

## 2013-07-14 DIAGNOSIS — M899 Disorder of bone, unspecified: Secondary | ICD-10-CM | POA: Diagnosis not present

## 2013-07-14 DIAGNOSIS — E1149 Type 2 diabetes mellitus with other diabetic neurological complication: Secondary | ICD-10-CM | POA: Diagnosis not present

## 2013-07-14 LAB — LIPID PANEL
Cholesterol: 158 mg/dL (ref 0–200)
HDL: 44 mg/dL (ref >39)
LDL Cholesterol: 101 mg/dL — ABNORMAL HIGH (ref 0–99)
Total CHOL/HDL Ratio: 3.6 Ratio
Triglycerides: 67 mg/dL (ref <150)
VLDL: 13 mg/dL (ref 0–40)

## 2013-07-14 LAB — COMPLETE METABOLIC PANEL WITH GFR
ALT: 25 U/L (ref 0–35)
AST: 24 U/L (ref 0–37)
Albumin: 4.1 g/dL (ref 3.5–5.2)
Alkaline Phosphatase: 50 U/L (ref 39–117)
BUN: 16 mg/dL (ref 6–23)
CO2: 28 mEq/L (ref 19–32)
Calcium: 9.8 mg/dL (ref 8.4–10.5)
Chloride: 100 mEq/L (ref 96–112)
Creat: 0.68 mg/dL (ref 0.50–1.10)
GFR, Est African American: >89 mL/min
GFR, Est Non African American: 85 mL/min
Glucose, Bld: 117 mg/dL — ABNORMAL HIGH (ref 70–99)
Potassium: 4.2 mEq/L (ref 3.5–5.3)
Sodium: 138 mEq/L (ref 135–145)
Total Bilirubin: 0.4 mg/dL (ref 0.2–1.2)
Total Protein: 7.1 g/dL (ref 6.0–8.3)

## 2013-07-14 LAB — HEMOGLOBIN A1C
Hgb A1c MFr Bld: 6.6 % — ABNORMAL HIGH (ref <5.7)
Mean Plasma Glucose: 143 mg/dL — ABNORMAL HIGH (ref <117)

## 2013-07-15 LAB — VITAMIN D 25 HYDROXY (VIT D DEFICIENCY, FRACTURES): Vit D, 25-Hydroxy: 38 ng/mL (ref 30–89)

## 2013-07-15 LAB — MICROALBUMIN / CREATININE URINE RATIO
Creatinine, Urine: 209.6 mg/dL
Microalb Creat Ratio: 4.6 mg/g (ref 0.0–30.0)
Microalb, Ur: 0.96 mg/dL (ref 0.00–1.89)

## 2013-07-20 ENCOUNTER — Encounter: Payer: Self-pay | Admitting: Family Medicine

## 2013-07-20 ENCOUNTER — Encounter (INDEPENDENT_AMBULATORY_CARE_PROVIDER_SITE_OTHER): Payer: Self-pay

## 2013-07-20 ENCOUNTER — Ambulatory Visit (INDEPENDENT_AMBULATORY_CARE_PROVIDER_SITE_OTHER): Payer: Medicare Other | Admitting: Family Medicine

## 2013-07-20 VITALS — BP 140/82 | HR 97 | Resp 16 | Wt 194.1 lb

## 2013-07-20 DIAGNOSIS — E1140 Type 2 diabetes mellitus with diabetic neuropathy, unspecified: Secondary | ICD-10-CM

## 2013-07-20 DIAGNOSIS — K219 Gastro-esophageal reflux disease without esophagitis: Secondary | ICD-10-CM

## 2013-07-20 DIAGNOSIS — E1149 Type 2 diabetes mellitus with other diabetic neurological complication: Secondary | ICD-10-CM

## 2013-07-20 DIAGNOSIS — E785 Hyperlipidemia, unspecified: Secondary | ICD-10-CM

## 2013-07-20 DIAGNOSIS — E1142 Type 2 diabetes mellitus with diabetic polyneuropathy: Secondary | ICD-10-CM

## 2013-07-20 DIAGNOSIS — J309 Allergic rhinitis, unspecified: Secondary | ICD-10-CM

## 2013-07-20 DIAGNOSIS — M109 Gout, unspecified: Secondary | ICD-10-CM

## 2013-07-20 DIAGNOSIS — M533 Sacrococcygeal disorders, not elsewhere classified: Secondary | ICD-10-CM

## 2013-07-20 DIAGNOSIS — Z23 Encounter for immunization: Secondary | ICD-10-CM | POA: Diagnosis not present

## 2013-07-20 DIAGNOSIS — I10 Essential (primary) hypertension: Secondary | ICD-10-CM | POA: Diagnosis not present

## 2013-07-20 MED ORDER — TRIAMTERENE-HCTZ 75-50 MG PO TABS: ORAL_TABLET | ORAL | Status: DC

## 2013-07-20 MED ORDER — ALLOPURINOL 300 MG PO TABS: ORAL_TABLET | ORAL | Status: DC

## 2013-07-20 MED ORDER — METFORMIN HCL 1000 MG PO TABS: ORAL_TABLET | ORAL | Status: DC

## 2013-07-20 MED ORDER — ATORVASTATIN CALCIUM 10 MG PO TABS: 10.0000 mg | ORAL_TABLET | Freq: Every day | ORAL | Status: DC

## 2013-07-20 MED ORDER — OMEPRAZOLE 40 MG PO CPDR: DELAYED_RELEASE_CAPSULE | ORAL | Status: DC

## 2013-07-20 MED ORDER — RAMIPRIL 10 MG PO CAPS: 10.0000 mg | ORAL_CAPSULE | Freq: Every day | ORAL | Status: DC

## 2013-07-20 NOTE — Progress Notes (Signed)
   Subjective:    Patient ID: Lisa Mann, female    DOB: 1933/12/30, 80 y.o.   MRN: [id number]  HPI The PT is here for follow up and re-evaluation of chronic medical conditions, medication management and review of any available recent lab and radiology data.  Preventive health is updated, specifically  Cancer screening and Immunization.    The PT denies any adverse reactions to current medications since the last visit. Unfortunately , she has been out of 2 of her BP meds for past several weeks There are no new concerns.  Recent flare of back pain controlled with potassium. Increased family stress and death of her son in law 3 months ago, coping fairly well involve din support grp in [place] which she enjoys      Review of Systems See HPI   Denies recent fever or chills. Denies sinus pressure, nasal congestion, ear pain or sore throat. Denies chest congestion, productive cough or wheezing. Denies chest pains, palpitations and leg swelling Denies abdominal pain, nausea, vomiting,diarrhea or constipation.   Denies dysuria, frequency, hesitancy or incontinence.  Denies headaches, seizures, numbness, or tingling. Denies depression, anxiety or insomnia. Denies skin break down or rash.        Objective:   Physical Exam BP 140/82  Pulse 97  Resp 16  Wt 194 lb 1.9 oz (88.052 kg)  SpO2 96% Patient alert and oriented and in no cardiopulmonary distress.  HEENT: No facial asymmetry, EOMI,   oropharynx pink and moist.  Neck supple no JVD, no mass.  Chest: Clear to auscultation bilaterally.  CVS: S1, S2 no murmurs, no S3.Regular rate.  ABD: Soft non tender.   Ext: No edema  MS: Adequate ROM spine, shoulders, hips and knees.  Skin: Intact, no ulcerations or rash noted.  Psych: Good eye contact, normal affect. Memory intact not anxious or depressed appearing.  CNS: CN 2-12 intact, power,  normal throughout.no focal deficits noted.        Assessment & Plan:    HTN, goal below 130/80 Uncontrolled, inc dose of altace, stop amlodipine and metoprolol, which has been off for several weeks  DASH diet and commitment to daily physical activity for a minimum of 30 minutes discussed and encouraged, as a part of hypertension management. The importance of attaining a healthy weight is also discussed.   Diabetes mellitus with neuropathy Controlled, no change in medication Patient advised to reduce carb and sweets, commit to regular physical activity, take meds as prescribed, test blood as directed, and attempt to lose weight, to improve blood sugar control. Foot exam reveals calluses and signiuficant neuropathy bilaterally,mainly from chemotherapy for breast cancer  GOUT, UNSPECIFIED Controlled, no recent flare need to check uric acid level  Back pain, sacroiliac Reports recent unprovoked flare which responded to daily potassium which she has since continued  GERD Controlled, no change in medication   ALLERGIC RHINITIS CAUSE UNSPECIFIED Controlled, no change in medication   Hyperlipidemia with target LDL less than 100 Nearly at goal Hyperlipidemia:Low fat diet discussed and encouraged.  No med change

## 2013-07-20 NOTE — Assessment & Plan Note (Signed)
Controlled, no change in medication Patient advised to reduce carb and sweets, commit to regular physical activity, take meds as prescribed, test blood as directed, and attempt to lose weight, to improve blood sugar control. Foot exam reveals calluses and signiuficant neuropathy bilaterally,mainly from chemotherapy for breast cancer

## 2013-07-20 NOTE — Assessment & Plan Note (Signed)
Reports recent unprovoked flare which responded to daily potassium which she has since continued

## 2013-07-20 NOTE — Assessment & Plan Note (Signed)
Uncontrolled, inc dose of altace, stop amlodipine and metoprolol, which has been off for several weeks  DASH diet and commitment to daily physical activity for a minimum of 30 minutes discussed and encouraged, as a part of hypertension management. The importance of attaining a healthy weight is also discussed.

## 2013-07-20 NOTE — Assessment & Plan Note (Signed)
Controlled, no change in medication  

## 2013-07-20 NOTE — Assessment & Plan Note (Signed)
Nearly at goal Hyperlipidemia:Low fat diet discussed and encouraged.  No med change

## 2013-07-20 NOTE — Assessment & Plan Note (Signed)
Controlled, no recent flare need to check uric acid level

## 2013-07-20 NOTE — Patient Instructions (Addendum)
Annual physical exam in 6 to 8 weeks, call if you need me before  You will get prevnar and script for diabetic shoes today  Pls reduce fried and fatty foods, cholesterol improved but still slightly high  BP is high, NEW is ALTACE 10mg  one daily,( stop metoprolol and amlodipine which you have not been on for a while)  OK to take TWO 5mg  altace tablets till done

## 2013-08-10 ENCOUNTER — Other Ambulatory Visit: Payer: Self-pay | Admitting: Family Medicine

## 2013-08-27 ENCOUNTER — Other Ambulatory Visit: Payer: Self-pay | Admitting: Family Medicine

## 2013-08-29 ENCOUNTER — Other Ambulatory Visit: Payer: Self-pay | Admitting: Family Medicine

## 2013-09-06 DIAGNOSIS — E119 Type 2 diabetes mellitus without complications: Secondary | ICD-10-CM | POA: Diagnosis not present

## 2013-09-06 DIAGNOSIS — M79609 Pain in unspecified limb: Secondary | ICD-10-CM | POA: Diagnosis not present

## 2013-09-06 DIAGNOSIS — B351 Tinea unguium: Secondary | ICD-10-CM | POA: Diagnosis not present

## 2013-09-06 DIAGNOSIS — L609 Nail disorder, unspecified: Secondary | ICD-10-CM | POA: Diagnosis not present

## 2013-09-06 DIAGNOSIS — L851 Acquired keratosis [keratoderma] palmaris et plantaris: Secondary | ICD-10-CM | POA: Diagnosis not present

## 2013-09-07 ENCOUNTER — Ambulatory Visit (INDEPENDENT_AMBULATORY_CARE_PROVIDER_SITE_OTHER): Payer: Medicare Other | Admitting: Family Medicine

## 2013-09-07 ENCOUNTER — Encounter (INDEPENDENT_AMBULATORY_CARE_PROVIDER_SITE_OTHER): Payer: Self-pay

## 2013-09-07 ENCOUNTER — Encounter: Payer: Self-pay | Admitting: Family Medicine

## 2013-09-07 VITALS — BP 128/78 | HR 98 | Resp 16 | Ht 63.5 in | Wt 195.1 lb

## 2013-09-07 DIAGNOSIS — Z0000 Encounter for general adult medical examination without abnormal findings: Secondary | ICD-10-CM | POA: Insufficient documentation

## 2013-09-07 DIAGNOSIS — C50911 Malignant neoplasm of unspecified site of right female breast: Secondary | ICD-10-CM

## 2013-09-07 DIAGNOSIS — Z23 Encounter for immunization: Secondary | ICD-10-CM | POA: Diagnosis not present

## 2013-09-07 DIAGNOSIS — I10 Essential (primary) hypertension: Secondary | ICD-10-CM

## 2013-09-07 DIAGNOSIS — Z1272 Encounter for screening for malignant neoplasm of vagina: Secondary | ICD-10-CM

## 2013-09-07 DIAGNOSIS — E785 Hyperlipidemia, unspecified: Secondary | ICD-10-CM

## 2013-09-07 DIAGNOSIS — R5383 Other fatigue: Secondary | ICD-10-CM

## 2013-09-07 DIAGNOSIS — Z1211 Encounter for screening for malignant neoplasm of colon: Secondary | ICD-10-CM

## 2013-09-07 DIAGNOSIS — C50912 Malignant neoplasm of unspecified site of left female breast: Secondary | ICD-10-CM

## 2013-09-07 DIAGNOSIS — E1340 Other specified diabetes mellitus with diabetic neuropathy, unspecified: Secondary | ICD-10-CM

## 2013-09-07 DIAGNOSIS — R5381 Other malaise: Secondary | ICD-10-CM

## 2013-09-07 DIAGNOSIS — Z1239 Encounter for other screening for malignant neoplasm of breast: Secondary | ICD-10-CM

## 2013-09-07 LAB — POC HEMOCCULT BLD/STL (OFFICE/1-CARD/DIAGNOSTIC): Fecal Occult Blood, POC: NEGATIVE

## 2013-09-07 MED ORDER — POTASSIUM CHLORIDE CRYS ER 20 MEQ PO TBCR: EXTENDED_RELEASE_TABLET | ORAL | Status: DC

## 2013-09-07 NOTE — Patient Instructions (Addendum)
F/U  end November, call if you need me before   Fasting lipid, cmp and EGFr, HBA1C, CBC, and TSH in end Nov   Flu vaccine today  You are referred to cancer center at [facility] per our discussion  Reduce bread and sweets    Pls start regular exercise, group sessions are generally fun!  Blood pressure and exam today are good

## 2013-09-07 NOTE — Assessment & Plan Note (Signed)
Vaccine administered at visit.  

## 2013-09-07 NOTE — Assessment & Plan Note (Signed)
Annual exam as documented. Counseling done  re healthy lifestyle involving commitment to 150 minutes exercise per week, heart healthy diet, and attaining healthy weight.The importance of adequate sleep also discussed. Regular seat belt use and safe storage  of firearms if patient has them, is also discussed. Changes in health habits are decided on by the patient with goals and time frames  set for achieving them. Immunization and cancer screening needs are specifically addressed at this visit.  

## 2013-09-07 NOTE — Assessment & Plan Note (Signed)
Pt was treated at [facility], and is in remission.She has bilateral mastectomy. Wants to transfer her care locally due to geography, she will notify [facility] today, follow up she believes is due arounfd December

## 2013-09-12 NOTE — Progress Notes (Signed)
   Subjective:    Patient ID: Lisa Mann, female    DOB: 1933-04-21, 80 y.o.   MRN: [id number]  HPI Patient is in for pelvic and breast exam. No other health concerns are expressed or addressed at the visit. Flu vaccine, which is currently due, is administered   Review of Systems See HPI     Objective:   Physical Exam  BP 128/78  Pulse 98  Resp 16  Ht 5' 3.5" (1.613 m)  Wt 195 lb 1.9 oz (88.506 kg)  BMI 34.02 kg/m2  SpO2 96%  Pleasant well nourished female, alert and oriented x 3, in no cardio-pulmonary distress. Afebrile.   Breast: Bilateral mastectomy,no masses or lumps. No tenderness. Scars are well healed. No axillary or supraclavicular adenopathy     Rectal:  Normal sphincter tone. No mass.No rectal masses.  Guaiac negative stool.  GU: External genitalia normal female genitalia , female distribution of hair. No lesions. Urethral meatus normal in size, no  Prolapse, no lesions visibly  Present. Bladder non tender. Vagina pink and moist , with no visible lesions , discharge present . Adequate pelvic support no  cystocele or rectocele noted  Uterus absent, no adnexal masses, no  adnexal tenderness.        Assessment & Plan:  Annual physical exam Annual exam as documented. Counseling done  re healthy lifestyle involving commitment to 150 minutes exercise per week, heart healthy diet, and attaining healthy weight.The importance of adequate sleep also discussed. Regular seat belt use and safe storage  of firearms if patient has them, is also discussed. Changes in health habits are decided on by the patient with goals and time frames  set for achieving them. Immunization and cancer screening needs are specifically addressed at this visit.    Bilateral breast cancer Pt was treated at [facility], and is in remission.She has bilateral mastectomy. Wants to transfer her care locally due to geography, she will notify [facility] today, follow up she believes is due  arounfd December  Need for prophylactic vaccination and inoculation against influenza Vaccine administered at visit.

## 2013-09-24 DIAGNOSIS — E119 Type 2 diabetes mellitus without complications: Secondary | ICD-10-CM | POA: Diagnosis not present

## 2013-09-24 DIAGNOSIS — H2510 Age-related nuclear cataract, unspecified eye: Secondary | ICD-10-CM | POA: Diagnosis not present

## 2013-09-24 DIAGNOSIS — H18519 Endothelial corneal dystrophy, unspecified eye: Secondary | ICD-10-CM | POA: Diagnosis not present

## 2013-09-24 LAB — HM DIABETES EYE EXAM

## 2013-10-08 ENCOUNTER — Ambulatory Visit (HOSPITAL_COMMUNITY): Payer: PRIVATE HEALTH INSURANCE

## 2013-10-24 ENCOUNTER — Other Ambulatory Visit: Payer: Self-pay | Admitting: Family Medicine
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
## 2013-11-10 ENCOUNTER — Other Ambulatory Visit: Payer: Self-pay | Admitting: Family Medicine

## 2013-11-15 DIAGNOSIS — M79671 Pain in right foot: Secondary | ICD-10-CM | POA: Diagnosis not present

## 2013-11-15 DIAGNOSIS — L851 Acquired keratosis [keratoderma] palmaris et plantaris: Secondary | ICD-10-CM | POA: Diagnosis not present

## 2013-11-15 DIAGNOSIS — B351 Tinea unguium: Secondary | ICD-10-CM | POA: Diagnosis not present

## 2013-11-15 DIAGNOSIS — E1342 Other specified diabetes mellitus with diabetic polyneuropathy: Secondary | ICD-10-CM | POA: Diagnosis not present

## 2013-11-15 DIAGNOSIS — L609 Nail disorder, unspecified: Secondary | ICD-10-CM | POA: Diagnosis not present

## 2013-11-22 ENCOUNTER — Encounter (HOSPITAL_COMMUNITY): Payer: Self-pay

## 2013-11-22 ENCOUNTER — Encounter (HOSPITAL_COMMUNITY): Payer: Medicare Other | Attending: Hematology and Oncology

## 2013-11-22 VITALS — BP 120/62 | HR 67 | Temp 97.9°F | Resp 18 | Ht 63.75 in | Wt 190.7 lb

## 2013-11-22 DIAGNOSIS — E785 Hyperlipidemia, unspecified: Secondary | ICD-10-CM | POA: Diagnosis not present

## 2013-11-22 DIAGNOSIS — I10 Essential (primary) hypertension: Secondary | ICD-10-CM

## 2013-11-22 DIAGNOSIS — A6000 Herpesviral infection of urogenital system, unspecified: Secondary | ICD-10-CM | POA: Diagnosis not present

## 2013-11-22 DIAGNOSIS — Z853 Personal history of malignant neoplasm of breast: Secondary | ICD-10-CM

## 2013-11-22 DIAGNOSIS — E119 Type 2 diabetes mellitus without complications: Secondary | ICD-10-CM | POA: Diagnosis not present

## 2013-11-22 DIAGNOSIS — M109 Gout, unspecified: Secondary | ICD-10-CM

## 2013-11-22 DIAGNOSIS — C50912 Malignant neoplasm of unspecified site of left female breast: Secondary | ICD-10-CM | POA: Insufficient documentation

## 2013-11-22 DIAGNOSIS — C50911 Malignant neoplasm of unspecified site of right female breast: Secondary | ICD-10-CM | POA: Diagnosis not present

## 2013-11-22 LAB — CBC WITH DIFFERENTIAL/PLATELET
Basophils Absolute: 0 10*3/uL (ref 0.0–0.1)
Basophils Relative: 0 % (ref 0–1)
Eosinophils Absolute: 0.1 10*3/uL (ref 0.0–0.7)
Eosinophils Relative: 2 % (ref 0–5)
HCT: 41.1 % (ref 36.0–46.0)
Hemoglobin: 13.9 g/dL (ref 12.0–15.0)
Lymphocytes Relative: 39 % (ref 12–46)
Lymphs Abs: 2 10*3/uL (ref 0.7–4.0)
MCH: 30 pg (ref 26.0–34.0)
MCHC: 33.8 g/dL (ref 30.0–36.0)
MCV: 88.6 fL (ref 78.0–100.0)
Monocytes Absolute: 0.3 10*3/uL (ref 0.1–1.0)
Monocytes Relative: 7 % (ref 3–12)
Neutro Abs: 2.6 10*3/uL (ref 1.7–7.7)
Neutrophils Relative %: 52 % (ref 43–77)
Platelets: 349 10*3/uL (ref 150–400)
RBC: 4.64 MIL/uL (ref 3.87–5.11)
RDW: 15.4 % (ref 11.5–15.5)
WBC: 5.1 10*3/uL (ref 4.0–10.5)

## 2013-11-22 LAB — COMPREHENSIVE METABOLIC PANEL
ALT: 38 U/L — ABNORMAL HIGH (ref 0–35)
AST: 30 U/L (ref 0–37)
Albumin: 4.2 g/dL (ref 3.5–5.2)
Alkaline Phosphatase: 62 U/L (ref 39–117)
Anion gap: 12 (ref 5–15)
BUN: 15 mg/dL (ref 6–23)
CO2: 30 mEq/L (ref 19–32)
Calcium: 10.1 mg/dL (ref 8.4–10.5)
Chloride: 96 mEq/L (ref 96–112)
Creatinine, Ser: 0.7 mg/dL (ref 0.50–1.10)
GFR calc Af Amer: >90 mL/min (ref >90)
GFR calc non Af Amer: 81 mL/min — ABNORMAL LOW (ref >90)
Glucose, Bld: 93 mg/dL (ref 70–99)
Potassium: 3.9 mEq/L (ref 3.7–5.3)
Sodium: 138 mEq/L (ref 137–147)
Total Bilirubin: 0.2 mg/dL — ABNORMAL LOW (ref 0.3–1.2)
Total Protein: 8 g/dL (ref 6.0–8.3)

## 2013-11-22 NOTE — Patient Instructions (Signed)
[place] Discharge Instructions  RECOMMENDATIONS MADE BY THE CONSULTANT AND ANY TEST RESULTS WILL BE SENT TO YOUR REFERRING PHYSICIAN.  EXAM FINDINGS BY THE PHYSICIAN TODAY AND SIGNS OR SYMPTOMS TO REPORT TO CLINIC OR PRIMARY PHYSICIAN: Exam and findings as discussed by Dr. Barnet Glasgow.  Will check labs today and if there is anything of concern we will contact you. Report any new lumps, bone pain, shortness of breath or other symptoms.    INSTRUCTIONS/FOLLOW-UP: Follow-up in 1 year with labs and office visit.  Thank you for choosing [place] to provide your oncology and hematology care.  To afford each patient quality time with our providers, please arrive at least 15 minutes before your scheduled appointment time.  With your help, our goal is to use those 15 minutes to complete the necessary work-up to ensure our physicians have the information they need to help with your evaluation and healthcare recommendations.    Effective January 1st, 2014, we ask that you re-schedule your appointment with our physicians should you arrive 10 or more minutes late for your appointment.  We strive to give you quality time with our providers, and arriving late affects you and other patients whose appointments are after yours.    Again, thank you for choosing [facility].  Our hope is that these requests will decrease the amount of time that you wait before being seen by our physicians.       _____________________________________________________________  Should you have questions after your visit to [facility], please contact our office at [phone] between the hours of 8:30 a.m. and 4:30 p.m.  Voicemails left after 4:30 p.m. will not be returned until the following business day.  For prescription refill requests, have your pharmacy contact our office with your prescription refill request.     _______________________________________________________________  We hope that we have given you very good care.  You may receive a patient satisfaction survey in the mail, please complete it and return it as soon as possible.  We value your feedback!  _______________________________________________________________  Have you asked about our STAR program?  STAR stands for Survivorship Training and Rehabilitation, and this is a nationally recognized cancer care program that focuses on survivorship and rehabilitation.  Cancer and cancer treatments may cause problems, such as, pain, making you feel tired and keeping you from doing the things that you need or want to do. Cancer rehabilitation can help. Our goal is to reduce these troubling effects and help you have the best quality of life possible.  You may receive a survey from a nurse that asks questions about your current state of health.  Based on the survey results, all eligible patients will be referred to the [facility] program for an evaluation so we can better serve you!  A frequently asked questions sheet is available upon request.

## 2013-11-22 NOTE — Progress Notes (Signed)
Lisa Mann presented for labwork. Labs per MD order drawn via Peripheral Line 23 gauge needle inserted in left AC  Good blood return present. Procedure without incident.  Needle removed intact. Patient tolerated procedure well.

## 2013-11-23 LAB — VITAMIN D 25 HYDROXY (VIT D DEFICIENCY, FRACTURES): Vit D, 25-Hydroxy: 13 ng/mL — ABNORMAL LOW (ref 30–100)

## 2013-11-23 LAB — CANCER ANTIGEN 27.29: CA 27.29: 10 U/mL (ref 0–39)

## 2013-11-23 LAB — CEA: CEA: 1.3 ng/mL (ref 0.0–5.0)

## 2013-12-06 ENCOUNTER — Encounter: Payer: Self-pay | Admitting: Family Medicine

## 2013-12-06 ENCOUNTER — Ambulatory Visit (INDEPENDENT_AMBULATORY_CARE_PROVIDER_SITE_OTHER): Payer: Medicare Other | Admitting: Family Medicine

## 2013-12-06 VITALS — BP 120/78 | HR 71 | Resp 16 | Ht 64.0 in | Wt 190.1 lb

## 2013-12-06 DIAGNOSIS — E785 Hyperlipidemia, unspecified: Secondary | ICD-10-CM

## 2013-12-06 DIAGNOSIS — I10 Essential (primary) hypertension: Secondary | ICD-10-CM | POA: Diagnosis not present

## 2013-12-06 DIAGNOSIS — E669 Obesity, unspecified: Secondary | ICD-10-CM

## 2013-12-06 DIAGNOSIS — E1340 Other specified diabetes mellitus with diabetic neuropathy, unspecified: Secondary | ICD-10-CM | POA: Diagnosis not present

## 2013-12-06 LAB — CBC WITH DIFFERENTIAL/PLATELET
Basophils Absolute: 0 10*3/uL (ref 0.0–0.1)
Basophils Relative: 0 % (ref 0–1)
Eosinophils Absolute: 0.1 10*3/uL (ref 0.0–0.7)
Eosinophils Relative: 3 % (ref 0–5)
HCT: 36.8 % (ref 36.0–46.0)
Hemoglobin: 12.9 g/dL (ref 12.0–15.0)
Lymphocytes Relative: 33 % (ref 12–46)
Lymphs Abs: 1.5 10*3/uL (ref 0.7–4.0)
MCH: 29.6 pg (ref 26.0–34.0)
MCHC: 35.1 g/dL (ref 30.0–36.0)
MCV: 84.4 fL (ref 78.0–100.0)
Monocytes Absolute: 0.5 10*3/uL (ref 0.1–1.0)
Monocytes Relative: 11 % (ref 3–12)
Neutro Abs: 2.4 10*3/uL (ref 1.7–7.7)
Neutrophils Relative %: 53 % (ref 43–77)
Platelets: 295 10*3/uL (ref 150–400)
RBC: 4.36 MIL/uL (ref 3.87–5.11)
RDW: 15.9 % — ABNORMAL HIGH (ref 11.5–15.5)
WBC: 4.5 10*3/uL (ref 4.0–10.5)

## 2013-12-06 LAB — COMPLETE METABOLIC PANEL WITH GFR
ALT: 20 U/L (ref 0–35)
AST: 19 U/L (ref 0–37)
Albumin: 4.1 g/dL (ref 3.5–5.2)
Alkaline Phosphatase: 43 U/L (ref 39–117)
BUN: 16 mg/dL (ref 6–23)
CO2: 29 mEq/L (ref 19–32)
Calcium: 9.7 mg/dL (ref 8.4–10.5)
Chloride: 99 mEq/L (ref 96–112)
Creat: 0.61 mg/dL (ref 0.50–1.10)
GFR, Est African American: >89 mL/min
GFR, Est Non African American: 87 mL/min
Glucose, Bld: 105 mg/dL — ABNORMAL HIGH (ref 70–99)
Potassium: 5 mEq/L (ref 3.5–5.3)
Sodium: 140 mEq/L (ref 135–145)
Total Bilirubin: 0.4 mg/dL (ref 0.2–1.2)
Total Protein: 7 g/dL (ref 6.0–8.3)

## 2013-12-06 LAB — LIPID PANEL
Cholesterol: 138 mg/dL (ref 0–200)
HDL: 42 mg/dL (ref >39)
LDL Cholesterol: 84 mg/dL (ref 0–99)
Total CHOL/HDL Ratio: 3.3 Ratio
Triglycerides: 59 mg/dL (ref <150)
VLDL: 12 mg/dL (ref 0–40)

## 2013-12-06 LAB — TSH: TSH: 0.35 u[IU]/mL (ref 0.350–4.500)

## 2013-12-06 MED ORDER — RAMIPRIL 10 MG PO CAPS: 10.0000 mg | ORAL_CAPSULE | Freq: Every day | ORAL | Status: DC

## 2013-12-06 MED ORDER — METFORMIN HCL 1000 MG PO TABS: ORAL_TABLET | ORAL | Status: DC

## 2013-12-06 MED ORDER — ATORVASTATIN CALCIUM 10 MG PO TABS: 10.0000 mg | ORAL_TABLET | Freq: Every day | ORAL | Status: DC

## 2013-12-06 MED ORDER — ALLOPURINOL 300 MG PO TABS: ORAL_TABLET | ORAL | Status: DC

## 2013-12-06 NOTE — Assessment & Plan Note (Signed)
Deteriorated. Patient re-educated about  the importance of commitment to a  minimum of 150 minutes of exercise per week. The importance of healthy food choices with portion control discussed. Encouraged to start a food diary, count calories and to consider  joining a support group. Sample diet sheets offered. Goals set by the patient for the next several months.    

## 2013-12-06 NOTE — Assessment & Plan Note (Signed)
Updated lab needed at/ before next visit. Patient advised to reduce carb and sweets, commit to regular physical activity, take meds as prescribed, test blood as directed, and attempt to lose weight, to improve blood sugar control.  

## 2013-12-06 NOTE — Assessment & Plan Note (Signed)
Controlled, no change in medication Hyperlipidemia:Low fat diet discussed and encouraged.  \ 

## 2013-12-06 NOTE — Patient Instructions (Addendum)
Annual wellness in 4 month, call if you need me before  Please sign for recent eye exam \ Blood pressure is excellent, we will call if labs are abnormal and meds need changing   All the best for the season and New Year  It is important that you exercise regularly at least 30 minutes 5 times a week. If you develop chest pain, have severe difficulty breathing, or feel very tired, stop exercising immediately and seek medical attention    We will contact you re labs needed for next visit

## 2013-12-06 NOTE — Progress Notes (Signed)
   Subjective:    Patient ID: Lisa Mann, female    DOB: 16-Jan-1933, 80 y.o.   MRN: [id number]  HPI The PT is here for follow up and re-evaluation of chronic medical conditions, medication management and review of any available recent lab and radiology data.  Preventive health is updated, specifically  Cancer screening and Immunization.   Questions or concerns regarding consultations or procedures which the PT has had in the interim are  addressed. The PT denies any adverse reactions to current medications since the last visit.  There are no new concerns.  There are no specific complaints       Review of Systems See HPI Denies recent fever or chills. Denies sinus pressure, nasal congestion, ear pain or sore throat. Denies chest congestion, productive cough or wheezing. Denies chest pains, palpitations and leg swelling Denies abdominal pain, nausea, vomiting,diarrhea or constipation.   Denies dysuria, frequency, hesitancy or incontinence. Denies joint pain, swelling and limitation in mobility. Denies headaches, seizures, numbness, or tingling. Denies depression, anxiety or insomnia. Denies skin break down or rash.        Objective:   Physical Exam  BP 120/78 mmHg  Pulse 71  Resp 16  Ht 5\' 4"  (1.626 m)  Wt 190 lb 1.9 oz (86.238 kg)  BMI 32.62 kg/m2  SpO2 99% Patient alert and oriented and in no cardiopulmonary distress.  HEENT: No facial asymmetry, EOMI,   oropharynx pink and moist.  Neck supple no JVD, no mass.  Chest: Clear to auscultation bilaterally.  CVS: S1, S2 no murmurs, no S3.Regular rate.  ABD: Soft non tender.   Ext: No edema  MS: Adequate ROM spine, shoulders, hips and knees.  Skin: Intact, no ulcerations or rash noted.  Psych: Good eye contact, normal affect. Memory intact not anxious or depressed appearing.  CNS: CN 2-12 intact, power,  normal throughout.no focal deficits noted.       Assessment & Plan:  HTN, goal below  130/80 Controlled, no change in medication DASH diet and commitment to daily physical activity for a minimum of 30 minutes discussed and encouraged, as a part of hypertension management. The importance of attaining a healthy weight is also discussed.   Diabetes mellitus with neuropathy Updated lab needed at/ before next visit. Patient advised to reduce carb and sweets, commit to regular physical activity, take meds as prescribed, test blood as directed, and attempt to lose weight, to improve blood sugar control.   Obesity (BMI 30.0-34.9) Deteriorated. Patient re-educated about  the importance of commitment to a  minimum of 150 minutes of exercise per week. The importance of healthy food choices with portion control discussed. Encouraged to start a food diary, count calories and to consider  joining a support group. Sample diet sheets offered. Goals set by the patient for the next several months.     Hyperlipidemia with target LDL less than 100 Controlled, no change in medication Hyperlipidemia:Low fat diet discussed and encouraged.

## 2013-12-06 NOTE — Assessment & Plan Note (Signed)
Controlled, no change in medication DASH diet and commitment to daily physical activity for a minimum of 30 minutes discussed and encouraged, as a part of hypertension management. The importance of attaining a healthy weight is also discussed.  

## 2013-12-07 LAB — HEMOGLOBIN A1C
Hgb A1c MFr Bld: 6.7 % — ABNORMAL HIGH (ref <5.7)
Mean Plasma Glucose: 146 mg/dL — ABNORMAL HIGH (ref <117)

## 2014-01-21 ENCOUNTER — Other Ambulatory Visit: Payer: Self-pay | Admitting: Family Medicine

## 2014-01-24 DIAGNOSIS — B351 Tinea unguium: Secondary | ICD-10-CM | POA: Diagnosis not present

## 2014-01-24 DIAGNOSIS — L609 Nail disorder, unspecified: Secondary | ICD-10-CM | POA: Diagnosis not present

## 2014-01-24 DIAGNOSIS — E1342 Other specified diabetes mellitus with diabetic polyneuropathy: Secondary | ICD-10-CM | POA: Diagnosis not present

## 2014-01-24 DIAGNOSIS — M79671 Pain in right foot: Secondary | ICD-10-CM | POA: Diagnosis not present

## 2014-01-24 DIAGNOSIS — L851 Acquired keratosis [keratoderma] palmaris et plantaris: Secondary | ICD-10-CM | POA: Diagnosis not present

## 2014-02-15 ENCOUNTER — Other Ambulatory Visit: Payer: Self-pay

## 2014-02-15 DIAGNOSIS — I10 Essential (primary) hypertension: Secondary | ICD-10-CM

## 2014-02-15 MED ORDER — RAMIPRIL 10 MG PO CAPS: 10.0000 mg | ORAL_CAPSULE | Freq: Every day | ORAL | Status: DC

## 2014-02-23 ENCOUNTER — Encounter: Payer: Self-pay | Admitting: Family Medicine

## 2014-02-23 ENCOUNTER — Ambulatory Visit (INDEPENDENT_AMBULATORY_CARE_PROVIDER_SITE_OTHER): Payer: Medicare Other | Admitting: Family Medicine

## 2014-02-23 VITALS — BP 130/80 | HR 82 | Resp 18 | Ht 64.0 in | Wt 185.1 lb

## 2014-02-23 DIAGNOSIS — I10 Essential (primary) hypertension: Secondary | ICD-10-CM | POA: Diagnosis not present

## 2014-02-23 DIAGNOSIS — J069 Acute upper respiratory infection, unspecified: Secondary | ICD-10-CM

## 2014-02-23 DIAGNOSIS — B9789 Other viral agents as the cause of diseases classified elsewhere: Principal | ICD-10-CM

## 2014-02-23 DIAGNOSIS — J3089 Other allergic rhinitis: Secondary | ICD-10-CM

## 2014-02-23 NOTE — Patient Instructions (Signed)
F/u as before  You had a stomach virus last week which has cleared on it's own  For head and chest congestion , mucinex one daily , with a lot of water/ lemon water is good. After the next 3 days you should not need the mucinex  PLS call if in the next 1 week you start developing chills, fever and feeling more ill

## 2014-02-23 NOTE — Progress Notes (Signed)
   Subjective:    Patient ID: Lisa Mann, female    DOB: 03/01/1933, 81 y.o.   MRN: [id number]  HPI Last week Wednesday pt acutely had voomiting and diarrheah for approx 3 hrs between 11am  And 2pm, she has progressively improved, and now back to two stool, slightly soft 1 week h/o cough and chest congestion , sometimes chills, slightly yellow sputum, gradually improving and has added mucinex daily in the past 2 days with symptom improvement Denies polyuria, polydipsia, blurred vision , or hypoglycemic episodes.    Review of Systems See HPI Denies chest pains, palpitations and leg swelling  Denies dysuria, frequency, hesitancy or incontinence. Denies joint pain, swelling and limitation in mobility. Denies headaches, seizures, numbness, or tingling. Denies depression, anxiety has had  Insomnia due to excess cough Denies skin break down or rash.        Objective:   Physical Exam  BP 130/80 mmHg  Pulse 82  Resp 18  Ht 5\' 4"  (1.626 m)  Wt 185 lb 1.9 oz (83.97 kg)  BMI 31.76 kg/m2  SpO2 97% Patient alert and oriented and in no cardiopulmonary distress.  HEENT: No facial asymmetry, EOMI,   oropharynx pink and moist.  Neck supple no JVD, no mass. No sinus tenderness. TM clear bilaterally nasal mucosa erythematous and edematous, bilateral conjunctival injection with excessive watering Chest: Clear to auscultation bilaterally.  CVS: S1, S2 no murmurs, no S3.Regular rate.  ABD: Soft non tender. Normal BS  Ext: No edema    Psych: Good eye contact, normal affect. Memory intact mildly  anxious or depressed appearing.  CNS: CN 2-12 intact, power,  normal throughout.no focal deficits noted.        Assessment & Plan:  HTN, goal below 130/80 Controlled, no change in medication DASH diet and commitment to daily physical activity for a minimum of 30 minutes discussed and encouraged, as a part of hypertension management. The importance of attaining a healthy weight is also  discussed.  BP/Weight 04/05/2014 02/23/2014 12/06/2013 11/22/2013 09/07/2013 4/69/6295 02/14/4130  Systolic BP 440 102 725 366 440 347 425  Diastolic BP 78 80 78 62 78 82 74  Wt. (Lbs) 186 185.12 190.12 190.7 195.12 194.12 198  BMI 32.19 31.76 32.62 33 34.02 33.84 34.52          Allergic rhinitis Uncontrolled with head and chest congestion x 1 week, clear drainage and sputum , no fever, had chills. Pt to use daily mucinex for an additional 3 to 5 days, call back if symptoms worsen   Viral URI with cough Acute flare of respiratory symptoms, daily allergy meds as before and medication for symptoms short term

## 2014-03-25 DIAGNOSIS — E119 Type 2 diabetes mellitus without complications: Secondary | ICD-10-CM | POA: Diagnosis not present

## 2014-03-25 DIAGNOSIS — H2513 Age-related nuclear cataract, bilateral: Secondary | ICD-10-CM | POA: Diagnosis not present

## 2014-03-25 DIAGNOSIS — H1851 Endothelial corneal dystrophy: Secondary | ICD-10-CM | POA: Diagnosis not present

## 2014-04-05 ENCOUNTER — Encounter: Payer: Self-pay | Admitting: Family Medicine

## 2014-04-05 ENCOUNTER — Ambulatory Visit (INDEPENDENT_AMBULATORY_CARE_PROVIDER_SITE_OTHER): Payer: Medicare Other | Admitting: Family Medicine

## 2014-04-05 VITALS — BP 120/78 | HR 78 | Resp 16 | Ht 63.75 in | Wt 186.0 lb

## 2014-04-05 DIAGNOSIS — Z0000 Encounter for general adult medical examination without abnormal findings: Secondary | ICD-10-CM | POA: Insufficient documentation

## 2014-04-05 DIAGNOSIS — E559 Vitamin D deficiency, unspecified: Secondary | ICD-10-CM

## 2014-04-05 DIAGNOSIS — E785 Hyperlipidemia, unspecified: Secondary | ICD-10-CM

## 2014-04-05 DIAGNOSIS — E1340 Other specified diabetes mellitus with diabetic neuropathy, unspecified: Secondary | ICD-10-CM

## 2014-04-05 DIAGNOSIS — E790 Hyperuricemia without signs of inflammatory arthritis and tophaceous disease: Secondary | ICD-10-CM

## 2014-04-05 DIAGNOSIS — R7301 Impaired fasting glucose: Secondary | ICD-10-CM

## 2014-04-05 DIAGNOSIS — F329 Major depressive disorder, single episode, unspecified: Secondary | ICD-10-CM

## 2014-04-05 DIAGNOSIS — F32A Depression, unspecified: Secondary | ICD-10-CM

## 2014-04-05 DIAGNOSIS — I10 Essential (primary) hypertension: Secondary | ICD-10-CM

## 2014-04-05 MED ORDER — FLUOXETINE HCL 10 MG PO TABS: 10.0000 mg | ORAL_TABLET | Freq: Every day | ORAL | Status: DC

## 2014-04-05 MED ORDER — TRIAMTERENE-HCTZ 75-50 MG PO TABS: ORAL_TABLET | ORAL | Status: DC

## 2014-04-05 MED ORDER — OMEPRAZOLE 40 MG PO CPDR: DELAYED_RELEASE_CAPSULE | ORAL | Status: DC

## 2014-04-05 NOTE — Assessment & Plan Note (Signed)

## 2014-04-05 NOTE — Patient Instructions (Signed)
F/u in 2 month, with MMSE, call if you need me before  New for depression is fluoxetine daily and commit to daily exercise for 30 mins 5 days per week please  Call number on back of blue sheet for help working through end of life issues and we will discuss further  Reduce omeprazole to every other day , then try to stop this medicine  Fasting lipid, cmp and EGFr , HBA1c, uric acid and vit D in 2 months, prior to next vist  Script for sports bras today as discussed

## 2014-04-05 NOTE — Progress Notes (Signed)
Subjective:    Patient ID: Lisa Mann, female    DOB: Jun 16, 1933, 81 y.o.   MRN: [id number]  HPI Preventive Screening-Counseling & Management   Patient present here today for a Medicare annual wellness visit, subsequent   Current Problems (verified)   Medications Prior to Visit Allergies (verified)   PAST HISTORY  Family History (verified)  Social History she is a widow, husband passed a few years back, states she has 4 children and retired from [employer]  Current exercise habits:  Doesn't exercise much at this time   Dietary issues discussed: Heart healthy diet discussed, limits fried fatty foods and tries to increase fruits and vegetables    Cardiac risk factors: DM  Depression Screen  (Note: if answer to either of the following is "Yes", a more complete depression screening is indicated)   Over the past two weeks, have you felt down, depressed or hopeless? Yes, will do PHQ9 Over the past two weeks, have you felt little interest or pleasure in doing things? No  Have you lost interest or pleasure in daily life? no Do you often feel hopeless? no Do you cry easily over simple problems? No   Activities of Daily Living  In your present state of health, do you have any difficulty performing the following activities?  Driving?: No Managing money?: No Feeding yourself?:No Getting from bed to chair?:No Climbing a flight of stairs?: no, she takes her time Preparing food and eating?:No Bathing or showering?:No, uses a shower chair  Getting dressed?:No Getting to the toilet?:No Using the toilet?:No Moving around from place to place?: No, sometimes uses a cane if she has to walk long distances. Very careful not to fall   Fall Risk Assessment In the past year have you fallen or had a near fall?:No Are you currently taking any medications that make you dizzy?:No   Hearing Difficulties: No Do you often ask people to speak up or repeat  themselves?:No Do you experience ringing or noises in your ears?:No Do you have difficulty understanding soft or whispered voices?:No  Cognitive Testing  Alert? Yes Normal Appearance?Yes  Oriented to person? Yes Place? Yes  Time? Yes  Displays appropriate judgment?Yes  Can read the correct time from a watch face? yes Are you having problems remembering things? Some times small things slip her mind   Advanced Directives have been discussed with the patient?Yes , full code, needs tpo discuss with her children further details   List the Names of Other Physician/Practitioners you currently use: oncologist, opthalmologist   Indicate any recent Medical Services you may have received from other than [facility] providers in the past year (date may be approximate).   Assessment:    Annual Wellness Exam   Plan:    .  Medicare Attestation  I have personally reviewed:  The patient's medical and social history  Their use of alcohol, tobacco or illicit drugs  Their current medications and supplements  The patient's functional ability including ADLs,fall risks, home safety risks, cognitive, and hearing and visual impairment  Diet and physical activities  Evidence for depression or mood disorders  The patient's weight, height, BMI, and visual acuity have been recorded in the chart. I have made referrals, counseling, and provided education to the patient based on review of the above and I have provided the patient with a written personalized care plan for preventive services.      Review of Systems     Objective:   Physical  Exam BP 120/78 mmHg  Pulse 78  Resp 16  Ht 5' 3.75" (1.619 m)  Wt 186 lb (84.369 kg)  BMI 32.19 kg/m2  SpO2 100%        Assessment & Plan:  Medicare annual wellness visit, subsequent Annual exam as documented. Counseling done  re healthy lifestyle involving commitment to 150 minutes exercise per week, heart healthy diet, and attaining healthy weight.The  importance of adequate sleep also discussed. Regular seat belt use and home safety, is also discussed. Changes in health habits are decided on by the patient with goals and time frames  set for achieving them. Immunization and cancer screening needs are specifically addressed at this visit.    Depression Not suicidal , homicidal or hallucinating States she has a family member who essentially counsels her, interested in medication , will start low dose , re evaluate in 6 to 8 weeks

## 2014-04-05 NOTE — Assessment & Plan Note (Addendum)
Not suicidal , homicidal or hallucinating States she has a family member who essentially counsels her, interested in medication , will start low dose , re evaluate in 6 to 8 weeks

## 2014-04-11 DIAGNOSIS — L609 Nail disorder, unspecified: Secondary | ICD-10-CM | POA: Diagnosis not present

## 2014-04-11 DIAGNOSIS — L851 Acquired keratosis [keratoderma] palmaris et plantaris: Secondary | ICD-10-CM | POA: Diagnosis not present

## 2014-04-11 DIAGNOSIS — B351 Tinea unguium: Secondary | ICD-10-CM | POA: Diagnosis not present

## 2014-04-11 DIAGNOSIS — E1342 Other specified diabetes mellitus with diabetic polyneuropathy: Secondary | ICD-10-CM | POA: Diagnosis not present

## 2014-04-17 DIAGNOSIS — B9789 Other viral agents as the cause of diseases classified elsewhere: Principal | ICD-10-CM

## 2014-04-17 DIAGNOSIS — J069 Acute upper respiratory infection, unspecified: Secondary | ICD-10-CM | POA: Insufficient documentation

## 2014-04-17 NOTE — Assessment & Plan Note (Signed)
Acute flare of respiratory symptoms, daily allergy meds as before and medication for symptoms short term

## 2014-04-17 NOTE — Assessment & Plan Note (Signed)
Controlled, no change in medication DASH diet and commitment to daily physical activity for a minimum of 30 minutes discussed and encouraged, as a part of hypertension management. The importance of attaining a healthy weight is also discussed.  BP/Weight 04/05/2014 02/23/2014 12/06/2013 11/22/2013 09/07/2013 7/89/3810 01/13/5100  Systolic BP 585 277 824 235 361 443 154  Diastolic BP 78 80 78 62 78 82 74  Wt. (Lbs) 186 185.12 190.12 190.7 195.12 194.12 198  BMI 32.19 31.76 32.62 33 34.02 33.84 34.52

## 2014-04-17 NOTE — Assessment & Plan Note (Signed)
Uncontrolled with head and chest congestion x 1 week, clear drainage and sputum , no fever, had chills. Pt to use daily mucinex for an additional 3 to 5 days, call back if symptoms worsen

## 2014-05-06 DIAGNOSIS — R7301 Impaired fasting glucose: Secondary | ICD-10-CM | POA: Diagnosis not present

## 2014-05-06 DIAGNOSIS — R799 Abnormal finding of blood chemistry, unspecified: Secondary | ICD-10-CM | POA: Diagnosis not present

## 2014-05-06 DIAGNOSIS — E785 Hyperlipidemia, unspecified: Secondary | ICD-10-CM | POA: Diagnosis not present

## 2014-05-06 DIAGNOSIS — E559 Vitamin D deficiency, unspecified: Secondary | ICD-10-CM | POA: Diagnosis not present

## 2014-05-06 DIAGNOSIS — E790 Hyperuricemia without signs of inflammatory arthritis and tophaceous disease: Secondary | ICD-10-CM | POA: Diagnosis not present

## 2014-05-06 DIAGNOSIS — I10 Essential (primary) hypertension: Secondary | ICD-10-CM | POA: Diagnosis not present

## 2014-05-07 LAB — COMPLETE METABOLIC PANEL WITH GFR
ALT: 17 U/L (ref 0–35)
AST: 16 U/L (ref 0–37)
Albumin: 4.3 g/dL (ref 3.5–5.2)
Alkaline Phosphatase: 48 U/L (ref 39–117)
BUN: 14 mg/dL (ref 6–23)
CO2: 32 mEq/L (ref 19–32)
Calcium: 9.9 mg/dL (ref 8.4–10.5)
Chloride: 96 mEq/L (ref 96–112)
Creat: 0.65 mg/dL (ref 0.50–1.10)
GFR, Est African American: >89 mL/min
GFR, Est Non African American: 85 mL/min
Glucose, Bld: 111 mg/dL — ABNORMAL HIGH (ref 70–99)
Potassium: 5.1 mEq/L (ref 3.5–5.3)
Sodium: 138 mEq/L (ref 135–145)
Total Bilirubin: 0.4 mg/dL (ref 0.2–1.2)
Total Protein: 7.4 g/dL (ref 6.0–8.3)

## 2014-05-07 LAB — VITAMIN D 25 HYDROXY (VIT D DEFICIENCY, FRACTURES): Vit D, 25-Hydroxy: 10 ng/mL — ABNORMAL LOW (ref 30–100)

## 2014-05-07 LAB — URIC ACID: Uric Acid, Serum: 3 mg/dL (ref 2.4–7.0)

## 2014-05-07 LAB — LIPID PANEL
Cholesterol: 165 mg/dL (ref 0–200)
HDL: 52 mg/dL (ref >=46)
LDL Cholesterol: 96 mg/dL (ref 0–99)
Total CHOL/HDL Ratio: 3.2 Ratio
Triglycerides: 85 mg/dL (ref <150)
VLDL: 17 mg/dL (ref 0–40)

## 2014-05-07 LAB — HEMOGLOBIN A1C
Hgb A1c MFr Bld: 6.7 % — ABNORMAL HIGH (ref <5.7)
Mean Plasma Glucose: 146 mg/dL — ABNORMAL HIGH (ref <117)

## 2014-05-10 ENCOUNTER — Encounter: Payer: Self-pay | Admitting: Family Medicine

## 2014-05-10 ENCOUNTER — Ambulatory Visit (INDEPENDENT_AMBULATORY_CARE_PROVIDER_SITE_OTHER): Payer: Medicare Other | Admitting: Family Medicine

## 2014-05-10 ENCOUNTER — Ambulatory Visit (HOSPITAL_COMMUNITY)
Admission: RE | Admit: 2014-05-10 | Discharge: 2014-05-10 | Disposition: A | Discharge status: Home / Self Care | Payer: Medicare Other | Source: Ambulatory Visit | Attending: Family Medicine | Admitting: Family Medicine

## 2014-05-10 VITALS — BP 118/70 | HR 82 | Resp 16 | Ht 64.0 in | Wt 185.4 lb

## 2014-05-10 DIAGNOSIS — M25511 Pain in right shoulder: Secondary | ICD-10-CM

## 2014-05-10 DIAGNOSIS — M19011 Primary osteoarthritis, right shoulder: Secondary | ICD-10-CM | POA: Diagnosis not present

## 2014-05-10 DIAGNOSIS — M542 Cervicalgia: Secondary | ICD-10-CM

## 2014-05-10 DIAGNOSIS — E785 Hyperlipidemia, unspecified: Secondary | ICD-10-CM | POA: Diagnosis not present

## 2014-05-10 DIAGNOSIS — M79601 Pain in right arm: Secondary | ICD-10-CM | POA: Insufficient documentation

## 2014-05-10 DIAGNOSIS — F329 Major depressive disorder, single episode, unspecified: Secondary | ICD-10-CM

## 2014-05-10 DIAGNOSIS — I10 Essential (primary) hypertension: Secondary | ICD-10-CM | POA: Diagnosis not present

## 2014-05-10 DIAGNOSIS — M25611 Stiffness of right shoulder, not elsewhere classified: Secondary | ICD-10-CM | POA: Diagnosis not present

## 2014-05-10 DIAGNOSIS — M79621 Pain in right upper arm: Secondary | ICD-10-CM

## 2014-05-10 DIAGNOSIS — E669 Obesity, unspecified: Secondary | ICD-10-CM

## 2014-05-10 DIAGNOSIS — E1340 Other specified diabetes mellitus with diabetic neuropathy, unspecified: Secondary | ICD-10-CM

## 2014-05-10 DIAGNOSIS — M25521 Pain in right elbow: Secondary | ICD-10-CM

## 2014-05-10 DIAGNOSIS — F32A Depression, unspecified: Secondary | ICD-10-CM

## 2014-05-10 MED ORDER — METFORMIN HCL 1000 MG PO TABS: ORAL_TABLET | ORAL | Status: DC

## 2014-05-10 MED ORDER — POTASSIUM CHLORIDE CRYS ER 20 MEQ PO TBCR: EXTENDED_RELEASE_TABLET | ORAL | Status: DC

## 2014-05-10 NOTE — Progress Notes (Signed)
Subjective:    Patient ID: Lisa Mann, female    DOB: 06-24-1933, 81 y.o.   MRN: [id number]  HPI The PT is here for follow up and re-evaluation of chronic medical conditions, medication management and review of any available recent lab and radiology data.  Preventive health is updated, specifically  Cancer screening and Immunization.   Questions or concerns regarding consultations or procedures which the PT has had in the interim are  addressed. The PT denies any adverse reactions to current medications since the last visit. Does not feel that she needs the fluoxetine, does not see where it has made a difference and her depression screen today is negative, she would prefer to stop the medication .  MMSE done today and is 29. 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 Denies sinus pressure, nasal congestion, ear pain or sore throat. Denies chest congestion, productive cough or wheezing. Denies chest pains, palpitations and leg swelling Denies abdominal pain, nausea, vomiting,diarrhea or constipation.   Denies dysuria, frequency, hesitancy or incontinence. . Denies depression, anxiety or insomnia. Denies skin break down or rash.        Objective:   Physical Exam BP 118/70 mmHg  Pulse 82  Resp 16  Ht 5\' 4"  (1.626 m)  Wt 185 lb 6.4 oz (84.097 kg)  BMI 31.81 kg/m2  SpO2 97% Patient alert and oriented and in no cardiopulmonary distress.  HEENT: No facial asymmetry, EOMI,   oropharynx pink and moist.  Neck decreased though adequate ROM c spine no JVD, no mass.  Chest: Clear to auscultation bilaterally.  CVS: S1, S2 no murmurs, no S3.Regular rate.  ABD: Soft non tender.   Ext: No edema  MS: Adequate though reduced  ROM spine, shoulders, hips and  knees.  Skin: Intact, no ulcerations or rash noted.  Psych: Good eye contact, normal affect. Memory intact not anxious or depressed appearing.  CNS: CN 2-12 intact, grade 4 power in right upper extremity , otherwise  power,  normal throughout.no focal deficits noted.        Assessment & Plan:  Shoulder pain, right Xray of shoulder and upper arm she has h/o breast cancer   Depression Resolved, pt wanmts a trial off of fluoxetine, feels that she does not " "really need" the medication   Neck pain on right side Uncontrolled pain, established disease. Right upper extremity weakness, needs rept MRI   Diabetes mellitus with neuropathy  Controlled, no change in medication Patient educated about the importance of limiting  Carbohydrate intake , the need to commit to daily physical activity for a minimum of 30 minutes , and to commit weight loss. The fact that changes in all these areas will help to control her  diabetes is stressed.   Diabetic Labs Latest Ref Rng 05/06/2014 12/06/2013 11/22/2013 07/14/2013 04/06/2013  HbA1c <5.7 % 6.7(H) 6.7(H) - 6.6(H) -  Microalbumin 0.00 - 1.89 mg/dL - - - 0.96 -  Micro/Creat Ratio 0.0 - 30.0 mg/g - - - 4.6 -  Chol 0 - 200 mg/dL 165 138 - 158 -  HDL >=46 mg/dL 52 42 - 44 -  Calc LDL 0 - 99 mg/dL 96 84 - 101(H) -  Triglycerides <150 mg/dL 85 59 - 67 -  Creatinine  0.50 - 1.10 mg/dL 0.65 0.61 0.70 0.68 0.72   BP/Weight 05/13/2014 05/10/2014 04/05/2014 02/23/2014 12/06/2013 83/72/9021 01/07/5518  Systolic BP - 802 233 612 244 975 300  Diastolic BP - 70 78 80 78 62 78  Wt. (Lbs) 185 185.4 186 185.12 190.12 190.7 195.12  BMI 32.78 31.81 32.19 31.76 32.62 33 34.02   Foot/eye exam completion dates Latest Ref Rng 09/24/2013 07/20/2013  Eye Exam No Retinopathy No Retinopathy -  Foot exam Order - - -  Foot Form Completion - - Done            HTN, goal below 130/80 Controlled, no change in medication DASH diet and commitment to daily physical activity  for a minimum of 30 minutes discussed and encouraged, as a part of hypertension management. The importance of attaining a healthy weight is also discussed.  BP/Weight 05/13/2014 05/10/2014 04/05/2014 02/23/2014 12/06/2013 51/10/2109 07/09/5668  Systolic BP - 141 030 131 438 887 579  Diastolic BP - 70 78 80 78 62 78  Wt. (Lbs) 185 185.4 186 185.12 190.12 190.7 195.12  BMI 32.78 31.81 32.19 31.76 32.62 33 34.02         Hyperlipidemia with target LDL less than 100 Controlled, no change in medication Hyperlipidemia:Low fat diet discussed and encouraged.   Lipid Panel  Lab Results  Component Value Date   CHOL 165 05/06/2014   HDL 52 05/06/2014   LDLCALC 96 05/06/2014   TRIG 85 05/06/2014   CHOLHDL 3.2 05/06/2014         Obesity (BMI 30.0-34.9) Unchnaged Patient re-educated about  the importance of commitment to a  minimum of 150 minutes of exercise per week.  The importance of healthy food choices with portion control discussed. Encouraged to start a food diary, count calories and to consider  joining a support group. Sample diet sheets offered. Goals set by the patient for the next several months.   Weight /BMI 05/13/2014 05/10/2014 04/05/2014  WEIGHT 185 lb 185 lb 6.4 oz 186 lb  HEIGHT 5\' 3"  5\' 4"  5' 3.75"  BMI 32.78 kg/m2 31.81 kg/m2 32.19 kg/m2    Current exercise per week 60 minutes.

## 2014-05-10 NOTE — Patient Instructions (Addendum)
Annual physical exam 2nd week in September, call if you need me before  Stop fluoxetine  Xray of right shoulder and right arm  today, and I do believe you will likely need an MRI of your neck  Labs are very good, no med changes  All the best   Thanks for choosing [facility], we consider it a privelige to serve you.   Start Vit D3 800 IU one daily, this is OTC

## 2014-05-13 ENCOUNTER — Ambulatory Visit (HOSPITAL_COMMUNITY)
Admission: RE | Admit: 2014-05-13 | Discharge: 2014-05-13 | Disposition: A | Discharge status: Home / Self Care | Payer: Medicare Other | Source: Ambulatory Visit | Attending: Family Medicine | Admitting: Family Medicine

## 2014-05-13 ENCOUNTER — Other Ambulatory Visit: Payer: Self-pay | Admitting: Family Medicine

## 2014-05-13 DIAGNOSIS — M4722 Other spondylosis with radiculopathy, cervical region: Principal | ICD-10-CM

## 2014-05-13 DIAGNOSIS — E041 Nontoxic single thyroid nodule: Secondary | ICD-10-CM

## 2014-05-13 DIAGNOSIS — S199XXA Unspecified injury of neck, initial encounter: Secondary | ICD-10-CM | POA: Diagnosis not present

## 2014-05-13 DIAGNOSIS — M79601 Pain in right arm: Secondary | ICD-10-CM | POA: Insufficient documentation

## 2014-05-13 DIAGNOSIS — R531 Weakness: Secondary | ICD-10-CM | POA: Insufficient documentation

## 2014-05-13 DIAGNOSIS — M4712 Other spondylosis with myelopathy, cervical region: Secondary | ICD-10-CM

## 2014-05-13 DIAGNOSIS — M47812 Spondylosis without myelopathy or radiculopathy, cervical region: Secondary | ICD-10-CM | POA: Insufficient documentation

## 2014-05-13 DIAGNOSIS — M542 Cervicalgia: Secondary | ICD-10-CM | POA: Diagnosis not present

## 2014-05-13 DIAGNOSIS — M47892 Other spondylosis, cervical region: Secondary | ICD-10-CM | POA: Diagnosis not present

## 2014-05-13 NOTE — Assessment & Plan Note (Signed)
Uncontrolled pain, established disease. Right upper extremity weakness, needs rept MRI

## 2014-05-13 NOTE — Assessment & Plan Note (Signed)
Controlled, no change in medication DASH diet and commitment to daily physical activity for a minimum of 30 minutes discussed and encouraged, as a part of hypertension management. The importance of attaining a healthy weight is also discussed.  BP/Weight 05/13/2014 05/10/2014 04/05/2014 02/23/2014 12/06/2013 99/14/4458 04/14/3505  Systolic BP - 573 225 672 091 980 221  Diastolic BP - 70 78 80 78 62 78  Wt. (Lbs) 185 185.4 186 185.12 190.12 190.7 195.12  BMI 32.78 31.81 32.19 31.76 32.62 33 34.02

## 2014-05-13 NOTE — Assessment & Plan Note (Signed)
Unchnaged Patient re-educated about  the importance of commitment to a  minimum of 150 minutes of exercise per week.  The importance of healthy food choices with portion control discussed. Encouraged to start a food diary, count calories and to consider  joining a support group. Sample diet sheets offered. Goals set by the patient for the next several months.   Weight /BMI 05/13/2014 05/10/2014 04/05/2014  WEIGHT 185 lb 185 lb 6.4 oz 186 lb  HEIGHT 5\' 3"  5\' 4"  5' 3.75"  BMI 32.78 kg/m2 31.81 kg/m2 32.19 kg/m2    Current exercise per week 60 minutes.

## 2014-05-13 NOTE — Assessment & Plan Note (Addendum)
  Controlled, no change in medication Patient educated about the importance of limiting  Carbohydrate intake , the need to commit to daily physical activity for a minimum of 30 minutes , and to commit weight loss. The fact that changes in all these areas will help to control her  diabetes is stressed.   Diabetic Labs Latest Ref Rng 05/06/2014 12/06/2013 11/22/2013 07/14/2013 04/06/2013  HbA1c <5.7 % 6.7(H) 6.7(H) - 6.6(H) -  Microalbumin 0.00 - 1.89 mg/dL - - - 0.96 -  Micro/Creat Ratio 0.0 - 30.0 mg/g - - - 4.6 -  Chol 0 - 200 mg/dL 165 138 - 158 -  HDL >=46 mg/dL 52 42 - 44 -  Calc LDL 0 - 99 mg/dL 96 84 - 101(H) -  Triglycerides <150 mg/dL 85 59 - 67 -  Creatinine 0.50 - 1.10 mg/dL 0.65 0.61 0.70 0.68 0.72   BP/Weight 05/13/2014 05/10/2014 04/05/2014 02/23/2014 12/06/2013 31/51/7616 0/07/3708  Systolic BP - 626 948 546 270 350 093  Diastolic BP - 70 78 80 78 62 78  Wt. (Lbs) 185 185.4 186 185.12 190.12 190.7 195.12  BMI 32.78 31.81 32.19 31.76 32.62 33 34.02   Foot/eye exam completion dates Latest Ref Rng 09/24/2013 07/20/2013  Eye Exam No Retinopathy No Retinopathy -  Foot exam Order - - -  Foot Form Completion - - Done

## 2014-05-13 NOTE — Assessment & Plan Note (Signed)
Resolved, pt wanmts a trial off of fluoxetine, feels that she does not " "really need" the medication

## 2014-05-13 NOTE — Assessment & Plan Note (Signed)
Xray of shoulder and upper arm she has h/o breast cancer

## 2014-05-13 NOTE — Assessment & Plan Note (Signed)
Controlled, no change in medication Hyperlipidemia:Low fat diet discussed and encouraged.   Lipid Panel  Lab Results  Component Value Date   CHOL 165 05/06/2014   HDL 52 05/06/2014   LDLCALC 96 05/06/2014   TRIG 85 05/06/2014   CHOLHDL 3.2 05/06/2014

## 2014-05-18 ENCOUNTER — Ambulatory Visit (HOSPITAL_COMMUNITY): Payer: Medicare Other

## 2014-05-19 ENCOUNTER — Ambulatory Visit (HOSPITAL_COMMUNITY)
Admission: RE | Admit: 2014-05-19 | Discharge: 2014-05-19 | Disposition: A | Discharge status: Home / Self Care | Payer: Medicare Other | Source: Ambulatory Visit | Attending: Family Medicine | Admitting: Family Medicine

## 2014-05-19 ENCOUNTER — Other Ambulatory Visit: Payer: Self-pay | Admitting: Family Medicine

## 2014-05-19 DIAGNOSIS — E041 Nontoxic single thyroid nodule: Secondary | ICD-10-CM | POA: Insufficient documentation

## 2014-05-23 ENCOUNTER — Telehealth: Payer: Self-pay

## 2014-05-23 ENCOUNTER — Other Ambulatory Visit: Payer: Self-pay | Admitting: Family Medicine

## 2014-05-23 MED ORDER — PREDNISONE 5 MG (21) PO TBPK: 5.0000 mg | ORAL_TABLET | ORAL | Status: DC

## 2014-05-23 NOTE — Telephone Encounter (Signed)
I have entered and recommend gabapentin at bedtime (1) , IO have sent in a 5 mg prednisone dose pack I recommend you offer toradol 60 mg Im and depo medrol 80 mg IM to help acutely with pain as nurse visit. I have entered tylenol 500 mg one twice daily for pain also this is OTC Let her know to keep in touch if pain still; uncontrolled please

## 2014-05-24 ENCOUNTER — Other Ambulatory Visit: Payer: Self-pay

## 2014-05-24 MED ORDER — GABAPENTIN 300 MG PO CAPS: 300.0000 mg | ORAL_CAPSULE | Freq: Every day | ORAL | Status: DC

## 2014-05-24 NOTE — Telephone Encounter (Signed)
Patient aware and med sent to pharmacy.  Will come by the office today if she is out

## 2014-05-26 ENCOUNTER — Ambulatory Visit (INDEPENDENT_AMBULATORY_CARE_PROVIDER_SITE_OTHER): Payer: Medicare Other

## 2014-05-26 VITALS — BP 118/74 | Wt 184.0 lb

## 2014-05-26 DIAGNOSIS — M79601 Pain in right arm: Secondary | ICD-10-CM

## 2014-05-26 MED ORDER — METHYLPREDNISOLONE ACETATE 80 MG/ML IJ SUSP
80.0000 mg | Freq: Once | INTRAMUSCULAR | Status: AC
  Administered 2014-05-26: 80 mg via INTRAMUSCULAR

## 2014-05-26 MED ORDER — KETOROLAC TROMETHAMINE 60 MG/2ML IM SOLN
60.0000 mg | Freq: Once | INTRAMUSCULAR | Status: AC
  Administered 2014-05-26: 60 mg via INTRAMUSCULAR

## 2014-05-26 NOTE — Progress Notes (Signed)
Patient is unable to be seen by neurosurgeon until 6/23 because she chose to be seen in [place]. She is asking is there any medicine that can be prescribed until she goes to that appointment to help with her right arm pain. T60 and D80 given IM with no complications

## 2014-06-20 DIAGNOSIS — E1342 Other specified diabetes mellitus with diabetic polyneuropathy: Secondary | ICD-10-CM | POA: Diagnosis not present

## 2014-06-20 DIAGNOSIS — L609 Nail disorder, unspecified: Secondary | ICD-10-CM | POA: Diagnosis not present

## 2014-06-20 DIAGNOSIS — B351 Tinea unguium: Secondary | ICD-10-CM | POA: Diagnosis not present

## 2014-06-20 DIAGNOSIS — L851 Acquired keratosis [keratoderma] palmaris et plantaris: Secondary | ICD-10-CM | POA: Diagnosis not present

## 2014-06-30 ENCOUNTER — Ambulatory Visit (INDEPENDENT_AMBULATORY_CARE_PROVIDER_SITE_OTHER): Payer: Medicare Other | Admitting: Otolaryngology

## 2014-06-30 DIAGNOSIS — D440 Neoplasm of uncertain behavior of thyroid gland: Secondary | ICD-10-CM | POA: Diagnosis not present

## 2014-07-04 ENCOUNTER — Other Ambulatory Visit (INDEPENDENT_AMBULATORY_CARE_PROVIDER_SITE_OTHER): Payer: Self-pay | Admitting: Otolaryngology

## 2014-07-04 DIAGNOSIS — E041 Nontoxic single thyroid nodule: Secondary | ICD-10-CM

## 2014-07-07 ENCOUNTER — Ambulatory Visit (HOSPITAL_COMMUNITY)
Admission: RE | Admit: 2014-07-07 | Discharge: 2014-07-07 | Disposition: A | Discharge status: Home / Self Care | Payer: Medicare Other | Source: Ambulatory Visit | Attending: Otolaryngology | Admitting: Otolaryngology

## 2014-07-07 ENCOUNTER — Encounter (HOSPITAL_COMMUNITY): Payer: Self-pay

## 2014-07-07 DIAGNOSIS — E041 Nontoxic single thyroid nodule: Secondary | ICD-10-CM | POA: Diagnosis not present

## 2014-07-07 MED ORDER — LIDOCAINE HCL (PF) 2 % IJ SOLN
INTRAMUSCULAR | Status: AC
  Administered 2014-07-07: 2 mL
  Filled 2014-07-07: qty 10

## 2014-07-07 NOTE — Discharge Instructions (Signed)
Thyroid Biopsy °The thyroid gland is a butterfly-shaped gland situated in the front of the neck. It produces hormones which affect metabolism, growth and development, and body temperature. A thyroid biopsy is a procedure in which small samples of tissue or fluid are removed from the thyroid gland or mass and examined under a microscope. This test is done to determine the cause of thyroid problems, such as infection, cancer, or other thyroid problems. °There are 2 ways to obtain samples: °1. Fine needle biopsy. Samples are removed using a thin needle inserted through the skin and into the thyroid gland or mass. °2. Open biopsy. Samples are removed after a cut (incision) is made through the skin. °LET YOUR CAREGIVER KNOW ABOUT:  °· Allergies. °· Medications taken including herbs, eye drops, over-the-counter medications, and creams. °· Use of steroids (by mouth or creams). °· Previous problems with anesthetics or numbing medicine. °· Possibility of pregnancy, if this applies. °· History of blood clots (thrombophlebitis). °· History of bleeding or blood problems. °· Previous surgery. °· Other health problems. °RISKS AND COMPLICATIONS °· Bleeding from the site. The risk of bleeding is higher if you have a bleeding disorder or are taking any blood thinning medications (anticoagulants). °· Infection. °· Injury to structures near the thyroid gland. °BEFORE THE PROCEDURE  °This is a procedure that can be done as an outpatient. Confirm the time that you need to arrive for your procedure. Confirm whether there is a need to fast or withhold any medications. A blood sample may be done to determine your blood clotting time. Medicine may be given to help you relax (sedative). °PROCEDURE °Fine needle biopsy. °You will be awake during the procedure. You may be asked to lie on your back with your head tipped backward to extend your neck. Let your caregiver know if you cannot tolerate the positioning. An area on your neck will be  cleansed. A needle is inserted through the skin of your neck. You may feel a mild discomfort during this procedure. You may be asked to avoid coughing, talking, swallowing, or making sounds during some portions of the procedure. The needle is withdrawn once tissue or fluid samples have been removed. Pressure may be applied to the neck to reduce swelling and ensure that bleeding has stopped. The samples will be sent for examination.  °Open biopsy. °You will be given general anesthesia. You will be asleep during the procedure. An incision is made in your neck. A sample of thyroid tissue or the mass is removed. The tissue sample or mass will be sent for examination. The sample or mass may be examined during the biopsy. If the sample or mass contains cancer cells, some or all of the thyroid gland may be removed. The incision is closed with stitches. °AFTER THE PROCEDURE  °Your recovery will be assessed and monitored. If there are no problems, as an outpatient, you should be able to go home shortly after the procedure. °If you had a fine needle biopsy: °· You may have soreness at the biopsy site for 1 to 2 days. °If you had an open biopsy:  °· You may have soreness at the biopsy site for 3 to 4 days. °· You may have a hoarse voice or sore throat for 1 to 2 days. °Obtaining the Test Results °It is your responsibility to obtain your test results. Do not assume everything is normal if you have not heard from your caregiver or the medical facility. It is important for you to follow up   on all of your test results. °HOME CARE INSTRUCTIONS  °· Keeping your head raised on a pillow when you are lying down may ease biopsy site discomfort. °· Supporting the back of your head and neck with both hands as you sit up from a lying position may ease biopsy site discomfort. °· Only take over-the-counter or prescription medicines for pain, discomfort, or fever as directed by your caregiver. °· Throat lozenges or gargling with warm salt  water may help to soothe a sore throat. °SEEK IMMEDIATE MEDICAL CARE IF:  °· You have severe bleeding from the biopsy site. °· You have difficulty swallowing. °· You have a fever. °· You have increased pain, swelling, redness, or warmth at the biopsy site. °· You notice pus coming from the biopsy site. °· You have swollen glands (lymph nodes) in your neck. °Document Released: 10/21/2006 Document Revised: 04/20/2012 Document Reviewed: 03/18/2013 °[employer]® Patient Information ©2015 [employer]. This information is not intended to replace advice given to you by your health care provider. Make sure you discuss any questions you have with your health care provider. ° °

## 2014-07-07 NOTE — Procedures (Signed)
PreOperative Dx: :EFT thyroid nodule Postoperative Dx: LEFT thyroid nosule Procedure:   US guided FNA of LEFT thyroid nodule Radiologist:  Thornton Papas Anesthesia:  2  ml of 1% lidocaine Specimen:  FNA x 3  EBL:   < 1 ml Complications: None

## 2014-08-10 ENCOUNTER — Other Ambulatory Visit: Payer: Self-pay | Admitting: Family Medicine

## 2014-08-17 ENCOUNTER — Other Ambulatory Visit: Payer: Self-pay | Admitting: Family Medicine

## 2014-08-26 ENCOUNTER — Other Ambulatory Visit: Payer: Self-pay | Admitting: Family Medicine

## 2014-08-30 DIAGNOSIS — E1142 Type 2 diabetes mellitus with diabetic polyneuropathy: Secondary | ICD-10-CM | POA: Diagnosis not present

## 2014-08-30 DIAGNOSIS — L851 Acquired keratosis [keratoderma] palmaris et plantaris: Secondary | ICD-10-CM | POA: Diagnosis not present

## 2014-08-30 DIAGNOSIS — B351 Tinea unguium: Secondary | ICD-10-CM | POA: Diagnosis not present

## 2014-09-29 ENCOUNTER — Other Ambulatory Visit: Payer: Self-pay | Admitting: Family Medicine

## 2014-10-20 DIAGNOSIS — E1340 Other specified diabetes mellitus with diabetic neuropathy, unspecified: Secondary | ICD-10-CM | POA: Diagnosis not present

## 2014-10-21 LAB — MICROALBUMIN / CREATININE URINE RATIO
Creatinine, Urine: 164.1 mg/dL
Microalb Creat Ratio: 4.3 mg/g (ref 0.0–30.0)
Microalb, Ur: 0.7 mg/dL (ref <2.0)

## 2014-10-21 LAB — BASIC METABOLIC PANEL WITH GFR
BUN: 11 mg/dL (ref 7–25)
CO2: 31 mmol/L (ref 20–31)
Calcium: 9.4 mg/dL (ref 8.6–10.4)
Chloride: 97 mmol/L — ABNORMAL LOW (ref 98–110)
Creat: 0.62 mg/dL (ref 0.60–0.93)
GFR, Est African American: >89 mL/min (ref >=60)
GFR, Est Non African American: 86 mL/min (ref >=60)
Glucose, Bld: 102 mg/dL — ABNORMAL HIGH (ref 65–99)
Potassium: 4.3 mmol/L (ref 3.5–5.3)
Sodium: 137 mmol/L (ref 135–146)

## 2014-10-21 LAB — HEMOGLOBIN A1C
Hgb A1c MFr Bld: 6.4 % — ABNORMAL HIGH (ref <5.7)
Mean Plasma Glucose: 137 mg/dL — ABNORMAL HIGH (ref <117)

## 2014-10-24 ENCOUNTER — Other Ambulatory Visit: Payer: Self-pay | Admitting: Family Medicine

## 2014-10-27 ENCOUNTER — Encounter: Payer: Self-pay | Admitting: Family Medicine

## 2014-10-27 ENCOUNTER — Ambulatory Visit (INDEPENDENT_AMBULATORY_CARE_PROVIDER_SITE_OTHER): Payer: Medicare Other | Admitting: Family Medicine

## 2014-10-27 VITALS — BP 122/80 | HR 89 | Resp 16 | Ht 63.0 in | Wt 185.1 lb

## 2014-10-27 DIAGNOSIS — E559 Vitamin D deficiency, unspecified: Secondary | ICD-10-CM

## 2014-10-27 DIAGNOSIS — E0840 Diabetes mellitus due to underlying condition with diabetic neuropathy, unspecified: Secondary | ICD-10-CM

## 2014-10-27 DIAGNOSIS — Z23 Encounter for immunization: Secondary | ICD-10-CM

## 2014-10-27 DIAGNOSIS — Z1211 Encounter for screening for malignant neoplasm of colon: Secondary | ICD-10-CM

## 2014-10-27 DIAGNOSIS — Z0000 Encounter for general adult medical examination without abnormal findings: Secondary | ICD-10-CM | POA: Diagnosis not present

## 2014-10-27 DIAGNOSIS — I10 Essential (primary) hypertension: Secondary | ICD-10-CM

## 2014-10-27 DIAGNOSIS — M79601 Pain in right arm: Secondary | ICD-10-CM

## 2014-10-27 DIAGNOSIS — M25511 Pain in right shoulder: Secondary | ICD-10-CM

## 2014-10-27 DIAGNOSIS — E785 Hyperlipidemia, unspecified: Secondary | ICD-10-CM

## 2014-10-27 DIAGNOSIS — E790 Hyperuricemia without signs of inflammatory arthritis and tophaceous disease: Secondary | ICD-10-CM

## 2014-10-27 LAB — POC HEMOCCULT BLD/STL (OFFICE/1-CARD/DIAGNOSTIC): Fecal Occult Blood, POC: NEGATIVE

## 2014-10-27 MED ORDER — GABAPENTIN 100 MG PO CAPS: 100.0000 mg | ORAL_CAPSULE | Freq: Every day | ORAL | Status: DC

## 2014-10-27 MED ORDER — VITAMIN D (ERGOCALCIFEROL) 1.25 MG (50000 UNIT) PO CAPS: 50000.0000 [IU] | ORAL_CAPSULE | ORAL | Status: DC

## 2014-10-27 NOTE — Addendum Note (Signed)
Addended by: Eual Fines on: 10/27/2014 09:32 AM   Modules accepted: Orders

## 2014-10-27 NOTE — Assessment & Plan Note (Signed)

## 2014-10-27 NOTE — Patient Instructions (Addendum)
F/u in 4 month, call if you need me sooner Congrats on excellent exam and lab resyults  Flu vaccine today  You are referred for eye exam  Your script is sent for diabetic shoes  Fasting lipid, cmp and EGFr hBA1C, Uric acid , CBC, tSH and Vit D in 4 months  New medications gabapentin for right arm pain  Weekly Viot D3 for bone and muscle health  Please work on good  health habits so that your health will improve. 1. Commitment to daily physical activity for 30 to 60  minutes, if you are able to do this.  2. Commitment to wise food choices. Aim for half of your  food intake to be vegetable and fruit, one quarter starchy foods, and one quarter protein. Try to eat on a regular schedule  3 meals per day, snacking between meals should be limited to vegetables or fruits or small portions of nuts. 64 ounces of water per day is generally recommended, unless you have specific health conditions, like heart failure or kidney failure where you will need to limit fluid intake.  3. Commitment to sufficient and a  good quality of physical and mental rest daily, generally between 6 to 8 hours per day.  WITH PERSISTANCE AND PERSEVERANCE, THE IMPOSSIBLE , BECOMES THE NORM!  Thanks for choosing [facility], we consider it a privelige to serve you.

## 2014-10-27 NOTE — Assessment & Plan Note (Signed)
Uncontrolled , start gabapentin at bedtime 

## 2014-10-27 NOTE — Progress Notes (Signed)
   Subjective:    Patient ID: Lisa Mann, female    DOB: 07-12-1933, 81 y.o.   MRN: [id number]  HPI Patient is in for annual physical exam. Pain in RUE  Rated at a 6. Recent labs, if available are reviewed. Immunization is reviewed , and  updated if needed.    Assessment:     Plan:       Review of Systems See HPI     Objective:   Physical Exam  BP 122/80 mmHg  Pulse 89  Resp 16  Ht 5\' 3"  (1.6 m)  Wt 185 lb 1.9 oz (83.97 kg)  BMI 32.80 kg/m2  SpO2 100%  Pleasant well nourished female, alert and oriented x 3, in no cardio-pulmonary distress. Afebrile. HEENT No facial trauma or asymetry. Sinuses non tender.  Extra occullar muscles intact, pupils equally reactive to light. External ears normal, tympanic membranes clear. Oropharynx moist, no exudate, good dentition. Neck: supple, no adenopathy,JVD or thyromegaly.No bruits.  Chest: Clear to ascultation bilaterally.No crackles or wheezes. Non tender to palpation  Breast: Bilateral mastectomy, surgical scars well  healed . No tenderness. . No axillary or supraclavicular adenopathy  Cardiovascular system; Heart sounds normal,  S1 and  S2 ,no S3.  No murmur, or thrill. Apical beat not displaced Peripheral pulses normal.  Abdomen: Soft, non tender, no organomegaly or masses. No bruits. Bowel sounds normal. No guarding, tenderness or rebound.  Rectal:  Normal sphincter tone. No mass.No rectal masses.  Guaiac negative stool.  GU: External genitalia normal female genitalia , female distribution of hair. No lesions. Urethral meatus normal in size, no  Prolapse, no lesions visibly  Present. Bladder non tender. Vagina pink and moist , with no visible lesions , discharge present . Adequate absent, no adnexal masses, no  adnexal tenderness.   Musculoskeletal exam: Adequate though reduced ROM of spine, hips , shoulders and knees. No deformity ,swelling or crepitus noted. No muscle wasting or atrophy.    Neurologic: Cranial nerves 2 to 12 intact. Power, tone , and reflexes normal throughout. No disturbance in gait. No tremor.  Skin: Intact, no ulceration, erythema , scaling or rash noted. Pigmentation normal throughout  Psych; Normal mood and affect. Judgement and concentration normal      Assessment & Plan:  Annual physical exam Annual exam as documented. Counseling done  re healthy lifestyle involving commitment to 150 minutes exercise per week, heart healthy diet, and attaining healthy weight.The importance of adequate sleep also discussed. Regular seat belt use and home safety, is also discussed. Changes in health habits are decided on by the patient with goals and time frames  set for achieving them. Immunization and cancer screening needs are specifically addressed at this visit.   Right arm pain Uncontrolled , start gabapentin at bedtime

## 2014-11-07 DIAGNOSIS — H1851 Endothelial corneal dystrophy: Secondary | ICD-10-CM | POA: Diagnosis not present

## 2014-11-07 DIAGNOSIS — H04123 Dry eye syndrome of bilateral lacrimal glands: Secondary | ICD-10-CM | POA: Diagnosis not present

## 2014-11-07 DIAGNOSIS — E119 Type 2 diabetes mellitus without complications: Secondary | ICD-10-CM | POA: Diagnosis not present

## 2014-11-07 DIAGNOSIS — H2513 Age-related nuclear cataract, bilateral: Secondary | ICD-10-CM | POA: Diagnosis not present

## 2014-11-07 LAB — HM DIABETES EYE EXAM

## 2014-11-11 DIAGNOSIS — E1142 Type 2 diabetes mellitus with diabetic polyneuropathy: Secondary | ICD-10-CM | POA: Diagnosis not present

## 2014-11-11 DIAGNOSIS — L851 Acquired keratosis [keratoderma] palmaris et plantaris: Secondary | ICD-10-CM | POA: Diagnosis not present

## 2014-11-11 DIAGNOSIS — B351 Tinea unguium: Secondary | ICD-10-CM | POA: Diagnosis not present
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
## 2014-11-21 NOTE — Assessment & Plan Note (Addendum)
Bilateral triple negative breast cancers treated at [facility].  ONCOLOGY HISTORY: 10/27/2006: LEFT BREAST dcis, MARGIN LESS THAN 1 MM, POSTOP MAMMOSITE THERAPY. 05/2008: LEFT BREAST BIOPSY WITH INVASIVE DUCTAL CARCINOMA. 11/03/2008 BILATERAL MASTECTOMY WITH SENTINEL NODE BIOPSY  Right: pT1 N0 triple negative, DCIS plus LCIS  Left: pT1 N0 triple negative invasive ductal carcinoma. Chemotherapy: Doxorubicin/Cytoxan 4 cycles followed by Taxol weekly ending on 06/12/2009. DIAGNOSIS AND [place] 1. DCIS of the left breast treated with lumpectomy on 10/27/06 with margin <13mm followed by Mammosite  2. In 05/2008 had a concerning abnormality in the left breast which was biopsy proven invasive adenocarcinoma. A mastectomy was recommended and she elected to go for a second opinion. Repeat mammogram in 09/2008 revealed another concerning lesion in the right breast which was biopsied and was invasive ductal carcinoma, ER/PR negative with an Mib-1 of 93%. 3. She underwent bilateral mastectomy and sentinel node biopsy on 11/03/2008. Pathology from the right breast mastectomy revealed a pT1N0 which was triple negative with a high Mib score with associated high-grade ductal carcinoma in situ with lobular extension. Left breast mastectomy showed triple negative invasive grade II ductal adenocarcinoma pT1N0.  4. Received adjuvant chemotherapy with 4 cycle of AC followed by 12 doses of weekly Taxol under the care of Dr Carlyon Prows, completed 06/12/2009   Labs today as ordered.  Labs in 1 year: CBC diff, CMET.  Last bone density was normal in August 2013.  This is followed by Dr. Moshe Cipro.  Return in 1 year for follow-up.

## 2014-11-23 ENCOUNTER — Encounter (HOSPITAL_COMMUNITY): Payer: Self-pay | Admitting: Oncology

## 2014-11-23 ENCOUNTER — Encounter (HOSPITAL_COMMUNITY): Payer: Medicare Other | Attending: Oncology

## 2014-11-23 ENCOUNTER — Encounter (HOSPITAL_BASED_OUTPATIENT_CLINIC_OR_DEPARTMENT_OTHER): Payer: Medicare Other | Admitting: Oncology

## 2014-11-23 ENCOUNTER — Ambulatory Visit (HOSPITAL_COMMUNITY): Payer: Medicare Other | Admitting: Hematology & Oncology

## 2014-11-23 VITALS — BP 148/59 | HR 98 | Temp 97.6°F | Resp 18 | Wt 186.0 lb

## 2014-11-23 DIAGNOSIS — Z853 Personal history of malignant neoplasm of breast: Secondary | ICD-10-CM

## 2014-11-23 DIAGNOSIS — C50912 Malignant neoplasm of unspecified site of left female breast: Secondary | ICD-10-CM | POA: Diagnosis not present

## 2014-11-23 DIAGNOSIS — C50911 Malignant neoplasm of unspecified site of right female breast: Secondary | ICD-10-CM | POA: Diagnosis not present

## 2014-11-23 LAB — CBC WITH DIFFERENTIAL/PLATELET
Basophils Absolute: 0 10*3/uL (ref 0.0–0.1)
Basophils Relative: 1 %
Eosinophils Absolute: 0.2 10*3/uL (ref 0.0–0.7)
Eosinophils Relative: 3 %
HCT: 38.7 % (ref 36.0–46.0)
Hemoglobin: 12.8 g/dL (ref 12.0–15.0)
Lymphocytes Relative: 38 %
Lymphs Abs: 1.9 10*3/uL (ref 0.7–4.0)
MCH: 29.1 pg (ref 26.0–34.0)
MCHC: 33.1 g/dL (ref 30.0–36.0)
MCV: 88 fL (ref 78.0–100.0)
Monocytes Absolute: 0.5 10*3/uL (ref 0.1–1.0)
Monocytes Relative: 9 %
Neutro Abs: 2.4 10*3/uL (ref 1.7–7.7)
Neutrophils Relative %: 49 %
Platelets: 330 10*3/uL (ref 150–400)
RBC: 4.4 MIL/uL (ref 3.87–5.11)
RDW: 14.3 % (ref 11.5–15.5)
WBC: 5 10*3/uL (ref 4.0–10.5)

## 2014-11-23 LAB — COMPREHENSIVE METABOLIC PANEL
ALT: 25 U/L (ref 14–54)
AST: 30 U/L (ref 15–41)
Albumin: 4.2 g/dL (ref 3.5–5.0)
Alkaline Phosphatase: 47 U/L (ref 38–126)
Anion gap: 10 (ref 5–15)
BUN: 13 mg/dL (ref 6–20)
CO2: 28 mmol/L (ref 22–32)
Calcium: 9.6 mg/dL (ref 8.9–10.3)
Chloride: 100 mmol/L — ABNORMAL LOW (ref 101–111)
Creatinine, Ser: 0.58 mg/dL (ref 0.44–1.00)
GFR calc Af Amer: >60 mL/min (ref >60)
GFR calc non Af Amer: >60 mL/min (ref >60)
Glucose, Bld: 144 mg/dL — ABNORMAL HIGH (ref 65–99)
Potassium: 4 mmol/L (ref 3.5–5.1)
Sodium: 138 mmol/L (ref 135–145)
Total Bilirubin: 0.4 mg/dL (ref 0.3–1.2)
Total Protein: 7.5 g/dL (ref 6.5–8.1)

## 2014-11-23 NOTE — Patient Instructions (Addendum)
..  [place] at [facility] Discharge Instructions  RECOMMENDATIONS MADE BY THE CONSULTANT AND ANY TEST RESULTS WILL BE SENT TO YOUR REFERRING PHYSICIAN.  F/U Dr. Moshe Cipro as directed Return in 12 months for f/u and labs   Thank you for choosing [place] at [facility] to provide your oncology and hematology care.  To afford each patient quality time with our provider, please arrive at least 15 minutes before your scheduled appointment time.    You need to re-schedule your appointment should you arrive 10 or more minutes late.  We strive to give you quality time with our providers, and arriving late affects you and other patients whose appointments are after yours.  Also, if you no show three or more times for appointments you may be dismissed from the clinic at the providers discretion.     Again, thank you for choosing [facility].  Our hope is that these requests will decrease the amount of time that you wait before being seen by our physicians.       _____________________________________________________________  Should you have questions after your visit to [facility], please contact our office at [phone] between the hours of 8:30 a.m. and 4:30 p.m.  Voicemails left after 4:30 p.m. will not be returned until the following business day.  For prescription refill requests, have your pharmacy contact our office.

## 2014-11-24 LAB — CANCER ANTIGEN 27.29: CA 27.29: 11 U/mL (ref 0.0–38.6)

## 2014-11-24 LAB — CEA: CEA: 1.7 ng/mL (ref 0.0–4.7)

## 2014-11-24 NOTE — Progress Notes (Signed)
LABS DRAWN

## 2014-12-12 ENCOUNTER — Other Ambulatory Visit: Payer: Self-pay | Admitting: Family Medicine

## 2014-12-29 ENCOUNTER — Other Ambulatory Visit: Payer: Self-pay | Admitting: Family Medicine

## 2015-01-21 ENCOUNTER — Other Ambulatory Visit: Payer: Self-pay | Admitting: Family Medicine

## 2015-01-27 DIAGNOSIS — L851 Acquired keratosis [keratoderma] palmaris et plantaris: Secondary | ICD-10-CM | POA: Diagnosis not present

## 2015-01-27 DIAGNOSIS — B351 Tinea unguium: Secondary | ICD-10-CM | POA: Diagnosis not present

## 2015-01-27 DIAGNOSIS — E1142 Type 2 diabetes mellitus with diabetic polyneuropathy: Secondary | ICD-10-CM | POA: Diagnosis not present

## 2015-02-06 DIAGNOSIS — E0840 Diabetes mellitus due to underlying condition with diabetic neuropathy, unspecified: Secondary | ICD-10-CM | POA: Diagnosis not present

## 2015-02-06 DIAGNOSIS — E785 Hyperlipidemia, unspecified: Secondary | ICD-10-CM | POA: Diagnosis not present

## 2015-02-06 DIAGNOSIS — E790 Hyperuricemia without signs of inflammatory arthritis and tophaceous disease: Secondary | ICD-10-CM | POA: Diagnosis not present

## 2015-02-06 DIAGNOSIS — E559 Vitamin D deficiency, unspecified: Secondary | ICD-10-CM | POA: Diagnosis not present

## 2015-02-06 DIAGNOSIS — R799 Abnormal finding of blood chemistry, unspecified: Secondary | ICD-10-CM | POA: Diagnosis not present

## 2015-02-06 DIAGNOSIS — I10 Essential (primary) hypertension: Secondary | ICD-10-CM | POA: Diagnosis not present

## 2015-02-06 LAB — CBC WITH DIFFERENTIAL/PLATELET
Basophils Absolute: 0 10*3/uL (ref 0.0–0.1)
Basophils Relative: 0 % (ref 0–1)
Eosinophils Absolute: 0.2 10*3/uL (ref 0.0–0.7)
Eosinophils Relative: 3 % (ref 0–5)
HCT: 38.6 % (ref 36.0–46.0)
Hemoglobin: 13 g/dL (ref 12.0–15.0)
Lymphocytes Relative: 35 % (ref 12–46)
Lymphs Abs: 1.8 10*3/uL (ref 0.7–4.0)
MCH: 29.1 pg (ref 26.0–34.0)
MCHC: 33.7 g/dL (ref 30.0–36.0)
MCV: 86.4 fL (ref 78.0–100.0)
MPV: 8.7 fL (ref 8.6–12.4)
Monocytes Absolute: 0.6 10*3/uL (ref 0.1–1.0)
Monocytes Relative: 11 % (ref 3–12)
Neutro Abs: 2.6 10*3/uL (ref 1.7–7.7)
Neutrophils Relative %: 51 % (ref 43–77)
Platelets: 302 10*3/uL (ref 150–400)
RBC: 4.47 MIL/uL (ref 3.87–5.11)
RDW: 15.3 % (ref 11.5–15.5)
WBC: 5.1 10*3/uL (ref 4.0–10.5)

## 2015-02-06 LAB — COMPLETE METABOLIC PANEL WITH GFR
ALT: 18 U/L (ref 6–29)
AST: 19 U/L (ref 10–35)
Albumin: 4.2 g/dL (ref 3.6–5.1)
Alkaline Phosphatase: 44 U/L (ref 33–130)
BUN: 17 mg/dL (ref 7–25)
CO2: 30 mmol/L (ref 20–31)
Calcium: 10.1 mg/dL (ref 8.6–10.4)
Chloride: 96 mmol/L — ABNORMAL LOW (ref 98–110)
Creat: 0.59 mg/dL — ABNORMAL LOW (ref 0.60–0.93)
GFR, Est African American: >89 mL/min (ref >=60)
GFR, Est Non African American: 87 mL/min (ref >=60)
Glucose, Bld: 109 mg/dL — ABNORMAL HIGH (ref 65–99)
Potassium: 4.2 mmol/L (ref 3.5–5.3)
Sodium: 136 mmol/L (ref 135–146)
Total Bilirubin: 0.4 mg/dL (ref 0.2–1.2)
Total Protein: 7.2 g/dL (ref 6.1–8.1)

## 2015-02-06 LAB — URIC ACID: Uric Acid, Serum: 2.8 mg/dL (ref 2.4–7.0)

## 2015-02-06 LAB — LIPID PANEL
Cholesterol: 165 mg/dL (ref 125–200)
HDL: 48 mg/dL (ref >=46)
LDL Cholesterol: 93 mg/dL (ref <130)
Total CHOL/HDL Ratio: 3.4 Ratio (ref <=5.0)
Triglycerides: 122 mg/dL (ref <150)
VLDL: 24 mg/dL (ref <30)

## 2015-02-06 LAB — TSH: TSH: 0.357 u[IU]/mL (ref 0.350–4.500)

## 2015-02-07 ENCOUNTER — Other Ambulatory Visit: Payer: Self-pay | Admitting: Family Medicine

## 2015-02-07 LAB — VITAMIN D 25 HYDROXY (VIT D DEFICIENCY, FRACTURES): Vit D, 25-Hydroxy: 39 ng/mL (ref 30–100)

## 2015-02-07 LAB — HEMOGLOBIN A1C
Hgb A1c MFr Bld: 6.6 % — ABNORMAL HIGH (ref <5.7)
Mean Plasma Glucose: 143 mg/dL — ABNORMAL HIGH (ref <117)

## 2015-02-13 ENCOUNTER — Encounter: Payer: Self-pay | Admitting: Family Medicine

## 2015-02-13 ENCOUNTER — Ambulatory Visit (INDEPENDENT_AMBULATORY_CARE_PROVIDER_SITE_OTHER): Payer: Medicare Other | Admitting: Family Medicine

## 2015-02-13 VITALS — BP 136/70 | HR 92 | Resp 18 | Ht 63.0 in | Wt 185.0 lb

## 2015-02-13 DIAGNOSIS — E0840 Diabetes mellitus due to underlying condition with diabetic neuropathy, unspecified: Secondary | ICD-10-CM

## 2015-02-13 DIAGNOSIS — I10 Essential (primary) hypertension: Secondary | ICD-10-CM

## 2015-02-13 DIAGNOSIS — M542 Cervicalgia: Secondary | ICD-10-CM

## 2015-02-13 DIAGNOSIS — E785 Hyperlipidemia, unspecified: Secondary | ICD-10-CM

## 2015-02-13 DIAGNOSIS — M79601 Pain in right arm: Secondary | ICD-10-CM

## 2015-02-13 DIAGNOSIS — E669 Obesity, unspecified: Secondary | ICD-10-CM

## 2015-02-13 MED ORDER — METFORMIN HCL 1000 MG PO TABS: ORAL_TABLET | ORAL | Status: DC

## 2015-02-13 MED ORDER — ATORVASTATIN CALCIUM 10 MG PO TABS: 10.0000 mg | ORAL_TABLET | Freq: Every day | ORAL | Status: DC

## 2015-02-13 MED ORDER — OMEPRAZOLE 40 MG PO CPDR: DELAYED_RELEASE_CAPSULE | ORAL | Status: DC

## 2015-02-13 MED ORDER — TRIAMTERENE-HCTZ 75-50 MG PO TABS: 1.0000 | ORAL_TABLET | Freq: Every day | ORAL | Status: DC

## 2015-02-13 MED ORDER — ALLOPURINOL 300 MG PO TABS: ORAL_TABLET | ORAL | Status: DC

## 2015-02-13 NOTE — Progress Notes (Signed)
Subjective:    Patient ID: Lisa Mann, female    DOB: Apr 26, 1933, 82 y.o.   MRN: [id number]  HPI    Lisa Mann     MRN: [id number]      DOB: 03/12/1933   HPI Lisa Mann is here for follow up and re-evaluation of chronic medical conditions, medication management and review of any available recent lab and radiology data.  Preventive health is updated, specifically  Cancer screening and Immunization.   . The PT denies any adverse reactions to current medications since the last visit.  C/o left arm pain intermittent from neck , denies weakness or numbness in extremity ROS Denies recent fever or chills. Denies sinus pressure, nasal congestion, ear pain or sore throat. Denies chest congestion, productive cough or wheezing. Denies chest pains, palpitations and leg swelling Denies abdominal pain, nausea, vomiting,diarrhea or constipation.   Denies dysuria, frequency, hesitancy or incontinence. Denies joint pain, swelling and limitation in mobility. Denies headaches, seizures, numbness, or tingling. Denies depression, anxiety or insomnia. Denies skin break down or rash.   PE  BP 136/70 mmHg  Pulse 92  Resp 18  Ht 5\' 3"  (1.6 m)  Wt 185 lb (83.915 kg)  BMI 32.78 kg/m2  SpO2 95%  Patient alert and oriented and in no cardiopulmonary distress.  HEENT: No facial asymmetry, EOMI,   oropharynx pink and moist.  Neck supple no JVD, no mass.  Chest: Clear to auscultation bilaterally.  CVS: S1, S2 no murmurs, no S3.Regular rate.  ABD: Soft non tender.   Ext: No edema  MS: Adequate ROM spine, shoulders, hips and knees.  Skin: Intact, no ulcerations or rash noted.  Psych: Good eye contact, normal affect. Memory intact not anxious or depressed appearing.  CNS: CN 2-12 intact, power,  normal throughout.no focal deficits noted.   Assessment & Plan   HTN, goal below 130/80 Controlled, no change in medication DASH diet and commitment to daily physical activity for a  minimum of 30 minutes discussed and encouraged, as a part of hypertension management. The importance of attaining a healthy weight is also discussed.  BP/Weight 02/13/2015 11/23/2014 10/27/2014 07/07/2014 05/26/2014 Q000111Q XX123456  Systolic BP XX123456 123456 [id number] 123456 123456 - 123456  Diastolic BP 70 59 80 83 74 - 70  Wt. (Lbs) 185 186 185.12 - 184 185 185.4  BMI 32.78 32.96 32.8 - 32.6 32.78 31.81        Diabetes mellitus with neuropathy Controlled, no change in medication Lisa Mann is reminded of the importance of commitment to daily physical activity for 30 minutes or more, as able and the need to limit carbohydrate intake to 30 to 60 grams per meal to help with blood sugar control.   The need to take medication as prescribed, test blood sugar as directed, and to call between visits if there is a concern that blood sugar is uncontrolled is also discussed.   Lisa Mann is reminded of the importance of daily foot exam, annual eye examination, and good blood sugar, blood pressure and cholesterol control.  Diabetic Labs Latest Ref Rng 02/06/2015 11/23/2014 10/20/2014 05/06/2014 12/06/2013  HbA1c <5.7 % 6.6(H) - 6.4(H) 6.7(H) 6.7(H)  Microalbumin <2.0 mg/dL - - 0.7 - -  Micro/Creat Ratio 0.0 - 30.0 mg/g - - 4.3 - -  Chol 125 - 200 mg/dL 165 - - 165 138  HDL >=46 mg/dL 48 - - 52 42  Calc LDL <130 mg/dL 93 - - 96 84  Triglycerides <150 mg/dL 122 - -  85 59  Creatinine 0.60 - 0.93 mg/dL 0.59(L) 0.58 0.62 0.65 0.61   BP/Weight 02/13/2015 11/23/2014 10/27/2014 07/07/2014 05/26/2014 Q000111Q [id number]  Systolic BP XX123456 123456 [id number] 123456 123456 - 123456  Diastolic BP 70 59 80 83 74 - 70  Wt. (Lbs) 185 186 185.12 - 184 185 185.4  BMI 32.78 32.96 32.8 - 32.6 32.78 31.81   Foot/eye exam completion dates Latest Ref Rng 11/07/2014 10/27/2014  Eye Exam No Retinopathy No Retinopathy -  Foot exam Order - - -  Foot Form Completion - - Done         Hyperlipidemia with target LDL less than 100 Hyperlipidemia:Low fat diet  discussed and encouraged.   Lipid Panel  Lab Results  Component Value Date   CHOL 165 02/06/2015   HDL 48 02/06/2015   LDLCALC 93 02/06/2015   TRIG 122 02/06/2015   CHOLHDL 3.4 02/06/2015    Controlled, no change in medication     Right arm pain Chronic and unchanged, uses topical med with some relief, and gabapentin as needed. Advised her to monitor for weakness or numbness as she has severe spondylosis and disc disease in C spine  Neck pain on right side Unchanged and moderate at most a 4, uses gabapentin as needed only  Obesity (BMI 30.0-34.9) Deteriorated. Patient re-educated about  the importance of commitment to a  minimum of 150 minutes of exercise per week.  The importance of healthy food choices with portion control discussed. Encouraged to start a food diary, count calories and to consider  joining a support group. Sample diet sheets offered. Goals set by the patient for the next several months.   Weight /BMI 02/13/2015 11/23/2014 10/27/2014  WEIGHT 185 lb 186 lb 185 lb 1.9 oz  HEIGHT 5\' 3"  - 5\' 3"   BMI 32.78 kg/m2 32.96 kg/m2 32.8 kg/m2    Current exercise per week 60 minutes.      Review of Systems     Objective:   Physical Exam        Assessment & Plan:

## 2015-02-13 NOTE — Assessment & Plan Note (Signed)
Unchanged and moderate at most a 4, uses gabapentin as needed only

## 2015-02-13 NOTE — Assessment & Plan Note (Signed)
Hyperlipidemia:Low fat diet discussed and encouraged.   Lipid Panel  Lab Results  Component Value Date   CHOL 165 02/06/2015   HDL 48 02/06/2015   LDLCALC 93 02/06/2015   TRIG 122 02/06/2015   CHOLHDL 3.4 02/06/2015    Controlled, no change in medication

## 2015-02-13 NOTE — Patient Instructions (Addendum)
F/u in 6 month, call,if you need me before  Excellent blood  Pressure, exam and labs  No changes in medication  Chair exercises will be given to you  Fasting lipid, cmp and eGFr and hBa1C in 5 month, 3 weeks  Thanks for choosing [facility], we consider it a privelige to serve you.  All the best!  If you start having weakness or numbness in right arm, need to calll for return to neurosurgeon due to severe arthritis in neck and pinched nerve  

## 2015-02-13 NOTE — Assessment & Plan Note (Signed)
Controlled, no change in medication DASH diet and commitment to daily physical activity for a minimum of 30 minutes discussed and encouraged, as a part of hypertension management. The importance of attaining a healthy weight is also discussed.  BP/Weight 02/13/2015 11/23/2014 10/27/2014 07/07/2014 05/26/2014 Q000111Q [id number]  Systolic BP XX123456 123456 [id number] 123456 123456 - 123456  Diastolic BP 70 59 80 83 74 - 70  Wt. (Lbs) 185 186 185.12 - 184 185 185.4  BMI 32.78 32.96 32.8 - 32.6 32.78 31.81

## 2015-02-13 NOTE — Assessment & Plan Note (Signed)
Chronic and unchanged, uses topical med with some relief, and gabapentin as needed. Advised her to monitor for weakness or numbness as she has severe spondylosis and disc disease in C spine

## 2015-02-13 NOTE — Assessment & Plan Note (Signed)
Deteriorated. Patient re-educated about  the importance of commitment to a  minimum of 150 minutes of exercise per week.  The importance of healthy food choices with portion control discussed. Encouraged to start a food diary, count calories and to consider  joining a support group. Sample diet sheets offered. Goals set by the patient for the next several months.   Weight /BMI 02/13/2015 11/23/2014 10/27/2014  WEIGHT 185 lb 186 lb 185 lb 1.9 oz  HEIGHT 5\' 3"  - 5\' 3"   BMI 32.78 kg/m2 32.96 kg/m2 32.8 kg/m2    Current exercise per week 60 minutes.

## 2015-02-13 NOTE — Assessment & Plan Note (Signed)
Controlled, no change in medication Lisa Mann is reminded of the importance of commitment to daily physical activity for 30 minutes or more, as able and the need to limit carbohydrate intake to 30 to 60 grams per meal to help with blood sugar control.   The need to take medication as prescribed, test blood sugar as directed, and to call between visits if there is a concern that blood sugar is uncontrolled is also discussed.   Lisa Mann is reminded of the importance of daily foot exam, annual eye examination, and good blood sugar, blood pressure and cholesterol control.  Diabetic Labs Latest Ref Rng 02/06/2015 11/23/2014 10/20/2014 05/06/2014 12/06/2013  HbA1c <5.7 % 6.6(H) - 6.4(H) 6.7(H) 6.7(H)  Microalbumin <2.0 mg/dL - - 0.7 - -  Micro/Creat Ratio 0.0 - 30.0 mg/g - - 4.3 - -  Chol 125 - 200 mg/dL 165 - - 165 138  HDL >=46 mg/dL 48 - - 52 42  Calc LDL <130 mg/dL 93 - - 96 84  Triglycerides <150 mg/dL 122 - - 85 59  Creatinine 0.60 - 0.93 mg/dL 0.59(L) 0.58 0.62 0.65 0.61   BP/Weight 02/13/2015 11/23/2014 10/27/2014 07/07/2014 05/26/2014 Q000111Q [id number]  Systolic BP XX123456 123456 [id number] 123456 123456 - 123456  Diastolic BP 70 59 80 83 74 - 70  Wt. (Lbs) 185 186 185.12 - 184 185 185.4  BMI 32.78 32.96 32.8 - 32.6 32.78 31.81   Foot/eye exam completion dates Latest Ref Rng 11/07/2014 10/27/2014  Eye Exam No Retinopathy No Retinopathy -  Foot exam Order - - -  Foot Form Completion - - Done

## 2015-02-20 ENCOUNTER — Other Ambulatory Visit: Payer: Self-pay | Admitting: Family Medicine

## 2015-03-28 ENCOUNTER — Other Ambulatory Visit: Payer: Self-pay | Admitting: Family Medicine

## 2015-03-31 ENCOUNTER — Other Ambulatory Visit: Payer: Self-pay | Admitting: Family Medicine

## 2015-04-03 ENCOUNTER — Other Ambulatory Visit: Payer: Self-pay | Admitting: Family Medicine

## 2015-04-04 DIAGNOSIS — B351 Tinea unguium: Secondary | ICD-10-CM | POA: Diagnosis not present

## 2015-04-04 DIAGNOSIS — E1142 Type 2 diabetes mellitus with diabetic polyneuropathy: Secondary | ICD-10-CM | POA: Diagnosis not present

## 2015-04-04 DIAGNOSIS — L851 Acquired keratosis [keratoderma] palmaris et plantaris: Secondary | ICD-10-CM | POA: Diagnosis not present

## 2015-04-20 ENCOUNTER — Other Ambulatory Visit: Payer: Self-pay | Admitting: Family Medicine

## 2015-05-09 ENCOUNTER — Other Ambulatory Visit: Payer: Self-pay | Admitting: Family Medicine

## 2015-06-09 ENCOUNTER — Other Ambulatory Visit (INDEPENDENT_AMBULATORY_CARE_PROVIDER_SITE_OTHER): Payer: Self-pay | Admitting: Otolaryngology

## 2015-06-09 DIAGNOSIS — E041 Nontoxic single thyroid nodule: Secondary | ICD-10-CM

## 2015-06-12 ENCOUNTER — Other Ambulatory Visit: Payer: Self-pay | Admitting: Family Medicine

## 2015-06-15 ENCOUNTER — Ambulatory Visit (HOSPITAL_COMMUNITY): Payer: Medicare Other

## 2015-06-15 ENCOUNTER — Ambulatory Visit (HOSPITAL_COMMUNITY)
Admission: RE | Admit: 2015-06-15 | Discharge: 2015-06-15 | Disposition: A | Discharge status: Home / Self Care | Payer: Medicare Other | Source: Ambulatory Visit | Attending: Otolaryngology | Admitting: Otolaryngology

## 2015-06-15 DIAGNOSIS — E041 Nontoxic single thyroid nodule: Secondary | ICD-10-CM | POA: Insufficient documentation

## 2015-06-15 DIAGNOSIS — E010 Iodine-deficiency related diffuse (endemic) goiter: Secondary | ICD-10-CM | POA: Diagnosis not present

## 2015-06-20 DIAGNOSIS — E1142 Type 2 diabetes mellitus with diabetic polyneuropathy: Secondary | ICD-10-CM | POA: Diagnosis not present

## 2015-06-20 DIAGNOSIS — B351 Tinea unguium: Secondary | ICD-10-CM | POA: Diagnosis not present

## 2015-06-20 DIAGNOSIS — L851 Acquired keratosis [keratoderma] palmaris et plantaris: Secondary | ICD-10-CM | POA: Diagnosis not present

## 2015-06-29 ENCOUNTER — Ambulatory Visit (INDEPENDENT_AMBULATORY_CARE_PROVIDER_SITE_OTHER): Payer: Medicare Other | Admitting: Otolaryngology

## 2015-06-29 DIAGNOSIS — D440 Neoplasm of uncertain behavior of thyroid gland: Secondary | ICD-10-CM

## 2015-07-27 ENCOUNTER — Other Ambulatory Visit: Payer: Self-pay | Admitting: Family Medicine

## 2015-08-10 ENCOUNTER — Other Ambulatory Visit: Payer: Self-pay | Admitting: Family Medicine

## 2015-08-10 DIAGNOSIS — E785 Hyperlipidemia, unspecified: Secondary | ICD-10-CM | POA: Diagnosis not present

## 2015-08-10 DIAGNOSIS — E0840 Diabetes mellitus due to underlying condition with diabetic neuropathy, unspecified: Secondary | ICD-10-CM | POA: Diagnosis not present

## 2015-08-11 LAB — COMPLETE METABOLIC PANEL WITH GFR
ALT: 28 U/L (ref 6–29)
AST: 24 U/L (ref 10–35)
Albumin: 4 g/dL (ref 3.6–5.1)
Alkaline Phosphatase: 46 U/L (ref 33–130)
BUN: 18 mg/dL (ref 7–25)
CO2: 26 mmol/L (ref 20–31)
Calcium: 9.6 mg/dL (ref 8.6–10.4)
Chloride: 98 mmol/L (ref 98–110)
Creat: 0.69 mg/dL (ref 0.60–0.93)
GFR, Est African American: >89 mL/min (ref >=60)
GFR, Est Non African American: 83 mL/min (ref >=60)
Glucose, Bld: 115 mg/dL — ABNORMAL HIGH (ref 65–99)
Potassium: 4.3 mmol/L (ref 3.5–5.3)
Sodium: 137 mmol/L (ref 135–146)
Total Bilirubin: 0.3 mg/dL (ref 0.2–1.2)
Total Protein: 7.1 g/dL (ref 6.1–8.1)

## 2015-08-11 LAB — LIPID PANEL
Cholesterol: 166 mg/dL (ref 125–200)
HDL: 53 mg/dL (ref >=46)
LDL Cholesterol: 92 mg/dL (ref <130)
Total CHOL/HDL Ratio: 3.1 Ratio (ref <=5.0)
Triglycerides: 104 mg/dL (ref <150)
VLDL: 21 mg/dL (ref <30)

## 2015-08-11 LAB — HEMOGLOBIN A1C
Hgb A1c MFr Bld: 6.9 % — ABNORMAL HIGH (ref <5.7)
Mean Plasma Glucose: 151 mg/dL

## 2015-08-14 ENCOUNTER — Ambulatory Visit (INDEPENDENT_AMBULATORY_CARE_PROVIDER_SITE_OTHER): Payer: Medicare Other | Admitting: Family Medicine

## 2015-08-14 ENCOUNTER — Ambulatory Visit (HOSPITAL_COMMUNITY)
Admission: RE | Admit: 2015-08-14 | Discharge: 2015-08-14 | Disposition: A | Discharge status: Home / Self Care | Payer: Medicare Other | Source: Ambulatory Visit | Attending: Family Medicine | Admitting: Family Medicine

## 2015-08-14 ENCOUNTER — Other Ambulatory Visit: Payer: Self-pay

## 2015-08-14 ENCOUNTER — Encounter: Payer: Self-pay | Admitting: Family Medicine

## 2015-08-14 ENCOUNTER — Encounter (INDEPENDENT_AMBULATORY_CARE_PROVIDER_SITE_OTHER): Payer: Self-pay

## 2015-08-14 VITALS — BP 138/80 | HR 92 | Resp 16 | Ht 63.0 in | Wt 186.0 lb

## 2015-08-14 DIAGNOSIS — Z0000 Encounter for general adult medical examination without abnormal findings: Secondary | ICD-10-CM

## 2015-08-14 DIAGNOSIS — R600 Localized edema: Secondary | ICD-10-CM | POA: Diagnosis not present

## 2015-08-14 DIAGNOSIS — M79605 Pain in left leg: Secondary | ICD-10-CM | POA: Insufficient documentation

## 2015-08-14 DIAGNOSIS — M79604 Pain in right leg: Secondary | ICD-10-CM | POA: Insufficient documentation

## 2015-08-14 DIAGNOSIS — R609 Edema, unspecified: Secondary | ICD-10-CM | POA: Insufficient documentation

## 2015-08-14 MED ORDER — ATORVASTATIN CALCIUM 10 MG PO TABS: ORAL_TABLET | ORAL | 1 refills | Status: DC

## 2015-08-14 MED ORDER — METFORMIN HCL 1000 MG PO TABS: ORAL_TABLET | ORAL | 1 refills | Status: DC

## 2015-08-14 MED ORDER — RAMIPRIL 10 MG PO CAPS: 10.0000 mg | ORAL_CAPSULE | Freq: Every day | ORAL | 1 refills | Status: DC

## 2015-08-14 MED ORDER — POTASSIUM CHLORIDE CRYS ER 20 MEQ PO TBCR: 20.0000 meq | EXTENDED_RELEASE_TABLET | Freq: Every day | ORAL | 1 refills | Status: DC

## 2015-08-14 MED ORDER — TRIAMTERENE-HCTZ 75-50 MG PO TABS: 1.0000 | ORAL_TABLET | Freq: Every day | ORAL | 1 refills | Status: DC

## 2015-08-14 MED ORDER — ALLOPURINOL 300 MG PO TABS: ORAL_TABLET | ORAL | 1 refills | Status: DC

## 2015-08-14 NOTE — Progress Notes (Signed)
   Lisa Mann     MRN: [id number]      DOB: 1933/01/12

## 2015-08-14 NOTE — Assessment & Plan Note (Signed)
Rates at a 5 s/p fall 3 weeks ago, still bruised, no difficulty with ambulation, x ray tib/fib, tylenol for pain Fall risk reduction education

## 2015-08-14 NOTE — Progress Notes (Signed)
Preventive Screening-Counseling & Management   Patient present here today for a Medicare annual wellness visit. C/o right leg pain s/p fall 3 weeks ago Rated at a 5 Recent labs are reviewed and are excellent   Current Problems (verified)   Medications Prior to Visit Allergies (verified)   PAST HISTORY  Family History  Social History Widow x 6 years, mom of 2 living children   Risk Factors  Current exercise habits:none    Dietary issues discussed:low carb , low fat   Cardiac risk factors: diabetes  Depression Screen  (Note: if answer to either of the following is "Yes", a more complete depression screening is indicated)   Over the past two weeks, have you felt down, depressed or hopeless? No  Over the past two weeks, have you felt little interest or pleasure in doing things? No  Have you lost interest or pleasure in daily life? No  Do you often feel hopeless? No  Do you cry easily over simple problems? No   Activities of Daily Living  In your present state of health, do you have any difficulty performing the following activities?  Driving?: No Managing money?: No Feeding yourself?:No Getting from bed to chair?:No Climbing a flight of stairs?:No Preparing food and eating?:No Bathing or showering?:No Getting dressed?:No Getting to the toilet?:No Using the toilet?:No Moving around from place to place?: No  Fall Risk Assessment In the past year have you fallen or had a near fall?:yes, fell 3 weeks  Are you currently taking any medications that make you dizzy?:No   Hearing Difficulties: No Do you often ask people to speak up or repeat themselves?:No Do you experience ringing or noises in your ears?:No Do you have difficulty understanding soft or whispered voices?:No  Cognitive Testing  Alert? Yes Normal Appearance?Yes  Oriented to person? Yes Place? Yes  Time? Yes  Displays appropriate judgment?Yes  Can read the correct time from a watch face? yes Are you  having problems remembering things?No  Advanced Directives have been discussed with the patient?Yes , full code   List the Names of Other Physician/Practitioners you currently use: Dr Katy Fitch, Whitney Muse   Indicate any recent Medical Services you may have received from other than [facility] providers in the past year (date may be approximate).     Medicare Attestation  I have personally reviewed:  The patient's medical and social history  Their use of alcohol, tobacco or illicit drugs  Their current medications and supplements  The patient's functional ability including ADLs,fall risks, home safety risks, cognitive, and hearing and visual impairment  Diet and physical activities  Evidence for depression or mood disorders  The patient's weight, height, BMI, and visual acuity have been recorded in the chart. I have made referrals, counseling, and provided education to the patient based on review of the above and I have provided the patient with a written personalized care plan for preventive services.    Physical Exam BP 138/80   Pulse 92   Resp 16   Ht 5\' 3"  (1.6 m)   Wt 186 lb (84.4 kg)   SpO2 97%   BMI 32.95 kg/m    Assessment & Plan:  Medicare annual wellness visit, subsequent Annual exam as documented. Counseling done  re healthy lifestyle involving commitment to 150 minutes exercise per week, heart healthy diet, and attaining healthy weight.The importance of adequate sleep also discussed. Regular seat belt use and home safety, is also discussed. Changes in health habits are decided on by the patient with  goals and time frames  set for achieving them. Immunization and cancer screening needs are specifically addressed at this visit.   Left leg pain Rates at a 5 s/p fall 3 weeks ago, still bruised, no difficulty with ambulation, x ray tib/fib, tylenol for pain Fall risk reduction education

## 2015-08-14 NOTE — Assessment & Plan Note (Signed)

## 2015-08-14 NOTE — Patient Instructions (Addendum)
F/u with rectal in 4 n months, call if you need me sooner  Start atending senior citizens   Commit to exercise, crafts, bingo!  Please no more falls!  X ray of right leg today  Come in September for flu vaccine  HAPPY  Birthday when it comes!   Fall Prevention in the Home  Falls can cause injuries. They can happen to people of all ages. There are many things you can do to make your home safe and to help prevent falls.  WHAT CAN I DO ON THE OUTSIDE OF MY HOME?  Regularly fix the edges of walkways and driveways and fix any cracks.  Remove anything that might make you trip as you walk through a door, such as a raised step or threshold.  Trim any bushes or trees on the path to your home.  Use bright outdoor lighting.  Clear any walking paths of anything that might make someone trip, such as rocks or tools.  Regularly check to see if handrails are loose or broken. Make sure that both sides of any steps have handrails.  Any raised decks and porches should have guardrails on the edges.  Have any leaves, snow, or ice cleared regularly.  Use sand or salt on walking paths during winter.  Clean up any spills in your garage right away. This includes oil or grease spills. WHAT CAN I DO IN THE BATHROOM?   Use night lights.  Install grab bars by the toilet and in the tub and shower. Do not use towel bars as grab bars.  Use non-skid mats or decals in the tub or shower.  If you need to sit down in the shower, use a plastic, non-slip stool.  Keep the floor dry. Clean up any water that spills on the floor as soon as it happens.  Remove soap buildup in the tub or shower regularly.  Attach bath mats securely with double-sided non-slip rug tape.  Do not have throw rugs and other things on the floor that can make you trip. WHAT CAN I DO IN THE BEDROOM?  Use night lights.  Make sure that you have a light by your bed that is easy to reach.  Do not use any sheets or blankets that  are too big for your bed. They should not hang down onto the floor.  Have a firm chair that has side arms. You can use this for support while you get dressed.  Do not have throw rugs and other things on the floor that can make you trip. WHAT CAN I DO IN THE KITCHEN?  Clean up any spills right away.  Avoid walking on wet floors.  Keep items that you use a lot in easy-to-reach places.  If you need to reach something above you, use a strong step stool that has a grab bar.  Keep electrical cords out of the way.  Do not use floor polish or wax that makes floors slippery. If you must use wax, use non-skid floor wax.  Do not have throw rugs and other things on the floor that can make you trip. WHAT CAN I DO WITH MY STAIRS?  Do not leave any items on the stairs.  Make sure that there are handrails on both sides of the stairs and use them. Fix handrails that are broken or loose. Make sure that handrails are as long as the stairways.  Check any carpeting to make sure that it is firmly attached to the stairs. Fix any carpet  that is loose or worn.  Avoid having throw rugs at the top or bottom of the stairs. If you do have throw rugs, attach them to the floor with carpet tape.  Make sure that you have a light switch at the top of the stairs and the bottom of the stairs. If you do not have them, ask someone to add them for you. WHAT ELSE CAN I DO TO HELP PREVENT FALLS?  Wear shoes that:  Do not have high heels.  Have rubber bottoms.  Are comfortable and fit you well.  Are closed at the toe. Do not wear sandals.  If you use a stepladder:  Make sure that it is fully opened. Do not climb a closed stepladder.  Make sure that both sides of the stepladder are locked into place.  Ask someone to hold it for you, if possible.  Clearly mark and make sure that you can see:  Any grab bars or handrails.  First and last steps.  Where the edge of each step is.  Use tools that help you  move around (mobility aids) if they are needed. These include:  Canes.  Walkers.  Scooters.  Crutches.  Turn on the lights when you go into a dark area. Replace any light bulbs as soon as they burn out.  Set up your furniture so you have a clear path. Avoid moving your furniture around.  If any of your floors are uneven, fix them.  If there are any pets around you, be aware of where they are.  Review your medicines with your doctor. Some medicines can make you feel dizzy. This can increase your chance of falling. Ask your doctor what other things that you can do to help prevent falls.   This information is not intended to replace advice given to you by your health care provider. Make sure you discuss any questions you have with your health care provider.   Document Released: 10/20/2008 Document Revised: 05/10/2014 Document Reviewed: 01/28/2014 Elsevier Interactive Patient Education [employer].

## 2015-08-29 DIAGNOSIS — B351 Tinea unguium: Secondary | ICD-10-CM | POA: Diagnosis not present

## 2015-08-29 DIAGNOSIS — E1142 Type 2 diabetes mellitus with diabetic polyneuropathy: Secondary | ICD-10-CM | POA: Diagnosis not present

## 2015-08-29 DIAGNOSIS — L851 Acquired keratosis [keratoderma] palmaris et plantaris: Secondary | ICD-10-CM | POA: Diagnosis not present

## 2015-10-18 ENCOUNTER — Other Ambulatory Visit: Payer: Self-pay | Admitting: Family Medicine

## 2015-11-07 DIAGNOSIS — B351 Tinea unguium: Secondary | ICD-10-CM | POA: Diagnosis not present

## 2015-11-07 DIAGNOSIS — E1142 Type 2 diabetes mellitus with diabetic polyneuropathy: Secondary | ICD-10-CM | POA: Diagnosis not present

## 2015-11-07 DIAGNOSIS — L851 Acquired keratosis [keratoderma] palmaris et plantaris: Secondary | ICD-10-CM | POA: Diagnosis not present

## 2015-11-08 DIAGNOSIS — H1851 Endothelial corneal dystrophy: Secondary | ICD-10-CM | POA: Diagnosis not present

## 2015-11-08 DIAGNOSIS — H2513 Age-related nuclear cataract, bilateral: Secondary | ICD-10-CM | POA: Diagnosis not present

## 2015-11-08 DIAGNOSIS — E119 Type 2 diabetes mellitus without complications: Secondary | ICD-10-CM | POA: Diagnosis not present

## 2015-11-08 DIAGNOSIS — H04123 Dry eye syndrome of bilateral lacrimal glands: Secondary | ICD-10-CM | POA: Diagnosis not present

## 2015-11-08 LAB — HM DIABETES EYE EXAM

## 2015-11-22 ENCOUNTER — Encounter (HOSPITAL_BASED_OUTPATIENT_CLINIC_OR_DEPARTMENT_OTHER): Payer: Medicare Other | Admitting: Hematology & Oncology

## 2015-11-22 ENCOUNTER — Ambulatory Visit (HOSPITAL_COMMUNITY): Payer: Medicare Other

## 2015-11-22 ENCOUNTER — Other Ambulatory Visit: Payer: Self-pay | Admitting: Family Medicine

## 2015-11-22 ENCOUNTER — Encounter (HOSPITAL_COMMUNITY): Payer: Self-pay | Admitting: Hematology & Oncology

## 2015-11-22 ENCOUNTER — Encounter (HOSPITAL_COMMUNITY): Payer: Medicare Other | Attending: Hematology & Oncology

## 2015-11-22 VITALS — BP 122/56 | HR 87 | Temp 97.5°F | Resp 16 | Wt 183.4 lb

## 2015-11-22 DIAGNOSIS — Z23 Encounter for immunization: Secondary | ICD-10-CM

## 2015-11-22 DIAGNOSIS — Z853 Personal history of malignant neoplasm of breast: Secondary | ICD-10-CM | POA: Diagnosis not present

## 2015-11-22 DIAGNOSIS — C50911 Malignant neoplasm of unspecified site of right female breast: Secondary | ICD-10-CM

## 2015-11-22 DIAGNOSIS — Z171 Estrogen receptor negative status [ER-]: Secondary | ICD-10-CM | POA: Diagnosis not present

## 2015-11-22 DIAGNOSIS — Z9013 Acquired absence of bilateral breasts and nipples: Secondary | ICD-10-CM | POA: Diagnosis not present

## 2015-11-22 DIAGNOSIS — C50912 Malignant neoplasm of unspecified site of left female breast: Secondary | ICD-10-CM

## 2015-11-22 LAB — CBC WITH DIFFERENTIAL/PLATELET
Basophils Absolute: 0 10*3/uL (ref 0.0–0.1)
Basophils Relative: 0 %
Eosinophils Absolute: 0.1 10*3/uL (ref 0.0–0.7)
Eosinophils Relative: 2 %
HCT: 39.7 % (ref 36.0–46.0)
Hemoglobin: 13.5 g/dL (ref 12.0–15.0)
Lymphocytes Relative: 42 %
Lymphs Abs: 2.2 10*3/uL (ref 0.7–4.0)
MCH: 29.8 pg (ref 26.0–34.0)
MCHC: 34 g/dL (ref 30.0–36.0)
MCV: 87.6 fL (ref 78.0–100.0)
Monocytes Absolute: 0.3 10*3/uL (ref 0.1–1.0)
Monocytes Relative: 6 %
Neutro Abs: 2.5 10*3/uL (ref 1.7–7.7)
Neutrophils Relative %: 50 %
Platelets: 291 10*3/uL (ref 150–400)
RBC: 4.53 MIL/uL (ref 3.87–5.11)
RDW: 15.2 % (ref 11.5–15.5)
WBC: 5.1 10*3/uL (ref 4.0–10.5)

## 2015-11-22 LAB — COMPREHENSIVE METABOLIC PANEL
ALT: 28 U/L (ref 14–54)
AST: 34 U/L (ref 15–41)
Albumin: 4.4 g/dL (ref 3.5–5.0)
Alkaline Phosphatase: 42 U/L (ref 38–126)
Anion gap: 11 (ref 5–15)
BUN: 18 mg/dL (ref 6–20)
CO2: 25 mmol/L (ref 22–32)
Calcium: 9.8 mg/dL (ref 8.9–10.3)
Chloride: 99 mmol/L — ABNORMAL LOW (ref 101–111)
Creatinine, Ser: 0.77 mg/dL (ref 0.44–1.00)
GFR calc Af Amer: >60 mL/min (ref >60)
GFR calc non Af Amer: >60 mL/min (ref >60)
Glucose, Bld: 106 mg/dL — ABNORMAL HIGH (ref 65–99)
Potassium: 4 mmol/L (ref 3.5–5.1)
Sodium: 135 mmol/L (ref 135–145)
Total Bilirubin: 0.6 mg/dL (ref 0.3–1.2)
Total Protein: 7.8 g/dL (ref 6.5–8.1)

## 2015-11-22 MED ORDER — INFLUENZA VAC SPLIT QUAD 0.5 ML IM SUSY
PREFILLED_SYRINGE | INTRAMUSCULAR | Status: AC
  Filled 2015-11-22: qty 0.5

## 2015-11-22 MED ORDER — INFLUENZA VAC SPLIT QUAD 0.5 ML IM SUSY
0.5000 mL | PREFILLED_SYRINGE | Freq: Once | INTRAMUSCULAR | Status: AC
  Administered 2015-11-22: 0.5 mL via INTRAMUSCULAR

## 2015-11-22 NOTE — Patient Instructions (Addendum)
[place] at [facility] Discharge Instructions  RECOMMENDATIONS MADE BY THE CONSULTANT AND ANY TEST RESULTS WILL BE SENT TO YOUR REFERRING PHYSICIAN.  You saw Dr.Penland today. Flu shot given today. Return in 6 months for follow up and labs. See Amy at checkout for appointments.   Thank you for choosing [place] at [facility] to provide your oncology and hematology care.  To afford each patient quality time with our provider, please arrive at least 15 minutes before your scheduled appointment time.   Beginning January 23rd 2017 lab work for the [facility] will be done in the  Main lab at [facility] on 1st floor. If you have a lab appointment with the [facility] please come in thru the  Main Entrance and check in at the main information desk  You need to re-schedule your appointment should you arrive 10 or more minutes late.  We strive to give you quality time with our providers, and arriving late affects you and other patients whose appointments are after yours.  Also, if you no show three or more times for appointments you may be dismissed from the clinic at the providers discretion.     Again, thank you for choosing [facility].  Our hope is that these requests will decrease the amount of time that you wait before being seen by our physicians.       _____________________________________________________________  Should you have questions after your visit to [facility], please contact our office at [phone] between the hours of 8:30 a.m. and 4:30 p.m.  Voicemails left after 4:30 p.m. will not be returned until the following business day.  For prescription refill requests, have your pharmacy contact our office.         Resources For Cancer Patients and their Caregivers ? American Cancer Society: Can assist with transportation, wigs, general needs, runs Look Good Feel Better.         445-621-2050 ? Cancer Care: Provides financial assistance, online support groups, medication/co-pay assistance.  1-800-813-HOPE (770) 659-3229) ? [facility] Assists [place] cancer patients and their families through emotional , educational and financial support.  (978)012-3943 ? [place] DSS Where to apply for food stamps, Medicaid and utility assistance. (332)859-4277 ? [facility]: Transportation to medical appointments. (781)619-3182 ? Social Security Administration: May apply for disability if have a Stage IV cancer. (574) 184-0940 6082152817 ? [employer], Disability and Transit Services: Assists with nutrition, care and transit needs. [place] Support Programs: @10RELATIVEDAYS @ > Cancer Support Group  2nd Tuesday of the month 1pm-2pm, Journey Room  > Creative Journey  3rd Tuesday of the month 1130am-1pm, Journey Room  > Look Good Feel Better  1st Wednesday of the month 10am-12 noon, Journey Room (Call [place] to register [phone])
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
## 2015-11-22 NOTE — Progress Notes (Signed)
Pt given flu shot in left deltoid. PT tolerated well. Pt stable and discharged home ambulatory.

## 2015-11-23 ENCOUNTER — Encounter (HOSPITAL_COMMUNITY): Payer: Self-pay | Admitting: Hematology & Oncology

## 2015-12-14 ENCOUNTER — Encounter: Payer: Self-pay | Admitting: Family Medicine

## 2015-12-14 ENCOUNTER — Ambulatory Visit (INDEPENDENT_AMBULATORY_CARE_PROVIDER_SITE_OTHER): Payer: Medicare Other | Admitting: Family Medicine

## 2015-12-14 VITALS — BP 120/62 | HR 94 | Resp 16 | Ht 63.0 in | Wt 187.0 lb

## 2015-12-14 DIAGNOSIS — E0840 Diabetes mellitus due to underlying condition with diabetic neuropathy, unspecified: Secondary | ICD-10-CM

## 2015-12-14 DIAGNOSIS — I10 Essential (primary) hypertension: Secondary | ICD-10-CM | POA: Diagnosis not present

## 2015-12-14 DIAGNOSIS — E669 Obesity, unspecified: Secondary | ICD-10-CM | POA: Diagnosis not present

## 2015-12-14 DIAGNOSIS — M109 Gout, unspecified: Secondary | ICD-10-CM

## 2015-12-14 DIAGNOSIS — E559 Vitamin D deficiency, unspecified: Secondary | ICD-10-CM

## 2015-12-14 DIAGNOSIS — E785 Hyperlipidemia, unspecified: Secondary | ICD-10-CM

## 2015-12-14 DIAGNOSIS — Z1211 Encounter for screening for malignant neoplasm of colon: Secondary | ICD-10-CM

## 2015-12-14 DIAGNOSIS — J302 Other seasonal allergic rhinitis: Secondary | ICD-10-CM

## 2015-12-14 LAB — POC HEMOCCULT BLD/STL (OFFICE/1-CARD/DIAGNOSTIC): Fecal Occult Blood, POC: NEGATIVE

## 2015-12-14 MED ORDER — VITAMIN D (ERGOCALCIFEROL) 1.25 MG (50000 UNIT) PO CAPS: ORAL_CAPSULE | ORAL | 5 refills | Status: DC

## 2015-12-14 MED ORDER — OMEPRAZOLE 40 MG PO CPDR: DELAYED_RELEASE_CAPSULE | ORAL | 1 refills | Status: DC

## 2015-12-14 MED ORDER — ALLOPURINOL 300 MG PO TABS: ORAL_TABLET | ORAL | 1 refills | Status: DC

## 2015-12-14 MED ORDER — METFORMIN HCL 1000 MG PO TABS: ORAL_TABLET | ORAL | 1 refills | Status: DC

## 2015-12-14 MED ORDER — RAMIPRIL 10 MG PO CAPS: 10.0000 mg | ORAL_CAPSULE | Freq: Every day | ORAL | 1 refills | Status: DC

## 2015-12-14 MED ORDER — ATORVASTATIN CALCIUM 10 MG PO TABS: ORAL_TABLET | ORAL | 1 refills | Status: DC

## 2015-12-14 NOTE — Progress Notes (Signed)
Lisa Mann     MRN: [id number]      DOB: 03-Sep-1933   HPI Lisa Mann is here for follow up and re-evaluation of chronic medical conditions, medication management and review of any available recent lab and radiology data.  Preventive health is updated, specifically  Cancer screening and Immunization.   Questions or concerns regarding consultations or procedures which the PT has had in the interim are  addressed. The PT denies any adverse reactions to current medications since the last visit.  There are no new concerns.  There are no specific complaints  Denies polyuria, polydipsia, blurred vision , or hypoglycemic episodes.   ROS Denies recent fever or chills. Denies sinus pressure, nasal congestion, ear pain or sore throat. Denies chest congestion, productive cough or wheezing. Denies chest pains, palpitations and leg swelling Denies abdominal pain, nausea, vomiting,diarrhea or constipation.   Denies dysuria, frequency, hesitancy or incontinence. Denies joint pain, swelling and limitation in mobility. Denies headaches, seizures, numbness, or tingling. Denies depression, anxiety or insomnia. Denies skin break down or rash.   PE  BP 120/62   Pulse 94   Resp 16   Ht 5\' 3"  (1.6 m)   Wt 187 lb (84.8 kg)   SpO2 96%   BMI 33.13 kg/m   Patient alert and oriented and in no cardiopulmonary distress.  HEENT: No facial asymmetry, EOMI,   oropharynx pink and moist.  Neck supple no JVD, no mass.  Chest: Clear to auscultation bilaterally.  CVS: S1, S2 no murmurs, no S3.Regular rate.  ABD: Soft non tender. No organomegaly or mass, normal BS Rectal: no mass, heme negative stool Ext: No edema  MS: Adequate ROM spine, shoulders, hips and knees.  Skin: Intact, no ulcerations or rash noted.  Psych: Good eye contact, normal affect. Memory intact not anxious or depressed appearing.  CNS: CN 2-12 intact, power,  normal throughout.no focal deficits noted.   Assessment &  Plan  HTN, goal below 130/80 Controlled, no change in medication DASH diet and commitment to daily physical activity for a minimum of 30 minutes discussed and encouraged, as a part of hypertension management. The importance of attaining a healthy weight is also discussed.  BP/Weight 12/14/2015 11/22/2015 08/14/2015 02/13/2015 11/23/2014 10/27/2014 0000000  Systolic BP 123456 [id number] 0000000 [id number] 123456 [id number] 123456  Diastolic BP 62 56 80 70 59 80 83  Wt. (Lbs) 187 183.4 186 185 186 185.12 -  BMI 33.13 32.49 32.95 32.78 32.96 32.8 -       Diabetes mellitus with neuropathy Controlled, no change in medication Lisa Mann is reminded of the importance of commitment to daily physical activity for 30 minutes or more, as able and the need to limit carbohydrate intake to 30 to 60 grams per meal to help with blood sugar control.   The need to take medication as prescribed, test blood sugar as directed, and to call between visits if there is a concern that blood sugar is uncontrolled is also discussed.   Lisa Mann is reminded of the importance of daily foot exam, annual eye examination, and good blood sugar, blood pressure and cholesterol control.  Diabetic Labs Latest Ref Rng & Units 11/22/2015 08/10/2015 02/06/2015 11/23/2014 10/20/2014  HbA1c <5.7 % - 6.9(H) 6.6(H) - 6.4(H)  Microalbumin <2.0 mg/dL - - - - 0.7  Micro/Creat Ratio 0.0 - 30.0 mg/g - - - - 4.3  Chol 125 - 200 mg/dL - 166 165 - -  HDL >=46 mg/dL - 53 48 - -  Calc LDL <130 mg/dL - 92 93 - -  Triglycerides <150 mg/dL - 104 122 - -  Creatinine 0.44 - 1.00 mg/dL 0.77 0.69 0.59(L) 0.58 0.62   BP/Weight 12/14/2015 11/22/2015 08/14/2015 02/13/2015 11/23/2014 10/27/2014 0000000  Systolic BP 123456 [id number] 0000000 [id number] 123456 [id number] 123456  Diastolic BP 62 56 80 70 59 80 83  Wt. (Lbs) 187 183.4 186 185 186 185.12 -  BMI 33.13 32.49 32.95 32.78 32.96 32.8 -   Foot/eye exam completion dates Latest Ref Rng & Units 12/14/2015 11/07/2014  Eye Exam No Retinopathy - No Retinopathy   Foot exam Order - - -  Foot Form Completion - Done -        Obesity (BMI 30.0-34.9) Deteriorated. Patient re-educated about  the importance of commitment to a  minimum of 150 minutes of exercise per week.  The importance of healthy food choices with portion control discussed. Encouraged to start a food diary, count calories and to consider  joining a support group. Sample diet sheets offered. Goals set by the patient for the next several months.   Weight /BMI 12/14/2015 11/22/2015 08/14/2015  WEIGHT 187 lb 183 lb 6.4 oz 186 lb  HEIGHT 5\' 3"  - 5\' 3"   BMI 33.13 kg/m2 32.49 kg/m2 32.95 kg/m2      Allergic rhinitis Controlled, no change in medication   Hyperlipidemia with target LDL less than 100 Controlled, no change in medication Hyperlipidemia:Low fat diet discussed and encouraged.   Lipid Panel  Lab Results  Component Value Date   CHOL 166 08/10/2015   HDL 53 08/10/2015   LDLCALC 92 08/10/2015   TRIG 104 08/10/2015   CHOLHDL 3.1 08/10/2015  Updated lab needed at/ before next visit.

## 2015-12-14 NOTE — Assessment & Plan Note (Signed)
Deteriorated. Patient re-educated about  the importance of commitment to a  minimum of 150 minutes of exercise per week.  The importance of healthy food choices with portion control discussed. Encouraged to start a food diary, count calories and to consider  joining a support group. Sample diet sheets offered. Goals set by the patient for the next several months.   Weight /BMI 12/14/2015 11/22/2015 08/14/2015  WEIGHT 187 lb 183 lb 6.4 oz 186 lb  HEIGHT 5\' 3"  - 5\' 3"   BMI 33.13 kg/m2 32.49 kg/m2 32.95 kg/m2

## 2015-12-14 NOTE — Assessment & Plan Note (Signed)
Controlled, no change in medication Ms. Loafman is reminded of the importance of commitment to daily physical activity for 30 minutes or more, as able and the need to limit carbohydrate intake to 30 to 60 grams per meal to help with blood sugar control.   The need to take medication as prescribed, test blood sugar as directed, and to call between visits if there is a concern that blood sugar is uncontrolled is also discussed.   Ms. Meanor is reminded of the importance of daily foot exam, annual eye examination, and good blood sugar, blood pressure and cholesterol control.  Diabetic Labs Latest Ref Rng & Units 11/22/2015 08/10/2015 02/06/2015 11/23/2014 10/20/2014  HbA1c <5.7 % - 6.9(H) 6.6(H) - 6.4(H)  Microalbumin <2.0 mg/dL - - - - 0.7  Micro/Creat Ratio 0.0 - 30.0 mg/g - - - - 4.3  Chol 125 - 200 mg/dL - 166 165 - -  HDL >=46 mg/dL - 53 48 - -  Calc LDL <130 mg/dL - 92 93 - -  Triglycerides <150 mg/dL - 104 122 - -  Creatinine 0.44 - 1.00 mg/dL 0.77 0.69 0.59(L) 0.58 0.62   BP/Weight 12/14/2015 11/22/2015 08/14/2015 02/13/2015 11/23/2014 10/27/2014 0000000  Systolic BP 123456 [id number] 0000000 [id number] 123456 [id number] 123456  Diastolic BP 62 56 80 70 59 80 83  Wt. (Lbs) 187 183.4 186 185 186 185.12 -  BMI 33.13 32.49 32.95 32.78 32.96 32.8 -   Foot/eye exam completion dates Latest Ref Rng & Units 12/14/2015 11/07/2014  Eye Exam No Retinopathy - No Retinopathy  Foot exam Order - - -  Foot Form Completion - Done -

## 2015-12-14 NOTE — Patient Instructions (Signed)
F/U mid March, call if you need me sooner  Excellent exam and labs today, no hidden blood in stool  It is important that you exercise regularly at least 30 minutes 5 times a week. If you develop chest pain, have severe difficulty breathing, or feel very tired, stop exercising immediately and seek medical attention    Fasting lipid, cmp and EGFR, hBA1C, Vit Dand uric acid level mid March  Thanks for choosing [facility], we consider it a privelige to serve you.

## 2015-12-14 NOTE — Assessment & Plan Note (Signed)
Controlled, no change in medication DASH diet and commitment to daily physical activity for a minimum of 30 minutes discussed and encouraged, as a part of hypertension management. The importance of attaining a healthy weight is also discussed.  BP/Weight 12/14/2015 11/22/2015 08/14/2015 02/13/2015 11/23/2014 10/27/2014 0000000  Systolic BP 123456 [id number] 0000000 [id number] 123456 [id number] 123456  Diastolic BP 62 56 80 70 59 80 83  Wt. (Lbs) 187 183.4 186 185 186 185.12 -  BMI 33.13 32.49 32.95 32.78 32.96 32.8 -

## 2015-12-17 NOTE — Assessment & Plan Note (Signed)
Controlled, no change in medication Hyperlipidemia:Low fat diet discussed and encouraged.   Lipid Panel  Lab Results  Component Value Date   CHOL 166 08/10/2015   HDL 53 08/10/2015   LDLCALC 92 08/10/2015   TRIG 104 08/10/2015   CHOLHDL 3.1 08/10/2015  Updated lab needed at/ before next visit.

## 2015-12-17 NOTE — Assessment & Plan Note (Signed)
Controlled, no change in medication  

## 2015-12-29 ENCOUNTER — Encounter (HOSPITAL_COMMUNITY): Payer: Self-pay | Admitting: Hematology & Oncology

## 2016-01-16 DIAGNOSIS — L851 Acquired keratosis [keratoderma] palmaris et plantaris: Secondary | ICD-10-CM | POA: Diagnosis not present

## 2016-01-16 DIAGNOSIS — B351 Tinea unguium: Secondary | ICD-10-CM | POA: Diagnosis not present

## 2016-01-16 DIAGNOSIS — E1142 Type 2 diabetes mellitus with diabetic polyneuropathy: Secondary | ICD-10-CM | POA: Diagnosis not present

## 2016-02-09 ENCOUNTER — Encounter: Payer: Self-pay | Admitting: Internal Medicine

## 2016-03-20 DIAGNOSIS — E785 Hyperlipidemia, unspecified: Secondary | ICD-10-CM | POA: Diagnosis not present

## 2016-03-20 DIAGNOSIS — E559 Vitamin D deficiency, unspecified: Secondary | ICD-10-CM | POA: Diagnosis not present

## 2016-03-20 DIAGNOSIS — E0840 Diabetes mellitus due to underlying condition with diabetic neuropathy, unspecified: Secondary | ICD-10-CM | POA: Diagnosis not present

## 2016-03-20 DIAGNOSIS — M109 Gout, unspecified: Secondary | ICD-10-CM | POA: Diagnosis not present

## 2016-03-21 LAB — COMPLETE METABOLIC PANEL WITH GFR
ALT: 33 U/L — ABNORMAL HIGH (ref 6–29)
AST: 27 U/L (ref 10–35)
Albumin: 4.1 g/dL (ref 3.6–5.1)
Alkaline Phosphatase: 39 U/L (ref 33–130)
BUN: 12 mg/dL (ref 7–25)
CO2: 22 mmol/L (ref 20–31)
Calcium: 9.5 mg/dL (ref 8.6–10.4)
Chloride: 99 mmol/L (ref 98–110)
Creat: 0.58 mg/dL — ABNORMAL LOW (ref 0.60–0.88)
GFR, Est African American: >89 mL/min (ref >=60)
GFR, Est Non African American: 87 mL/min (ref >=60)
Glucose, Bld: 124 mg/dL — ABNORMAL HIGH (ref 65–99)
Potassium: 4.4 mmol/L (ref 3.5–5.3)
Sodium: 139 mmol/L (ref 135–146)
Total Bilirubin: 0.4 mg/dL (ref 0.2–1.2)
Total Protein: 6.6 g/dL (ref 6.1–8.1)

## 2016-03-21 LAB — VITAMIN D 25 HYDROXY (VIT D DEFICIENCY, FRACTURES): Vit D, 25-Hydroxy: 44 ng/mL (ref 30–100)

## 2016-03-21 LAB — LIPID PANEL
Cholesterol: 159 mg/dL (ref <200)
HDL: 49 mg/dL — ABNORMAL LOW (ref >50)
LDL Cholesterol: 78 mg/dL (ref <100)
Total CHOL/HDL Ratio: 3.2 Ratio (ref <5.0)
Triglycerides: 161 mg/dL — ABNORMAL HIGH (ref <150)
VLDL: 32 mg/dL — ABNORMAL HIGH (ref <30)

## 2016-03-21 LAB — HEMOGLOBIN A1C
Hgb A1c MFr Bld: 6.5 % — ABNORMAL HIGH (ref <5.7)
Mean Plasma Glucose: 140 mg/dL

## 2016-03-21 LAB — URIC ACID: Uric Acid, Serum: 3.2 mg/dL (ref 2.5–7.0)

## 2016-03-26 ENCOUNTER — Ambulatory Visit (INDEPENDENT_AMBULATORY_CARE_PROVIDER_SITE_OTHER): Payer: Medicare Other | Admitting: Family Medicine

## 2016-03-26 ENCOUNTER — Encounter: Payer: Self-pay | Admitting: Family Medicine

## 2016-03-26 VITALS — BP 120/78 | HR 79 | Resp 15 | Ht 63.0 in | Wt 187.0 lb

## 2016-03-26 DIAGNOSIS — E1040 Type 1 diabetes mellitus with diabetic neuropathy, unspecified: Secondary | ICD-10-CM | POA: Diagnosis not present

## 2016-03-26 DIAGNOSIS — K219 Gastro-esophageal reflux disease without esophagitis: Secondary | ICD-10-CM | POA: Diagnosis not present

## 2016-03-26 DIAGNOSIS — E669 Obesity, unspecified: Secondary | ICD-10-CM | POA: Diagnosis not present

## 2016-03-26 DIAGNOSIS — E785 Hyperlipidemia, unspecified: Secondary | ICD-10-CM

## 2016-03-26 DIAGNOSIS — L851 Acquired keratosis [keratoderma] palmaris et plantaris: Secondary | ICD-10-CM | POA: Diagnosis not present

## 2016-03-26 DIAGNOSIS — Z23 Encounter for immunization: Secondary | ICD-10-CM

## 2016-03-26 DIAGNOSIS — I10 Essential (primary) hypertension: Secondary | ICD-10-CM

## 2016-03-26 DIAGNOSIS — M65341 Trigger finger, right ring finger: Secondary | ICD-10-CM | POA: Diagnosis not present

## 2016-03-26 DIAGNOSIS — E1142 Type 2 diabetes mellitus with diabetic polyneuropathy: Secondary | ICD-10-CM | POA: Diagnosis not present

## 2016-03-26 DIAGNOSIS — B351 Tinea unguium: Secondary | ICD-10-CM | POA: Diagnosis not present

## 2016-03-26 MED ORDER — POTASSIUM CHLORIDE ER 10 MEQ PO TBCR: 10.0000 meq | EXTENDED_RELEASE_TABLET | Freq: Two times a day (BID) | ORAL | 3 refills | Status: DC

## 2016-03-26 NOTE — Assessment & Plan Note (Signed)
Controlled, no change in medication  

## 2016-03-26 NOTE — Patient Instructions (Addendum)
Annual wellness with Albina Billet 8/9 or after  F/U with MD mid [facility] early October call if you need me sooner  Pneumonia 23 vaccine today  Fasting lipid, cmp and EGFR, HBA1C, and TSH first week in September  Please cut back cheese, and commit to exercise 30 mins 6 days per week  Caltrate with D one daily, one multivitamin daily   You arer referred to orthopedics re trigger finger  New is potassium 10 meq take TWO daily, and  No more weekly vit D , daily vit D  Thank you  for choosing [facility]. We consider it a privelige to serve you.  Delivering excellent health care in a caring and  compassionate way is our goal.  Partnering with you,  so that together we can achieve this goal is our strategy.   Please work on good  health habits so that your health will improve. 1. Commitment to daily physical activity for 30 to 60  minutes, if you are able to do this.  2. Commitment to wise food choices. Aim for half of your  food intake to be vegetable and fruit, one quarter starchy foods, and one quarter protein. Try to eat on a regular schedule  3 meals per day, snacking between meals should be limited to vegetables or fruits or small portions of nuts. 64 ounces of water per day is generally recommended, unless you have specific health conditions, like heart failure or kidney failure where you will need to limit fluid intake.  3. Commitment to sufficient and a  good quality of physical and mental rest daily, generally between 6 to 8 hours per day.  WITH PERSISTANCE AND PERSEVERANCE, THE IMPOSSIBLE , BECOMES THE NORM!

## 2016-03-26 NOTE — Assessment & Plan Note (Signed)
Improved , no med change Lisa Mann is reminded of the importance of commitment to daily physical activity for 30 minutes or more, as able and the need to limit carbohydrate intake to 30 to 60 grams per meal to help with blood sugar control.   The need to take medication as prescribed, test blood sugar as directed, and to call between visits if there is a concern that blood sugar is uncontrolled is also discussed.   Lisa Mann is reminded of the importance of daily foot exam, annual eye examination, and good blood sugar, blood pressure and cholesterol control.  Diabetic Labs Latest Ref Rng & Units 03/20/2016 11/22/2015 08/10/2015 02/06/2015 11/23/2014  HbA1c <5.7 % 6.5(H) - 6.9(H) 6.6(H) -  Microalbumin <2.0 mg/dL - - - - -  Micro/Creat Ratio 0.0 - 30.0 mg/g - - - - -  Chol <200 mg/dL 159 - 166 165 -  HDL >50 mg/dL 49(L) - 53 48 -  Calc LDL <100 mg/dL 78 - 92 93 -  Triglycerides <150 mg/dL 161(H) - 104 122 -  Creatinine 0.60 - 0.88 mg/dL 0.58(L) 0.77 0.69 0.59(L) 0.58   BP/Weight 03/26/2016 12/14/2015 11/22/2015 08/14/2015 02/13/2015 11/23/2014 19/62/2297  Systolic BP 989 211 941 740 814 481 856  Diastolic BP 78 62 56 80 70 59 80  Wt. (Lbs) 187 187 183.4 186 185 186 185.12  BMI 33.13 33.13 32.49 32.95 32.78 32.96 32.8   Foot/eye exam completion dates Latest Ref Rng & Units 12/14/2015 11/08/2015  Eye Exam No Retinopathy - No Retinopathy  Foot exam Order - - -  Foot Form Completion - Done -

## 2016-03-26 NOTE — Assessment & Plan Note (Signed)
Uncontrolled need to reduce cheese and start exercise Hyperlipidemia:Low fat diet discussed and encouraged.   Lipid Panel  Lab Results  Component Value Date   CHOL 159 03/20/2016   HDL 49 (L) 03/20/2016   LDLCALC 78 03/20/2016   TRIG 161 (H) 03/20/2016   CHOLHDL 3.2 03/20/2016

## 2016-03-26 NOTE — Assessment & Plan Note (Signed)
Controlled, no change in medication DASH diet and commitment to daily physical activity for a minimum of 30 minutes discussed and encouraged, as a part of hypertension management. The importance of attaining a healthy weight is also discussed.  BP/Weight 03/26/2016 12/14/2015 11/22/2015 08/14/2015 02/13/2015 11/23/2014 67/67/2094  Systolic BP 709 628 366 294 765 465 035  Diastolic BP 78 62 56 80 70 59 80  Wt. (Lbs) 187 187 183.4 186 185 186 185.12  BMI 33.13 33.13 32.49 32.95 32.78 32.96 32.8

## 2016-03-26 NOTE — Assessment & Plan Note (Signed)
Unchanged Patient re-educated about  the importance of commitment to a  minimum of 150 minutes of exercise per week.  The importance of healthy food choices with portion control discussed. Encouraged to start a food diary, count calories and to consider  joining a support group. Sample diet sheets offered. Goals set by the patient for the next several months.   Weight /BMI 03/26/2016 12/14/2015 11/22/2015  WEIGHT 187 lb 187 lb 183 lb 6.4 oz  HEIGHT 5\' 3"  5\' 3"  -  BMI 33.13 kg/m2 33.13 kg/m2 32.49 kg/m2

## 2016-03-26 NOTE — Assessment & Plan Note (Signed)
After obtaining informed consent, the vaccine is  administered by LPN.  

## 2016-03-26 NOTE — Progress Notes (Signed)
Lisa Mann     MRN: [id number]      DOB: [birth year]   HPI Lisa Mann is here for follow up and re-evaluation of chronic medical conditions, medication management and review of any available recent lab and radiology data.  Preventive health is updated, specifically  Cancer screening and Immunization.   Questions or concerns regarding consultations or procedures which the PT has had in the interim are  addressed. The PT denies any adverse reactions to current medications since the last visit.  Denies polyuria, polydipsia, blurred vision , or hypoglycemic episodes. c/o painful triggering right ring finger x 3 months, wants help Still no regular exercise, plans to change this  ROS Denies recent fever or chills. Denies sinus pressure, nasal congestion, ear pain or sore throat. Denies chest congestion, productive cough or wheezing. Denies chest pains, palpitations and leg swelling Denies abdominal pain, nausea, vomiting,diarrhea or constipation.   Denies dysuria, frequency, hesitancy or incontinence.  Denies headaches, seizures, numbness, or tingling. Denies depression, anxiety or insomnia. Denies skin break down or rash.   PE  BP 120/78   Pulse 79   Resp 15   Ht 5\' 3"  (1.6 m)   Wt 187 lb (84.8 kg)   SpO2 96%   BMI 33.13 kg/m   Patient alert and oriented and in no cardiopulmonary distress.  HEENT: No facial asymmetry, EOMI,   oropharynx pink and moist.  Neck supple no JVD, no mass.  Chest: Clear to auscultation bilaterally.  CVS: S1, S2 no murmurs, no S3.Regular rate.  ABD: Soft non tender.   Ext: No edema  MS: Adequate though reduced ROM spine, shoulders, hips and knees. Right ring trigger finger Skin: Intact, no ulcerations or rash noted.  Psych: Good eye contact, normal affect. Memory intact not anxious or depressed appearing.  CNS: CN 2-12 intact, power,  normal throughout.no focal deficits noted.   Assessment & Plan  Trigger finger, right ring finger 3  month h/o trigger nd pain, refer to ortho  Need for 23-polyvalent pneumococcal polysaccharide vaccine After obtaining informed consent, the vaccine is  administered by LPN.   Diabetes mellitus with neuropathy Improved , no med change Lisa Mann is reminded of the importance of commitment to daily physical activity for 30 minutes or more, as able and the need to limit carbohydrate intake to 30 to 60 grams per meal to help with blood sugar control.   The need to take medication as prescribed, test blood sugar as directed, and to call between visits if there is a concern that blood sugar is uncontrolled is also discussed.   Lisa Mann is reminded of the importance of daily foot exam, annual eye examination, and good blood sugar, blood pressure and cholesterol control.  Diabetic Labs Latest Ref Rng & Units 03/20/2016 11/22/2015 08/10/2015 02/06/2015 11/23/2014  HbA1c <5.7 % 6.5(H) - 6.9(H) 6.6(H) -  Microalbumin <2.0 mg/dL - - - - -  Micro/Creat Ratio 0.0 - 30.0 mg/g - - - - -  Chol <200 mg/dL 159 - 166 165 -  HDL >50 mg/dL 49(L) - 53 48 -  Calc LDL <100 mg/dL 78 - 92 93 -  Triglycerides <150 mg/dL 161(H) - 104 122 -  Creatinine 0.60 - 0.88 mg/dL 0.58(L) 0.77 0.69 0.59(L) 0.58   BP/Weight 03/26/2016 12/14/2015 11/22/2015 08/14/2015 02/13/2015 11/23/2014 62/95/2841  Systolic BP 324 401 027 253 664 403 474  Diastolic BP 78 62 56 80 70 59 80  Wt. (Lbs) 187 187 183.4 186 185 186 185.12  BMI 33.13 33.13 32.49 32.95 32.78 32.96 32.8   Foot/eye exam completion dates Latest Ref Rng & Units 12/14/2015 11/08/2015  Eye Exam No Retinopathy - No Retinopathy  Foot exam Order - - -  Foot Form Completion - Done -     Hyperlipidemia with target LDL less than 100 Uncontrolled need to reduce cheese and start exercise Hyperlipidemia:Low fat diet discussed and encouraged.   Lipid Panel  Lab Results  Component Value Date   CHOL 159 03/20/2016   HDL 49 (L) 03/20/2016   LDLCALC 78 03/20/2016   TRIG 161 (H)  03/20/2016   CHOLHDL 3.2 03/20/2016       HTN, goal below 130/80 Controlled, no change in medication DASH diet and commitment to daily physical activity for a minimum of 30 minutes discussed and encouraged, as a part of hypertension management. The importance of attaining a healthy weight is also discussed.  BP/Weight 03/26/2016 12/14/2015 11/22/2015 08/14/2015 02/13/2015 11/23/2014 00/34/9179  Systolic BP 150 569 794 801 655 374 827  Diastolic BP 78 62 56 80 70 59 80  Wt. (Lbs) 187 187 183.4 186 185 186 185.12  BMI 33.13 33.13 32.49 32.95 32.78 32.96 32.8       Obesity (BMI 30.0-34.9) Unchanged Patient re-educated about  the importance of commitment to a  minimum of 150 minutes of exercise per week.  The importance of healthy food choices with portion control discussed. Encouraged to start a food diary, count calories and to consider  joining a support group. Sample diet sheets offered. Goals set by the patient for the next several months.   Weight /BMI 03/26/2016 12/14/2015 11/22/2015  WEIGHT 187 lb 187 lb 183 lb 6.4 oz  HEIGHT 5\' 3"  5\' 3"  -  BMI 33.13 kg/m2 33.13 kg/m2 32.49 kg/m2      GERD Controlled, no change in medication

## 2016-03-26 NOTE — Assessment & Plan Note (Signed)
3 month h/o trigger nd pain, refer to ortho

## 2016-04-02 ENCOUNTER — Other Ambulatory Visit: Payer: Self-pay | Admitting: Family Medicine

## 2016-04-09 ENCOUNTER — Encounter: Payer: Self-pay | Admitting: Orthopaedic Surgery

## 2016-04-09 ENCOUNTER — Ambulatory Visit (INDEPENDENT_AMBULATORY_CARE_PROVIDER_SITE_OTHER): Payer: Medicare Other | Admitting: Orthopaedic Surgery

## 2016-04-09 VITALS — BP 138/75 | HR 97 | Ht 63.0 in | Wt 188.0 lb

## 2016-04-09 DIAGNOSIS — M6530 Trigger finger, unspecified finger: Secondary | ICD-10-CM
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
## 2016-04-23 ENCOUNTER — Ambulatory Visit: Payer: Medicare Other | Admitting: Orthopaedic Surgery

## 2016-05-05 ENCOUNTER — Other Ambulatory Visit: Payer: Self-pay | Admitting: Family Medicine

## 2016-05-21 ENCOUNTER — Other Ambulatory Visit (HOSPITAL_COMMUNITY): Payer: Self-pay | Admitting: *Deleted

## 2016-05-21 DIAGNOSIS — Z853 Personal history of malignant neoplasm of breast: Secondary | ICD-10-CM

## 2016-05-22 ENCOUNTER — Encounter (HOSPITAL_COMMUNITY): Payer: Medicare Other | Attending: Oncology

## 2016-05-22 ENCOUNTER — Encounter (HOSPITAL_COMMUNITY): Payer: Self-pay | Admitting: Adult Health

## 2016-05-22 ENCOUNTER — Ambulatory Visit (HOSPITAL_COMMUNITY): Payer: Medicare Other

## 2016-05-22 ENCOUNTER — Other Ambulatory Visit (HOSPITAL_COMMUNITY): Payer: Medicare Other

## 2016-05-22 ENCOUNTER — Encounter (HOSPITAL_BASED_OUTPATIENT_CLINIC_OR_DEPARTMENT_OTHER): Payer: Medicare Other | Admitting: Adult Health

## 2016-05-22 VITALS — BP 139/71 | HR 82 | Temp 97.9°F | Resp 20 | Wt 188.0 lb

## 2016-05-22 DIAGNOSIS — C50911 Malignant neoplasm of unspecified site of right female breast: Secondary | ICD-10-CM

## 2016-05-22 DIAGNOSIS — Z171 Estrogen receptor negative status [ER-]: Principal | ICD-10-CM

## 2016-05-22 DIAGNOSIS — C50912 Malignant neoplasm of unspecified site of left female breast: Principal | ICD-10-CM

## 2016-05-22 DIAGNOSIS — Z853 Personal history of malignant neoplasm of breast: Secondary | ICD-10-CM | POA: Diagnosis not present

## 2016-05-22 DIAGNOSIS — G620 Drug-induced polyneuropathy: Secondary | ICD-10-CM | POA: Diagnosis not present

## 2016-05-22 LAB — CBC WITH DIFFERENTIAL/PLATELET
Basophils Absolute: 0 10*3/uL (ref 0.0–0.1)
Basophils Relative: 1 %
Eosinophils Absolute: 0.1 10*3/uL (ref 0.0–0.7)
Eosinophils Relative: 2 %
HCT: 38.5 % (ref 36.0–46.0)
Hemoglobin: 12.8 g/dL (ref 12.0–15.0)
Lymphocytes Relative: 37 %
Lymphs Abs: 1.9 10*3/uL (ref 0.7–4.0)
MCH: 29.8 pg (ref 26.0–34.0)
MCHC: 33.2 g/dL (ref 30.0–36.0)
MCV: 89.5 fL (ref 78.0–100.0)
Monocytes Absolute: 0.4 10*3/uL (ref 0.1–1.0)
Monocytes Relative: 7 %
Neutro Abs: 2.6 10*3/uL (ref 1.7–7.7)
Neutrophils Relative %: 53 %
Platelets: 279 10*3/uL (ref 150–400)
RBC: 4.3 MIL/uL (ref 3.87–5.11)
RDW: 15.2 % (ref 11.5–15.5)
WBC: 5 10*3/uL (ref 4.0–10.5)

## 2016-05-22 LAB — COMPREHENSIVE METABOLIC PANEL
ALT: 45 U/L (ref 14–54)
AST: 37 U/L (ref 15–41)
Albumin: 4.2 g/dL (ref 3.5–5.0)
Alkaline Phosphatase: 43 U/L (ref 38–126)
Anion gap: 9 (ref 5–15)
BUN: 20 mg/dL (ref 6–20)
CO2: 31 mmol/L (ref 22–32)
Calcium: 9.8 mg/dL (ref 8.9–10.3)
Chloride: 97 mmol/L — ABNORMAL LOW (ref 101–111)
Creatinine, Ser: 0.69 mg/dL (ref 0.44–1.00)
GFR calc Af Amer: >60 mL/min (ref >60)
GFR calc non Af Amer: >60 mL/min (ref >60)
Glucose, Bld: 126 mg/dL — ABNORMAL HIGH (ref 65–99)
Potassium: 4.4 mmol/L (ref 3.5–5.1)
Sodium: 137 mmol/L (ref 135–145)
Total Bilirubin: 0.6 mg/dL (ref 0.3–1.2)
Total Protein: 7.5 g/dL (ref 6.5–8.1)
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
## 2016-05-22 NOTE — Patient Instructions (Addendum)
[place] at [facility] Discharge Instructions  RECOMMENDATIONS MADE BY THE CONSULTANT AND ANY TEST RESULTS WILL BE SENT TO YOUR REFERRING PHYSICIAN.  You were seen today by Mike Craze, NP  Return in 1 year for labs and follow up  Call us for any new or concerning symptoms such as headaches, dizziness, abdominal pain, chest lumps/bumps, bone pain    Thank you for choosing [place] at [facility] to provide your oncology and hematology care.  To afford each patient quality time with our provider, please arrive at least 15 minutes before your scheduled appointment time.    If you have a lab appointment with the [facility] please come in thru the  Main Entrance and check in at the main information desk  You need to re-schedule your appointment should you arrive 10 or more minutes late.  We strive to give you quality time with our providers, and arriving late affects you and other patients whose appointments are after yours.  Also, if you no show three or more times for appointments you may be dismissed from the clinic at the providers discretion.     Again, thank you for choosing [facility].  Our hope is that these requests will decrease the amount of time that you wait before being seen by our physicians.       _____________________________________________________________  Should you have questions after your visit to [facility], please contact our office at [phone] between the hours of 8:30 a.m. and 4:30 p.m.  Voicemails left after 4:30 p.m. will not be returned until the following business day.  For prescription refill requests, have your pharmacy contact our office.       Resources For Cancer Patients and their Caregivers ? American Cancer Society: Can assist with transportation, wigs, general needs, runs Look Good Feel Better.        704-696-4012 ? Cancer Care: Provides financial  assistance, online support groups, medication/co-pay assistance.  1-800-813-HOPE (878)510-7896) ? [place] Assists [place] cancer patients and their families through emotional , educational and financial support.  (718) 361-9506 ? [place] DSS Where to apply for food stamps, Medicaid and utility assistance. (469) 880-8256 ? [employer]: Transportation to medical appointments. (760)267-2408 ? Social Security Administration: May apply for disability if have a Stage IV cancer. 737 266 3079 512-772-4919 ? [employer], Disability and Transit Services: Assists with nutrition, care and transit needs. [place] Support Programs: @10RELATIVEDAYS @ > Cancer Support Group  2nd Tuesday of the month 1pm-2pm, Journey Room  > Creative Journey  3rd Tuesday of the month 1130am-1pm, Journey Room  > Look Good Feel Better  1st Wednesday of the month 10am-12 noon, Journey Room (Call [place] to register [phone])

## 2016-06-05 ENCOUNTER — Other Ambulatory Visit (INDEPENDENT_AMBULATORY_CARE_PROVIDER_SITE_OTHER): Payer: Self-pay | Admitting: Otolaryngology

## 2016-06-05 DIAGNOSIS — E041 Nontoxic single thyroid nodule: Secondary | ICD-10-CM

## 2016-06-11 DIAGNOSIS — L851 Acquired keratosis [keratoderma] palmaris et plantaris: Secondary | ICD-10-CM | POA: Diagnosis not present

## 2016-06-11 DIAGNOSIS — E1142 Type 2 diabetes mellitus with diabetic polyneuropathy: Secondary | ICD-10-CM | POA: Diagnosis not present

## 2016-06-11 DIAGNOSIS — B351 Tinea unguium: Secondary | ICD-10-CM | POA: Diagnosis not present

## 2016-06-14 ENCOUNTER — Ambulatory Visit (HOSPITAL_COMMUNITY)
Admission: RE | Admit: 2016-06-14 | Discharge: 2016-06-14 | Disposition: A | Discharge status: Home / Self Care | Payer: Medicare Other | Source: Ambulatory Visit | Attending: Otolaryngology | Admitting: Otolaryngology

## 2016-06-14 DIAGNOSIS — E042 Nontoxic multinodular goiter: Secondary | ICD-10-CM | POA: Diagnosis not present

## 2016-06-14 DIAGNOSIS — E041 Nontoxic single thyroid nodule: Secondary | ICD-10-CM | POA: Diagnosis present

## 2016-07-11 ENCOUNTER — Other Ambulatory Visit: Payer: Self-pay | Admitting: *Deleted

## 2016-07-11 ENCOUNTER — Ambulatory Visit (INDEPENDENT_AMBULATORY_CARE_PROVIDER_SITE_OTHER): Payer: Medicare Other | Admitting: Otolaryngology

## 2016-07-11 ENCOUNTER — Other Ambulatory Visit: Payer: Self-pay | Admitting: Family Medicine

## 2016-07-11 ENCOUNTER — Telehealth: Payer: Self-pay | Admitting: *Deleted

## 2016-07-11 ENCOUNTER — Ambulatory Visit (INDEPENDENT_AMBULATORY_CARE_PROVIDER_SITE_OTHER): Payer: Medicare Other

## 2016-07-11 DIAGNOSIS — Z9010 Acquired absence of unspecified breast and nipple: Secondary | ICD-10-CM

## 2016-07-11 DIAGNOSIS — M5432 Sciatica, left side: Secondary | ICD-10-CM

## 2016-07-11 DIAGNOSIS — D440 Neoplasm of uncertain behavior of thyroid gland: Secondary | ICD-10-CM | POA: Diagnosis not present

## 2016-07-11 MED ORDER — PREDNISONE 5 MG PO TABS: 5.0000 mg | ORAL_TABLET | Freq: Two times a day (BID) | ORAL | 0 refills | Status: AC

## 2016-07-11 MED ORDER — METHYLPREDNISOLONE ACETATE 80 MG/ML IJ SUSP
80.0000 mg | Freq: Once | INTRAMUSCULAR | Status: AC
Start: 2016-07-11 — End: 2016-07-11
  Administered 2016-07-11: 80 mg via INTRAMUSCULAR

## 2016-07-11 MED ORDER — KETOROLAC TROMETHAMINE 60 MG/2ML IM SOLN
60.0000 mg | Freq: Once | INTRAMUSCULAR | Status: AC
  Administered 2016-07-11: 60 mg via INTRAMUSCULAR

## 2016-07-11 NOTE — Telephone Encounter (Signed)
Patient informed of message below, verbalized understanding. Will come by for injection

## 2016-07-11 NOTE — Telephone Encounter (Signed)
Patient called stating she has a appointment with Dr Benjamine Mola at 2:50, patient is requesting to get a shot patient said the pain is in her legs. Patient would like to get this shot today before or after she see's Dr Benjamine Mola. Please advise [phone]

## 2016-07-11 NOTE — Telephone Encounter (Signed)
I spoke with the pt . She has esatblished sciatica pls call and give her a convenient time for you to administer Toradol 60 mg iM and depo medrol 80 mg iM for acute flare of sciatic pain today , allowing her sufficient time to get to her ENT appt after the injection. ?? pls ask Thank you  Also pls let her know I will send in 5 days of prednisone to her local pharmacy

## 2016-07-18 ENCOUNTER — Telehealth: Payer: Self-pay | Admitting: Family Medicine

## 2016-07-18 NOTE — Telephone Encounter (Signed)
Patient left message on nurse line for Dr Moshe Cipro to call her.    Called patient to inform that Dr Moshe Cipro was out of office until Monday. She states that she is still having pain in leg and ankle, shoots down to ankle and back up to hip, is wanting to know if she needs another shot, or what to do.

## 2016-07-19 ENCOUNTER — Other Ambulatory Visit: Payer: Self-pay | Admitting: Family Medicine

## 2016-07-19 ENCOUNTER — Telehealth: Payer: Self-pay | Admitting: Family Medicine

## 2016-07-19 MED ORDER — PREDNISONE 5 MG (21) PO TBPK: 5.0000 mg | ORAL_TABLET | ORAL | 0 refills | Status: DC

## 2016-07-19 MED ORDER — RANITIDINE HCL 300 MG PO TABS: 300.0000 mg | ORAL_TABLET | Freq: Every day | ORAL | 0 refills | Status: DC

## 2016-07-19 NOTE — Telephone Encounter (Signed)
Patient informed of message below, verbalized understanding.  

## 2016-07-19 NOTE — Telephone Encounter (Signed)
Pls let her know I will prescribe medication by mouth, a 6 day course of prednisone and naproxen and zantac to protect her stomach, if she still has a lot of pain, she needs an appt scheduled with me in 2 weeks for follow up also, the week starting  July 23

## 2016-07-19 NOTE — Telephone Encounter (Signed)
Medications sent to the pharmacy are prednisone and zantac, NO naproxen, she is allergic, they are sent to [facility]

## 2016-08-07 ENCOUNTER — Other Ambulatory Visit: Payer: Self-pay | Admitting: Family Medicine

## 2016-08-07 ENCOUNTER — Ambulatory Visit (INDEPENDENT_AMBULATORY_CARE_PROVIDER_SITE_OTHER): Payer: Medicare Other

## 2016-08-07 VITALS — BP 124/68 | HR 76 | Temp 97.8°F | Ht 63.0 in | Wt 190.0 lb

## 2016-08-07 DIAGNOSIS — H9190 Unspecified hearing loss, unspecified ear: Secondary | ICD-10-CM | POA: Diagnosis not present

## 2016-08-07 DIAGNOSIS — Z0000 Encounter for general adult medical examination without abnormal findings: Secondary | ICD-10-CM | POA: Diagnosis not present

## 2016-08-07 NOTE — Addendum Note (Signed)
Addended by: Stormy Fabian D on: 08/07/2016 04:43 PM   Modules accepted: Orders
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
## 2016-08-07 NOTE — Patient Instructions (Addendum)
Lisa Mann , Thank you for taking time to come for your Medicare Wellness Visit. I appreciate your ongoing commitment to your health goals. Please review the following plan we discussed and let me know if I can assist you in the future.   Screening recommendations/referrals: Colonoscopy: No longer required due to age 83: No longer required due to double mastectomy Bone Density: Up to date Diabetic Eye Exam: Up to date, next due 11/2016 Recommended yearly dental visit for hygiene and checkup  Vaccinations: Influenza vaccine: Up to date, next due 09/2016 Pneumococcal vaccine: Up to date Tdap vaccine: Up to date, next due 04/2018 Shingles vaccine: Done    Advanced directives: Advance directive discussed with you today. I have provided a copy for you to complete at home and have notarized. Once this is complete please bring a copy in to our office so we can scan it into your chart.  Conditions/risks identified: Obese, recommend starting a routine exercise program at least 3 days a week for 30-45 minutes at a time as tolerated.   Next appointment: Follow up with Dr. Moshe Cipro on 10/07/2016 at 8:20 am. Follow up in 1 year for your annual wellness visit.  Preventive Care 2 Years and Older, Female Preventive care refers to lifestyle choices and visits with your health care provider that can promote health and wellness. What does preventive care include?  A yearly physical exam. This is also called an annual well check.  Dental exams once or twice a year.  Routine eye exams. Ask your health care provider how often you should have your eyes checked.  Personal lifestyle choices, including:  Daily care of your teeth and gums.  Regular physical activity.  Eating a healthy diet.  Avoiding tobacco and drug use.  Limiting alcohol use.  Practicing safe sex.  Taking low-dose aspirin every day.  Taking vitamin and mineral supplements as recommended by your health care provider. What  happens during an annual well check? The services and screenings done by your health care provider during your annual well check will depend on your age, overall health, lifestyle risk factors, and family history of disease. Counseling  Your health care provider may ask you questions about your:  Alcohol use.  Tobacco use.  Drug use.  Emotional well-being.  Home and relationship well-being.  Sexual activity.  Eating habits.  History of falls.  Memory and ability to understand (cognition).  Work and work Statistician.  Reproductive health. Screening  You may have the following tests or measurements:  Height, weight, and BMI.  Blood pressure.  Lipid and cholesterol levels. These may be checked every 5 years, or more frequently if you are over 16 years old.  Skin check.  Lung cancer screening. You may have this screening every year starting at age 46 if you have a 30-pack-year history of smoking and currently smoke or have quit within the past 15 years.  Fecal occult blood test (FOBT) of the stool. You may have this test every year starting at age 52.  Flexible sigmoidoscopy or colonoscopy. You may have a sigmoidoscopy every 5 years or a colonoscopy every 10 years starting at age 67.  Hepatitis C blood test.  Hepatitis B blood test.  Sexually transmitted disease (STD) testing.  Diabetes screening. This is done by checking your blood sugar (glucose) after you have not eaten for a while (fasting). You may have this done every 1-3 years.  Bone density scan. This is done to screen for osteoporosis. You may have this  done starting at age 72.  Mammogram. This may be done every 1-2 years. Talk to your health care provider about how often you should have regular mammograms. Talk with your health care provider about your test results, treatment options, and if necessary, the need for more tests. Vaccines  Your health care provider may recommend certain vaccines, such  as:  Influenza vaccine. This is recommended every year.  Tetanus, diphtheria, and acellular pertussis (Tdap, Td) vaccine. You may need a Td booster every 10 years.  Zoster vaccine. You may need this after age 35.  Pneumococcal 13-valent conjugate (PCV13) vaccine. One dose is recommended after age 54.  Pneumococcal polysaccharide (PPSV23) vaccine. One dose is recommended after age 64. Talk to your health care provider about which screenings and vaccines you need and how often you need them. This information is not intended to replace advice given to you by your health care provider. Make sure you discuss any questions you have with your health care provider. Document Released: 01/20/2015 Document Revised: 09/13/2015 Document Reviewed: 10/25/2014 Elsevier Interactive Patient Education  2017 [employer] Prevention in the Home Falls can cause injuries. They can happen to people of all ages. There are many things you can do to make your home safe and to help prevent falls. What can I do on the outside of my home?  Regularly fix the edges of walkways and driveways and fix any cracks.  Remove anything that might make you trip as you walk through a door, such as a raised step or threshold.  Trim any bushes or trees on the path to your home.  Use bright outdoor lighting.  Clear any walking paths of anything that might make someone trip, such as rocks or tools.  Regularly check to see if handrails are loose or broken. Make sure that both sides of any steps have handrails.  Any raised decks and porches should have guardrails on the edges.  Have any leaves, snow, or ice cleared regularly.  Use sand or salt on walking paths during winter.  Clean up any spills in your garage right away. This includes oil or grease spills. What can I do in the bathroom?  Use night lights.  Install grab bars by the toilet and in the tub and shower. Do not use towel bars as grab bars.  Use  non-skid mats or decals in the tub or shower.  If you need to sit down in the shower, use a plastic, non-slip stool.  Keep the floor dry. Clean up any water that spills on the floor as soon as it happens.  Remove soap buildup in the tub or shower regularly.  Attach bath mats securely with double-sided non-slip rug tape.  Do not have throw rugs and other things on the floor that can make you trip. What can I do in the bedroom?  Use night lights.  Make sure that you have a light by your bed that is easy to reach.  Do not use any sheets or blankets that are too big for your bed. They should not hang down onto the floor.  Have a firm chair that has side arms. You can use this for support while you get dressed.  Do not have throw rugs and other things on the floor that can make you trip. What can I do in the kitchen?  Clean up any spills right away.  Avoid walking on wet floors.  Keep items that you use a lot in easy-to-reach places.  If you need to reach something above you, use a strong step stool that has a grab bar.  Keep electrical cords out of the way.  Do not use floor polish or wax that makes floors slippery. If you must use wax, use non-skid floor wax.  Do not have throw rugs and other things on the floor that can make you trip. What can I do with my stairs?  Do not leave any items on the stairs.  Make sure that there are handrails on both sides of the stairs and use them. Fix handrails that are broken or loose. Make sure that handrails are as long as the stairways.  Check any carpeting to make sure that it is firmly attached to the stairs. Fix any carpet that is loose or worn.  Avoid having throw rugs at the top or bottom of the stairs. If you do have throw rugs, attach them to the floor with carpet tape.  Make sure that you have a light switch at the top of the stairs and the bottom of the stairs. If you do not have them, ask someone to add them for you. What  else can I do to help prevent falls?  Wear shoes that:  Do not have high heels.  Have rubber bottoms.  Are comfortable and fit you well.  Are closed at the toe. Do not wear sandals.  If you use a stepladder:  Make sure that it is fully opened. Do not climb a closed stepladder.  Make sure that both sides of the stepladder are locked into place.  Ask someone to hold it for you, if possible.  Clearly mark and make sure that you can see:  Any grab bars or handrails.  First and last steps.  Where the edge of each step is.  Use tools that help you move around (mobility aids) if they are needed. These include:  Canes.  Walkers.  Scooters.  Crutches.  Turn on the lights when you go into a dark area. Replace any light bulbs as soon as they burn out.  Set up your furniture so you have a clear path. Avoid moving your furniture around.  If any of your floors are uneven, fix them.  If there are any pets around you, be aware of where they are.  Review your medicines with your doctor. Some medicines can make you feel dizzy. This can increase your chance of falling. Ask your doctor what other things that you can do to help prevent falls. This information is not intended to replace advice given to you by your health care provider. Make sure you discuss any questions you have with your health care provider. Document Released: 10/20/2008 Document Revised: 06/01/2015 Document Reviewed: 01/28/2014 Elsevier Interactive Patient Education  2017 [employer].

## 2016-08-15 ENCOUNTER — Ambulatory Visit: Payer: Medicare Other

## 2016-08-19 ENCOUNTER — Other Ambulatory Visit: Payer: Self-pay | Admitting: Family Medicine

## 2016-08-20 DIAGNOSIS — L851 Acquired keratosis [keratoderma] palmaris et plantaris: Secondary | ICD-10-CM | POA: Diagnosis not present

## 2016-08-20 DIAGNOSIS — E1142 Type 2 diabetes mellitus with diabetic polyneuropathy: Secondary | ICD-10-CM | POA: Diagnosis not present

## 2016-08-20 DIAGNOSIS — B351 Tinea unguium: Secondary | ICD-10-CM | POA: Diagnosis not present

## 2016-09-02 ENCOUNTER — Other Ambulatory Visit: Payer: Self-pay | Admitting: Family Medicine

## 2016-09-12 ENCOUNTER — Ambulatory Visit (INDEPENDENT_AMBULATORY_CARE_PROVIDER_SITE_OTHER): Payer: Medicare Other | Admitting: Otolaryngology

## 2016-09-12 DIAGNOSIS — H903 Sensorineural hearing loss, bilateral: Secondary | ICD-10-CM | POA: Diagnosis not present

## 2016-09-30 ENCOUNTER — Other Ambulatory Visit: Payer: Self-pay | Admitting: Family Medicine

## 2016-10-01 ENCOUNTER — Telehealth: Payer: Self-pay | Admitting: Family Medicine

## 2016-10-01 DIAGNOSIS — E785 Hyperlipidemia, unspecified: Secondary | ICD-10-CM

## 2016-10-01 DIAGNOSIS — I10 Essential (primary) hypertension: Secondary | ICD-10-CM

## 2016-10-01 DIAGNOSIS — E1040 Type 1 diabetes mellitus with diabetic neuropathy, unspecified: Secondary | ICD-10-CM

## 2016-10-01 NOTE — Telephone Encounter (Signed)
Lab orders re-placed because they expired.

## 2016-10-02 LAB — COMPLETE METABOLIC PANEL WITH GFR
AG Ratio: 1.5 (calc) (ref 1.0–2.5)
ALT: 32 U/L — ABNORMAL HIGH (ref 6–29)
AST: 27 U/L (ref 10–35)
Albumin: 4.1 g/dL (ref 3.6–5.1)
Alkaline phosphatase (APISO): 46 U/L (ref 33–130)
BUN/Creatinine Ratio: 19 (calc) (ref 6–22)
BUN: 11 mg/dL (ref 7–25)
CO2: 31 mmol/L (ref 20–32)
Calcium: 9.7 mg/dL (ref 8.6–10.4)
Chloride: 99 mmol/L (ref 98–110)
Creat: 0.59 mg/dL — ABNORMAL LOW (ref 0.60–0.88)
GFR, Est African American: 100 mL/min/{1.73_m2} (ref >=60)
GFR, Est Non African American: 86 mL/min/{1.73_m2} (ref >=60)
Globulin: 2.8 g/dL (calc) (ref 1.9–3.7)
Glucose, Bld: 118 mg/dL — ABNORMAL HIGH (ref 65–99)
Potassium: 5 mmol/L (ref 3.5–5.3)
Sodium: 140 mmol/L (ref 135–146)
Total Bilirubin: 0.4 mg/dL (ref 0.2–1.2)
Total Protein: 6.9 g/dL (ref 6.1–8.1)

## 2016-10-02 LAB — HEMOGLOBIN A1C
Hgb A1c MFr Bld: 6.6 % of total Hgb — ABNORMAL HIGH (ref <5.7)
Mean Plasma Glucose: 143 (calc)
eAG (mmol/L): 7.9 (calc)

## 2016-10-02 LAB — LIPID PANEL
Cholesterol: 170 mg/dL (ref <200)
HDL: 60 mg/dL (ref >50)
LDL Cholesterol (Calc): 92 mg/dL (calc)
Non-HDL Cholesterol (Calc): 110 mg/dL (calc) (ref <130)
Total CHOL/HDL Ratio: 2.8 (calc) (ref <5.0)
Triglycerides: 88 mg/dL (ref <150)

## 2016-10-02 LAB — TSH: TSH: 0.28 mIU/L — ABNORMAL LOW (ref 0.40–4.50)

## 2016-10-07 ENCOUNTER — Ambulatory Visit: Payer: Medicare Other | Admitting: Family Medicine

## 2016-10-08 ENCOUNTER — Encounter: Payer: Self-pay | Admitting: Family Medicine

## 2016-10-08 ENCOUNTER — Ambulatory Visit (INDEPENDENT_AMBULATORY_CARE_PROVIDER_SITE_OTHER): Payer: Medicare Other | Admitting: Family Medicine

## 2016-10-08 VITALS — BP 120/70 | HR 70 | Temp 97.2°F | Resp 16 | Ht 63.0 in | Wt 190.8 lb

## 2016-10-08 DIAGNOSIS — I10 Essential (primary) hypertension: Secondary | ICD-10-CM

## 2016-10-08 DIAGNOSIS — E1040 Type 1 diabetes mellitus with diabetic neuropathy, unspecified: Secondary | ICD-10-CM | POA: Diagnosis not present

## 2016-10-08 DIAGNOSIS — Z23 Encounter for immunization: Secondary | ICD-10-CM | POA: Diagnosis not present

## 2016-10-08 DIAGNOSIS — E669 Obesity, unspecified: Secondary | ICD-10-CM

## 2016-10-08 DIAGNOSIS — E785 Hyperlipidemia, unspecified: Secondary | ICD-10-CM

## 2016-10-08 DIAGNOSIS — K219 Gastro-esophageal reflux disease without esophagitis: Secondary | ICD-10-CM

## 2016-10-08 DIAGNOSIS — M65341 Trigger finger, right ring finger: Secondary | ICD-10-CM | POA: Diagnosis not present

## 2016-10-08 DIAGNOSIS — E1169 Type 2 diabetes mellitus with other specified complication: Secondary | ICD-10-CM

## 2016-10-08 LAB — MICROALBUMIN / CREATININE URINE RATIO
Creatinine, Urine: 50 mg/dL (ref 20–275)
Microalb Creat Ratio: 6 mcg/mg creat (ref <30)
Microalb, Ur: 0.3 mg/dL

## 2016-10-08 MED ORDER — RAMIPRIL 10 MG PO CAPS: 10.0000 mg | ORAL_CAPSULE | Freq: Every day | ORAL | 3 refills | Status: DC

## 2016-10-08 NOTE — Patient Instructions (Addendum)
F/U with rectal end  January, call if you need me before  Please cut back on portions and walk every day/ exercise    Fasting lipid, cmp and EGFr and hBa1C, TSH free T3 and free T4  And CBC 1 week before follow up  Microalb from office today  Thank you  for choosing [facility]. We consider it a privelige to serve you.  Delivering excellent health care in a caring and  compassionate way is our goal.  Partnering with you,  so that together we can achieve this goal is our strategy.

## 2016-10-12 NOTE — Progress Notes (Signed)
Lisa Mann     MRN: [id number]      DOB: [birth year]   HPI Lisa Mann is here for follow up and re-evaluation of chronic medical conditions, medication management and review of any available recent lab and radiology data.  Preventive health is updated, specifically  Cancer screening and Immunization.   Questions or concerns regarding consultations or procedures which the PT has had in the interim are  addressed. The PT denies any adverse reactions to current medications since the last visit.  There are no new concerns.  There are no specific complaints  Denies polyuria, polydipsia, blurred vision , or hypoglycemic episodes.   ROS Denies recent fever or chills. Denies sinus pressure, nasal congestion, ear pain or sore throat. Denies chest congestion, productive cough or wheezing. Denies chest pains, palpitations and leg swelling Denies abdominal pain, nausea, vomiting,diarrhea or constipation.   Denies dysuria, frequency, hesitancy or incontinence. Denies joint pain, swelling and limitation in mobility. Denies headaches, seizures, numbness, or tingling. Denies depression, anxiety or insomnia. Denies skin break down or rash.   PE  BP 120/70 (BP Location: Left Arm, Patient Position: Sitting, Cuff Size: Normal)   Pulse 70   Temp (!) 97.2 F (36.2 C) (Other (Comment))   Resp 16   Ht 5\' 3"  (1.6 m)   Wt 190 lb 12 oz (86.5 kg)   SpO2 97%   BMI 33.79 kg/m   Patient alert and oriented and in no cardiopulmonary distress.  HEENT: No facial asymmetry, EOMI,   oropharynx pink and moist.  Neck supple no JVD, no mass.  Chest: Clear to auscultation bilaterally.  CVS: S1, S2 no murmurs, no S3.Regular rate.  ABD: Soft non tender.   Ext: No edema  MS: Adequate ROM spine, shoulders, hips and knees.  Skin: Intact, no ulcerations or rash noted.  Psych: Good eye contact, normal affect. Memory intact not anxious or depressed appearing.  CNS: CN 2-12 intact, power,  normal  throughout.no focal deficits noted.   Assessment & Plan HTN, goal below 130/80 Controlled, no change in medication DASH diet and commitment to daily physical activity for a minimum of 30 minutes discussed and encouraged, as a part of hypertension management. The importance of attaining a healthy weight is also discussed.  BP/Weight 10/08/2016 08/07/2016 05/22/2016 04/09/2016 03/26/2016 12/14/2015 27/25/3664  Systolic BP 403 474 259 563 875 643 329  Diastolic BP 70 68 71 75 78 62 56  Wt. (Lbs) 190.75 190 188 188 187 187 183.4  BMI 33.79 33.66 33.3 33.3 33.13 33.13 32.49       Diabetes mellitus with neuropathy Controlled, no change in medication Lisa Mann is reminded of the importance of commitment to daily physical activity for 30 minutes or more, as able and the need to limit carbohydrate intake to 30 to 60 grams per meal to help with blood sugar control.   The need to take medication as prescribed, test blood sugar as directed, and to call between visits if there is a concern that blood sugar is uncontrolled is also discussed.   Lisa Mann is reminded of the importance of daily foot exam, annual eye examination, and good blood sugar, blood pressure and cholesterol control.  Diabetic Labs Latest Ref Rng & Units 10/08/2016 10/01/2016 05/22/2016 03/20/2016 11/22/2015  HbA1c <5.7 % of total Hgb - 6.6(H) - 6.5(H) -  Microalbumin mg/dL 0.3 - - - -  Micro/Creat Ratio <30 mcg/mg creat 6 - - - -  Chol <200 mg/dL - 170 - 159 -  HDL >50 mg/dL - 60 - 49(L) -  Calc LDL <100 mg/dL - - - 78 -  Triglycerides <150 mg/dL - 88 - 161(H) -  Creatinine 0.60 - 0.88 mg/dL - 0.59(L) 0.69 0.58(L) 0.77   BP/Weight 10/08/2016 08/07/2016 05/22/2016 04/09/2016 03/26/2016 12/14/2015 23/95/3202  Systolic BP 334 356 861 683 729 021 115  Diastolic BP 70 68 71 75 78 62 56  Wt. (Lbs) 190.75 190 188 188 187 187 183.4  BMI 33.79 33.66 33.3 33.3 33.13 33.13 32.49   Foot/eye exam completion dates Latest Ref Rng & Units 12/14/2015  11/08/2015  Eye Exam No Retinopathy - No Retinopathy  Foot exam Order - - -  Foot Form Completion - Done -        Obesity (BMI 30.0-34.9) Unchnaged. Patient re-educated about  the importance of commitment to a  minimum of 150 minutes of exercise per week.  The importance of healthy food choices with portion control discussed. Encouraged to start a food diary, count calories and to consider  joining a support group. Sample diet sheets offered. Goals set by the patient for the next several months.   Weight /BMI 10/08/2016 08/07/2016 05/22/2016  WEIGHT 190 lb 12 oz 190 lb 188 lb  HEIGHT 5\' 3"  5\' 3"  -  BMI 33.79 kg/m2 33.66 kg/m2 33.3 kg/m2      Trigger finger, right ring finger unchnaged  GERD Controlled, no change in medication   Hyperlipidemia with target LDL less than 100 Hyperlipidemia:Low fat diet discussed and encouraged.   Lipid Panel  Lab Results  Component Value Date   CHOL 170 10/01/2016   HDL 60 10/01/2016   LDLCALC 78 03/20/2016   TRIG 88 10/01/2016   CHOLHDL 2.8 10/01/2016   Updated lab needed at/ before next visit.

## 2016-10-12 NOTE — Assessment & Plan Note (Signed)
Controlled, no change in medication  

## 2016-10-12 NOTE — Assessment & Plan Note (Signed)
Hyperlipidemia:Low fat diet discussed and encouraged.   Lipid Panel  Lab Results  Component Value Date   CHOL 170 10/01/2016   HDL 60 10/01/2016   LDLCALC 78 03/20/2016   TRIG 88 10/01/2016   CHOLHDL 2.8 10/01/2016   Updated lab needed at/ before next visit.

## 2016-10-12 NOTE — Assessment & Plan Note (Signed)
Controlled, no change in medication Lisa Mann is reminded of the importance of commitment to daily physical activity for 30 minutes or more, as able and the need to limit carbohydrate intake to 30 to 60 grams per meal to help with blood sugar control.   The need to take medication as prescribed, test blood sugar as directed, and to call between visits if there is a concern that blood sugar is uncontrolled is also discussed.   Lisa Mann is reminded of the importance of daily foot exam, annual eye examination, and good blood sugar, blood pressure and cholesterol control.  Diabetic Labs Latest Ref Rng & Units 10/08/2016 10/01/2016 05/22/2016 03/20/2016 11/22/2015  HbA1c <5.7 % of total Hgb - 6.6(H) - 6.5(H) -  Microalbumin mg/dL 0.3 - - - -  Micro/Creat Ratio <30 mcg/mg creat 6 - - - -  Chol <200 mg/dL - 170 - 159 -  HDL >50 mg/dL - 60 - 49(L) -  Calc LDL <100 mg/dL - - - 78 -  Triglycerides <150 mg/dL - 88 - 161(H) -  Creatinine 0.60 - 0.88 mg/dL - 0.59(L) 0.69 0.58(L) 0.77   BP/Weight 10/08/2016 08/07/2016 05/22/2016 04/09/2016 03/26/2016 12/14/2015 13/08/6576  Systolic BP 469 629 528 413 244 010 272  Diastolic BP 70 68 71 75 78 62 56  Wt. (Lbs) 190.75 190 188 188 187 187 183.4  BMI 33.79 33.66 33.3 33.3 33.13 33.13 32.49   Foot/eye exam completion dates Latest Ref Rng & Units 12/14/2015 11/08/2015  Eye Exam No Retinopathy - No Retinopathy  Foot exam Order - - -  Foot Form Completion - Done -

## 2016-10-12 NOTE — Assessment & Plan Note (Signed)
unchnaged °

## 2016-10-12 NOTE — Assessment & Plan Note (Signed)
Unchnaged. Patient re-educated about  the importance of commitment to a  minimum of 150 minutes of exercise per week.  The importance of healthy food choices with portion control discussed. Encouraged to start a food diary, count calories and to consider  joining a support group. Sample diet sheets offered. Goals set by the patient for the next several months.   Weight /BMI 10/08/2016 08/07/2016 05/22/2016  WEIGHT 190 lb 12 oz 190 lb 188 lb  HEIGHT 5\' 3"  5\' 3"  -  BMI 33.79 kg/m2 33.66 kg/m2 33.3 kg/m2

## 2016-10-12 NOTE — Assessment & Plan Note (Signed)
Controlled, no change in medication DASH diet and commitment to daily physical activity for a minimum of 30 minutes discussed and encouraged, as a part of hypertension management. The importance of attaining a healthy weight is also discussed.  BP/Weight 10/08/2016 08/07/2016 05/22/2016 04/09/2016 03/26/2016 12/14/2015 74/16/3845  Systolic BP 364 680 321 224 825 003 704  Diastolic BP 70 68 71 75 78 62 56  Wt. (Lbs) 190.75 190 188 188 187 187 183.4  BMI 33.79 33.66 33.3 33.3 33.13 33.13 32.49

## 2016-10-23 ENCOUNTER — Other Ambulatory Visit: Payer: Self-pay | Admitting: Family Medicine

## 2016-10-26 DIAGNOSIS — M5432 Sciatica, left side: Secondary | ICD-10-CM | POA: Diagnosis not present

## 2016-10-29 DIAGNOSIS — L851 Acquired keratosis [keratoderma] palmaris et plantaris: Secondary | ICD-10-CM | POA: Diagnosis not present

## 2016-10-29 DIAGNOSIS — B351 Tinea unguium: Secondary | ICD-10-CM | POA: Diagnosis not present

## 2016-10-29 DIAGNOSIS — E1142 Type 2 diabetes mellitus with diabetic polyneuropathy: Secondary | ICD-10-CM | POA: Diagnosis not present

## 2016-11-04 ENCOUNTER — Telehealth: Payer: Self-pay | Admitting: *Deleted

## 2016-11-04 NOTE — Telephone Encounter (Signed)
pLEASE WORK IN TOMORROW EITHER 9:45 OR 11:15AM

## 2016-11-04 NOTE — Telephone Encounter (Signed)
Patient called stating she started having back pain about 2 weeks and ago and she went to a urgent care due to our office being closed a week ago and they gave her an injection. Patient states she is still having back pain and she would like to see Dr Moshe Cipro. When can this patient be scheduled? [phone].

## 2016-11-05 ENCOUNTER — Encounter: Payer: Self-pay | Admitting: Family Medicine

## 2016-11-05 ENCOUNTER — Other Ambulatory Visit: Payer: Self-pay | Admitting: Family Medicine

## 2016-11-05 ENCOUNTER — Ambulatory Visit (INDEPENDENT_AMBULATORY_CARE_PROVIDER_SITE_OTHER): Payer: Medicare Other | Admitting: Family Medicine

## 2016-11-05 VITALS — BP 130/70 | HR 76 | Temp 98.3°F | Resp 16 | Ht 63.0 in | Wt 176.5 lb

## 2016-11-05 DIAGNOSIS — M5442 Lumbago with sciatica, left side: Secondary | ICD-10-CM | POA: Diagnosis not present

## 2016-11-05 DIAGNOSIS — R2681 Unsteadiness on feet: Secondary | ICD-10-CM

## 2016-11-05 DIAGNOSIS — I10 Essential (primary) hypertension: Secondary | ICD-10-CM | POA: Diagnosis not present

## 2016-11-05 MED ORDER — UNABLE TO FIND: 0 refills | Status: DC

## 2016-11-05 MED ORDER — PREDNISONE 10 MG PO TABS: ORAL_TABLET | ORAL | 0 refills | Status: DC

## 2016-11-05 MED ORDER — METHYLPREDNISOLONE ACETATE 80 MG/ML IJ SUSP
80.0000 mg | Freq: Once | INTRAMUSCULAR | Status: AC
  Administered 2016-11-05: 80 mg via INTRAMUSCULAR

## 2016-11-05 MED ORDER — KETOROLAC TROMETHAMINE 60 MG/2ML IM SOLN
60.0000 mg | Freq: Once | INTRAMUSCULAR | Status: AC
  Administered 2016-11-05: 60 mg via INTRAMUSCULAR

## 2016-11-05 NOTE — Patient Instructions (Addendum)
F/ U in  6 weeks , call if you need me sooner  Toradol 60 mg IM and depo medrol 80 mg iM administered in the officwe for acute back pain , and prednisone prescribed short term  You are referred for physical therapy for 6 weeks  Script for clawed cane iossent to   [place] for safety, due to high fall risk  Hope you feel better soon  Careful not to fall    Fall Prevention in the Home Falls can cause injuries. They can happen to people of all ages. There are many things you can do to make your home safe and to help prevent falls. What can I do on the outside of my home?  Regularly fix the edges of walkways and driveways and fix any cracks.  Remove anything that might make you trip as you walk through a door, such as a raised step or threshold.  Trim any bushes or trees on the path to your home.  Use bright outdoor lighting.  Clear any walking paths of anything that might make someone trip, such as rocks or tools.  Regularly check to see if handrails are loose or broken. Make sure that both sides of any steps have handrails.  Any raised decks and porches should have guardrails on the edges.  Have any leaves, snow, or ice cleared regularly.  Use sand or salt on walking paths during winter.  Clean up any spills in your garage right away. This includes oil or grease spills. What can I do in the bathroom?  Use night lights.  Install grab bars by the toilet and in the tub and shower. Do not use towel bars as grab bars.  Use non-skid mats or decals in the tub or shower.  If you need to sit down in the shower, use a plastic, non-slip stool.  Keep the floor dry. Clean up any water that spills on the floor as soon as it happens.  Remove soap buildup in the tub or shower regularly.  Attach bath mats securely with double-sided non-slip rug tape.  Do not have throw rugs and other things on the floor that can make you trip. What can I do in the bedroom?  Use night  lights.  Make sure that you have a light by your bed that is easy to reach.  Do not use any sheets or blankets that are too big for your bed. They should not hang down onto the floor.  Have a firm chair that has side arms. You can use this for support while you get dressed.  Do not have throw rugs and other things on the floor that can make you trip. What can I do in the kitchen?  Clean up any spills right away.  Avoid walking on wet floors.  Keep items that you use a lot in easy-to-reach places.  If you need to reach something above you, use a strong step stool that has a grab bar.  Keep electrical cords out of the way.  Do not use floor polish or wax that makes floors slippery. If you must use wax, use non-skid floor wax.  Do not have throw rugs and other things on the floor that can make you trip. What can I do with my stairs?  Do not leave any items on the stairs.  Make sure that there are handrails on both sides of the stairs and use them. Fix handrails that are broken or loose. Make sure that handrails  are as long as the stairways.  Check any carpeting to make sure that it is firmly attached to the stairs. Fix any carpet that is loose or worn.  Avoid having throw rugs at the top or bottom of the stairs. If you do have throw rugs, attach them to the floor with carpet tape.  Make sure that you have a light switch at the top of the stairs and the bottom of the stairs. If you do not have them, ask someone to add them for you. What else can I do to help prevent falls?  Wear shoes that: ? Do not have high heels. ? Have rubber bottoms. ? Are comfortable and fit you well. ? Are closed at the toe. Do not wear sandals.  If you use a stepladder: ? Make sure that it is fully opened. Do not climb a closed stepladder. ? Make sure that both sides of the stepladder are locked into place. ? Ask someone to hold it for you, if possible.  Clearly mark and make sure that you can  see: ? Any grab bars or handrails. ? First and last steps. ? Where the edge of each step is.  Use tools that help you move around (mobility aids) if they are needed. These include: ? Canes. ? Walkers. ? Scooters. ? Crutches.  Turn on the lights when you go into a dark area. Replace any light bulbs as soon as they burn out.  Set up your furniture so you have a clear path. Avoid moving your furniture around.  If any of your floors are uneven, fix them.  If there are any pets around you, be aware of where they are.  Review your medicines with your doctor. Some medicines can make you feel dizzy. This can increase your chance of falling. Ask your doctor what other things that you can do to help prevent falls. This information is not intended to replace advice given to you by your health care provider. Make sure you discuss any questions you have with your health care provider. Document Released: 10/20/2008 Document Revised: 06/01/2015 Document Reviewed: 01/28/2014 Elsevier Interactive Patient Education  [employer].

## 2016-11-05 NOTE — Progress Notes (Signed)
   TONYETTA BERKO     MRN: [id number]      DOB: [birth year]   HPI Ms. Bethune is her with a h/o back pain radiating to left leg x 2 weeks, started 2 weeks ago on  Thursday after picking 5 gallons of greens, radiates to left foot, was a 10 plus , went to UC, had IM injection and 6 day course of prednisone , now a 6 , still uncomfortable, and feels unsafe as though she will fall, has started using her cane again, she did have one near fall since this acute pain onset,requests physical therapy  Which she has benefited from in the past Denies new incontinence of stool or urine or lower extremity weakness or numbness    ROS Denies recent fever or chills. Denies sinus pressure, nasal congestion, ear pain or sore throat. Denies chest congestion, productive cough or wheezing. Denies chest pains, palpitations and leg swelling Denies abdominal pain, nausea, vomiting,diarrhea or constipation.   Denies dysuria, frequency, hesitancy or incontinence.  Denies headaches, seizures, numbness, or tingling. Denies depression, anxiety or insomnia. Denies skin break down or rash.   PE  Pulse 76   Temp 98.3 F (36.8 C) (Other (Comment))   Resp 16   Ht 5\' 3"  (1.6 m)   Wt 176 lb 8 oz (80.1 kg)   SpO2 99%   BMI 31.27 kg/m   Patient alert and oriented and in no cardiopulmonary distress. Pt in pain HEENT: No facial asymmetry, EOMI,   oropharynx pink and moist.  Neck supple no JVD, no mass.  Chest: Clear to auscultation bilaterally.  CVS: S1, S2 no murmurs, no S3.Regular rate.  ABD: Soft non tender.   Ext: No edema  MS: Decreased  ROM spine, shoulders, hips and knees.  Skin: Intact, no ulcerations or rash noted.  Psych: Good eye contact, normal affect. Memory intact not anxious or depressed appearing.  CNS: CN 2-12 intact, power,  normal throughout.no focal deficits noted.   Assessment & Plan  Acute back pain with sciatica Uncontrolled.Toradol and depo medrol administered IM in the office ,  to be followed by a short course of oral prednisone .   HTN, goal below 130/80 Controlled, no change in medication   Unsteady gait High fall risk due to unsteady gait, needs clawed cane to reduce falls and physical therapy will refer Home safety discussed and written info provided also

## 2016-11-05 NOTE — Telephone Encounter (Signed)
Patient is scheduled for today at 9:00. Patient is aware.

## 2016-11-08 DIAGNOSIS — M545 Low back pain: Secondary | ICD-10-CM | POA: Diagnosis not present

## 2016-11-08 DIAGNOSIS — M6281 Muscle weakness (generalized): Secondary | ICD-10-CM | POA: Diagnosis not present

## 2016-11-08 DIAGNOSIS — R2689 Other abnormalities of gait and mobility: Secondary | ICD-10-CM | POA: Diagnosis not present

## 2016-11-08 DIAGNOSIS — X501XXD Overexertion from prolonged static or awkward postures, subsequent encounter: Secondary | ICD-10-CM | POA: Diagnosis not present

## 2016-11-10 DIAGNOSIS — M5440 Lumbago with sciatica, unspecified side: Secondary | ICD-10-CM | POA: Insufficient documentation

## 2016-11-10 DIAGNOSIS — R2681 Unsteadiness on feet: Secondary | ICD-10-CM | POA: Insufficient documentation

## 2016-11-10 NOTE — Assessment & Plan Note (Addendum)
High fall risk due to unsteady gait, needs clawed cane to reduce falls and physical therapy will refer Home safety discussed and written info provided also

## 2016-11-10 NOTE — Assessment & Plan Note (Signed)
Controlled, no change in medication  

## 2016-11-10 NOTE — Assessment & Plan Note (Addendum)
Uncontrolled.Toradol and depo medrol administered IM in the office , to be followed by a short course of oral prednisone   

## 2016-11-11 ENCOUNTER — Other Ambulatory Visit: Payer: Self-pay | Admitting: Family Medicine

## 2016-11-12 DIAGNOSIS — E119 Type 2 diabetes mellitus without complications: Secondary | ICD-10-CM | POA: Diagnosis not present

## 2016-11-12 DIAGNOSIS — H04123 Dry eye syndrome of bilateral lacrimal glands: Secondary | ICD-10-CM | POA: Diagnosis not present

## 2016-11-12 DIAGNOSIS — H1851 Endothelial corneal dystrophy: Secondary | ICD-10-CM | POA: Diagnosis not present

## 2016-11-12 DIAGNOSIS — H2513 Age-related nuclear cataract, bilateral: Secondary | ICD-10-CM | POA: Diagnosis not present

## 2016-11-12 LAB — HM DIABETES EYE EXAM

## 2016-11-13 DIAGNOSIS — M6281 Muscle weakness (generalized): Secondary | ICD-10-CM | POA: Diagnosis not present

## 2016-11-13 DIAGNOSIS — R2689 Other abnormalities of gait and mobility: Secondary | ICD-10-CM | POA: Diagnosis not present

## 2016-11-13 DIAGNOSIS — M545 Low back pain: Secondary | ICD-10-CM | POA: Diagnosis not present

## 2016-11-13 DIAGNOSIS — X501XXD Overexertion from prolonged static or awkward postures, subsequent encounter: Secondary | ICD-10-CM | POA: Diagnosis not present

## 2016-11-15 DIAGNOSIS — M6281 Muscle weakness (generalized): Secondary | ICD-10-CM | POA: Diagnosis not present

## 2016-11-15 DIAGNOSIS — M545 Low back pain: Secondary | ICD-10-CM | POA: Diagnosis not present

## 2016-11-15 DIAGNOSIS — X501XXD Overexertion from prolonged static or awkward postures, subsequent encounter: Secondary | ICD-10-CM | POA: Diagnosis not present

## 2016-11-15 DIAGNOSIS — R2689 Other abnormalities of gait and mobility: Secondary | ICD-10-CM | POA: Diagnosis not present

## 2016-11-18 DIAGNOSIS — X501XXD Overexertion from prolonged static or awkward postures, subsequent encounter: Secondary | ICD-10-CM | POA: Diagnosis not present

## 2016-11-18 DIAGNOSIS — R2689 Other abnormalities of gait and mobility: Secondary | ICD-10-CM | POA: Diagnosis not present

## 2016-11-18 DIAGNOSIS — M545 Low back pain: Secondary | ICD-10-CM | POA: Diagnosis not present

## 2016-11-18 DIAGNOSIS — M6281 Muscle weakness (generalized): Secondary | ICD-10-CM | POA: Diagnosis not present

## 2016-11-19 ENCOUNTER — Telehealth: Payer: Self-pay | Admitting: *Deleted

## 2016-11-19 NOTE — Telephone Encounter (Signed)
Pharmacy called requesting to get patient's  lidocane filled. [phone]

## 2016-11-19 NOTE — Telephone Encounter (Signed)
No patient must request this herself

## 2016-11-21 ENCOUNTER — Telehealth: Payer: Self-pay | Admitting: Family Medicine

## 2016-11-21 DIAGNOSIS — X501XXD Overexertion from prolonged static or awkward postures, subsequent encounter: Secondary | ICD-10-CM | POA: Diagnosis not present

## 2016-11-21 DIAGNOSIS — R2689 Other abnormalities of gait and mobility: Secondary | ICD-10-CM | POA: Diagnosis not present

## 2016-11-21 DIAGNOSIS — M6281 Muscle weakness (generalized): Secondary | ICD-10-CM | POA: Diagnosis not present

## 2016-11-21 DIAGNOSIS — M545 Low back pain: Secondary | ICD-10-CM | POA: Diagnosis not present

## 2016-11-21 NOTE — Telephone Encounter (Signed)
[facility] let message on nurse line regarding patient. They state they have been faxing requests for lidocaine ointment for patient for quite a while and have heard nothing back. He states he has restarted fax cycle for [place] to start receiving requests.   Callback# [phone] Fax# [phone]

## 2016-11-21 NOTE — Telephone Encounter (Signed)
Pt must request

## 2016-11-25 DIAGNOSIS — R2689 Other abnormalities of gait and mobility: Secondary | ICD-10-CM | POA: Diagnosis not present

## 2016-11-25 DIAGNOSIS — M6281 Muscle weakness (generalized): Secondary | ICD-10-CM | POA: Diagnosis not present

## 2016-11-25 DIAGNOSIS — X501XXD Overexertion from prolonged static or awkward postures, subsequent encounter: Secondary | ICD-10-CM | POA: Diagnosis not present

## 2016-11-25 DIAGNOSIS — M545 Low back pain: Secondary | ICD-10-CM | POA: Diagnosis not present

## 2016-12-02 ENCOUNTER — Other Ambulatory Visit: Payer: Self-pay | Admitting: Family Medicine

## 2016-12-02 DIAGNOSIS — M6281 Muscle weakness (generalized): Secondary | ICD-10-CM | POA: Diagnosis not present

## 2016-12-02 DIAGNOSIS — R2689 Other abnormalities of gait and mobility: Secondary | ICD-10-CM | POA: Diagnosis not present

## 2016-12-02 DIAGNOSIS — M545 Low back pain: Secondary | ICD-10-CM | POA: Diagnosis not present

## 2016-12-02 DIAGNOSIS — X501XXD Overexertion from prolonged static or awkward postures, subsequent encounter: Secondary | ICD-10-CM | POA: Diagnosis not present

## 2016-12-05 ENCOUNTER — Emergency Department (HOSPITAL_COMMUNITY): Payer: No Typology Code available for payment source

## 2016-12-05 ENCOUNTER — Other Ambulatory Visit: Payer: Self-pay

## 2016-12-05 ENCOUNTER — Emergency Department (HOSPITAL_COMMUNITY)
Admission: EM | Admit: 2016-12-05 | Discharge: 2016-12-05 | Disposition: A | Discharge status: Home / Self Care | Payer: No Typology Code available for payment source | Attending: Emergency Medicine | Admitting: Emergency Medicine

## 2016-12-05 ENCOUNTER — Encounter (HOSPITAL_COMMUNITY): Payer: Self-pay | Admitting: Emergency Medicine

## 2016-12-05 DIAGNOSIS — Y92410 Unspecified street and highway as the place of occurrence of the external cause: Secondary | ICD-10-CM | POA: Diagnosis not present

## 2016-12-05 DIAGNOSIS — Z7984 Long term (current) use of oral hypoglycemic drugs: Secondary | ICD-10-CM | POA: Diagnosis not present

## 2016-12-05 DIAGNOSIS — Z7982 Long term (current) use of aspirin: Secondary | ICD-10-CM | POA: Diagnosis not present

## 2016-12-05 DIAGNOSIS — Y9389 Activity, other specified: Secondary | ICD-10-CM | POA: Insufficient documentation

## 2016-12-05 DIAGNOSIS — Z853 Personal history of malignant neoplasm of breast: Secondary | ICD-10-CM | POA: Diagnosis not present

## 2016-12-05 DIAGNOSIS — E1140 Type 2 diabetes mellitus with diabetic neuropathy, unspecified: Secondary | ICD-10-CM | POA: Insufficient documentation

## 2016-12-05 DIAGNOSIS — M545 Low back pain: Secondary | ICD-10-CM | POA: Diagnosis not present

## 2016-12-05 DIAGNOSIS — I10 Essential (primary) hypertension: Secondary | ICD-10-CM | POA: Diagnosis not present

## 2016-12-05 DIAGNOSIS — Z79899 Other long term (current) drug therapy: Secondary | ICD-10-CM | POA: Insufficient documentation

## 2016-12-05 DIAGNOSIS — Y999 Unspecified external cause status: Secondary | ICD-10-CM | POA: Insufficient documentation

## 2016-12-05 HISTORY — DX: Low back pain: M54.5

## 2016-12-05 HISTORY — DX: Low back pain, unspecified: M54.50

## 2016-12-05 HISTORY — DX: Radiculopathy, lumbar region: M54.16

## 2016-12-05 MED ORDER — ACETAMINOPHEN 325 MG PO TABS
650.0000 mg | ORAL_TABLET | Freq: Once | ORAL | Status: AC
  Administered 2016-12-05: 650 mg via ORAL
  Filled 2016-12-05: qty 2
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
## 2016-12-05 NOTE — Discharge Instructions (Signed)
Take your usual prescriptions as previously directed.  Apply moist heat or ice to the area(s) of discomfort, for 15 minutes at a time, several times per day for the next few days.  Do not fall asleep on a heating or ice pack.  Call your regular medical doctor tomorrow to schedule a follow up appointment within the next 3 days.  Return to the Emergency Department immediately if worsening.
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
## 2016-12-09 ENCOUNTER — Telehealth: Payer: Self-pay | Admitting: *Deleted

## 2016-12-09 DIAGNOSIS — X501XXD Overexertion from prolonged static or awkward postures, subsequent encounter: Secondary | ICD-10-CM | POA: Diagnosis not present

## 2016-12-09 DIAGNOSIS — R2689 Other abnormalities of gait and mobility: Secondary | ICD-10-CM | POA: Diagnosis not present

## 2016-12-09 DIAGNOSIS — M545 Low back pain: Secondary | ICD-10-CM | POA: Diagnosis not present

## 2016-12-09 DIAGNOSIS — M6281 Muscle weakness (generalized): Secondary | ICD-10-CM | POA: Diagnosis not present

## 2016-12-09 NOTE — Telephone Encounter (Signed)
Lisa Mann returned my call, patient is scheduled for tomorrow 12/10/16 at 1:00. Lisa Mann is aware.
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
## 2016-12-09 NOTE — Telephone Encounter (Signed)
Schedule as ER recommends

## 2016-12-10 ENCOUNTER — Encounter: Payer: Self-pay | Admitting: Family Medicine

## 2016-12-10 ENCOUNTER — Ambulatory Visit (INDEPENDENT_AMBULATORY_CARE_PROVIDER_SITE_OTHER): Payer: Medicare Other | Admitting: Family Medicine

## 2016-12-10 DIAGNOSIS — R52 Pain, unspecified: Secondary | ICD-10-CM | POA: Insufficient documentation

## 2016-12-10 MED ORDER — KETOROLAC TROMETHAMINE 60 MG/2ML IM SOLN
60.0000 mg | Freq: Once | INTRAMUSCULAR | Status: AC
  Administered 2016-12-10: 60 mg via INTRAMUSCULAR

## 2016-12-10 MED ORDER — CYCLOBENZAPRINE HCL 5 MG PO TABS: ORAL_TABLET | ORAL | 0 refills | Status: DC
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
## 2016-12-13 DIAGNOSIS — M545 Low back pain: Secondary | ICD-10-CM | POA: Diagnosis not present

## 2016-12-13 DIAGNOSIS — R2689 Other abnormalities of gait and mobility: Secondary | ICD-10-CM | POA: Diagnosis not present

## 2016-12-13 DIAGNOSIS — M6281 Muscle weakness (generalized): Secondary | ICD-10-CM | POA: Diagnosis not present

## 2016-12-13 DIAGNOSIS — X501XXD Overexertion from prolonged static or awkward postures, subsequent encounter: Secondary | ICD-10-CM | POA: Diagnosis not present

## 2016-12-14 ENCOUNTER — Encounter: Payer: Self-pay | Admitting: Family Medicine
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
## 2016-12-19 ENCOUNTER — Ambulatory Visit: Payer: Medicare Other | Admitting: Family Medicine

## 2016-12-19 DIAGNOSIS — M545 Low back pain: Secondary | ICD-10-CM | POA: Diagnosis not present

## 2016-12-19 DIAGNOSIS — X501XXD Overexertion from prolonged static or awkward postures, subsequent encounter: Secondary | ICD-10-CM | POA: Diagnosis not present

## 2016-12-19 DIAGNOSIS — R2689 Other abnormalities of gait and mobility: Secondary | ICD-10-CM | POA: Diagnosis not present

## 2016-12-19 DIAGNOSIS — M6281 Muscle weakness (generalized): Secondary | ICD-10-CM | POA: Diagnosis not present

## 2017-01-01 ENCOUNTER — Other Ambulatory Visit: Payer: Self-pay | Admitting: Family Medicine

## 2017-01-01 NOTE — Telephone Encounter (Signed)
Seen 12 4 18 

## 2017-01-13 ENCOUNTER — Ambulatory Visit (INDEPENDENT_AMBULATORY_CARE_PROVIDER_SITE_OTHER): Payer: Medicare Other | Admitting: Family Medicine

## 2017-01-13 ENCOUNTER — Encounter: Payer: Self-pay | Admitting: Family Medicine

## 2017-01-13 VITALS — BP 120/74 | HR 85 | Resp 16 | Ht 63.0 in | Wt 187.0 lb

## 2017-01-13 DIAGNOSIS — I10 Essential (primary) hypertension: Secondary | ICD-10-CM | POA: Diagnosis not present

## 2017-01-13 DIAGNOSIS — R2681 Unsteadiness on feet: Secondary | ICD-10-CM

## 2017-01-13 DIAGNOSIS — E559 Vitamin D deficiency, unspecified: Secondary | ICD-10-CM

## 2017-01-13 DIAGNOSIS — Z1211 Encounter for screening for malignant neoplasm of colon: Secondary | ICD-10-CM

## 2017-01-13 DIAGNOSIS — E1169 Type 2 diabetes mellitus with other specified complication: Secondary | ICD-10-CM

## 2017-01-13 DIAGNOSIS — E785 Hyperlipidemia, unspecified: Secondary | ICD-10-CM | POA: Diagnosis not present

## 2017-01-13 DIAGNOSIS — E669 Obesity, unspecified: Secondary | ICD-10-CM

## 2017-01-13 DIAGNOSIS — K219 Gastro-esophageal reflux disease without esophagitis: Secondary | ICD-10-CM | POA: Diagnosis not present

## 2017-01-13 DIAGNOSIS — E790 Hyperuricemia without signs of inflammatory arthritis and tophaceous disease: Secondary | ICD-10-CM

## 2017-01-13 DIAGNOSIS — Z23 Encounter for immunization: Secondary | ICD-10-CM | POA: Diagnosis not present

## 2017-01-13 DIAGNOSIS — E1140 Type 2 diabetes mellitus with diabetic neuropathy, unspecified: Secondary | ICD-10-CM

## 2017-01-13 MED ORDER — RAMIPRIL 10 MG PO CAPS: 10.0000 mg | ORAL_CAPSULE | Freq: Every day | ORAL | 3 refills | Status: DC

## 2017-01-13 MED ORDER — OMEPRAZOLE 40 MG PO CPDR: DELAYED_RELEASE_CAPSULE | ORAL | 1 refills | Status: DC

## 2017-01-13 MED ORDER — POTASSIUM CHLORIDE ER 10 MEQ PO TBCR: 10.0000 meq | EXTENDED_RELEASE_TABLET | Freq: Two times a day (BID) | ORAL | 3 refills | Status: DC

## 2017-01-13 MED ORDER — METFORMIN HCL 1000 MG PO TABS: 1000.0000 mg | ORAL_TABLET | Freq: Two times a day (BID) | ORAL | 3 refills | Status: DC

## 2017-01-13 MED ORDER — TRIAMTERENE-HCTZ 75-50 MG PO TABS: 1.0000 | ORAL_TABLET | Freq: Every day | ORAL | 1 refills | Status: DC

## 2017-01-13 MED ORDER — ALLOPURINOL 300 MG PO TABS: 300.0000 mg | ORAL_TABLET | Freq: Every day | ORAL | 1 refills | Status: DC

## 2017-01-13 MED ORDER — OMEPRAZOLE 20 MG PO CPDR: 20.0000 mg | DELAYED_RELEASE_CAPSULE | Freq: Every day | ORAL | 1 refills | Status: DC

## 2017-01-13 MED ORDER — UNABLE TO FIND: 0 refills | Status: DC

## 2017-01-13 MED ORDER — ATORVASTATIN CALCIUM 10 MG PO TABS: ORAL_TABLET | ORAL | 1 refills | Status: DC

## 2017-01-13 NOTE — Progress Notes (Signed)
Lisa Mann     MRN: [id number]      DOB: 06-Aug-1933   HPI Lisa Mann is here for follow up and re-evaluation of chronic medical conditions, medication management and review of any available recent lab and radiology data.  Preventive health is updated, specifically  Cancer screening and Immunization.   Questions or concerns regarding consultations or procedures which the PT has had in the interim are  addressed. The PT denies any adverse reactions to current medications since the last visit.  Denies polyuria, polydipsia, blurred vision , or hypoglycemic episodes. Tests blood sugar every morning , generally 125 Requests diabetic shoes because of flat feet, poor sensation and poor circulation and callus Requests shower chair due to poor balance , increased fall risk from arthritis in spine and disc disease which increases her fall risk, hence needs for safety ROS Denies recent fever or chills. Denies sinus pressure, nasal congestion, ear pain or sore throat. Denies chest congestion, productive cough or wheezing. Denies chest pains, palpitations and leg swelling Denies abdominal pain, nausea, vomiting,diarrhea or constipation.   Denies dysuria, frequency, hesitancy or incontinence.  Denies headaches, seizures, numbness, or tingling. Denies depression, anxiety or insomnia. Denies skin break down or rash.   PE  BP 120/74   Pulse 85   Resp 16   Ht 5\' 3"  (1.6 m)   Wt 187 lb (84.8 kg)   SpO2 96%   BMI 33.13 kg/m   Patient alert and oriented and in no cardiopulmonary distress.  HEENT: No facial asymmetry, EOMI,   oropharynx pink and moist.  Neck supple no JVD, no mass.  Chest: Clear to auscultation bilaterally.  CVS: S1, S2 no murmurs, no S3.Regular rate.  ABD: Soft non tender.  Rectal: no mass, heme negative stool Ext: No edema  MS: Decreased  ROM spine,adequate in  shoulders, decreased in hips and knees.  Skin: Intact, no ulcerations or rash noted.  Psych: Good eye  contact, normal affect. Memory intact not anxious or depressed appearing.  CNS: CN 2-12 intact, power,  normal throughout.no focal deficits noted.   Assessment & Plan  HTN, goal below 130/80 Controlled, no change in medication DASH diet and commitment to daily physical activity for a minimum of 30 minutes discussed and encouraged, as a part of hypertension management. The importance of attaining a healthy weight is also discussed.  BP/Weight 01/13/2017 12/10/2016 12/05/2016 11/05/2016 10/08/2016 08/07/2016 1/74/9449  Systolic BP 675 916 384 665 993 570 177  Diastolic BP 74 74 65 70 70 68 71  Wt. (Lbs) 187 186 192 176.5 190.75 190 188  BMI 33.13 32.95 34.01 31.27 33.79 33.66 33.3       Diabetes mellitus with neuropathy Controlled, no change in medication Lisa Mann is reminded of the importance of commitment to daily physical activity for 30 minutes or more, as able and the need to limit carbohydrate intake to 30 to 60 grams per meal to help with blood sugar control.   The need to take medication as prescribed, test blood sugar as directed, and to call between visits if there is a concern that blood sugar is uncontrolled is also discussed.   Lisa Mann is reminded of the importance of daily foot exam, annual eye examination, and good blood sugar, blood pressure and cholesterol control.  Diabetic Labs Latest Ref Rng & Units 10/08/2016 10/01/2016 05/22/2016 03/20/2016 11/22/2015  HbA1c <5.7 % of total Hgb - 6.6(H) - 6.5(H) -  Microalbumin mg/dL 0.3 - - - -  Micro/Creat  Ratio <30 mcg/mg creat 6 - - - -  Chol <200 mg/dL - 170 - 159 -  HDL >50 mg/dL - 60 - 49(L) -  Calc LDL <100 mg/dL - - - 78 -  Triglycerides <150 mg/dL - 88 - 161(H) -  Creatinine 0.60 - 0.88 mg/dL - 0.59(L) 0.69 0.58(L) 0.77   BP/Weight 01/13/2017 12/10/2016 12/05/2016 11/05/2016 10/08/2016 08/07/2016 5/63/8756  Systolic BP 433 295 188 416 606 301 601  Diastolic BP 74 74 65 70 70 68 71  Wt. (Lbs) 187 186 192 176.5 190.75 190  188  BMI 33.13 32.95 34.01 31.27 33.79 33.66 33.3   Foot/eye exam completion dates Latest Ref Rng & Units 01/13/2017 11/12/2016  Eye Exam No Retinopathy - No Retinopathy  Foot exam Order - - -  Foot Form Completion - Done -        DEGENERATIVE JOINT DISEASE, SPINE Increased fall risk due to established  Multilevel disease with nerve root compression, uses cane for safe ambulation Requires shower chair for in home safety with bath  GERD Denies caffeine and gERD symptoms, reduce omeprazole dose plan to d/c in 6 months  Unsteady gait Requires shower chair  for safety, increased fall risk due to severe arthritis and disc disease of spine  Hyperlipidemia with target LDL less than 100 Hyperlipidemia:Low fat diet discussed and encouraged.   Lipid Panel  Lab Results  Component Value Date   CHOL 170 10/01/2016   HDL 60 10/01/2016   LDLCALC 78 03/20/2016   TRIG 88 10/01/2016   CHOLHDL 2.8 10/01/2016   Updated lab needed at/ before next visit.     Obesity (BMI 30.0-34.9) Deteriorated. Patient re-educated about  the importance of commitment to a  minimum of 150 minutes of exercise per week.  The importance of healthy food choices with portion control discussed. Encouraged to start a food diary, count calories and to consider  joining a support group. Sample diet sheets offered. Goals set by the patient for the next several months.   Weight /BMI 01/13/2017 12/10/2016 12/05/2016  WEIGHT 187 lb 186 lb 192 lb  HEIGHT 5\' 3"  5\' 3"  -  BMI 33.13 kg/m2 32.95 kg/m2 34.01 kg/m2

## 2017-01-13 NOTE — Assessment & Plan Note (Signed)
Denies caffeine and gERD symptoms, reduce omeprazole dose plan to d/c in 6 months

## 2017-01-13 NOTE — Assessment & Plan Note (Addendum)
Requires shower chair  for safety, increased fall risk due to severe arthritis and disc disease of spine

## 2017-01-13 NOTE — Assessment & Plan Note (Signed)
Increased fall risk due to established  Multilevel disease with nerve root compression, uses cane for safe ambulation Requires shower chair for in home safety with bath

## 2017-01-13 NOTE — Assessment & Plan Note (Signed)
Controlled, no change in medication Lisa Mann is reminded of the importance of commitment to daily physical activity for 30 minutes or more, as able and the need to limit carbohydrate intake to 30 to 60 grams per meal to help with blood sugar control.   The need to take medication as prescribed, test blood sugar as directed, and to call between visits if there is a concern that blood sugar is uncontrolled is also discussed.   Lisa Mann is reminded of the importance of daily foot exam, annual eye examination, and good blood sugar, blood pressure and cholesterol control.  Diabetic Labs Latest Ref Rng & Units 10/08/2016 10/01/2016 05/22/2016 03/20/2016 11/22/2015  HbA1c <5.7 % of total Hgb - 6.6(H) - 6.5(H) -  Microalbumin mg/dL 0.3 - - - -  Micro/Creat Ratio <30 mcg/mg creat 6 - - - -  Chol <200 mg/dL - 170 - 159 -  HDL >50 mg/dL - 60 - 49(L) -  Calc LDL <100 mg/dL - - - 78 -  Triglycerides <150 mg/dL - 88 - 161(H) -  Creatinine 0.60 - 0.88 mg/dL - 0.59(L) 0.69 0.58(L) 0.77   BP/Weight 01/13/2017 12/10/2016 12/05/2016 11/05/2016 10/08/2016 08/07/2016 9/40/7680  Systolic BP 881 103 159 458 592 924 462  Diastolic BP 74 74 65 70 70 68 71  Wt. (Lbs) 187 186 192 176.5 190.75 190 188  BMI 33.13 32.95 34.01 31.27 33.79 33.66 33.3   Foot/eye exam completion dates Latest Ref Rng & Units 01/13/2017 11/12/2016  Eye Exam No Retinopathy - No Retinopathy  Foot exam Order - - -  Foot Form Completion - Done -

## 2017-01-13 NOTE — Patient Instructions (Addendum)
F/u end March, call if you need me sooner  Rectal exam today  Foot exam qualifies you for diabetic shoes and also for shower chair   Fasting lipid, cmp and eGFr, hBA1C and vit D 5 days before next visit  It is important that you exercise regularly at least 30 minutes 5 times a week. If you develop chest pain, have severe difficulty breathing, or feel very tired, stop exercising immediately and seek medical attention  Please work on good  health habits so that your health will improve. 1. Commitment to daily physical activity for 30 to 60  minutes, if you are able to do this.  2. Commitment to wise food choices. Aim for half of your  food intake to be vegetable and fruit, one quarter starchy foods, and one quarter protein. Try to eat on a regular schedule  3 meals per day, snacking between meals should be limited to vegetables or fruits or small portions of nuts. 64 ounces of water per day is generally recommended, unless you have specific health conditions, like heart failure or kidney failure where you will need to limit fluid intake.  3. Commitment to sufficient and a  good quality of physical and mental rest daily, generally between 6 to 8 hours per day.  WITH PERSISTANCE AND PERSEVERANCE, THE IMPOSSIBLE , BECOMES THE NORM! Thank you  for choosing [facility]. We consider it a privelige to serve you.  Delivering excellent health care in a caring and  compassionate way is our goal.  Partnering with you,  so that together we can achieve this goal is our strategy.

## 2017-01-13 NOTE — Assessment & Plan Note (Signed)
Controlled, no change in medication DASH diet and commitment to daily physical activity for a minimum of 30 minutes discussed and encouraged, as a part of hypertension management. The importance of attaining a healthy weight is also discussed.  BP/Weight 01/13/2017 12/10/2016 12/05/2016 11/05/2016 10/08/2016 08/07/2016 6/96/7893  Systolic BP 810 175 102 585 277 824 235  Diastolic BP 74 74 65 70 70 68 71  Wt. (Lbs) 187 186 192 176.5 190.75 190 188  BMI 33.13 32.95 34.01 31.27 33.79 33.66 33.3

## 2017-01-14 ENCOUNTER — Encounter: Payer: Self-pay | Admitting: Family Medicine

## 2017-01-14 DIAGNOSIS — L851 Acquired keratosis [keratoderma] palmaris et plantaris: Secondary | ICD-10-CM | POA: Diagnosis not present

## 2017-01-14 DIAGNOSIS — B351 Tinea unguium: Secondary | ICD-10-CM | POA: Diagnosis not present

## 2017-01-14 DIAGNOSIS — E1142 Type 2 diabetes mellitus with diabetic polyneuropathy: Secondary | ICD-10-CM | POA: Diagnosis not present

## 2017-01-14 LAB — POC HEMOCCULT BLD/STL (OFFICE/1-CARD/DIAGNOSTIC): Fecal Occult Blood, POC: NEGATIVE

## 2017-01-14 NOTE — Assessment & Plan Note (Signed)
Hyperlipidemia:Low fat diet discussed and encouraged.   Lipid Panel  Lab Results  Component Value Date   CHOL 170 10/01/2016   HDL 60 10/01/2016   LDLCALC 78 03/20/2016   TRIG 88 10/01/2016   CHOLHDL 2.8 10/01/2016   Updated lab needed at/ before next visit.

## 2017-01-14 NOTE — Assessment & Plan Note (Signed)
Deteriorated. Patient re-educated about  the importance of commitment to a  minimum of 150 minutes of exercise per week.  The importance of healthy food choices with portion control discussed. Encouraged to start a food diary, count calories and to consider  joining a support group. Sample diet sheets offered. Goals set by the patient for the next several months.   Weight /BMI 01/13/2017 12/10/2016 12/05/2016  WEIGHT 187 lb 186 lb 192 lb  HEIGHT 5\' 3"  5\' 3"  -  BMI 33.13 kg/m2 32.95 kg/m2 34.01 kg/m2

## 2017-01-14 NOTE — Addendum Note (Signed)
Addended by: Eual Fines on: 01/14/2017 08:02 AM   Modules accepted: Orders

## 2017-01-16 ENCOUNTER — Ambulatory Visit (INDEPENDENT_AMBULATORY_CARE_PROVIDER_SITE_OTHER): Payer: Medicare Other | Admitting: Orthopaedic Surgery

## 2017-01-16 ENCOUNTER — Encounter: Payer: Self-pay | Admitting: Orthopaedic Surgery

## 2017-01-16 VITALS — BP 119/79 | HR 92 | Ht 63.0 in | Wt 186.0 lb

## 2017-01-16 DIAGNOSIS — M5442 Lumbago with sciatica, left side: Secondary | ICD-10-CM | POA: Diagnosis not present

## 2017-01-16 DIAGNOSIS — G8929 Other chronic pain: Secondary | ICD-10-CM | POA: Diagnosis not present
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
## 2017-01-28 DIAGNOSIS — M438X6 Other specified deforming dorsopathies, lumbar region: Secondary | ICD-10-CM | POA: Diagnosis not present

## 2017-01-28 DIAGNOSIS — M4319 Spondylolisthesis, multiple sites in spine: Secondary | ICD-10-CM | POA: Diagnosis not present

## 2017-01-28 DIAGNOSIS — M2428 Disorder of ligament, vertebrae: Secondary | ICD-10-CM | POA: Diagnosis not present

## 2017-01-28 DIAGNOSIS — M5116 Intervertebral disc disorders with radiculopathy, lumbar region: Secondary | ICD-10-CM | POA: Diagnosis not present

## 2017-01-28 DIAGNOSIS — M4726 Other spondylosis with radiculopathy, lumbar region: Secondary | ICD-10-CM | POA: Diagnosis not present

## 2017-01-30 ENCOUNTER — Encounter: Payer: Self-pay | Admitting: Orthopaedic Surgery

## 2017-01-30 ENCOUNTER — Ambulatory Visit (INDEPENDENT_AMBULATORY_CARE_PROVIDER_SITE_OTHER): Payer: Medicare Other | Admitting: Orthopaedic Surgery

## 2017-01-30 VITALS — BP 129/81 | HR 98 | Ht 63.0 in | Wt 188.0 lb

## 2017-01-30 DIAGNOSIS — M5442 Lumbago with sciatica, left side: Secondary | ICD-10-CM | POA: Diagnosis not present

## 2017-01-30 DIAGNOSIS — G8929 Other chronic pain: Secondary | ICD-10-CM
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
## 2017-02-27 ENCOUNTER — Ambulatory Visit (INDEPENDENT_AMBULATORY_CARE_PROVIDER_SITE_OTHER): Payer: Medicare Other | Admitting: Orthopaedic Surgery

## 2017-02-27 ENCOUNTER — Encounter: Payer: Self-pay | Admitting: Orthopaedic Surgery

## 2017-02-27 VITALS — BP 130/71 | HR 103 | Temp 98.0°F | Ht 63.0 in | Wt 184.0 lb

## 2017-02-27 DIAGNOSIS — M5442 Lumbago with sciatica, left side: Secondary | ICD-10-CM

## 2017-02-27 DIAGNOSIS — G8929 Other chronic pain: Secondary | ICD-10-CM
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
## 2017-03-02 ENCOUNTER — Other Ambulatory Visit: Payer: Self-pay | Admitting: Family Medicine

## 2017-03-19 ENCOUNTER — Telehealth: Payer: Self-pay | Admitting: Family Medicine

## 2017-03-19 NOTE — Telephone Encounter (Signed)
The patient must call and verify if they are indeed requesting products from these companies before anything will be sent

## 2017-03-19 NOTE — Telephone Encounter (Signed)
[facility] called in to request a refill for alcohol pads for patient Says they sent a request 03/17/17 by fax.  Fax#: [phone]

## 2017-03-28 ENCOUNTER — Telehealth: Payer: Self-pay

## 2017-03-28 NOTE — Telephone Encounter (Signed)
Lisa Mann called wanting a rx for alcohol pads.  She said this was faxed on 03/24/17.  The call back # is [phone]

## 2017-03-31 NOTE — Telephone Encounter (Signed)
We do not respond to these requests unless the patient calls about this directly

## 2017-04-01 ENCOUNTER — Ambulatory Visit (INDEPENDENT_AMBULATORY_CARE_PROVIDER_SITE_OTHER): Payer: Medicare Other | Admitting: Family Medicine

## 2017-04-01 ENCOUNTER — Encounter: Payer: Self-pay | Admitting: Family Medicine

## 2017-04-01 ENCOUNTER — Other Ambulatory Visit: Payer: Self-pay | Admitting: Family Medicine

## 2017-04-01 VITALS — BP 128/78 | HR 93 | Resp 16 | Ht 63.0 in | Wt 185.0 lb

## 2017-04-01 DIAGNOSIS — E669 Obesity, unspecified: Secondary | ICD-10-CM | POA: Diagnosis not present

## 2017-04-01 DIAGNOSIS — I10 Essential (primary) hypertension: Secondary | ICD-10-CM

## 2017-04-01 DIAGNOSIS — B351 Tinea unguium: Secondary | ICD-10-CM | POA: Diagnosis not present

## 2017-04-01 DIAGNOSIS — E1142 Type 2 diabetes mellitus with diabetic polyneuropathy: Secondary | ICD-10-CM | POA: Diagnosis not present

## 2017-04-01 DIAGNOSIS — L851 Acquired keratosis [keratoderma] palmaris et plantaris: Secondary | ICD-10-CM | POA: Diagnosis not present

## 2017-04-01 DIAGNOSIS — E1140 Type 2 diabetes mellitus with diabetic neuropathy, unspecified: Secondary | ICD-10-CM

## 2017-04-01 DIAGNOSIS — K219 Gastro-esophageal reflux disease without esophagitis: Secondary | ICD-10-CM

## 2017-04-01 MED ORDER — OMEPRAZOLE 40 MG PO CPDR: 40.0000 mg | DELAYED_RELEASE_CAPSULE | Freq: Every day | ORAL | 3 refills | Status: DC

## 2017-04-01 NOTE — Patient Instructions (Addendum)
Wellness with nurse August 2 or after  MD follow up in 5.5  months, call if you need me sooner  Please get fasting labs THIS week, we will give you the lab sheet today before you leave  INCREASED dose of omeprazole, start today, CALL in 2 months if you still have food sticking or bad reflux symptoms, I will refer you to Dr Laural Golden  Careful not to fall , enjoy picking greens safely!  Thank you  for choosing [facility]. We consider it a privelige to serve you.  Delivering excellent health care in a caring and  compassionate way is our goal.  Partnering with you,  so that together we can achieve this goal is our strategy.

## 2017-04-01 NOTE — Progress Notes (Signed)
Lisa Mann     MRN: [id number]      DOB: 08-20-1933   HPI Lisa Mann is here for follow up and re-evaluation of chronic medical conditions, medication management and review of any available recent lab and radiology data.  Preventive health is updated, specifically  Cancer screening and Immunization.   The PT denies any adverse reactions to current medications since the last visit.  3 week h/o solid and liquid dysphagia , however increased reflux symptoms since Jan with dose reduction of PPI , will increase dose and see if this corrects  FBG is 120 to  126  ROS Denies recent fever or chills. Denies sinus pressure, nasal congestion, ear pain or sore throat. Denies chest congestion, productive cough or wheezing. Denies chest pains, palpitations and leg swelling Denies abdominal pain, nausea, vomiting,diarrhea or constipation.   Denies dysuria, frequency, hesitancy or incontinence. Denies uncontrolled  joint pain, swelling and limitation in mobility. Denies headaches, seizures, numbness, or tingling. Denies depression, anxiety or insomnia. Denies skin break down or rash.   PE  BP 128/78   Pulse 93   Resp 16   Ht 5\' 3"  (1.6 m)   Wt 185 lb (83.9 kg)   SpO2 96%   BMI 32.77 kg/m   Patient alert and oriented and in no cardiopulmonary distress.  HEENT: No facial asymmetry, EOMI,   oropharynx pink and moist.  Neck supple no JVD, no mass.  Chest: Clear to auscultation bilaterally.  CVS: S1, S2 no murmurs, no S3.Regular rate.  ABD: Soft non tender.   Ext: No edema  MS: Adequate ROM spine, shoulders, hips and knees.  Skin: Intact, no ulcerations or rash noted.  Psych: Good eye contact, normal affect. Memory intact not anxious or depressed appearing.  CNS: CN 2-12 intact, power,  normal throughout.no focal deficits noted.   Assessment & Plan  GERD Uncontrolled symptoms with solid dysphagia x 3 months with dose reduction in omeprazole. Pt to resume previous dose and  call in next 2 to 3 weeks if she remains symptomatic for GI eval and management  HTN, goal below 130/80 Controlled, no change in medication DASH diet and commitment to daily physical activity for a minimum of 30 minutes discussed and encouraged, as a part of hypertension management. The importance of attaining a healthy weight is also discussed.  BP/Weight 04/01/2017 02/27/2017 01/30/2017 01/16/2017 01/13/2017 12/10/2016 58/09/9831  Systolic BP 825 053 976 734 193 790 240  Diastolic BP 78 71 81 79 74 74 65  Wt. (Lbs) 185 184 188 186 187 186 192  BMI 32.77 32.59 33.3 32.95 33.13 32.95 34.01       Diabetes mellitus with neuropathy Lisa Mann is reminded of the importance of commitment to daily physical activity for 30 minutes or more, as able and the need to limit carbohydrate intake to 30 to 60 grams per meal to help with blood sugar control.   The need to take medication as prescribed, test blood sugar as directed, and to call between visits if there is a concern that blood sugar is uncontrolled is also discussed.   Lisa Mann is reminded of the importance of daily foot exam, annual eye examination, and good blood sugar, blood pressure and cholesterol control. Controlled, no change in medication   Diabetic Labs Latest Ref Rng & Units 04/03/2017 10/08/2016 10/01/2016 05/22/2016 03/20/2016  HbA1c <5.7 % of total Hgb 6.6(H) - 6.6(H) - 6.5(H)  Microalbumin mg/dL - 0.3 - - -  Micro/Creat Ratio <30 mcg/mg  creat - 6 - - -  Chol <200 mg/dL 157 - 170 - 159  HDL >50 mg/dL 53 - 60 - 49(L)  Calc LDL mg/dL (calc) 87 - 92 - 78  Triglycerides <150 mg/dL 77 - 88 - 161(H)  Creatinine 0.60 - 0.88 mg/dL 0.65 - 0.59(L) 0.69 0.58(L)   BP/Weight 04/01/2017 02/27/2017 01/30/2017 01/16/2017 01/13/2017 12/10/2016 03/49/1791  Systolic BP 505 697 948 016 553 748 270  Diastolic BP 78 71 81 79 74 74 65  Wt. (Lbs) 185 184 188 186 187 186 192  BMI 32.77 32.59 33.3 32.95 33.13 32.95 34.01   Foot/eye exam completion dates  Latest Ref Rng & Units 01/13/2017 11/12/2016  Eye Exam No Retinopathy - No Retinopathy  Foot exam Order - - -  Foot Form Completion - Done -        Obesity (BMI 30.0-34.9) Unchnaged. Patient re-educated about  the importance of commitment to a  minimum of 150 minutes of exercise per week.  The importance of healthy food choices with portion control discussed. red. Goals set by the patient for the next several months.   Weight /BMI 04/01/2017 02/27/2017 01/30/2017  WEIGHT 185 lb 184 lb 188 lb  HEIGHT 5\' 3"  5\' 3"  5\' 3"   BMI 32.77 kg/m2 32.59 kg/m2 33.3 kg/m2

## 2017-04-03 DIAGNOSIS — E559 Vitamin D deficiency, unspecified: Secondary | ICD-10-CM | POA: Diagnosis not present

## 2017-04-03 DIAGNOSIS — I10 Essential (primary) hypertension: Secondary | ICD-10-CM | POA: Diagnosis not present

## 2017-04-03 DIAGNOSIS — E785 Hyperlipidemia, unspecified: Secondary | ICD-10-CM | POA: Diagnosis not present

## 2017-04-03 DIAGNOSIS — E1140 Type 2 diabetes mellitus with diabetic neuropathy, unspecified: Secondary | ICD-10-CM | POA: Diagnosis not present

## 2017-04-03 DIAGNOSIS — E790 Hyperuricemia without signs of inflammatory arthritis and tophaceous disease: Secondary | ICD-10-CM | POA: Diagnosis not present

## 2017-04-04 LAB — LIPID PANEL
Cholesterol: 157 mg/dL (ref <200)
HDL: 53 mg/dL (ref >50)
LDL Cholesterol (Calc): 87 mg/dL (calc)
Non-HDL Cholesterol (Calc): 104 mg/dL (calc) (ref <130)
Total CHOL/HDL Ratio: 3 (calc) (ref <5.0)
Triglycerides: 77 mg/dL (ref <150)

## 2017-04-04 LAB — COMPLETE METABOLIC PANEL WITH GFR
AG Ratio: 1.6 (calc) (ref 1.0–2.5)
ALT: 23 U/L (ref 6–29)
AST: 21 U/L (ref 10–35)
Albumin: 4.2 g/dL (ref 3.6–5.1)
Alkaline phosphatase (APISO): 49 U/L (ref 33–130)
BUN: 16 mg/dL (ref 7–25)
CO2: 31 mmol/L (ref 20–32)
Calcium: 9.7 mg/dL (ref 8.6–10.4)
Chloride: 99 mmol/L (ref 98–110)
Creat: 0.65 mg/dL (ref 0.60–0.88)
GFR, Est African American: 97 mL/min/{1.73_m2} (ref >=60)
GFR, Est Non African American: 83 mL/min/{1.73_m2} (ref >=60)
Globulin: 2.7 g/dL (calc) (ref 1.9–3.7)
Glucose, Bld: 112 mg/dL — ABNORMAL HIGH (ref 65–99)
Potassium: 4.2 mmol/L (ref 3.5–5.3)
Sodium: 137 mmol/L (ref 135–146)
Total Bilirubin: 0.4 mg/dL (ref 0.2–1.2)
Total Protein: 6.9 g/dL (ref 6.1–8.1)

## 2017-04-04 LAB — HEMOGLOBIN A1C
Hgb A1c MFr Bld: 6.6 % of total Hgb — ABNORMAL HIGH (ref <5.7)
Mean Plasma Glucose: 143 (calc)
eAG (mmol/L): 7.9 (calc)

## 2017-04-04 LAB — URIC ACID: Uric Acid, Serum: 3.2 mg/dL (ref 2.5–7.0)

## 2017-04-04 LAB — VITAMIN D 25 HYDROXY (VIT D DEFICIENCY, FRACTURES): Vit D, 25-Hydroxy: 26 ng/mL — ABNORMAL LOW (ref 30–100)

## 2017-04-04 NOTE — Progress Notes (Signed)
Results mailed 

## 2017-04-05 ENCOUNTER — Encounter: Payer: Self-pay | Admitting: Family Medicine

## 2017-04-05 NOTE — Assessment & Plan Note (Signed)
Uncontrolled symptoms with solid dysphagia x 3 months with dose reduction in omeprazole. Pt to resume previous dose and call in next 2 to 3 weeks if she remains symptomatic for GI eval and management

## 2017-04-05 NOTE — Assessment & Plan Note (Signed)
Controlled, no change in medication DASH diet and commitment to daily physical activity for a minimum of 30 minutes discussed and encouraged, as a part of hypertension management. The importance of attaining a healthy weight is also discussed.  BP/Weight 04/01/2017 02/27/2017 01/30/2017 01/16/2017 01/13/2017 12/10/2016 51/02/5850  Systolic BP 778 242 353 614 431 540 086  Diastolic BP 78 71 81 79 74 74 65  Wt. (Lbs) 185 184 188 186 187 186 192  BMI 32.77 32.59 33.3 32.95 33.13 32.95 34.01

## 2017-04-05 NOTE — Assessment & Plan Note (Signed)
Lisa Mann is reminded of the importance of commitment to daily physical activity for 30 minutes or more, as able and the need to limit carbohydrate intake to 30 to 60 grams per meal to help with blood sugar control.   The need to take medication as prescribed, test blood sugar as directed, and to call between visits if there is a concern that blood sugar is uncontrolled is also discussed.   Lisa Mann is reminded of the importance of daily foot exam, annual eye examination, and good blood sugar, blood pressure and cholesterol control. Controlled, no change in medication   Diabetic Labs Latest Ref Rng & Units 04/03/2017 10/08/2016 10/01/2016 05/22/2016 03/20/2016  HbA1c <5.7 % of total Hgb 6.6(H) - 6.6(H) - 6.5(H)  Microalbumin mg/dL - 0.3 - - -  Micro/Creat Ratio <30 mcg/mg creat - 6 - - -  Chol <200 mg/dL 157 - 170 - 159  HDL >50 mg/dL 53 - 60 - 49(L)  Calc LDL mg/dL (calc) 87 - 92 - 78  Triglycerides <150 mg/dL 77 - 88 - 161(H)  Creatinine 0.60 - 0.88 mg/dL 0.65 - 0.59(L) 0.69 0.58(L)   BP/Weight 04/01/2017 02/27/2017 01/30/2017 01/16/2017 01/13/2017 12/10/2016 59/56/3875  Systolic BP 643 329 518 841 660 630 160  Diastolic BP 78 71 81 79 74 74 65  Wt. (Lbs) 185 184 188 186 187 186 192  BMI 32.77 32.59 33.3 32.95 33.13 32.95 34.01   Foot/eye exam completion dates Latest Ref Rng & Units 01/13/2017 11/12/2016  Eye Exam No Retinopathy - No Retinopathy  Foot exam Order - - -  Foot Form Completion - Done -

## 2017-04-05 NOTE — Assessment & Plan Note (Signed)
Unchnaged. Patient re-educated about  the importance of commitment to a  minimum of 150 minutes of exercise per week.  The importance of healthy food choices with portion control discussed. red. Goals set by the patient for the next several months.   Weight /BMI 04/01/2017 02/27/2017 01/30/2017  WEIGHT 185 lb 184 lb 188 lb  HEIGHT 5\' 3"  5\' 3"  5\' 3"   BMI 32.77 kg/m2 32.59 kg/m2 33.3 kg/m2

## 2017-05-23 ENCOUNTER — Ambulatory Visit (HOSPITAL_COMMUNITY): Payer: Medicare Other | Admitting: Adult Health

## 2017-05-23 ENCOUNTER — Other Ambulatory Visit (HOSPITAL_COMMUNITY): Payer: Medicare Other

## 2017-05-23 ENCOUNTER — Inpatient Hospital Stay (HOSPITAL_COMMUNITY): Payer: Medicare Other | Attending: Adult Health

## 2017-05-23 ENCOUNTER — Inpatient Hospital Stay (HOSPITAL_BASED_OUTPATIENT_CLINIC_OR_DEPARTMENT_OTHER): Payer: Medicare Other | Admitting: Internal Medicine

## 2017-05-23 ENCOUNTER — Other Ambulatory Visit: Payer: Self-pay

## 2017-05-23 ENCOUNTER — Encounter (HOSPITAL_COMMUNITY): Payer: Self-pay | Admitting: Internal Medicine

## 2017-05-23 VITALS — BP 149/67 | HR 80 | Temp 97.5°F | Resp 16 | Wt 184.0 lb

## 2017-05-23 DIAGNOSIS — Z853 Personal history of malignant neoplasm of breast: Secondary | ICD-10-CM

## 2017-05-23 DIAGNOSIS — Z79899 Other long term (current) drug therapy: Secondary | ICD-10-CM | POA: Insufficient documentation

## 2017-05-23 DIAGNOSIS — M1990 Unspecified osteoarthritis, unspecified site: Secondary | ICD-10-CM | POA: Diagnosis not present

## 2017-05-23 DIAGNOSIS — M109 Gout, unspecified: Secondary | ICD-10-CM | POA: Diagnosis not present

## 2017-05-23 DIAGNOSIS — E785 Hyperlipidemia, unspecified: Secondary | ICD-10-CM | POA: Insufficient documentation

## 2017-05-23 DIAGNOSIS — Z7982 Long term (current) use of aspirin: Secondary | ICD-10-CM | POA: Insufficient documentation

## 2017-05-23 DIAGNOSIS — Z9221 Personal history of antineoplastic chemotherapy: Secondary | ICD-10-CM | POA: Diagnosis not present

## 2017-05-23 DIAGNOSIS — C50912 Malignant neoplasm of unspecified site of left female breast: Principal | ICD-10-CM

## 2017-05-23 DIAGNOSIS — E669 Obesity, unspecified: Secondary | ICD-10-CM | POA: Insufficient documentation

## 2017-05-23 DIAGNOSIS — Z171 Estrogen receptor negative status [ER-]: Secondary | ICD-10-CM | POA: Insufficient documentation

## 2017-05-23 DIAGNOSIS — K219 Gastro-esophageal reflux disease without esophagitis: Secondary | ICD-10-CM | POA: Insufficient documentation

## 2017-05-23 DIAGNOSIS — Z7984 Long term (current) use of oral hypoglycemic drugs: Secondary | ICD-10-CM | POA: Diagnosis not present

## 2017-05-23 DIAGNOSIS — C50911 Malignant neoplasm of unspecified site of right female breast: Secondary | ICD-10-CM

## 2017-05-23 DIAGNOSIS — E1140 Type 2 diabetes mellitus with diabetic neuropathy, unspecified: Secondary | ICD-10-CM | POA: Insufficient documentation

## 2017-05-23 DIAGNOSIS — I10 Essential (primary) hypertension: Secondary | ICD-10-CM | POA: Diagnosis not present

## 2017-05-23 LAB — COMPREHENSIVE METABOLIC PANEL
ALT: 32 U/L (ref 14–54)
AST: 28 U/L (ref 15–41)
Albumin: 4.3 g/dL (ref 3.5–5.0)
Alkaline Phosphatase: 47 U/L (ref 38–126)
Anion gap: 9 (ref 5–15)
BUN: 19 mg/dL (ref 6–20)
CO2: 31 mmol/L (ref 22–32)
Calcium: 9.8 mg/dL (ref 8.9–10.3)
Chloride: 96 mmol/L — ABNORMAL LOW (ref 101–111)
Creatinine, Ser: 0.61 mg/dL (ref 0.44–1.00)
GFR calc Af Amer: >60 mL/min (ref >60)
GFR calc non Af Amer: >60 mL/min (ref >60)
Glucose, Bld: 117 mg/dL — ABNORMAL HIGH (ref 65–99)
Potassium: 4.6 mmol/L (ref 3.5–5.1)
Sodium: 136 mmol/L (ref 135–145)
Total Bilirubin: 0.5 mg/dL (ref 0.3–1.2)
Total Protein: 7.6 g/dL (ref 6.5–8.1)

## 2017-05-23 LAB — CBC WITH DIFFERENTIAL/PLATELET
Basophils Absolute: 0 10*3/uL (ref 0.0–0.1)
Basophils Relative: 1 %
Eosinophils Absolute: 0.1 10*3/uL (ref 0.0–0.7)
Eosinophils Relative: 2 %
HCT: 38 % (ref 36.0–46.0)
Hemoglobin: 12.4 g/dL (ref 12.0–15.0)
Lymphocytes Relative: 35 %
Lymphs Abs: 2.1 10*3/uL (ref 0.7–4.0)
MCH: 29.1 pg (ref 26.0–34.0)
MCHC: 32.6 g/dL (ref 30.0–36.0)
MCV: 89.2 fL (ref 78.0–100.0)
Monocytes Absolute: 0.5 10*3/uL (ref 0.1–1.0)
Monocytes Relative: 8 %
Neutro Abs: 3.3 10*3/uL (ref 1.7–7.7)
Neutrophils Relative %: 54 %
Platelets: 301 10*3/uL (ref 150–400)
RBC: 4.26 MIL/uL (ref 3.87–5.11)
RDW: 14.8 % (ref 11.5–15.5)
WBC: 6 10*3/uL (ref 4.0–10.5)
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
## 2017-06-10 DIAGNOSIS — L851 Acquired keratosis [keratoderma] palmaris et plantaris: Secondary | ICD-10-CM | POA: Diagnosis not present

## 2017-06-10 DIAGNOSIS — B351 Tinea unguium: Secondary | ICD-10-CM | POA: Diagnosis not present

## 2017-06-10 DIAGNOSIS — E1142 Type 2 diabetes mellitus with diabetic polyneuropathy: Secondary | ICD-10-CM | POA: Diagnosis not present

## 2017-06-23 ENCOUNTER — Other Ambulatory Visit: Payer: Self-pay | Admitting: Family Medicine

## 2017-07-14 ENCOUNTER — Ambulatory Visit (INDEPENDENT_AMBULATORY_CARE_PROVIDER_SITE_OTHER): Payer: Medicare Other | Admitting: Otolaryngology

## 2017-07-14 DIAGNOSIS — D440 Neoplasm of uncertain behavior of thyroid gland: Secondary | ICD-10-CM

## 2017-07-14 DIAGNOSIS — H903 Sensorineural hearing loss, bilateral: Secondary | ICD-10-CM | POA: Diagnosis not present

## 2017-08-11 ENCOUNTER — Ambulatory Visit (INDEPENDENT_AMBULATORY_CARE_PROVIDER_SITE_OTHER): Payer: Medicare Other

## 2017-08-11 VITALS — BP 130/72 | HR 91 | Resp 16 | Ht 63.0 in | Wt 187.8 lb

## 2017-08-11 DIAGNOSIS — Z0000 Encounter for general adult medical examination without abnormal findings: Secondary | ICD-10-CM
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
## 2017-08-11 NOTE — Patient Instructions (Addendum)
Lisa Mann , Thank you for taking time to come for your Medicare Wellness Visit. I appreciate your ongoing commitment to your health goals. Please review the following plan we discussed and let me know if I can assist you in the future.   Flu clinic on Fridays in Sept from 8-12p    Screening recommendations/referrals: Colonoscopy: Done with screening  Mammogram: Done with screening  Bone Density: Done in 2013 Recommended yearly ophthalmology/optometry visit for glaucoma screening and checkup Recommended yearly dental visit for hygiene and checkup  Vaccinations: Influenza vaccine: Due in Sept  Pneumococcal vaccine: Done  Tdap vaccine: Done Shingles vaccine: ask insurance if shingrix is covered    Advanced directives: will give info   Conditions/risks identified: done   Next appointment: Oct 13, 2017 at 10:20am   Preventive Care 82 Years and Older, Female Preventive care refers to lifestyle choices and visits with your health care provider that can promote health and wellness. What does preventive care include?  A yearly physical exam. This is also called an annual well check.  Dental exams once or twice a year.  Routine eye exams. Ask your health care provider how often you should have your eyes checked.  Personal lifestyle choices, including:  Daily care of your teeth and gums.  Regular physical activity.  Eating a healthy diet.  Avoiding tobacco and drug use.  Limiting alcohol use.  Practicing safe sex.  Taking low-dose aspirin every day.  Taking vitamin and mineral supplements as recommended by your health care provider. What happens during an annual well check? The services and screenings done by your health care provider during your annual well check will depend on your age, overall health, lifestyle risk factors, and family history of disease. Counseling  Your health care provider may ask you questions about your:  Alcohol use.  Tobacco use.  Drug  use.  Emotional well-being.  Home and relationship well-being.  Sexual activity.  Eating habits.  History of falls.  Memory and ability to understand (cognition).  Work and work Statistician.  Reproductive health. Screening  You may have the following tests or measurements:  Height, weight, and BMI.  Blood pressure.  Lipid and cholesterol levels. These may be checked every 5 years, or more frequently if you are over 79 years old.  Skin check.  Lung cancer screening. You may have this screening every year starting at age 5 if you have a 30-pack-year history of smoking and currently smoke or have quit within the past 15 years.  Fecal occult blood test (FOBT) of the stool. You may have this test every year starting at age 21.  Flexible sigmoidoscopy or colonoscopy. You may have a sigmoidoscopy every 5 years or a colonoscopy every 10 years starting at age 48.  Hepatitis C blood test.  Hepatitis B blood test.  Sexually transmitted disease (STD) testing.  Diabetes screening. This is done by checking your blood sugar (glucose) after you have not eaten for a while (fasting). You may have this done every 1-3 years.  Bone density scan. This is done to screen for osteoporosis. You may have this done starting at age 77.  Mammogram. This may be done every 1-2 years. Talk to your health care provider about how often you should have regular mammograms. Talk with your health care provider about your test results, treatment options, and if necessary, the need for more tests. Vaccines  Your health care provider may recommend certain vaccines, such as:  Influenza vaccine. This is recommended every  year.  Tetanus, diphtheria, and acellular pertussis (Tdap, Td) vaccine. You may need a Td booster every 10 years.  Zoster vaccine. You may need this after age 78.  Pneumococcal 13-valent conjugate (PCV13) vaccine. One dose is recommended after age 28.  Pneumococcal polysaccharide  (PPSV23) vaccine. One dose is recommended after age 43. Talk to your health care provider about which screenings and vaccines you need and how often you need them. This information is not intended to replace advice given to you by your health care provider. Make sure you discuss any questions you have with your health care provider. Document Released: 01/20/2015 Document Revised: 09/13/2015 Document Reviewed: 10/25/2014 Elsevier Interactive Patient Education  2017 [employer] Prevention in the Home Falls can cause injuries. They can happen to people of all ages. There are many things you can do to make your home safe and to help prevent falls. What can I do on the outside of my home?  Regularly fix the edges of walkways and driveways and fix any cracks.  Remove anything that might make you trip as you walk through a door, such as a raised step or threshold.  Trim any bushes or trees on the path to your home.  Use bright outdoor lighting.  Clear any walking paths of anything that might make someone trip, such as rocks or tools.  Regularly check to see if handrails are loose or broken. Make sure that both sides of any steps have handrails.  Any raised decks and porches should have guardrails on the edges.  Have any leaves, snow, or ice cleared regularly.  Use sand or salt on walking paths during winter.  Clean up any spills in your garage right away. This includes oil or grease spills. What can I do in the bathroom?  Use night lights.  Install grab bars by the toilet and in the tub and shower. Do not use towel bars as grab bars.  Use non-skid mats or decals in the tub or shower.  If you need to sit down in the shower, use a plastic, non-slip stool.  Keep the floor dry. Clean up any water that spills on the floor as soon as it happens.  Remove soap buildup in the tub or shower regularly.  Attach bath mats securely with double-sided non-slip rug tape.  Do not have  throw rugs and other things on the floor that can make you trip. What can I do in the bedroom?  Use night lights.  Make sure that you have a light by your bed that is easy to reach.  Do not use any sheets or blankets that are too big for your bed. They should not hang down onto the floor.  Have a firm chair that has side arms. You can use this for support while you get dressed.  Do not have throw rugs and other things on the floor that can make you trip. What can I do in the kitchen?  Clean up any spills right away.  Avoid walking on wet floors.  Keep items that you use a lot in easy-to-reach places.  If you need to reach something above you, use a strong step stool that has a grab bar.  Keep electrical cords out of the way.  Do not use floor polish or wax that makes floors slippery. If you must use wax, use non-skid floor wax.  Do not have throw rugs and other things on the floor that can make you trip. What can  I do with my stairs?  Do not leave any items on the stairs.  Make sure that there are handrails on both sides of the stairs and use them. Fix handrails that are broken or loose. Make sure that handrails are as long as the stairways.  Check any carpeting to make sure that it is firmly attached to the stairs. Fix any carpet that is loose or worn.  Avoid having throw rugs at the top or bottom of the stairs. If you do have throw rugs, attach them to the floor with carpet tape.  Make sure that you have a light switch at the top of the stairs and the bottom of the stairs. If you do not have them, ask someone to add them for you. What else can I do to help prevent falls?  Wear shoes that:  Do not have high heels.  Have rubber bottoms.  Are comfortable and fit you well.  Are closed at the toe. Do not wear sandals.  If you use a stepladder:  Make sure that it is fully opened. Do not climb a closed stepladder.  Make sure that both sides of the stepladder are  locked into place.  Ask someone to hold it for you, if possible.  Clearly mark and make sure that you can see:  Any grab bars or handrails.  First and last steps.  Where the edge of each step is.  Use tools that help you move around (mobility aids) if they are needed. These include:  Canes.  Walkers.  Scooters.  Crutches.  Turn on the lights when you go into a dark area. Replace any light bulbs as soon as they burn out.  Set up your furniture so you have a clear path. Avoid moving your furniture around.  If any of your floors are uneven, fix them.  If there are any pets around you, be aware of where they are.  Review your medicines with your doctor. Some medicines can make you feel dizzy. This can increase your chance of falling. Ask your doctor what other things that you can do to help prevent falls. This information is not intended to replace advice given to you by your health care provider. Make sure you discuss any questions you have with your health care provider. Document Released: 10/20/2008 Document Revised: 06/01/2015 Document Reviewed: 01/28/2014 Elsevier Interactive Patient Education  2017 [employer].

## 2017-08-19 DIAGNOSIS — E1142 Type 2 diabetes mellitus with diabetic polyneuropathy: Secondary | ICD-10-CM | POA: Diagnosis not present

## 2017-08-19 DIAGNOSIS — L851 Acquired keratosis [keratoderma] palmaris et plantaris: Secondary | ICD-10-CM | POA: Diagnosis not present

## 2017-08-19 DIAGNOSIS — B351 Tinea unguium: Secondary | ICD-10-CM | POA: Diagnosis not present

## 2017-09-03 ENCOUNTER — Other Ambulatory Visit: Payer: Self-pay | Admitting: Family Medicine

## 2017-10-13 ENCOUNTER — Other Ambulatory Visit: Payer: Self-pay

## 2017-10-13 ENCOUNTER — Ambulatory Visit (INDEPENDENT_AMBULATORY_CARE_PROVIDER_SITE_OTHER): Payer: Medicare Other | Admitting: Family Medicine

## 2017-10-13 ENCOUNTER — Encounter: Payer: Self-pay | Admitting: Family Medicine

## 2017-10-13 VITALS — BP 120/82 | HR 100 | Resp 12 | Ht 63.0 in | Wt 190.0 lb

## 2017-10-13 DIAGNOSIS — E1140 Type 2 diabetes mellitus with diabetic neuropathy, unspecified: Secondary | ICD-10-CM | POA: Diagnosis not present

## 2017-10-13 DIAGNOSIS — M109 Gout, unspecified: Secondary | ICD-10-CM | POA: Diagnosis not present

## 2017-10-13 DIAGNOSIS — E785 Hyperlipidemia, unspecified: Secondary | ICD-10-CM | POA: Diagnosis not present

## 2017-10-13 DIAGNOSIS — I10 Essential (primary) hypertension: Secondary | ICD-10-CM | POA: Diagnosis not present

## 2017-10-13 DIAGNOSIS — Z23 Encounter for immunization: Secondary | ICD-10-CM

## 2017-10-13 DIAGNOSIS — E559 Vitamin D deficiency, unspecified: Secondary | ICD-10-CM

## 2017-10-13 DIAGNOSIS — E669 Obesity, unspecified: Secondary | ICD-10-CM | POA: Diagnosis not present

## 2017-10-13 NOTE — Progress Notes (Signed)
Lisa Mann     MRN: [id number]      DOB: [birth year]   HPI Lisa Mann is here for follow up and re-evaluation of chronic medical conditions, medication management and review of any available recent lab and radiology data.  Preventive health is updated, specifically  Cancer screening and Immunization.   Questions or concerns regarding consultations or procedures which the PT has had in the interim are  addressed. The PT denies any adverse reactions to current medications since the last visit.  Increased blood sugar this past week unclear cause, FCBG at 140's Denies polyuria, polydipsia, blurred vision , or hypoglycemic episodes.   ROS Denies recent fever or chills. Denies sinus pressure, nasal congestion, ear pain or sore throat. Denies chest congestion, productive cough or wheezing. Denies chest pains, palpitations and leg swelling Denies abdominal pain, nausea, vomiting,diarrhea or constipation.   Denies dysuria, frequency, hesitancy or incontinence. Denies joint pain, swelling and limitation in mobility. Denies headaches, seizures, numbness, or tingling. Denies depression, anxiety or insomnia. Denies skin break down or rash.   PE  BP 120/82   Pulse 100   Resp 12   Ht 5\' 3"  (1.6 m)   Wt 190 lb (86.2 kg)   SpO2 99% Comment: room air  BMI 33.66 kg/m   Patient alert and oriented and in no cardiopulmonary distress.  HEENT: No facial asymmetry, EOMI,   oropharynx pink and moist.  Neck supple no JVD, no mass.  Chest: Clear to auscultation bilaterally.  CVS: S1, S2 no murmurs, no S3.Regular rate.  ABD: Soft non tender.   Ext: No edema  MS: Adequate ROM spine, shoulders, hips and knees.  Skin: Intact, no ulcerations or rash noted.  Psych: Good eye contact, normal affect. Memory intact not anxious or depressed appearing.  CNS: CN 2-12 intact, power,  normal throughout.no focal deficits noted.   Assessment & Plan  HTN, goal below 130/80 Controlled, no change in  medication DASH diet and commitment to daily physical activity for a minimum of 30 minutes discussed and encouraged, as a part of hypertension management. The importance of attaining a healthy weight is also discussed.  BP/Weight 10/13/2017 08/11/2017 05/23/2017 04/01/2017 02/27/2017 01/30/2017 0/93/2671  Systolic BP 245 809 983 382 505 397 673  Diastolic BP 82 72 67 78 71 81 79  Wt. (Lbs) 190 187.8 184 185 184 188 186  BMI 33.66 33.27 32.59 32.77 32.59 33.3 32.95       Obesity (BMI 30.0-34.9) Deteriorated. Patient re-educated about  the importance of commitment to a  minimum of 150 minutes of exercise per week.  The importance of healthy food choices with portion control discussed. Encouraged to start a food diary, count calories and to consider  joining a support group. Sample diet sheets offered. Goals set by the patient for the next several months.   Weight /BMI 10/13/2017 08/11/2017 05/23/2017  WEIGHT 190 lb 187 lb 12.8 oz 184 lb  HEIGHT 5\' 3"  5\' 3"  -  BMI 33.66 kg/m2 33.27 kg/m2 32.59 kg/m2      Hyperlipidemia with target LDL less than 100 Updated lab needed at/ before next visit. Hyperlipidemia:Low fat diet discussed and encouraged.   Lipid Panel  Lab Results  Component Value Date   CHOL 157 04/03/2017   HDL 53 04/03/2017   LDLCALC 87 04/03/2017   TRIG 77 04/03/2017   CHOLHDL 3.0 04/03/2017       Diabetes mellitus with neuropathy Updated lab needed at/ before next visit. Lisa Mann is reminded  of the importance of commitment to daily physical activity for 30 minutes or more, as able and the need to limit carbohydrate intake to 30 to 60 grams per meal to help with blood sugar control.   The need to take medication as prescribed, test blood sugar as directed, and to call between visits if there is a concern that blood sugar is uncontrolled is also discussed.   Lisa Mann is reminded of the importance of daily foot exam, annual eye examination, and good blood sugar,  blood pressure and cholesterol control.  Diabetic Labs Latest Ref Rng & Units 05/23/2017 04/03/2017 10/08/2016 10/01/2016 05/22/2016  HbA1c <5.7 % of total Hgb - 6.6(H) - 6.6(H) -  Microalbumin mg/dL - - 0.3 - -  Micro/Creat Ratio <30 mcg/mg creat - - 6 - -  Chol <200 mg/dL - 157 - 170 -  HDL >50 mg/dL - 53 - 60 -  Calc LDL mg/dL (calc) - 87 - 92 -  Triglycerides <150 mg/dL - 77 - 88 -  Creatinine 0.44 - 1.00 mg/dL 0.61 0.65 - 0.59(L) 0.69   BP/Weight 10/13/2017 08/11/2017 05/23/2017 04/01/2017 02/27/2017 01/30/2017 0/35/0093  Systolic BP 818 299 371 696 789 381 017  Diastolic BP 82 72 67 78 71 81 79  Wt. (Lbs) 190 187.8 184 185 184 188 186  BMI 33.66 33.27 32.59 32.77 32.59 33.3 32.95   Foot/eye exam completion dates Latest Ref Rng & Units 01/13/2017 11/12/2016  Eye Exam No Retinopathy - No Retinopathy  Foot exam Order - - -  Foot Form Completion - Done -        Gout No recent flare, needs updated uric acid level

## 2017-10-13 NOTE — Assessment & Plan Note (Signed)
Deteriorated. Patient re-educated about  the importance of commitment to a  minimum of 150 minutes of exercise per week.  The importance of healthy food choices with portion control discussed. Encouraged to start a food diary, count calories and to consider  joining a support group. Sample diet sheets offered. Goals set by the patient for the next several months.   Weight /BMI 10/13/2017 08/11/2017 05/23/2017  WEIGHT 190 lb 187 lb 12.8 oz 184 lb  HEIGHT 5\' 3"  5\' 3"  -  BMI 33.66 kg/m2 33.27 kg/m2 32.59 kg/m2

## 2017-10-13 NOTE — Assessment & Plan Note (Signed)
Updated lab needed at/ before next visit. Hyperlipidemia:Low fat diet discussed and encouraged.   Lipid Panel  Lab Results  Component Value Date   CHOL 157 04/03/2017   HDL 53 04/03/2017   LDLCALC 87 04/03/2017   TRIG 77 04/03/2017   CHOLHDL 3.0 04/03/2017

## 2017-10-13 NOTE — Assessment & Plan Note (Signed)
Controlled, no change in medication DASH diet and commitment to daily physical activity for a minimum of 30 minutes discussed and encouraged, as a part of hypertension management. The importance of attaining a healthy weight is also discussed.  BP/Weight 10/13/2017 08/11/2017 05/23/2017 04/01/2017 02/27/2017 01/30/2017 7/50/5183  Systolic BP 358 251 898 421 031 281 188  Diastolic BP 82 72 67 78 71 81 79  Wt. (Lbs) 190 187.8 184 185 184 188 186  BMI 33.66 33.27 32.59 32.77 32.59 33.3 32.95

## 2017-10-13 NOTE — Assessment & Plan Note (Signed)
Updated lab needed at/ before next visit. Lisa Mann is reminded of the importance of commitment to daily physical activity for 30 minutes or more, as able and the need to limit carbohydrate intake to 30 to 60 grams per meal to help with blood sugar control.   The need to take medication as prescribed, test blood sugar as directed, and to call between visits if there is a concern that blood sugar is uncontrolled is also discussed.   Lisa Mann is reminded of the importance of daily foot exam, annual eye examination, and good blood sugar, blood pressure and cholesterol control.  Diabetic Labs Latest Ref Rng & Units 05/23/2017 04/03/2017 10/08/2016 10/01/2016 05/22/2016  HbA1c <5.7 % of total Hgb - 6.6(H) - 6.6(H) -  Microalbumin mg/dL - - 0.3 - -  Micro/Creat Ratio <30 mcg/mg creat - - 6 - -  Chol <200 mg/dL - 157 - 170 -  HDL >50 mg/dL - 53 - 60 -  Calc LDL mg/dL (calc) - 87 - 92 -  Triglycerides <150 mg/dL - 77 - 88 -  Creatinine 0.44 - 1.00 mg/dL 0.61 0.65 - 0.59(L) 0.69   BP/Weight 10/13/2017 08/11/2017 05/23/2017 04/01/2017 02/27/2017 01/30/2017 9/75/8832  Systolic BP 549 826 415 830 940 768 088  Diastolic BP 82 72 67 78 71 81 79  Wt. (Lbs) 190 187.8 184 185 184 188 186  BMI 33.66 33.27 32.59 32.77 32.59 33.3 32.95   Foot/eye exam completion dates Latest Ref Rng & Units 01/13/2017 11/12/2016  Eye Exam No Retinopathy - No Retinopathy  Foot exam Order - - -  Foot Form Completion - Done -

## 2017-10-13 NOTE — Assessment & Plan Note (Signed)
No recent flare, needs updated uric acid level

## 2017-10-13 NOTE — Patient Instructions (Addendum)
F/u and pt to be given 3 stool cards at that visit, in 4.5 months, call if you need me sooner  Flu vaccine today, alos microalb today if able, if not she is to get with labs  Please get this week fasting lipid, cmp and eGFR, hBA1C, tSH vit d and uric acid level, please drink sufficient  water every day, goal of 64 ounces  It is important that you exercise regularly at least 30 minutes 5 times a week. If you develop chest pain, have severe difficulty breathing, or feel very tired, stop exercising immediately and seek medical attention   Thank you  for choosing [facility]. We consider it a privelige to serve you.  Delivering excellent health care in a caring and  compassionate way is our goal.  Partnering with you,  so that together we can achieve this goal is our strategy.

## 2017-10-14 DIAGNOSIS — M109 Gout, unspecified: Secondary | ICD-10-CM | POA: Diagnosis not present

## 2017-10-14 DIAGNOSIS — E785 Hyperlipidemia, unspecified: Secondary | ICD-10-CM | POA: Diagnosis not present

## 2017-10-14 DIAGNOSIS — E669 Obesity, unspecified: Secondary | ICD-10-CM | POA: Diagnosis not present

## 2017-10-14 DIAGNOSIS — E559 Vitamin D deficiency, unspecified: Secondary | ICD-10-CM | POA: Diagnosis not present

## 2017-10-14 DIAGNOSIS — E1140 Type 2 diabetes mellitus with diabetic neuropathy, unspecified: Secondary | ICD-10-CM | POA: Diagnosis not present

## 2017-10-14 DIAGNOSIS — I10 Essential (primary) hypertension: Secondary | ICD-10-CM | POA: Diagnosis not present

## 2017-10-14 LAB — MICROALBUMIN, URINE: Microalb, Ur: 0.3 mg/dL

## 2017-10-15 ENCOUNTER — Telehealth: Payer: Self-pay

## 2017-10-15 DIAGNOSIS — E1140 Type 2 diabetes mellitus with diabetic neuropathy, unspecified: Secondary | ICD-10-CM

## 2017-10-15 DIAGNOSIS — I10 Essential (primary) hypertension: Secondary | ICD-10-CM

## 2017-10-15 NOTE — Telephone Encounter (Signed)
-----  Message from Fayrene Helper, MD sent at 10/15/2017  6:36 AM EDT ----- Pls add free T 3 and free T 4  Pls let pt know cholesterol, liver and kidney function good.Cholesterol and vit D are excellent Blood sugar has increased to 7, needs to reduce sweets and starchy foods  Needs non fast cmp and EGFr and hBa1C 1 week before Feb follow up, pls order

## 2017-10-16 LAB — LIPID PANEL
Cholesterol: 156 mg/dL (ref <200)
HDL: 53 mg/dL (ref >50)
LDL Cholesterol (Calc): 84 mg/dL (calc)
Non-HDL Cholesterol (Calc): 103 mg/dL (calc) (ref <130)
Total CHOL/HDL Ratio: 2.9 (calc) (ref <5.0)
Triglycerides: 92 mg/dL (ref <150)

## 2017-10-16 LAB — HEMOGLOBIN A1C
Hgb A1c MFr Bld: 7 % of total Hgb — ABNORMAL HIGH (ref <5.7)
Mean Plasma Glucose: 154 (calc)
eAG (mmol/L): 8.5 (calc)

## 2017-10-16 LAB — URIC ACID: Uric Acid, Serum: 3 mg/dL (ref 2.5–7.0)

## 2017-10-16 LAB — COMPLETE METABOLIC PANEL WITH GFR
AG Ratio: 1.7 (calc) (ref 1.0–2.5)
ALT: 37 U/L — ABNORMAL HIGH (ref 6–29)
AST: 24 U/L (ref 10–35)
Albumin: 4.2 g/dL (ref 3.6–5.1)
Alkaline phosphatase (APISO): 48 U/L (ref 33–130)
BUN: 11 mg/dL (ref 7–25)
CO2: 32 mmol/L (ref 20–32)
Calcium: 9.6 mg/dL (ref 8.6–10.4)
Chloride: 97 mmol/L — ABNORMAL LOW (ref 98–110)
Creat: 0.64 mg/dL (ref 0.60–0.88)
GFR, Est African American: 96 mL/min/{1.73_m2} (ref >=60)
GFR, Est Non African American: 83 mL/min/{1.73_m2} (ref >=60)
Globulin: 2.5 g/dL (calc) (ref 1.9–3.7)
Glucose, Bld: 115 mg/dL — ABNORMAL HIGH (ref 65–99)
Potassium: 4.6 mmol/L (ref 3.5–5.3)
Sodium: 137 mmol/L (ref 135–146)
Total Bilirubin: 0.7 mg/dL (ref 0.2–1.2)
Total Protein: 6.7 g/dL (ref 6.1–8.1)

## 2017-10-16 LAB — T4, FREE: Free T4: 1 ng/dL (ref 0.8–1.8)

## 2017-10-16 LAB — TSH: TSH: 0.31 mIU/L — ABNORMAL LOW (ref 0.40–4.50)

## 2017-10-16 LAB — VITAMIN D 25 HYDROXY (VIT D DEFICIENCY, FRACTURES): Vit D, 25-Hydroxy: 30 ng/mL (ref 30–100)

## 2017-10-16 LAB — T3, FREE: T3, Free: 3.2 pg/mL (ref 2.3–4.2)

## 2017-10-28 DIAGNOSIS — B351 Tinea unguium: Secondary | ICD-10-CM | POA: Diagnosis not present

## 2017-10-28 DIAGNOSIS — E1142 Type 2 diabetes mellitus with diabetic polyneuropathy: Secondary | ICD-10-CM | POA: Diagnosis not present

## 2017-10-28 DIAGNOSIS — L851 Acquired keratosis [keratoderma] palmaris et plantaris: Secondary | ICD-10-CM | POA: Diagnosis not present

## 2017-10-29 ENCOUNTER — Encounter: Payer: Self-pay | Admitting: Family Medicine

## 2017-10-29 ENCOUNTER — Ambulatory Visit (INDEPENDENT_AMBULATORY_CARE_PROVIDER_SITE_OTHER): Payer: Medicare Other | Admitting: Family Medicine

## 2017-10-29 VITALS — BP 124/74 | HR 92 | Resp 16 | Ht 63.0 in | Wt 185.0 lb

## 2017-10-29 DIAGNOSIS — Z9013 Acquired absence of bilateral breasts and nipples: Secondary | ICD-10-CM

## 2017-10-29 DIAGNOSIS — E1140 Type 2 diabetes mellitus with diabetic neuropathy, unspecified: Secondary | ICD-10-CM

## 2017-10-29 DIAGNOSIS — E669 Obesity, unspecified: Secondary | ICD-10-CM | POA: Diagnosis not present

## 2017-10-29 DIAGNOSIS — L03113 Cellulitis of right upper limb: Secondary | ICD-10-CM

## 2017-10-29 MED ORDER — PREDNISONE 5 MG PO TABS: 5.0000 mg | ORAL_TABLET | Freq: Two times a day (BID) | ORAL | 0 refills | Status: AC

## 2017-10-29 MED ORDER — HYDROXYZINE HCL 10 MG PO TABS: ORAL_TABLET | ORAL | 0 refills | Status: DC

## 2017-10-29 MED ORDER — UNABLE TO FIND: 0 refills | Status: DC

## 2017-10-29 MED ORDER — CEPHALEXIN 500 MG PO CAPS: ORAL_CAPSULE | ORAL | 0 refills | Status: DC

## 2017-10-29 NOTE — Progress Notes (Signed)
Lisa Mann     MRN: [id number]      DOB: 07/05/1933   HPI [name] is here for follow up and re-evaluation of chronic medical conditions, medication management and review of any available recent lab and radiology data.  1 week h/o itchy red rashes x 2 , one on right hand , dorsum , the 2nd x4 days on right flank area. Red , warm burning , f skin, diameter approx 4 in in flank and the one and the hand is increasing as she has been rubbing and scratching the area, no fevr, chills or purulent drainage  Needs bras for mastectomy , gets 6 / yeaar, she is double mastectomy   ROS Denies recent fever or chills. Denies sinus pressure, nasal congestion, ear pain or sore throat. Denies chest congestion, productive cough or wheezing. Denies chest pains, palpitations and leg swelling Denies abdominal pain, nausea, vomiting,diarrhea or constipation.   Denies dysuria, frequency, hesitancy or incontinence. Denies uncontrolled  joint pain, swelling and limitation in mobility. Denies headaches, seizures, numbness, or tingling. Denies depression, anxiety or insomnia.    PE  BP 124/74   Pulse (!) 101   Resp 16   Ht 5\' 3"  (1.6 m)   Wt 185 lb (83.9 kg)   SpO2 97%   BMI 32.77 kg/m   Patient alert and oriented and in no cardiopulmonary distress.  HEENT: No facial asymmetry, EOMI,   oropharynx pink and moist.  Neck supple no JVD, no mass.  Chest: Clear to auscultation bilaterally.  CVS: S1, S2 no murmurs, no S3.Regular rate.  ABD: Soft non tender.   Ext: No edema  Decreased e ROM spine, shoulders, hips and knees.  Skin:two erythematous macular rashes on right hand dorsum and right flank, no drainage Psych: Good eye contact, normal affect. Memory intact not anxious or depressed appearing.  CNS: CN 2-12 intact, power,  normal throughout.no focal deficits noted.   Assessment & Plan  Diabetes mellitus with neuropathy Controlled, no change in medication [name] is reminded of the  importance of commitment to daily physical activity for 30 minutes or more, as able and the need to limit carbohydrate intake to 30 to 60 grams per meal to help with blood sugar control.   The need to take medication as prescribed, test blood sugar as directed, and to call between visits if there is a concern that blood sugar is uncontrolled is also discussed.   [name] is reminded of the importance of daily foot exam, annual eye examination, and good blood sugar, blood pressure and cholesterol control.  Diabetic Labs Latest Ref Rng & Units 10/14/2017 10/13/2017 05/23/2017 04/03/2017 10/08/2016  HbA1c <5.7 % of total Hgb 7.0(H) - - 6.6(H) -  Microalbumin mg/dL - 0.3 - - 0.3  Micro/Creat Ratio <30 mcg/mg creat - - - - 6  Chol <200 mg/dL 156 - - 157 -  HDL >50 mg/dL 53 - - 53 -  Calc LDL mg/dL (calc) 84 - - 87 -  Triglycerides <150 mg/dL 92 - - 77 -  Creatinine 0.60 - 0.88 mg/dL 0.64 - 0.61 0.65 -   BP/Weight 10/29/2017 10/13/2017 08/11/2017 05/23/2017 04/01/2017 02/27/2017 6/78/9381  Systolic BP 017 510 258 527 782 423 536  Diastolic BP 74 82 72 67 78 71 81  Wt. (Lbs) 185 190 187.8 184 185 184 188  BMI 32.77 33.66 33.27 32.59 32.77 32.59 33.3   Foot/eye exam completion dates Latest Ref Rng & Units 10/29/2017 01/13/2017  Eye Exam  No Retinopathy - -  Foot exam Order - - -  Foot Form Completion - Done Done        History of bilateral mastectomy Needs for annual supply of mastectomy Bras, she has a double mastectomy  Cellulitis Keflex, prednisone and hydroxyzine are prescribed, she is to call if no better in 5 days  Obesity (BMI 30.0-34.9) improved Patient re-educated about  the importance of commitment to a  minimum of 150 minutes of exercise per week.  The importance of healthy food choices with portion control discussed. Encouraged to start a food diary, count calories and to consider  joining a support group. Sample diet sheets offered. Goals set by the patient for the next several  months.   Weight /BMI 10/29/2017 10/13/2017 08/11/2017  WEIGHT 185 lb 190 lb 187 lb 12.8 oz  HEIGHT 5\' 3"  5\' 3"  5\' 3"   BMI 32.77 kg/m2 33.66 kg/m2 33.27 kg/m2

## 2017-10-29 NOTE — Assessment & Plan Note (Signed)
Controlled, no change in medication Lisa Mann is reminded of the importance of commitment to daily physical activity for 30 minutes or more, as able and the need to limit carbohydrate intake to 30 to 60 grams per meal to help with blood sugar control.   The need to take medication as prescribed, test blood sugar as directed, and to call between visits if there is a concern that blood sugar is uncontrolled is also discussed.   Lisa Mann is reminded of the importance of daily foot exam, annual eye examination, and good blood sugar, blood pressure and cholesterol control.  Diabetic Labs Latest Ref Rng & Units 10/14/2017 10/13/2017 05/23/2017 04/03/2017 10/08/2016  HbA1c <5.7 % of total Hgb 7.0(H) - - 6.6(H) -  Microalbumin mg/dL - 0.3 - - 0.3  Micro/Creat Ratio <30 mcg/mg creat - - - - 6  Chol <200 mg/dL 156 - - 157 -  HDL >50 mg/dL 53 - - 53 -  Calc LDL mg/dL (calc) 84 - - 87 -  Triglycerides <150 mg/dL 92 - - 77 -  Creatinine 0.60 - 0.88 mg/dL 0.64 - 0.61 0.65 -   BP/Weight 10/29/2017 10/13/2017 08/11/2017 05/23/2017 04/01/2017 02/27/2017 0/62/3762  Systolic BP 831 517 616 073 710 626 948  Diastolic BP 74 82 72 67 78 71 81  Wt. (Lbs) 185 190 187.8 184 185 184 188  BMI 32.77 33.66 33.27 32.59 32.77 32.59 33.3   Foot/eye exam completion dates Latest Ref Rng & Units 10/29/2017 01/13/2017  Eye Exam No Retinopathy - -  Foot exam Order - - -  Foot Form Completion - Done Done

## 2017-10-29 NOTE — Assessment & Plan Note (Signed)
Keflex, prednisone and hydroxyzine are prescribed, she is to call if no better in 5 days

## 2017-10-29 NOTE — Patient Instructions (Signed)
F/u as before, call if you need me sooner  Three medications are c sent for cellullitis and itchy rash. Call if worsening or not much better in next 5 days please  You may get diabetic shoes based on today's exam  Need fr mastectomy bra is clear so may colect

## 2017-10-29 NOTE — Assessment & Plan Note (Signed)
improved Patient re-educated about  the importance of commitment to a  minimum of 150 minutes of exercise per week.  The importance of healthy food choices with portion control discussed. Encouraged to start a food diary, count calories and to consider  joining a support group. Sample diet sheets offered. Goals set by the patient for the next several months.   Weight /BMI 10/29/2017 10/13/2017 08/11/2017  WEIGHT 185 lb 190 lb 187 lb 12.8 oz  HEIGHT 5\' 3"  5\' 3"  5\' 3"   BMI 32.77 kg/m2 33.66 kg/m2 33.27 kg/m2

## 2017-10-29 NOTE — Assessment & Plan Note (Signed)
Needs for annual supply of mastectomy Lisa Mann, she has a double mastectomy

## 2017-10-30 DIAGNOSIS — Z1211 Encounter for screening for malignant neoplasm of colon: Secondary | ICD-10-CM

## 2017-10-30 LAB — HEMOCCULT GUIAC POC 1CARD (OFFICE)
Card #2 Fecal Occult Blod, POC: NEGATIVE
Card #3 Fecal Occult Blood, POC: NEGATIVE
Fecal Occult Blood, POC: NEGATIVE

## 2017-11-03 ENCOUNTER — Other Ambulatory Visit: Payer: Self-pay | Admitting: Family Medicine

## 2017-11-18 DIAGNOSIS — H1851 Endothelial corneal dystrophy: Secondary | ICD-10-CM | POA: Diagnosis not present

## 2017-11-18 DIAGNOSIS — E119 Type 2 diabetes mellitus without complications: Secondary | ICD-10-CM | POA: Diagnosis not present

## 2017-11-18 DIAGNOSIS — H04123 Dry eye syndrome of bilateral lacrimal glands: Secondary | ICD-10-CM | POA: Diagnosis not present

## 2017-11-18 DIAGNOSIS — H2513 Age-related nuclear cataract, bilateral: Secondary | ICD-10-CM | POA: Diagnosis not present

## 2017-11-18 LAB — HM DIABETES EYE EXAM

## 2017-11-20 ENCOUNTER — Other Ambulatory Visit: Payer: Self-pay | Admitting: Family Medicine

## 2017-11-20 DIAGNOSIS — Z23 Encounter for immunization: Secondary | ICD-10-CM

## 2017-11-20 DIAGNOSIS — E669 Obesity, unspecified: Secondary | ICD-10-CM

## 2017-11-20 DIAGNOSIS — E785 Hyperlipidemia, unspecified: Secondary | ICD-10-CM

## 2017-11-20 DIAGNOSIS — I10 Essential (primary) hypertension: Secondary | ICD-10-CM

## 2017-11-20 DIAGNOSIS — E1169 Type 2 diabetes mellitus with other specified complication: Secondary | ICD-10-CM

## 2017-11-21 ENCOUNTER — Other Ambulatory Visit: Payer: Self-pay

## 2017-11-21 DIAGNOSIS — Z9013 Acquired absence of bilateral breasts and nipples: Secondary | ICD-10-CM

## 2017-11-21 MED ORDER — UNABLE TO FIND: 0 refills | Status: DC

## 2018-02-03 ENCOUNTER — Encounter: Payer: Self-pay | Admitting: Family Medicine

## 2018-02-03 ENCOUNTER — Ambulatory Visit (INDEPENDENT_AMBULATORY_CARE_PROVIDER_SITE_OTHER): Payer: Medicare Other | Admitting: Family Medicine

## 2018-02-03 VITALS — BP 128/60 | HR 96 | Temp 98.4°F | Resp 14 | Ht 63.0 in | Wt 183.1 lb

## 2018-02-03 DIAGNOSIS — J3089 Other allergic rhinitis: Secondary | ICD-10-CM

## 2018-02-03 DIAGNOSIS — I10 Essential (primary) hypertension: Secondary | ICD-10-CM

## 2018-02-03 DIAGNOSIS — E669 Obesity, unspecified: Secondary | ICD-10-CM | POA: Diagnosis not present

## 2018-02-03 DIAGNOSIS — E1140 Type 2 diabetes mellitus with diabetic neuropathy, unspecified: Secondary | ICD-10-CM

## 2018-02-03 DIAGNOSIS — E1159 Type 2 diabetes mellitus with other circulatory complications: Secondary | ICD-10-CM

## 2018-02-03 MED ORDER — UNABLE TO FIND: 0 refills | Status: DC

## 2018-02-03 MED ORDER — MONTELUKAST SODIUM 10 MG PO TABS: 10.0000 mg | ORAL_TABLET | Freq: Every day | ORAL | 3 refills | Status: DC

## 2018-02-03 MED ORDER — CHLORPHENIRAMINE MALEATE 4 MG PO TABS: 4.0000 mg | ORAL_TABLET | Freq: Two times a day (BID) | ORAL | 0 refills | Status: DC | PRN

## 2018-02-03 MED ORDER — MECLIZINE HCL 12.5 MG PO TABS: 12.5000 mg | ORAL_TABLET | Freq: Two times a day (BID) | ORAL | 0 refills | Status: DC | PRN

## 2018-02-03 MED ORDER — PREDNISONE 5 MG PO TABS: 5.0000 mg | ORAL_TABLET | Freq: Two times a day (BID) | ORAL | 0 refills | Status: AC

## 2018-02-03 NOTE — Patient Instructions (Addendum)
F/U as before , call if you need me sooner  You are treated for  Uncontrolled allergy symptoms  Causing drainage and cough    Three medications are prescribed for allergies, also meclizine for dizziness  You will receive lab order for next visit from nurse today  You are referred for diabetic foot care

## 2018-02-04 ENCOUNTER — Encounter: Payer: Self-pay | Admitting: Family Medicine

## 2018-02-04 NOTE — Assessment & Plan Note (Signed)
Uncontrolled , treated with chlorpheniramine, prednisone , short term and singulair long term

## 2018-02-04 NOTE — Assessment & Plan Note (Signed)
Controlled, no change in medication DASH diet and commitment to daily physical activity for a minimum of 30 minutes discussed and encouraged, as a part of hypertension management. The importance of attaining a healthy weight is also discussed.  BP/Weight 02/03/2018 10/29/2017 10/13/2017 08/11/2017 05/23/2017 04/01/2017 6/72/5500  Systolic BP 164 290 379 558 316 742 552  Diastolic BP 60 74 82 72 67 78 71  Wt. (Lbs) 183.12 185 190 187.8 184 185 184  BMI 32.44 32.77 33.66 33.27 32.59 32.77 32.59

## 2018-02-04 NOTE — Progress Notes (Signed)
AVIANNA Mann     MRN: [id number]      DOB: [birth year]   HPI [name] is here with a 1 week h/o head and chest congestion , and intermittent chills, improving somewhat, but uncontrolled coughing at bedtime Needs diabetic shoes ROS . Denies chest pains, palpitations and leg swelling Denies abdominal pain, nausea, vomiting,diarrhea or constipation.   Denies dysuria, frequency, hesitancy or incontinence. C/o chronic  joint pain, swelling and limitation in mobility. Denies headaches, seizures, numbness, or tingling. Denies depression, anxiety or insomnia. Denies skin break down or rash.   PE  BP 128/60   Pulse 96   Temp 98.4 F (36.9 C) (Oral)   Resp 14   Ht 5\' 3"  (1.6 m)   Wt 183 lb 1.9 oz (83.1 kg)   SpO2 96% Comment: room air  BMI 32.44 kg/m   Patient alert and oriented and in no cardiopulmonary distress.  HEENT: No facial asymmetry, EOMI,   oropharynx pink and moist.  Neck supple no JVD, no mass.Mild frontal sinus tenderness, Mild erythema and edema of nasal mucosa, no cervical adenitis  Chest: Clear to auscultation bilaterally.  CVS: S1, S2 no murmurs, no S3.Regular rate.  ABD: Soft non tender.   Ext: No edema  MS: Adequate ROM spine, shoulders, hips and knees.  Skin: Intact, no ulcerations or rash noted.  Psych: Good eye contact, normal affect. Memory intact not anxious or depressed appearing.  CNS: CN 2-12 intact, power,  normal throughout.no focal deficits noted.   Assessment & Plan  Allergic rhinitis Uncontrolled , treated with chlorpheniramine, prednisone , short term and singulair long term  Diabetes mellitus with neuropathy [name] is reminded of the importance of commitment to daily physical activity for 30 minutes or more, as able and the need to limit carbohydrate intake to 30 to 60 grams per meal to help with blood sugar control.   The need to take medication as prescribed, test blood sugar as directed, and to call between visits if there  is a concern that blood sugar is uncontrolled is also discussed.   [name] is reminded of the importance of daily foot exam, annual eye examination, and good blood sugar, blood pressure and cholesterol control.  Diabetic Labs Latest Ref Rng & Units 10/14/2017 10/13/2017 05/23/2017 04/03/2017 10/08/2016  HbA1c <5.7 % of total Hgb 7.0(H) - - 6.6(H) -  Microalbumin mg/dL - 0.3 - - 0.3  Micro/Creat Ratio <30 mcg/mg creat - - - - 6  Chol <200 mg/dL 156 - - 157 -  HDL >50 mg/dL 53 - - 53 -  Calc LDL mg/dL (calc) 84 - - 87 -  Triglycerides <150 mg/dL 92 - - 77 -  Creatinine 0.60 - 0.88 mg/dL 0.64 - 0.61 0.65 -   BP/Weight 02/03/2018 10/29/2017 10/13/2017 08/11/2017 05/23/2017 04/01/2017 9/79/8921  Systolic BP 194 174 081 448 185 631 497  Diastolic BP 60 74 82 72 67 78 71  Wt. (Lbs) 183.12 185 190 187.8 184 185 184  BMI 32.44 32.77 33.66 33.27 32.59 32.77 32.59   Foot/eye exam completion dates Latest Ref Rng & Units 11/18/2017 10/29/2017  Eye Exam No Retinopathy No Retinopathy -  Foot exam Order - - -  Foot Form Completion - - Done   Controlled, no change in medication      Obesity (BMI 30.0-34.9) Unchanged Patient re-educated about  the importance of commitment to a  minimum of 150 minutes of exercise per week.  The importance of healthy food choices  with portion control discussed. Encouraged to start a food diary, count calories and to consider  joining a support group. Sample diet sheets offered. Goals set by the patient for the next several months.   Weight /BMI 02/03/2018 10/29/2017 10/13/2017  WEIGHT 183 lb 1.9 oz 185 lb 190 lb  HEIGHT 5\' 3"  5\' 3"  5\' 3"   BMI 32.44 kg/m2 32.77 kg/m2 33.66 kg/m2      HTN, goal below 130/80 Controlled, no change in medication DASH diet and commitment to daily physical activity for a minimum of 30 minutes discussed and encouraged, as a part of hypertension management. The importance of attaining a healthy weight is also discussed.  BP/Weight  02/03/2018 10/29/2017 10/13/2017 08/11/2017 05/23/2017 04/01/2017 1/85/6314  Systolic BP 970 263 785 885 027 741 287  Diastolic BP 60 74 82 72 67 78 71  Wt. (Lbs) 183.12 185 190 187.8 184 185 184  BMI 32.44 32.77 33.66 33.27 32.59 32.77 32.59

## 2018-02-04 NOTE — Assessment & Plan Note (Addendum)
Unchanged Patient re-educated about  the importance of commitment to a  minimum of 150 minutes of exercise per week.  The importance of healthy food choices with portion control discussed. Encouraged to start a food diary, count calories and to consider  joining a support group. Sample diet sheets offered. Goals set by the patient for the next several months.   Weight /BMI 02/03/2018 10/29/2017 10/13/2017  WEIGHT 183 lb 1.9 oz 185 lb 190 lb  HEIGHT 5\' 3"  5\' 3"  5\' 3"   BMI 32.44 kg/m2 32.77 kg/m2 33.66 kg/m2

## 2018-02-04 NOTE — Assessment & Plan Note (Signed)
Lisa Mann is reminded of the importance of commitment to daily physical activity for 30 minutes or more, as able and the need to limit carbohydrate intake to 30 to 60 grams per meal to help with blood sugar control.   The need to take medication as prescribed, test blood sugar as directed, and to call between visits if there is a concern that blood sugar is uncontrolled is also discussed.   Lisa Mann is reminded of the importance of daily foot exam, annual eye examination, and good blood sugar, blood pressure and cholesterol control.  Diabetic Labs Latest Ref Rng & Units 10/14/2017 10/13/2017 05/23/2017 04/03/2017 10/08/2016  HbA1c <5.7 % of total Hgb 7.0(H) - - 6.6(H) -  Microalbumin mg/dL - 0.3 - - 0.3  Micro/Creat Ratio <30 mcg/mg creat - - - - 6  Chol <200 mg/dL 156 - - 157 -  HDL >50 mg/dL 53 - - 53 -  Calc LDL mg/dL (calc) 84 - - 87 -  Triglycerides <150 mg/dL 92 - - 77 -  Creatinine 0.60 - 0.88 mg/dL 0.64 - 0.61 0.65 -   BP/Weight 02/03/2018 10/29/2017 10/13/2017 08/11/2017 05/23/2017 04/01/2017 02/08/3341  Systolic BP 568 616 837 290 211 155 208  Diastolic BP 60 74 82 72 67 78 71  Wt. (Lbs) 183.12 185 190 187.8 184 185 184  BMI 32.44 32.77 33.66 33.27 32.59 32.77 32.59   Foot/eye exam completion dates Latest Ref Rng & Units 11/18/2017 10/29/2017  Eye Exam No Retinopathy No Retinopathy -  Foot exam Order - - -  Foot Form Completion - - Done   Controlled, no change in medication

## 2018-02-18 ENCOUNTER — Other Ambulatory Visit: Payer: Self-pay | Admitting: Family Medicine

## 2018-02-18 DIAGNOSIS — M2141 Flat foot [pes planus] (acquired), right foot: Secondary | ICD-10-CM | POA: Diagnosis not present

## 2018-02-18 DIAGNOSIS — L84 Corns and callosities: Secondary | ICD-10-CM | POA: Diagnosis not present

## 2018-02-18 DIAGNOSIS — E785 Hyperlipidemia, unspecified: Secondary | ICD-10-CM

## 2018-02-18 DIAGNOSIS — M2142 Flat foot [pes planus] (acquired), left foot: Secondary | ICD-10-CM | POA: Diagnosis not present

## 2018-02-18 DIAGNOSIS — Z23 Encounter for immunization: Secondary | ICD-10-CM

## 2018-02-18 DIAGNOSIS — E119 Type 2 diabetes mellitus without complications: Secondary | ICD-10-CM | POA: Diagnosis not present

## 2018-02-18 DIAGNOSIS — E669 Obesity, unspecified: Secondary | ICD-10-CM

## 2018-02-18 DIAGNOSIS — E1169 Type 2 diabetes mellitus with other specified complication: Secondary | ICD-10-CM

## 2018-02-18 DIAGNOSIS — I10 Essential (primary) hypertension: Secondary | ICD-10-CM

## 2018-02-23 DIAGNOSIS — I10 Essential (primary) hypertension: Secondary | ICD-10-CM | POA: Diagnosis not present

## 2018-02-23 DIAGNOSIS — E1140 Type 2 diabetes mellitus with diabetic neuropathy, unspecified: Secondary | ICD-10-CM | POA: Diagnosis not present

## 2018-02-24 LAB — COMPLETE METABOLIC PANEL WITH GFR
AG Ratio: 1.4 (calc) (ref 1.0–2.5)
ALT: 34 U/L — ABNORMAL HIGH (ref 6–29)
AST: 35 U/L (ref 10–35)
Albumin: 4.3 g/dL (ref 3.6–5.1)
Alkaline phosphatase (APISO): 47 U/L (ref 37–153)
BUN: 13 mg/dL (ref 7–25)
CO2: 30 mmol/L (ref 20–32)
Calcium: 10.5 mg/dL — ABNORMAL HIGH (ref 8.6–10.4)
Chloride: 100 mmol/L (ref 98–110)
Creat: 0.76 mg/dL (ref 0.60–0.88)
GFR, Est African American: 85 mL/min/{1.73_m2} (ref >=60)
GFR, Est Non African American: 73 mL/min/{1.73_m2} (ref >=60)
Globulin: 3 g/dL (calc) (ref 1.9–3.7)
Glucose, Bld: 93 mg/dL (ref 65–139)
Potassium: 5.3 mmol/L (ref 3.5–5.3)
Sodium: 139 mmol/L (ref 135–146)
Total Bilirubin: 0.3 mg/dL (ref 0.2–1.2)
Total Protein: 7.3 g/dL (ref 6.1–8.1)

## 2018-02-24 LAB — HEMOGLOBIN A1C
Hgb A1c MFr Bld: 6.8 % of total Hgb — ABNORMAL HIGH (ref <5.7)
Mean Plasma Glucose: 148 (calc)
eAG (mmol/L): 8.2 (calc)

## 2018-02-25 DIAGNOSIS — M79671 Pain in right foot: Secondary | ICD-10-CM | POA: Diagnosis not present

## 2018-02-25 DIAGNOSIS — M79672 Pain in left foot: Secondary | ICD-10-CM | POA: Diagnosis not present

## 2018-02-25 DIAGNOSIS — E1151 Type 2 diabetes mellitus with diabetic peripheral angiopathy without gangrene: Secondary | ICD-10-CM | POA: Diagnosis not present

## 2018-02-25 DIAGNOSIS — L110 Acquired keratosis follicularis: Secondary | ICD-10-CM | POA: Diagnosis not present

## 2018-02-25 DIAGNOSIS — E1140 Type 2 diabetes mellitus with diabetic neuropathy, unspecified: Secondary | ICD-10-CM | POA: Diagnosis not present

## 2018-03-02 ENCOUNTER — Encounter: Payer: Self-pay | Admitting: Family Medicine

## 2018-03-02 ENCOUNTER — Ambulatory Visit (INDEPENDENT_AMBULATORY_CARE_PROVIDER_SITE_OTHER): Payer: Medicare Other | Admitting: Family Medicine

## 2018-03-02 ENCOUNTER — Other Ambulatory Visit: Payer: Self-pay

## 2018-03-02 VITALS — BP 136/68 | HR 94 | Resp 12 | Ht 63.0 in | Wt 178.0 lb

## 2018-03-02 DIAGNOSIS — M109 Gout, unspecified: Secondary | ICD-10-CM

## 2018-03-02 DIAGNOSIS — I10 Essential (primary) hypertension: Secondary | ICD-10-CM

## 2018-03-02 DIAGNOSIS — E669 Obesity, unspecified: Secondary | ICD-10-CM

## 2018-03-02 DIAGNOSIS — E785 Hyperlipidemia, unspecified: Secondary | ICD-10-CM

## 2018-03-02 DIAGNOSIS — E1140 Type 2 diabetes mellitus with diabetic neuropathy, unspecified: Secondary | ICD-10-CM

## 2018-03-02 DIAGNOSIS — R002 Palpitations: Secondary | ICD-10-CM

## 2018-03-02 NOTE — Assessment & Plan Note (Addendum)
4 month h/o intermittent palpitations in pt with high cV risk refer to cardiology, and EKG today, this shows NSR, rate 86, no ischemia or LVH

## 2018-03-02 NOTE — Patient Instructions (Addendum)
Keep wellness in August with Nurse  F/u with mD early September , call if you nedd me sooner  Please check your pharmacy for Shingrix vaccine, I recommend this  EKG today because of palpitations, and you are referred toCardiology due to 4 month h/o intermittent palpitations  Labs and exam is otherwise good.  Please get fasting  Lipid, cmp and EGFR, hBA1C, CBC and uric acid level week of August 17  It is important that you exercise regularly at least 30 minutes 5 times a week. If you develop chest pain, have severe difficulty breathing, or feel very tired, stop exercising immediately and seek medical attention    Think about what you will eat, plan ahead. Choose " clean, green, fresh or frozen" over canned, processed or packaged foods which are more sugary, salty and fatty. 70 to 75% of food eaten should be vegetables and fruit. Three meals at set times with snacks allowed between meals, but they must be fruit or vegetables. Aim to eat over a 12 hour period , example 7 am to 7 pm, and STOP after  your last meal of the day. Drink water,generally about 64 ounces per day, no other drink is as healthy. Fruit juice is best enjoyed in a healthy way, by EATING the fruit.   Thanks for choosing [facility], we consider it a privelige to serve you.

## 2018-03-02 NOTE — Progress Notes (Signed)
Lisa Mann     MRN: [id number]      DOB: 1933/08/02   HPI Ms. Kalka is here for follow up and re-evaluation of chronic medical conditions, medication management and review of any available recent lab and radiology data.  Preventive health is updated, specifically  Cancer screening and Immunization.   Questions or concerns regarding consultations or procedures which the PT has had in the interim are  addressed. The PT denies any adverse reactions to current medications since the last visit.  4 month h/o intermittent palpitations, duratuion of between 3 to  5 minutes, often a minor trigger starts this, eg tele ringing last night Denies polyuria, polydipsia, blurred vision , or hypoglycemic episodes.   ROS Denies recent fever or chills. Denies sinus pressure, nasal congestion, ear pain or sore throat. Denies chest congestion, productive cough or wheezing. Denies chest pains, and leg swelling Denies abdominal pain, nausea, vomiting,diarrhea or constipation.   Denies dysuria, frequency, hesitancy or incontinence. Denies joint pain, swelling and limitation in mobility. Denies headaches, seizures, numbness, or tingling. Denies depression, anxiety or insomnia. Denies skin break down or rash.   PE  BP 136/68   Pulse 94   Resp 12   Ht 5\' 3"  (1.6 m)   Wt 178 lb (80.7 kg)   SpO2 97% Comment: room air  BMI 31.53 kg/m    Patient alert and oriented and in no cardiopulmonary distress.  HEENT: No facial asymmetry, EOMI,   oropharynx pink and moist.  Neck supple no JVD, no mass.  Chest: Clear to auscultation bilaterally.  CVS: S1, S2 no murmurs, no S3.Regular rate.  ABD: Soft non tender.   Ext: No edema  MS: Adequate ROM spine, shoulders, hips and knees.  Skin: Intact, no ulcerations or rash noted.  Psych: Good eye contact, normal affect. Memory intact not anxious or depressed appearing.  CNS: CN 2-12 intact, power,  normal throughout.no focal deficits  noted.   Assessment & Plan  Palpitations 4 month h/o intermittent palpitations in pt with high cV risk refer to cardiology, and EKG today, this shows NSR, rate 86, no ischemia or LVH  Obesity (BMI 30.0-34.9) Improved, she is applauded on this.  Patient re-educated about  the importance of commitment to a  minimum of 150 minutes of exercise per week as able.  The importance of healthy food choices with portion control discussed, as well as eating regularly and within a 12 hour window most days. The need to choose "clean , green" food 50 to 75% of the time is discussed, as well as to make water the primary drink and set a goal of 64 ounces water daily.  Encouraged to start a food diary,  and to consider  joining a support group. Sample diet sheets offered. Goals set by the patient for the next several months.   Weight /BMI 03/02/2018 02/03/2018 10/29/2017  WEIGHT 178 lb 183 lb 1.9 oz 185 lb  HEIGHT 5\' 3"  5\' 3"  5\' 3"   BMI 31.53 kg/m2 32.44 kg/m2 32.77 kg/m2    \  Diabetes mellitus with neuropathy Controlled, no change in medication Ms. Karbowski is reminded of the importance of commitment to daily physical activity for 30 minutes or more, as able and the need to limit carbohydrate intake to 30 to 60 grams per meal to help with blood sugar control.   The need to take medication as prescribed, test blood sugar as directed, and to call between visits if there is a concern that blood sugar  is uncontrolled is also discussed.   Ms. Falcon is reminded of the importance of daily foot exam, annual eye examination, and good blood sugar, blood pressure and cholesterol control.  Diabetic Labs Latest Ref Rng & Units 02/23/2018 10/14/2017 10/13/2017 05/23/2017 04/03/2017  HbA1c <5.7 % of total Hgb 6.8(H) 7.0(H) - - 6.6(H)  Microalbumin mg/dL - - 0.3 - -  Micro/Creat Ratio <30 mcg/mg creat - - - - -  Chol <200 mg/dL - 156 - - 157  HDL >50 mg/dL - 53 - - 53  Calc LDL mg/dL (calc) - 84 - - 87   Triglycerides <150 mg/dL - 92 - - 77  Creatinine 0.60 - 0.88 mg/dL 0.76 0.64 - 0.61 0.65   BP/Weight 03/02/2018 02/03/2018 10/29/2017 10/13/2017 08/11/2017 05/23/2017 9/89/2119  Systolic BP 417 408 144 818 563 149 702  Diastolic BP 68 60 74 82 72 67 78  Wt. (Lbs) 178 183.12 185 190 187.8 184 185  BMI 31.53 32.44 32.77 33.66 33.27 32.59 32.77   Foot/eye exam completion dates Latest Ref Rng & Units 03/02/2018 11/18/2017  Eye Exam No Retinopathy - No Retinopathy  Foot exam Order - - -  Foot Form Completion - Done -        HTN, goal below 130/80 Controlled, no change in medication DASH diet and commitment to daily physical activity for a minimum of 30 minutes discussed and encouraged, as a part of hypertension management. The importance of attaining a healthy weight is also discussed.  BP/Weight 03/02/2018 02/03/2018 10/29/2017 10/13/2017 08/11/2017 05/23/2017 6/37/8588  Systolic BP 502 774 128 786 767 209 470  Diastolic BP 68 60 74 82 72 67 78  Wt. (Lbs) 178 183.12 185 190 187.8 184 185  BMI 31.53 32.44 32.77 33.66 33.27 32.59 32.77       Hyperlipidemia with target LDL less than 100 . Hyperlipidemia:Low fat diet discussed and encouraged.   Lipid Panel  Lab Results  Component Value Date   CHOL 156 10/14/2017   HDL 53 10/14/2017   LDLCALC 84 10/14/2017   TRIG 92 10/14/2017   CHOLHDL 2.9 10/14/2017   Controlled, no change in medication

## 2018-03-04 NOTE — Assessment & Plan Note (Signed)
Controlled, no change in medication DASH diet and commitment to daily physical activity for a minimum of 30 minutes discussed and encouraged, as a part of hypertension management. The importance of attaining a healthy weight is also discussed.  BP/Weight 03/02/2018 02/03/2018 10/29/2017 10/13/2017 08/11/2017 05/23/2017 1/58/6825  Systolic BP 749 355 217 471 595 396 728  Diastolic BP 68 60 74 82 72 67 78  Wt. (Lbs) 178 183.12 185 190 187.8 184 185  BMI 31.53 32.44 32.77 33.66 33.27 32.59 32.77

## 2018-03-04 NOTE — Assessment & Plan Note (Signed)
.   Hyperlipidemia:Low fat diet discussed and encouraged.   Lipid Panel  Lab Results  Component Value Date   CHOL 156 10/14/2017   HDL 53 10/14/2017   LDLCALC 84 10/14/2017   TRIG 92 10/14/2017   CHOLHDL 2.9 10/14/2017   Controlled, no change in medication

## 2018-03-04 NOTE — Assessment & Plan Note (Signed)
Controlled, no change in medication Lisa Mann is reminded of the importance of commitment to daily physical activity for 30 minutes or more, as able and the need to limit carbohydrate intake to 30 to 60 grams per meal to help with blood sugar control.   The need to take medication as prescribed, test blood sugar as directed, and to call between visits if there is a concern that blood sugar is uncontrolled is also discussed.   Lisa Mann is reminded of the importance of daily foot exam, annual eye examination, and good blood sugar, blood pressure and cholesterol control.  Diabetic Labs Latest Ref Rng & Units 02/23/2018 10/14/2017 10/13/2017 05/23/2017 04/03/2017  HbA1c <5.7 % of total Hgb 6.8(H) 7.0(H) - - 6.6(H)  Microalbumin mg/dL - - 0.3 - -  Micro/Creat Ratio <30 mcg/mg creat - - - - -  Chol <200 mg/dL - 156 - - 157  HDL >50 mg/dL - 53 - - 53  Calc LDL mg/dL (calc) - 84 - - 87  Triglycerides <150 mg/dL - 92 - - 77  Creatinine 0.60 - 0.88 mg/dL 0.76 0.64 - 0.61 0.65   BP/Weight 03/02/2018 02/03/2018 10/29/2017 10/13/2017 08/11/2017 05/23/2017 1/60/1093  Systolic BP 235 573 220 254 270 623 762  Diastolic BP 68 60 74 82 72 67 78  Wt. (Lbs) 178 183.12 185 190 187.8 184 185  BMI 31.53 32.44 32.77 33.66 33.27 32.59 32.77   Foot/eye exam completion dates Latest Ref Rng & Units 03/02/2018 11/18/2017  Eye Exam No Retinopathy - No Retinopathy  Foot exam Order - - -  Foot Form Completion - Done -

## 2018-03-04 NOTE — Assessment & Plan Note (Signed)
Improved, she is applauded on this.  Patient re-educated about  the importance of commitment to a  minimum of 150 minutes of exercise per week as able.  The importance of healthy food choices with portion control discussed, as well as eating regularly and within a 12 hour window most days. The need to choose "clean , green" food 50 to 75% of the time is discussed, as well as to make water the primary drink and set a goal of 64 ounces water daily.  Encouraged to start a food diary,  and to consider  joining a support group. Sample diet sheets offered. Goals set by the patient for the next several months.   Weight /BMI 03/02/2018 02/03/2018 10/29/2017  WEIGHT 178 lb 183 lb 1.9 oz 185 lb  HEIGHT 5\' 3"  5\' 3"  5\' 3"   BMI 31.53 kg/m2 32.44 kg/m2 32.77 kg/m2    \

## 2018-03-09 ENCOUNTER — Encounter: Payer: Self-pay | Admitting: Cardiology

## 2018-04-07 ENCOUNTER — Telehealth: Payer: Self-pay

## 2018-04-07 ENCOUNTER — Other Ambulatory Visit: Payer: Self-pay | Admitting: Family Medicine

## 2018-04-07 MED ORDER — PREDNISONE 5 MG PO TABS: 5.0000 mg | ORAL_TABLET | Freq: Two times a day (BID) | ORAL | 0 refills | Status: AC

## 2018-04-07 NOTE — Telephone Encounter (Signed)
States she has had a problem with itching all over throughout the day. Usually on her arms and sides and back and worse at night. No visible rash unless she scratches then it pops up little tiny bumps that stay for awhile then go away. Hasn't done anything different but had this problem before after taking pneumovax and you gave her prednisone and it helped a lot. Came back again after the shingrix shot. Has tried benadryl pills and cream but it only helps for a short time. Please advise

## 2018-04-07 NOTE — Telephone Encounter (Signed)
I am prescribing a course of prednisone, please let her know, and take the benadryl tablet, one,  at night for next 5 to 7 days

## 2018-04-07 NOTE — Telephone Encounter (Signed)
Patient aware.

## 2018-04-20 ENCOUNTER — Telehealth: Payer: Self-pay | Admitting: Family Medicine

## 2018-04-20 NOTE — Telephone Encounter (Signed)
[facility] called and needs form for diabetic supplies filled out and faxed back.

## 2018-04-21 ENCOUNTER — Other Ambulatory Visit: Payer: Self-pay | Admitting: Family Medicine

## 2018-04-21 DIAGNOSIS — Z23 Encounter for immunization: Secondary | ICD-10-CM

## 2018-04-21 DIAGNOSIS — I10 Essential (primary) hypertension: Secondary | ICD-10-CM

## 2018-04-21 DIAGNOSIS — E1169 Type 2 diabetes mellitus with other specified complication: Secondary | ICD-10-CM

## 2018-04-21 DIAGNOSIS — E785 Hyperlipidemia, unspecified: Secondary | ICD-10-CM

## 2018-04-21 DIAGNOSIS — E669 Obesity, unspecified: Secondary | ICD-10-CM

## 2018-04-21 NOTE — Telephone Encounter (Signed)
Pt has to call and request

## 2018-05-15 ENCOUNTER — Encounter: Payer: Self-pay | Admitting: *Deleted

## 2018-05-18 DIAGNOSIS — L110 Acquired keratosis follicularis: Secondary | ICD-10-CM | POA: Diagnosis not present

## 2018-05-18 DIAGNOSIS — I739 Peripheral vascular disease, unspecified: Secondary | ICD-10-CM | POA: Diagnosis not present

## 2018-05-18 DIAGNOSIS — E1140 Type 2 diabetes mellitus with diabetic neuropathy, unspecified: Secondary | ICD-10-CM | POA: Diagnosis not present

## 2018-05-18 DIAGNOSIS — M79671 Pain in right foot: Secondary | ICD-10-CM | POA: Diagnosis not present

## 2018-05-18 DIAGNOSIS — M79672 Pain in left foot: Secondary | ICD-10-CM | POA: Diagnosis not present

## 2018-05-19 ENCOUNTER — Other Ambulatory Visit: Payer: Self-pay | Admitting: Family Medicine

## 2018-05-19 ENCOUNTER — Telehealth: Payer: Self-pay | Admitting: *Deleted

## 2018-05-19 NOTE — Telephone Encounter (Signed)
pls needs once daily testing only , pls send supplies

## 2018-05-19 NOTE — Telephone Encounter (Signed)
Pt called requesting her diabetic supplies be sent to [facility]. The number she gave was [phone]. Said Dr. Moshe Cipro would need to send in a new prescription.

## 2018-05-19 NOTE — Telephone Encounter (Signed)
Received fax. Placed in providers box for signature.

## 2018-05-19 NOTE — Telephone Encounter (Signed)
Called number provided. They have to send a fax request. Will wait on fax request to complete request.

## 2018-05-21 ENCOUNTER — Telehealth: Payer: Self-pay

## 2018-05-21 MED ORDER — BLOOD GLUCOSE METER KIT: PACK | 0 refills | Status: DC

## 2018-05-21 NOTE — Telephone Encounter (Signed)
Order placed for glucometer 

## 2018-05-21 NOTE — Telephone Encounter (Signed)
New script for test supplies faxed to requested company

## 2018-05-21 NOTE — Telephone Encounter (Signed)
Pt is calling back regarding her supplies, she will run out next week.

## 2018-05-22 ENCOUNTER — Other Ambulatory Visit: Payer: Self-pay

## 2018-05-22 ENCOUNTER — Inpatient Hospital Stay (HOSPITAL_COMMUNITY): Payer: Medicare Other | Attending: Hematology

## 2018-05-22 DIAGNOSIS — C50911 Malignant neoplasm of unspecified site of right female breast: Secondary | ICD-10-CM

## 2018-05-22 DIAGNOSIS — Z9221 Personal history of antineoplastic chemotherapy: Secondary | ICD-10-CM | POA: Insufficient documentation

## 2018-05-22 DIAGNOSIS — Z171 Estrogen receptor negative status [ER-]: Secondary | ICD-10-CM | POA: Insufficient documentation

## 2018-05-22 DIAGNOSIS — Z853 Personal history of malignant neoplasm of breast: Secondary | ICD-10-CM | POA: Insufficient documentation

## 2018-05-22 DIAGNOSIS — Z9013 Acquired absence of bilateral breasts and nipples: Secondary | ICD-10-CM | POA: Insufficient documentation

## 2018-05-22 LAB — CBC WITH DIFFERENTIAL/PLATELET
Abs Immature Granulocytes: 0.03 10*3/uL (ref 0.00–0.07)
Basophils Absolute: 0 10*3/uL (ref 0.0–0.1)
Basophils Relative: 1 %
Eosinophils Absolute: 0.2 10*3/uL (ref 0.0–0.5)
Eosinophils Relative: 3 %
HCT: 39.5 % (ref 36.0–46.0)
Hemoglobin: 12.7 g/dL (ref 12.0–15.0)
Immature Granulocytes: 1 %
Lymphocytes Relative: 34 %
Lymphs Abs: 2 10*3/uL (ref 0.7–4.0)
MCH: 28.7 pg (ref 26.0–34.0)
MCHC: 32.2 g/dL (ref 30.0–36.0)
MCV: 89.4 fL (ref 80.0–100.0)
Monocytes Absolute: 0.7 10*3/uL (ref 0.1–1.0)
Monocytes Relative: 12 %
Neutro Abs: 2.8 10*3/uL (ref 1.7–7.7)
Neutrophils Relative %: 49 %
Platelets: 294 10*3/uL (ref 150–400)
RBC: 4.42 MIL/uL (ref 3.87–5.11)
RDW: 15 % (ref 11.5–15.5)
WBC: 5.7 10*3/uL (ref 4.0–10.5)
nRBC: 0 % (ref 0.0–0.2)

## 2018-05-22 LAB — COMPREHENSIVE METABOLIC PANEL
ALT: 31 U/L (ref 0–44)
AST: 29 U/L (ref 15–41)
Albumin: 4.2 g/dL (ref 3.5–5.0)
Alkaline Phosphatase: 40 U/L (ref 38–126)
Anion gap: 10 (ref 5–15)
BUN: 19 mg/dL (ref 8–23)
CO2: 29 mmol/L (ref 22–32)
Calcium: 10.4 mg/dL — ABNORMAL HIGH (ref 8.9–10.3)
Chloride: 101 mmol/L (ref 98–111)
Creatinine, Ser: 0.75 mg/dL (ref 0.44–1.00)
GFR calc Af Amer: >60 mL/min (ref >60)
GFR calc non Af Amer: >60 mL/min (ref >60)
Glucose, Bld: 114 mg/dL — ABNORMAL HIGH (ref 70–99)
Potassium: 4.5 mmol/L (ref 3.5–5.1)
Sodium: 140 mmol/L (ref 135–145)
Total Bilirubin: 0.4 mg/dL (ref 0.3–1.2)
Total Protein: 7.6 g/dL (ref 6.5–8.1)

## 2018-05-22 LAB — LACTATE DEHYDROGENASE: LDH: 162 U/L (ref 98–192)

## 2018-05-26 ENCOUNTER — Encounter (HOSPITAL_COMMUNITY): Payer: Self-pay | Admitting: Hematology

## 2018-05-26 ENCOUNTER — Other Ambulatory Visit: Payer: Self-pay

## 2018-05-26 ENCOUNTER — Inpatient Hospital Stay (HOSPITAL_BASED_OUTPATIENT_CLINIC_OR_DEPARTMENT_OTHER): Payer: Medicare Other | Admitting: Hematology

## 2018-05-26 DIAGNOSIS — Z853 Personal history of malignant neoplasm of breast: Secondary | ICD-10-CM

## 2018-05-26 DIAGNOSIS — Z9221 Personal history of antineoplastic chemotherapy: Secondary | ICD-10-CM

## 2018-05-26 DIAGNOSIS — Z171 Estrogen receptor negative status [ER-]: Secondary | ICD-10-CM

## 2018-05-26 DIAGNOSIS — Z9013 Acquired absence of bilateral breasts and nipples: Secondary | ICD-10-CM | POA: Diagnosis not present

## 2018-05-26 NOTE — Assessment & Plan Note (Addendum)
1.  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 - Status post adjuvant AC followed by T at [facility]. -We reviewed her blood work.  Physical exam today did not show any suspicious masses at the bilateral mastectomy sites. -We will see her back in 1 year for follow-up with repeat labs.  2.  Left breast DCIS: - Status post lumpectomy on 10/27/2006, ER/PR negative, margin less than 1 mm, status post  MammoSite.  3.  Mild hypercalcemia: - She had mildly elevated calcium of 10.53 months ago and 10.4 during the current labs.  Prior to that her calcium was normal. -I have reviewed her medications.  She is taking calcium and vitamin D.  She is also on hydrochlorothiazide.  Both of them can contribute to mild hypercalcemia.  Other differential is primary hyperparathyroidism. -I plan to check intact PTH level with next labs.
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
## 2018-05-26 NOTE — Patient Instructions (Addendum)
[facility] Discharge Instructions  You were seen today by Dr. Katragadda. He went over your recent lab results. He will see you back in 1 year for labs and follow up.   Thank you for choosing [facility] to provide your oncology and hematology care.  To afford each patient quality time with our provider, please arrive at least 15 minutes before your scheduled appointment time.   If you have a lab appointment with the Cancer Center please come in thru the  Main Entrance and check in at the main information desk  You need to re-schedule your appointment should you arrive 10 or more minutes late.  We strive to give you quality time with our providers, and arriving late affects you and other patients whose appointments are after yours.  Also, if you no show three or more times for appointments you may be dismissed from the clinic at the providers discretion.     Again, thank you for choosing [facility].  Our hope is that these requests will decrease the amount of time that you wait before being seen by our physicians.       _____________________________________________________________  Should you have questions after your visit to [facility], please contact our office at [phone] between the hours of 8:00 a.m. and 4:30 p.m.  Voicemails left after 4:00 p.m. will not be returned until the following business day.  For prescription refill requests, have your pharmacy contact our office and allow 72 hours.    Cancer Center Support Programs:   > Cancer Support Group  2nd Tuesday of the month 1pm-2pm, Journey Room    

## 2018-05-29 ENCOUNTER — Ambulatory Visit (HOSPITAL_COMMUNITY): Payer: Medicare Other | Admitting: Hematology

## 2018-06-13 ENCOUNTER — Other Ambulatory Visit: Payer: Self-pay | Admitting: Family Medicine

## 2018-06-15 ENCOUNTER — Other Ambulatory Visit: Payer: Self-pay | Admitting: Family Medicine

## 2018-06-24 ENCOUNTER — Other Ambulatory Visit (HOSPITAL_COMMUNITY): Payer: Self-pay | Admitting: Otolaryngology

## 2018-06-24 ENCOUNTER — Other Ambulatory Visit: Payer: Self-pay | Admitting: Otolaryngology

## 2018-06-24 DIAGNOSIS — E041 Nontoxic single thyroid nodule: Secondary | ICD-10-CM

## 2018-06-30 ENCOUNTER — Ambulatory Visit (HOSPITAL_COMMUNITY)
Admission: RE | Admit: 2018-06-30 | Discharge: 2018-06-30 | Disposition: A | Discharge status: Home / Self Care | Payer: Medicare Other | Source: Ambulatory Visit | Attending: Otolaryngology | Admitting: Otolaryngology

## 2018-06-30 ENCOUNTER — Other Ambulatory Visit: Payer: Self-pay

## 2018-06-30 DIAGNOSIS — E041 Nontoxic single thyroid nodule: Secondary | ICD-10-CM | POA: Diagnosis not present

## 2018-06-30 DIAGNOSIS — E042 Nontoxic multinodular goiter: Secondary | ICD-10-CM | POA: Diagnosis not present

## 2018-07-02 ENCOUNTER — Encounter: Payer: Self-pay | Admitting: Family Medicine

## 2018-07-02 ENCOUNTER — Telehealth: Payer: Self-pay

## 2018-07-02 MED ORDER — GLUCOSE BLOOD VI STRP: ORAL_STRIP | 2 refills | Status: DC

## 2018-07-02 NOTE — Telephone Encounter (Signed)
Testing strips ordered

## 2018-07-06 ENCOUNTER — Encounter: Payer: Self-pay | Admitting: Family Medicine

## 2018-07-06 ENCOUNTER — Telehealth: Payer: Self-pay

## 2018-07-06 DIAGNOSIS — E785 Hyperlipidemia, unspecified: Secondary | ICD-10-CM

## 2018-07-06 DIAGNOSIS — E1140 Type 2 diabetes mellitus with diabetic neuropathy, unspecified: Secondary | ICD-10-CM

## 2018-07-06 DIAGNOSIS — I10 Essential (primary) hypertension: Secondary | ICD-10-CM

## 2018-07-06 DIAGNOSIS — M109 Gout, unspecified: Secondary | ICD-10-CM

## 2018-07-06 NOTE — Telephone Encounter (Signed)
Expired labs re-entered per MD

## 2018-07-07 ENCOUNTER — Encounter: Payer: Self-pay | Admitting: Family Medicine

## 2018-07-07 ENCOUNTER — Other Ambulatory Visit: Payer: Self-pay

## 2018-07-07 DIAGNOSIS — E1140 Type 2 diabetes mellitus with diabetic neuropathy, unspecified: Secondary | ICD-10-CM | POA: Diagnosis not present

## 2018-07-07 DIAGNOSIS — M109 Gout, unspecified: Secondary | ICD-10-CM | POA: Diagnosis not present

## 2018-07-07 DIAGNOSIS — I10 Essential (primary) hypertension: Secondary | ICD-10-CM | POA: Diagnosis not present

## 2018-07-07 DIAGNOSIS — E785 Hyperlipidemia, unspecified: Secondary | ICD-10-CM | POA: Diagnosis not present

## 2018-07-07 MED ORDER — BLOOD GLUCOSE METER KIT: PACK | 0 refills | Status: DC

## 2018-07-08 ENCOUNTER — Encounter: Payer: Self-pay | Admitting: Family Medicine

## 2018-07-08 LAB — CBC
HCT: 37.1 % (ref 35.0–45.0)
Hemoglobin: 12.7 g/dL (ref 11.7–15.5)
MCH: 29.1 pg (ref 27.0–33.0)
MCHC: 34.2 g/dL (ref 32.0–36.0)
MCV: 84.9 fL (ref 80.0–100.0)
MPV: 9.5 fL (ref 7.5–12.5)
Platelets: 345 10*3/uL (ref 140–400)
RBC: 4.37 10*6/uL (ref 3.80–5.10)
RDW: 14.2 % (ref 11.0–15.0)
WBC: 5.3 10*3/uL (ref 3.8–10.8)

## 2018-07-08 LAB — COMPLETE METABOLIC PANEL WITH GFR
AG Ratio: 1.6 (calc) (ref 1.0–2.5)
ALT: 29 U/L (ref 6–29)
AST: 26 U/L (ref 10–35)
Albumin: 4.4 g/dL (ref 3.6–5.1)
Alkaline phosphatase (APISO): 41 U/L (ref 37–153)
BUN: 19 mg/dL (ref 7–25)
CO2: 29 mmol/L (ref 20–32)
Calcium: 10.2 mg/dL (ref 8.6–10.4)
Chloride: 98 mmol/L (ref 98–110)
Creat: 0.67 mg/dL (ref 0.60–0.88)
GFR, Est African American: 95 mL/min/{1.73_m2} (ref >=60)
GFR, Est Non African American: 82 mL/min/{1.73_m2} (ref >=60)
Globulin: 2.8 g/dL (calc) (ref 1.9–3.7)
Glucose, Bld: 126 mg/dL — ABNORMAL HIGH (ref 65–99)
Potassium: 4.3 mmol/L (ref 3.5–5.3)
Sodium: 137 mmol/L (ref 135–146)
Total Bilirubin: 0.3 mg/dL (ref 0.2–1.2)
Total Protein: 7.2 g/dL (ref 6.1–8.1)

## 2018-07-08 LAB — HEMOGLOBIN A1C
Hgb A1c MFr Bld: 6.9 % of total Hgb — ABNORMAL HIGH (ref <5.7)
Mean Plasma Glucose: 151 (calc)
eAG (mmol/L): 8.4 (calc)

## 2018-07-08 LAB — LIPID PANEL
Cholesterol: 170 mg/dL (ref <200)
HDL: 52 mg/dL (ref >=50)
LDL Cholesterol (Calc): 99 mg/dL (calc)
Non-HDL Cholesterol (Calc): 118 mg/dL (calc) (ref <130)
Total CHOL/HDL Ratio: 3.3 (calc) (ref <5.0)
Triglycerides: 97 mg/dL (ref <150)

## 2018-07-08 LAB — URIC ACID: Uric Acid, Serum: 3.4 mg/dL (ref 2.5–7.0)

## 2018-07-13 ENCOUNTER — Ambulatory Visit (INDEPENDENT_AMBULATORY_CARE_PROVIDER_SITE_OTHER): Payer: Medicare Other | Admitting: Otolaryngology

## 2018-07-13 DIAGNOSIS — H903 Sensorineural hearing loss, bilateral: Secondary | ICD-10-CM

## 2018-07-13 DIAGNOSIS — D440 Neoplasm of uncertain behavior of thyroid gland: Secondary | ICD-10-CM | POA: Diagnosis not present

## 2018-07-28 ENCOUNTER — Other Ambulatory Visit: Payer: PRIVATE HEALTH INSURANCE

## 2018-07-28 ENCOUNTER — Other Ambulatory Visit: Payer: Self-pay

## 2018-07-28 DIAGNOSIS — Z20822 Contact with and (suspected) exposure to covid-19: Secondary | ICD-10-CM

## 2018-07-28 NOTE — Progress Notes (Unsigned)
covid testing

## 2018-07-30 LAB — NOVEL CORONAVIRUS, NAA: SARS-CoV-2, NAA: DETECTED — AB

## 2018-08-13 ENCOUNTER — Other Ambulatory Visit: Payer: Self-pay

## 2018-08-13 ENCOUNTER — Ambulatory Visit (INDEPENDENT_AMBULATORY_CARE_PROVIDER_SITE_OTHER): Payer: Medicare Other | Admitting: Family Medicine

## 2018-08-13 ENCOUNTER — Encounter: Payer: Self-pay | Admitting: Family Medicine

## 2018-08-13 VITALS — BP 153/61 | HR 100 | Resp 14 | Ht 63.0 in | Wt 188.0 lb

## 2018-08-13 DIAGNOSIS — Z0000 Encounter for general adult medical examination without abnormal findings: Secondary | ICD-10-CM
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
## 2018-08-13 NOTE — Patient Instructions (Signed)
Lisa Mann , Thank you for taking time to come for your Medicare Wellness Visit. I appreciate your ongoing commitment to your health goals. Please review the following plan we discussed and let me know if I can assist you in the future.   Please continue to practice social distancing to keep you, your family, and our community safe.  If you must go out, please wear a Mask and practice good handwashing.  Screening recommendations/referrals: Colonoscopy: n/a Mammogram: n/a Bone Density: Completed Recommended yearly ophthalmology/optometry visit for glaucoma screening and checkup Recommended yearly dental visit for hygiene and checkup  Vaccinations: Influenza vaccine: Fall 2020 Pneumococcal vaccine: Completed Tdap vaccine: Due 2020 Shingles vaccine: Completed  Advanced directives: Has voiced to her children her wishes  Conditions/risks identified: Falls  Next appointment: 09/15/2018  Preventive Care 82 Years and Older, Female Preventive care refers to lifestyle choices and visits with your health care provider that can promote health and wellness. What does preventive care include?  A yearly physical exam. This is also called an annual well check.  Dental exams once or twice a year.  Routine eye exams. Ask your health care provider how often you should have your eyes checked.  Personal lifestyle choices, including:  Daily care of your teeth and gums.  Regular physical activity.  Eating a healthy diet.  Avoiding tobacco and drug use.  Limiting alcohol use.  Practicing safe sex.  Taking low-dose aspirin every day.  Taking vitamin and mineral supplements as recommended by your health care provider. What happens during an annual well check? The services and screenings done by your health care provider during your annual well check will depend on your age, overall health, lifestyle risk factors, and family history of disease. Counseling  Your health care provider may ask  you questions about your:  Alcohol use.  Tobacco use.  Drug use.  Emotional well-being.  Home and relationship well-being.  Sexual activity.  Eating habits.  History of falls.  Memory and ability to understand (cognition).  Work and work Statistician.  Reproductive health. Screening  You may have the following tests or measurements:  Height, weight, and BMI.  Blood pressure.  Lipid and cholesterol levels. These may be checked every 5 years, or more frequently if you are over 3 years old.  Skin check.  Lung cancer screening. You may have this screening every year starting at age 47 if you have a 30-pack-year history of smoking and currently smoke or have quit within the past 15 years.  Fecal occult blood test (FOBT) of the stool. You may have this test every year starting at age 36.  Flexible sigmoidoscopy or colonoscopy. You may have a sigmoidoscopy every 5 years or a colonoscopy every 10 years starting at age 17.  Hepatitis C blood test.  Hepatitis B blood test.  Sexually transmitted disease (STD) testing.  Diabetes screening. This is done by checking your blood sugar (glucose) after you have not eaten for a while (fasting). You may have this done every 1-3 years.  Bone density scan. This is done to screen for osteoporosis. You may have this done starting at age 37.  Mammogram. This may be done every 1-2 years. Talk to your health care provider about how often you should have regular mammograms. Talk with your health care provider about your test results, treatment options, and if necessary, the need for more tests. Vaccines  Your health care provider may recommend certain vaccines, such as:  Influenza vaccine. This is recommended every year.  Tetanus, diphtheria, and acellular pertussis (Tdap, Td) vaccine. You may need a Td booster every 10 years.  Zoster vaccine. You may need this after age 53.  Pneumococcal 13-valent conjugate (PCV13) vaccine. One dose  is recommended after age 28.  Pneumococcal polysaccharide (PPSV23) vaccine. One dose is recommended after age 72. Talk to your health care provider about which screenings and vaccines you need and how often you need them. This information is not intended to replace advice given to you by your health care provider. Make sure you discuss any questions you have with your health care provider. Document Released: 01/20/2015 Document Revised: 09/13/2015 Document Reviewed: 10/25/2014 Elsevier Interactive Patient Education  2017 [place] Prevention in the Home Falls can cause injuries. They can happen to people of all ages. There are many things you can do to make your home safe and to help prevent falls. What can I do on the outside of my home?  Regularly fix the edges of walkways and driveways and fix any cracks.  Remove anything that might make you trip as you walk through a door, such as a raised step or threshold.  Trim any bushes or trees on the path to your home.  Use bright outdoor lighting.  Clear any walking paths of anything that might make someone trip, such as rocks or tools.  Regularly check to see if handrails are loose or broken. Make sure that both sides of any steps have handrails.  Any raised decks and porches should have guardrails on the edges.  Have any leaves, snow, or ice cleared regularly.  Use sand or salt on walking paths during winter.  Clean up any spills in your garage right away. This includes oil or grease spills. What can I do in the bathroom?  Use night lights.  Install grab bars by the toilet and in the tub and shower. Do not use towel bars as grab bars.  Use non-skid mats or decals in the tub or shower.  If you need to sit down in the shower, use a plastic, non-slip stool.  Keep the floor dry. Clean up any water that spills on the floor as soon as it happens.  Remove soap buildup in the tub or shower regularly.  Attach bath mats  securely with double-sided non-slip rug tape.  Do not have throw rugs and other things on the floor that can make you trip. What can I do in the bedroom?  Use night lights.  Make sure that you have a light by your bed that is easy to reach.  Do not use any sheets or blankets that are too big for your bed. They should not hang down onto the floor.  Have a firm chair that has side arms. You can use this for support while you get dressed.  Do not have throw rugs and other things on the floor that can make you trip. What can I do in the kitchen?  Clean up any spills right away.  Avoid walking on wet floors.  Keep items that you use a lot in easy-to-reach places.  If you need to reach something above you, use a strong step stool that has a grab bar.  Keep electrical cords out of the way.  Do not use floor polish or wax that makes floors slippery. If you must use wax, use non-skid floor wax.  Do not have throw rugs and other things on the floor that can make you trip. What can I do  with my stairs?  Do not leave any items on the stairs.  Make sure that there are handrails on both sides of the stairs and use them. Fix handrails that are broken or loose. Make sure that handrails are as long as the stairways.  Check any carpeting to make sure that it is firmly attached to the stairs. Fix any carpet that is loose or worn.  Avoid having throw rugs at the top or bottom of the stairs. If you do have throw rugs, attach them to the floor with carpet tape.  Make sure that you have a light switch at the top of the stairs and the bottom of the stairs. If you do not have them, ask someone to add them for you. What else can I do to help prevent falls?  Wear shoes that:  Do not have high heels.  Have rubber bottoms.  Are comfortable and fit you well.  Are closed at the toe. Do not wear sandals.  If you use a stepladder:  Make sure that it is fully opened. Do not climb a closed  stepladder.  Make sure that both sides of the stepladder are locked into place.  Ask someone to hold it for you, if possible.  Clearly mark and make sure that you can see:  Any grab bars or handrails.  First and last steps.  Where the edge of each step is.  Use tools that help you move around (mobility aids) if they are needed. These include:  Canes.  Walkers.  Scooters.  Crutches.  Turn on the lights when you go into a dark area. Replace any light bulbs as soon as they burn out.  Set up your furniture so you have a clear path. Avoid moving your furniture around.  If any of your floors are uneven, fix them.  If there are any pets around you, be aware of where they are.  Review your medicines with your doctor. Some medicines can make you feel dizzy. This can increase your chance of falling. Ask your doctor what other things that you can do to help prevent falls. This information is not intended to replace advice given to you by your health care provider. Make sure you discuss any questions you have with your health care provider. Document Released: 10/20/2008 Document Revised: 06/01/2015 Document Reviewed: 01/28/2014 Elsevier Interactive Patient Education  2017 [employer].

## 2018-08-17 ENCOUNTER — Ambulatory Visit: Payer: PRIVATE HEALTH INSURANCE

## 2018-08-24 ENCOUNTER — Other Ambulatory Visit: Payer: Self-pay | Admitting: Family Medicine

## 2018-08-24 DIAGNOSIS — M109 Gout, unspecified: Secondary | ICD-10-CM | POA: Diagnosis not present

## 2018-08-24 DIAGNOSIS — I10 Essential (primary) hypertension: Secondary | ICD-10-CM | POA: Diagnosis not present

## 2018-08-24 DIAGNOSIS — E1140 Type 2 diabetes mellitus with diabetic neuropathy, unspecified: Secondary | ICD-10-CM | POA: Diagnosis not present

## 2018-08-24 DIAGNOSIS — E785 Hyperlipidemia, unspecified: Secondary | ICD-10-CM | POA: Diagnosis not present

## 2018-08-25 ENCOUNTER — Encounter: Payer: Self-pay | Admitting: Family Medicine

## 2018-08-25 LAB — URIC ACID: Uric Acid, Serum: 3.2 mg/dL (ref 2.5–7.0)

## 2018-08-25 LAB — CBC
HCT: 38.2 % (ref 35.0–45.0)
Hemoglobin: 12.6 g/dL (ref 11.7–15.5)
MCH: 28.6 pg (ref 27.0–33.0)
MCHC: 33 g/dL (ref 32.0–36.0)
MCV: 86.8 fL (ref 80.0–100.0)
MPV: 9.6 fL (ref 7.5–12.5)
Platelets: 306 10*3/uL (ref 140–400)
RBC: 4.4 10*6/uL (ref 3.80–5.10)
RDW: 14.6 % (ref 11.0–15.0)
WBC: 4.9 10*3/uL (ref 3.8–10.8)

## 2018-08-25 LAB — COMPLETE METABOLIC PANEL WITH GFR
AG Ratio: 1.7 (calc) (ref 1.0–2.5)
ALT: 34 U/L — ABNORMAL HIGH (ref 6–29)
AST: 32 U/L (ref 10–35)
Albumin: 4.3 g/dL (ref 3.6–5.1)
Alkaline phosphatase (APISO): 38 U/L (ref 37–153)
BUN: 15 mg/dL (ref 7–25)
CO2: 31 mmol/L (ref 20–32)
Calcium: 9.9 mg/dL (ref 8.6–10.4)
Chloride: 102 mmol/L (ref 98–110)
Creat: 0.64 mg/dL (ref 0.60–0.88)
GFR, Est African American: 96 mL/min/{1.73_m2} (ref >=60)
GFR, Est Non African American: 83 mL/min/{1.73_m2} (ref >=60)
Globulin: 2.5 g/dL (calc) (ref 1.9–3.7)
Glucose, Bld: 119 mg/dL — ABNORMAL HIGH (ref 65–99)
Potassium: 4.9 mmol/L (ref 3.5–5.3)
Sodium: 140 mmol/L (ref 135–146)
Total Bilirubin: 0.5 mg/dL (ref 0.2–1.2)
Total Protein: 6.8 g/dL (ref 6.1–8.1)

## 2018-08-25 LAB — HEMOGLOBIN A1C
Hgb A1c MFr Bld: 6.9 % of total Hgb — ABNORMAL HIGH (ref <5.7)
Mean Plasma Glucose: 151 (calc)
eAG (mmol/L): 8.4 (calc)

## 2018-08-25 LAB — LIPID PANEL
Cholesterol: 172 mg/dL (ref <200)
HDL: 47 mg/dL — ABNORMAL LOW (ref >=50)
LDL Cholesterol (Calc): 103 mg/dL (calc) — ABNORMAL HIGH
Non-HDL Cholesterol (Calc): 125 mg/dL (calc) (ref <130)
Total CHOL/HDL Ratio: 3.7 (calc) (ref <5.0)
Triglycerides: 121 mg/dL (ref <150)

## 2018-09-15 ENCOUNTER — Ambulatory Visit: Payer: PRIVATE HEALTH INSURANCE | Admitting: Family Medicine

## 2018-09-16 ENCOUNTER — Other Ambulatory Visit: Payer: Self-pay

## 2018-09-16 ENCOUNTER — Encounter: Payer: Self-pay | Admitting: Family Medicine

## 2018-09-16 ENCOUNTER — Ambulatory Visit (INDEPENDENT_AMBULATORY_CARE_PROVIDER_SITE_OTHER): Payer: Medicare Other | Admitting: Family Medicine

## 2018-09-16 DIAGNOSIS — E1140 Type 2 diabetes mellitus with diabetic neuropathy, unspecified: Secondary | ICD-10-CM

## 2018-09-16 DIAGNOSIS — I10 Essential (primary) hypertension: Secondary | ICD-10-CM | POA: Diagnosis not present

## 2018-09-16 MED ORDER — TRIAMTERENE-HCTZ 75-50 MG PO TABS: 1.0000 | ORAL_TABLET | Freq: Every day | ORAL | 0 refills | Status: DC
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
## 2018-09-16 NOTE — Patient Instructions (Signed)
Thank you for completing your visit via telephone. I appreciate the opportunity to provide you with the care for your health and wellness. Today we discussed: Blood pressure and diabetes  Follow-up: Flu clinic appointment, and appointment in December for follow-up in office  No labs today  Please continue all current medications that you are taking at this time.  Refills have been provided for you today.  Please continue to work on a heart healthy, low carbohydrate, low sugar diet.  Try to avoid extra fried foods and processed foods.  Work on walking daily for 20 to 30 minutes.  This can be broken up into 2 different timeframes.  10 minutes at a time if that is best.  Please continue to practice social distancing to keep you, your family, and our community safe.  If you must go out, please wear a Mask and practice good handwashing.  [place] YOUR HANDS WELL AND FREQUENTLY. AVOID TOUCHING YOUR FACE, UNLESS YOUR HANDS ARE FRESHLY WASHED.  GET FRESH AIR DAILY. STAY HYDRATED WITH WATER.   It was a pleasure to see you and I look forward to continuing to work together on your health and well-being. Please do not hesitate to call the office if you need care or have questions about your care.  Have a wonderful day and week. With Gratitude, Cherly Beach, DNP, AGNP-BC

## 2018-09-21 ENCOUNTER — Other Ambulatory Visit: Payer: Self-pay

## 2018-09-21 ENCOUNTER — Ambulatory Visit (INDEPENDENT_AMBULATORY_CARE_PROVIDER_SITE_OTHER): Payer: Medicare Other

## 2018-09-21 DIAGNOSIS — Z23 Encounter for immunization: Secondary | ICD-10-CM | POA: Diagnosis not present

## 2018-09-23 DIAGNOSIS — I739 Peripheral vascular disease, unspecified: Secondary | ICD-10-CM | POA: Diagnosis not present

## 2018-09-23 DIAGNOSIS — M79671 Pain in right foot: Secondary | ICD-10-CM | POA: Diagnosis not present

## 2018-09-23 DIAGNOSIS — E1140 Type 2 diabetes mellitus with diabetic neuropathy, unspecified: Secondary | ICD-10-CM | POA: Diagnosis not present

## 2018-09-23 DIAGNOSIS — E1151 Type 2 diabetes mellitus with diabetic peripheral angiopathy without gangrene: Secondary | ICD-10-CM | POA: Diagnosis not present

## 2018-09-23 DIAGNOSIS — L110 Acquired keratosis follicularis: Secondary | ICD-10-CM | POA: Diagnosis not present

## 2018-10-17 ENCOUNTER — Other Ambulatory Visit: Payer: Self-pay | Admitting: Family Medicine

## 2018-10-17 DIAGNOSIS — E785 Hyperlipidemia, unspecified: Secondary | ICD-10-CM

## 2018-10-17 DIAGNOSIS — E669 Obesity, unspecified: Secondary | ICD-10-CM

## 2018-10-17 DIAGNOSIS — Z23 Encounter for immunization: Secondary | ICD-10-CM

## 2018-10-17 DIAGNOSIS — E1169 Type 2 diabetes mellitus with other specified complication: Secondary | ICD-10-CM

## 2018-10-17 DIAGNOSIS — I10 Essential (primary) hypertension: Secondary | ICD-10-CM

## 2018-11-02 ENCOUNTER — Other Ambulatory Visit: Payer: Self-pay

## 2018-11-02 MED ORDER — ALLOPURINOL 300 MG PO TABS: 300.0000 mg | ORAL_TABLET | Freq: Every day | ORAL | 1 refills | Status: DC

## 2018-11-12 ENCOUNTER — Encounter (HOSPITAL_COMMUNITY): Payer: Self-pay | Admitting: Emergency Medicine

## 2018-11-12 ENCOUNTER — Other Ambulatory Visit: Payer: Self-pay

## 2018-11-12 ENCOUNTER — Emergency Department (HOSPITAL_COMMUNITY)
Admission: EM | Admit: 2018-11-12 | Discharge: 2018-11-12 | Disposition: A | Discharge status: Home / Self Care | Payer: Medicare Other | Attending: Emergency Medicine | Admitting: Emergency Medicine

## 2018-11-12 ENCOUNTER — Emergency Department (HOSPITAL_COMMUNITY): Payer: Medicare Other

## 2018-11-12 DIAGNOSIS — Z7984 Long term (current) use of oral hypoglycemic drugs: Secondary | ICD-10-CM | POA: Insufficient documentation

## 2018-11-12 DIAGNOSIS — W010XXA Fall on same level from slipping, tripping and stumbling without subsequent striking against object, initial encounter: Secondary | ICD-10-CM | POA: Diagnosis not present

## 2018-11-12 DIAGNOSIS — M25462 Effusion, left knee: Secondary | ICD-10-CM | POA: Diagnosis not present

## 2018-11-12 DIAGNOSIS — S8392XA Sprain of unspecified site of left knee, initial encounter: Secondary | ICD-10-CM | POA: Insufficient documentation

## 2018-11-12 DIAGNOSIS — I10 Essential (primary) hypertension: Secondary | ICD-10-CM | POA: Diagnosis not present

## 2018-11-12 DIAGNOSIS — M7989 Other specified soft tissue disorders: Secondary | ICD-10-CM | POA: Diagnosis not present

## 2018-11-12 DIAGNOSIS — Z7982 Long term (current) use of aspirin: Secondary | ICD-10-CM | POA: Diagnosis not present

## 2018-11-12 DIAGNOSIS — S8992XA Unspecified injury of left lower leg, initial encounter: Secondary | ICD-10-CM | POA: Diagnosis present

## 2018-11-12 DIAGNOSIS — Z79899 Other long term (current) drug therapy: Secondary | ICD-10-CM | POA: Diagnosis not present

## 2018-11-12 DIAGNOSIS — Y939 Activity, unspecified: Secondary | ICD-10-CM | POA: Insufficient documentation

## 2018-11-12 DIAGNOSIS — Y929 Unspecified place or not applicable: Secondary | ICD-10-CM | POA: Diagnosis not present

## 2018-11-12 DIAGNOSIS — E119 Type 2 diabetes mellitus without complications: Secondary | ICD-10-CM | POA: Diagnosis not present

## 2018-11-12 DIAGNOSIS — Y999 Unspecified external cause status: Secondary | ICD-10-CM | POA: Diagnosis not present

## 2018-11-12 NOTE — Discharge Instructions (Signed)
Apply ice packs on and off to your knee.  Wear the knee brace as needed for support when weightbearing.  Do not wear continuously or at night.  You may call one of the orthopedic providers listed to arrange follow-up appointment in a few days if your symptoms are not improving.  Continue tylenol every 4 hrs if needed for pain

## 2018-11-12 NOTE — ED Triage Notes (Signed)
Pt states that she tripped over her pjs this morning and hurt her left knee.
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
## 2018-11-18 DIAGNOSIS — M25562 Pain in left knee: Secondary | ICD-10-CM | POA: Diagnosis not present

## 2018-11-19 ENCOUNTER — Other Ambulatory Visit: Payer: Self-pay

## 2018-11-19 ENCOUNTER — Telehealth: Payer: Self-pay | Admitting: *Deleted

## 2018-11-19 DIAGNOSIS — Z9013 Acquired absence of bilateral breasts and nipples: Secondary | ICD-10-CM

## 2018-11-19 MED ORDER — UNABLE TO FIND: 0 refills | Status: DC

## 2018-11-19 NOTE — Telephone Encounter (Signed)
Pt would like a prescription for bras to be sent to [facility]

## 2018-11-19 NOTE — Telephone Encounter (Signed)
Prescription sent to [facility]

## 2018-11-23 DIAGNOSIS — E119 Type 2 diabetes mellitus without complications: Secondary | ICD-10-CM | POA: Diagnosis not present

## 2018-11-23 DIAGNOSIS — H04123 Dry eye syndrome of bilateral lacrimal glands: Secondary | ICD-10-CM | POA: Diagnosis not present

## 2018-11-23 DIAGNOSIS — H2513 Age-related nuclear cataract, bilateral: Secondary | ICD-10-CM | POA: Diagnosis not present

## 2018-11-23 DIAGNOSIS — H18513 Endothelial corneal dystrophy, bilateral: Secondary | ICD-10-CM | POA: Diagnosis not present

## 2018-11-23 LAB — HM DIABETES EYE EXAM

## 2018-11-24 ENCOUNTER — Other Ambulatory Visit: Payer: Self-pay | Admitting: Family Medicine

## 2018-12-14 ENCOUNTER — Other Ambulatory Visit: Payer: Self-pay

## 2018-12-14 MED ORDER — POTASSIUM CHLORIDE CRYS ER 10 MEQ PO TBCR: 10.0000 meq | EXTENDED_RELEASE_TABLET | Freq: Two times a day (BID) | ORAL | 1 refills | Status: DC

## 2018-12-15 ENCOUNTER — Ambulatory Visit: Payer: Medicare Other | Admitting: Family Medicine

## 2018-12-16 ENCOUNTER — Other Ambulatory Visit: Payer: Self-pay

## 2018-12-16 ENCOUNTER — Encounter: Payer: Self-pay | Admitting: Family Medicine

## 2018-12-16 ENCOUNTER — Ambulatory Visit (INDEPENDENT_AMBULATORY_CARE_PROVIDER_SITE_OTHER): Payer: Medicare Other | Admitting: Family Medicine

## 2018-12-16 VITALS — BP 136/68 | HR 102 | Temp 98.2°F | Resp 15 | Ht 63.0 in | Wt 183.0 lb

## 2018-12-16 DIAGNOSIS — E785 Hyperlipidemia, unspecified: Secondary | ICD-10-CM

## 2018-12-16 DIAGNOSIS — E1140 Type 2 diabetes mellitus with diabetic neuropathy, unspecified: Secondary | ICD-10-CM

## 2018-12-16 DIAGNOSIS — I10 Essential (primary) hypertension: Secondary | ICD-10-CM

## 2018-12-16 MED ORDER — TRIAMTERENE-HCTZ 75-50 MG PO TABS: 1.0000 | ORAL_TABLET | Freq: Every day | ORAL | 0 refills | Status: DC

## 2018-12-16 NOTE — Patient Instructions (Addendum)
  I appreciate the opportunity to provide you with care for your health and wellness. Today we discussed: BP and DM   Follow up: 4 month   Labs placed today-get 1 week before next appt.   I hope you have a wonderful, happy, safe, and healthy Holiday Season! See you in the New Year :)  Please continue to practice social distancing to keep you, your family, and our community safe.  If you must go out, please wear a mask and practice good handwashing.  It was a pleasure to see you and I look forward to continuing to work together on your health and well-being. Please do not hesitate to call the office if you need care or have questions about your care.  Have a wonderful day and week. With Gratitude, Cherly Beach, DNP, AGNP-BC
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
## 2018-12-24 DIAGNOSIS — C50911 Malignant neoplasm of unspecified site of right female breast: Secondary | ICD-10-CM | POA: Diagnosis not present

## 2018-12-24 DIAGNOSIS — C50912 Malignant neoplasm of unspecified site of left female breast: Secondary | ICD-10-CM | POA: Diagnosis not present

## 2018-12-24 DIAGNOSIS — Z9013 Acquired absence of bilateral breasts and nipples: Secondary | ICD-10-CM | POA: Diagnosis not present

## 2018-12-29 DIAGNOSIS — M79671 Pain in right foot: Secondary | ICD-10-CM | POA: Diagnosis not present

## 2018-12-29 DIAGNOSIS — E1140 Type 2 diabetes mellitus with diabetic neuropathy, unspecified: Secondary | ICD-10-CM | POA: Diagnosis not present

## 2018-12-29 DIAGNOSIS — I739 Peripheral vascular disease, unspecified: Secondary | ICD-10-CM | POA: Diagnosis not present

## 2018-12-29 DIAGNOSIS — L110 Acquired keratosis follicularis: Secondary | ICD-10-CM | POA: Diagnosis not present

## 2018-12-29 DIAGNOSIS — E1151 Type 2 diabetes mellitus with diabetic peripheral angiopathy without gangrene: Secondary | ICD-10-CM | POA: Diagnosis not present

## 2019-01-18 ENCOUNTER — Telehealth: Payer: Self-pay

## 2019-01-18 NOTE — Telephone Encounter (Signed)
Pt is calling to see if she needs the Covid vaccine and does Dr Moshe Cipro Recommend

## 2019-01-18 NOTE — Telephone Encounter (Signed)
Advised patient to call oncologist and ask them if they recommend the covid vaccine for her

## 2019-01-22 ENCOUNTER — Other Ambulatory Visit: Payer: Self-pay | Admitting: Family Medicine

## 2019-01-22 DIAGNOSIS — Z23 Encounter for immunization: Secondary | ICD-10-CM

## 2019-01-22 DIAGNOSIS — E785 Hyperlipidemia, unspecified: Secondary | ICD-10-CM

## 2019-01-22 DIAGNOSIS — E1169 Type 2 diabetes mellitus with other specified complication: Secondary | ICD-10-CM

## 2019-01-22 DIAGNOSIS — I10 Essential (primary) hypertension: Secondary | ICD-10-CM

## 2019-02-24 ENCOUNTER — Other Ambulatory Visit: Payer: Self-pay | Admitting: Family Medicine

## 2019-03-09 DIAGNOSIS — E1140 Type 2 diabetes mellitus with diabetic neuropathy, unspecified: Secondary | ICD-10-CM | POA: Diagnosis not present

## 2019-03-09 DIAGNOSIS — M79671 Pain in right foot: Secondary | ICD-10-CM | POA: Diagnosis not present

## 2019-03-09 DIAGNOSIS — L110 Acquired keratosis follicularis: Secondary | ICD-10-CM | POA: Diagnosis not present

## 2019-03-09 DIAGNOSIS — I739 Peripheral vascular disease, unspecified: Secondary | ICD-10-CM | POA: Diagnosis not present

## 2019-03-09 DIAGNOSIS — E1151 Type 2 diabetes mellitus with diabetic peripheral angiopathy without gangrene: Secondary | ICD-10-CM | POA: Diagnosis not present

## 2019-03-15 ENCOUNTER — Other Ambulatory Visit: Payer: Self-pay | Admitting: Family Medicine

## 2019-04-08 ENCOUNTER — Other Ambulatory Visit: Payer: Self-pay | Admitting: Family Medicine

## 2019-04-14 ENCOUNTER — Other Ambulatory Visit: Payer: Self-pay | Admitting: Family Medicine

## 2019-04-15 ENCOUNTER — Telehealth: Payer: Self-pay | Admitting: Family Medicine

## 2019-04-15 DIAGNOSIS — E559 Vitamin D deficiency, unspecified: Secondary | ICD-10-CM

## 2019-04-15 DIAGNOSIS — E1140 Type 2 diabetes mellitus with diabetic neuropathy, unspecified: Secondary | ICD-10-CM

## 2019-04-15 DIAGNOSIS — E785 Hyperlipidemia, unspecified: Secondary | ICD-10-CM

## 2019-04-15 DIAGNOSIS — I10 Essential (primary) hypertension: Secondary | ICD-10-CM

## 2019-04-15 NOTE — Telephone Encounter (Signed)
Labs past due and needed, fasting lipid, cmp and EGFr, hBA1C, TSH , vit D and microalb, please advise in next 1 week, thanks

## 2019-04-19 ENCOUNTER — Other Ambulatory Visit: Payer: Self-pay | Admitting: Family Medicine

## 2019-04-19 ENCOUNTER — Ambulatory Visit: Payer: Medicare Other | Admitting: Family Medicine

## 2019-04-19 NOTE — Addendum Note (Signed)
Addended by: Eual Fines on: 04/19/2019 08:36 PM   Modules accepted: Orders

## 2019-04-20 ENCOUNTER — Other Ambulatory Visit: Payer: Self-pay

## 2019-04-20 ENCOUNTER — Encounter: Payer: Self-pay | Admitting: Family Medicine

## 2019-04-20 ENCOUNTER — Ambulatory Visit (INDEPENDENT_AMBULATORY_CARE_PROVIDER_SITE_OTHER): Payer: Medicare Other | Admitting: Family Medicine

## 2019-04-20 VITALS — BP 138/82 | HR 88 | Temp 97.7°F | Resp 16 | Ht 63.0 in | Wt 181.1 lb

## 2019-04-20 DIAGNOSIS — E1140 Type 2 diabetes mellitus with diabetic neuropathy, unspecified: Secondary | ICD-10-CM | POA: Diagnosis not present

## 2019-04-20 DIAGNOSIS — K219 Gastro-esophageal reflux disease without esophagitis: Secondary | ICD-10-CM | POA: Diagnosis not present

## 2019-04-20 DIAGNOSIS — E785 Hyperlipidemia, unspecified: Secondary | ICD-10-CM

## 2019-04-20 DIAGNOSIS — I10 Essential (primary) hypertension: Secondary | ICD-10-CM

## 2019-04-20 DIAGNOSIS — M109 Gout, unspecified: Secondary | ICD-10-CM

## 2019-04-20 DIAGNOSIS — E1159 Type 2 diabetes mellitus with other circulatory complications: Secondary | ICD-10-CM | POA: Diagnosis not present

## 2019-04-20 MED ORDER — UNABLE TO FIND: 0 refills | Status: DC

## 2019-04-20 MED ORDER — GABAPENTIN 100 MG PO CAPS: 100.0000 mg | ORAL_CAPSULE | Freq: Every day | ORAL | 0 refills | Status: DC

## 2019-04-20 NOTE — Patient Instructions (Addendum)
I appreciate the opportunity to provide you with care for your health and wellness. Today we discussed: diabetes and overall health   Follow up: 4 months   Labs fasting in the next week No referrals today  Please continue to practice social distancing to keep you, your family, and our community safe.  If you must go out, please wear a mask and practice good handwashing.  It was a pleasure to see you and I look forward to continuing to work together on your health and well-being. Please do not hesitate to call the office if you need care or have questions about your care.  Have a wonderful day and week. With Gratitude, Cherly Beach, DNP, AGNP-BC

## 2019-04-20 NOTE — Assessment & Plan Note (Signed)
Controlled, uses Prilosec as needed

## 2019-04-20 NOTE — Assessment & Plan Note (Signed)
Lisa Mann is encouraged to maintain a well balanced diet that is low in salt. -could be better-Controlled, continue current medication regimen.  Additionally, she is also reminded that exercise is beneficial for heart health and control of  Blood pressure. 30-60 minutes daily is recommended-walking was suggested.

## 2019-04-20 NOTE — Assessment & Plan Note (Signed)
Encourage low-fat diet heart healthy diet.

## 2019-04-20 NOTE — Progress Notes (Addendum)
Subjective:  Patient ID: Lisa Mann, female    DOB: 1933/08/25  Age: 86 y.o. MRN: [id number]  CC:  Chief Complaint  Patient presents with  . Diabetes    4 month follow up visit. needs foot exam for diabetic shoes   . Hyperlipidemia      HPI  HPI Lisa Mann is a 85-year.  Has history of diabetes, hypertension, hyperlipidemia among others.  Lisa Mann denies having any falls, does use a cane.  Would like to get diabetic shoes.  Has shoes off provider entering the room.  Denies having any trouble with seeing her foot or keeping her foot clean.  Reports that she has had some itching on the bottom of her foot.  She uses peppermint oil and Ponds cream she does not think that helps too much.  She reports that she has some numbness and tingling along the edge and foot it predominantly bothers her at night.  Keeps her from sleeping well.  She reports taking all her medications without any issue.  Does want to know if there is any medications that she can come off of.  And monitor the this today.  She denies having any trouble eating, swallowing or chewing.  Reports drinking and trying to increase that.  Wears glasses upcoming eye appointment.  Is hard of hearing does have an appointment with the audiologist coming up.  Today patient denies signs and symptoms of COVID 19 infection including fever, chills, cough, shortness of breath, and headache. Past Medical, Surgical, Social History, Allergies, and Medications have been Reviewed.   Past Medical History:  Diagnosis Date  . Breast cancer, right breast ([place]) 2010  . Cancer ([place]) 2008 and 2010   , first was left then right  . Cancer of breast, intraductal 2008    left ( treated surgically and with radiation)  . Diabetes mellitus, type 2 ([place])    controlled   . DJD (degenerative joint disease)   . Fracture of pelvis   . Fracture of wrist    left   . Fracture, ribs   . GERD (gastroesophageal reflux disease)   . Gout   .  Hyperlipidemia   . Hypertension   . Low back pain   . Lumbar radiculopathy   . Obesity     Current Meds  Medication Sig  . aspirin (ASPIRIN LOW DOSE) 81 MG EC tablet Take 81 mg by mouth daily.    Marland Kitchen atorvastatin (LIPITOR) 10 MG tablet TAKE 1 TABLET(10 MG) BY MOUTH DAILY  . blood glucose meter kit and supplies Dispense based on patient and insurance preference. Test once daily (FOR ICD-10 E10.9, E11.9).  . Calcium-Vitamin D (CALTRATE 600 PLUS-VIT D PO) Take 1 tablet by mouth daily.   . fluticasone (FLONASE) 50 MCG/ACT nasal spray Place 2 sprays into the nose daily. (Patient taking differently: Place 2 sprays into the nose as needed. )  . metFORMIN (GLUCOPHAGE) 1000 MG tablet TAKE 1 TABLET BY MOUTH TWICE DAILY  . omeprazole (PRILOSEC) 20 MG capsule TAKE 1 CAPSULE BY MOUTH DAILY  . potassium chloride (KLOR-CON) 10 MEQ tablet Take 1 tablet (10 mEq total) by mouth 2 (two) times daily.  . ramipril (ALTACE) 10 MG capsule TAKE 1 CAPSULE(10 MG) BY MOUTH DAILY  . triamterene-hydrochlorothiazide (MAXZIDE) 75-50 MG tablet TAKE 1 TABLET BY MOUTH DAILY  . [DISCONTINUED] allopurinol (ZYLOPRIM) 300 MG tablet Take 1 tablet (300 mg total) by mouth daily.    ROS:  Review of Systems  Constitutional: Negative.   HENT: Negative.   Eyes: Negative.   Respiratory: Negative.   Cardiovascular: Negative.   Gastrointestinal: Negative.   Genitourinary: Negative.   Musculoskeletal: Negative.   Skin: Positive for itching.  Neurological: Positive for sensory change.  Endo/Heme/Allergies: Negative.   Psychiatric/Behavioral: Negative.   All other systems reviewed and are negative.  See HPI All other systems negative  Objective:   Today's Vitals: BP 138/82   Pulse 88   Temp 97.7 F (36.5 C) (Temporal)   Resp 16   Ht 5' 3" (1.6 m)   Wt 181 lb 1.9 oz (82.2 kg)   SpO2 98%   BMI 32.08 kg/m  Vitals with BMI 04/20/2019 12/16/2018 11/12/2018  Height 5' 3" 5' 3" -  Weight 181 lbs 2 oz 183 lbs 1 oz -  BMI  28.78 67.67 -  Systolic 209 470 962  Diastolic 82 68 74  Pulse 88 102 92     Physical Exam Vitals and nursing note reviewed.  Constitutional:      Appearance: Normal appearance. She is obese.  HENT:     Head: Normocephalic and atraumatic.     Right Ear: External ear normal.     Left Ear: External ear normal.     Mouth/Throat:     Comments: Mask in place Eyes:     General:        Right eye: No discharge.        Left eye: No discharge.     Conjunctiva/sclera: Conjunctivae normal.     Comments: Glasses  Cardiovascular:     Rate and Rhythm: Normal rate and regular rhythm.     Pulses:          Dorsalis pedis pulses are 2+ on the right side and 2+ on the left side.       Posterior tibial pulses are 1+ on the right side and 1+ on the left side.     Heart sounds: Normal heart sounds.  Pulmonary:     Effort: Pulmonary effort is normal.     Breath sounds: Normal breath sounds.  Musculoskeletal:     Cervical back: Normal range of motion and neck supple.     Right foot: Normal range of motion.     Left foot: Normal range of motion.     Comments: Cane   Feet:     Right foot:     Skin integrity: Dry skin present. No ulcer, blister or skin breakdown.     Toenail Condition: Fungal disease present.    Left foot:     Skin integrity: Dry skin present. No ulcer, blister or skin breakdown.     Toenail Condition: Fungal disease present. Skin:    General: Skin is warm.  Neurological:     General: No focal deficit present.     Mental Status: She is alert and oriented to person, place, and time.  Psychiatric:        Mood and Affect: Mood normal.        Behavior: Behavior normal.        Thought Content: Thought content normal.        Judgment: Judgment normal.     Diabetic Foot Form - Detailed   Diabetic Foot Exam - detailed Diabetic Foot exam was performed with the following findings: Yes 04/20/2019 10:20 AM  Visual Foot Exam completed.: Yes  Can the patient see the bottom of their  feet?: Yes Are the shoes appropriate in style and fit?: Yes Is  there swelling or and abnormal foot shape?: Yes Is there a claw toe deformity?: No Is there elevated skin temparature?: No Is there foot or ankle muscle weakness?: No Normal Range of Motion: No Pulse Foot Exam completed.: Yes  Right posterior Tibialias: Present Left posterior Tibialias: Present  Right Dorsalis Pedis: Present Left Dorsalis Pedis: Present  Sensory Foot Exam Completed.: Yes Semmes-Weinstein Monofilament Test R Site 1-Great Toe: Pos L Site 1-Great Toe: Pos       There is diminished sensory bilaterally for both feet, in addition to some questionable changes in arterial blood flow bilaterally with fainter pulses noted.   Assessment   1. Type 2 diabetes mellitus with vascular disease ([place])   2. Type 2 diabetes mellitus with diabetic neuropathy, without long-term current use of insulin ([place])   3. Gastroesophageal reflux disease, unspecified whether esophagitis present   4. HTN, goal below 130/80   5. Gout, unspecified cause, unspecified chronicity, unspecified site   6. Hyperlipidemia with target LDL less than 100     Tests ordered No orders of the defined types were placed in this encounter.    Plan: Please see assessment and plan per problem list above.   Meds ordered this encounter  Medications  . gabapentin (NEURONTIN) 100 MG capsule    Sig: Take 1 capsule (100 mg total) by mouth at bedtime.    Dispense:  30 capsule    Refill:  0    Order Specific Question:   Supervising Provider    Answer:   SIMPSON, MARGARET E [[id number]]  . UNABLE TO FIND    Sig: Diabetic shoes x 1 pair Inserts x 3 pair DX e11.9    Dispense:  1 each    Refill:  0  . UNABLE TO FIND    Sig: DIabetic shoes x 1 inserts x 3  Dx e11.9    Dispense:  1 each    Refill:  0    Patient to follow-up in 08/17/2019 .  Perlie Mayo, NP

## 2019-04-20 NOTE — Progress Notes (Deleted)
     Subjective:  Patient ID: Lisa Mann, female    DOB: 1933/05/10  Age: 86 y.o. MRN: [id number]  CC:  Chief Complaint  Patient presents with  . Diabetes    4 month follow up visit. needs foot exam for diabetic shoes   . Hyperlipidemia      HPI  HPI    Today patient denies signs and symptoms of COVID 19 infection including fever, chills, cough, shortness of breath, and headache. Past Medical, Surgical, Social History, Allergies, and Medications have been Reviewed.   Past Medical History:  Diagnosis Date  . Breast cancer, right breast ([place]) 2010  . Cancer ([place]) 2008 and 2010   , first was left then right  . Cancer of breast, intraductal 2008    left ( treated surgically and with radiation)  . Diabetes mellitus, type 2 ([place])    controlled   . DJD (degenerative joint disease)   . Fracture of pelvis   . Fracture of wrist    left   . Fracture, ribs   . GERD (gastroesophageal reflux disease)   . Gout   . Hyperlipidemia   . Hypertension   . Low back pain   . Lumbar radiculopathy   . Obesity     Current Meds  Medication Sig  . allopurinol (ZYLOPRIM) 300 MG tablet Take 1 tablet (300 mg total) by mouth daily.  Marland Kitchen aspirin (ASPIRIN LOW DOSE) 81 MG EC tablet Take 81 mg by mouth daily.    Marland Kitchen atorvastatin (LIPITOR) 10 MG tablet TAKE 1 TABLET(10 MG) BY MOUTH DAILY  . blood glucose meter kit and supplies Dispense based on patient and insurance preference. Test once daily (FOR ICD-10 E10.9, E11.9).  . Calcium-Vitamin D (CALTRATE 600 PLUS-VIT D PO) Take 1 tablet by mouth daily.   . fluticasone (FLONASE) 50 MCG/ACT nasal spray Place 2 sprays into the nose daily. (Patient taking differently: Place 2 sprays into the nose as needed. )  . metFORMIN (GLUCOPHAGE) 1000 MG tablet TAKE 1 TABLET BY MOUTH TWICE DAILY  . omeprazole (PRILOSEC) 20 MG capsule TAKE 1 CAPSULE BY MOUTH DAILY  . potassium chloride (KLOR-CON) 10 MEQ tablet Take 1 tablet (10 mEq total) by mouth 2 (two) times daily.   . ramipril (ALTACE) 10 MG capsule TAKE 1 CAPSULE(10 MG) BY MOUTH DAILY  . triamterene-hydrochlorothiazide (MAXZIDE) 75-50 MG tablet TAKE 1 TABLET BY MOUTH DAILY    ROS:  ROS   Objective:   Today's Vitals: BP 138/82   Pulse 88   Temp 97.7 F (36.5 C) (Temporal)   Resp 16   Ht _0  (1.6 m)   Wt 181 lb 1.9 oz (82.2 kg)   SpO2 98%   BMI 32.08 kg/m  Vitals with BMI 04/20/2019 12/16/2018 11/12/2018  Height _1  _2  -  Weight 181 lbs 2 oz 183 lbs 1 oz -  BMI 41.32 44.01 -  Systolic 027 253 664  Diastolic 82 68 74  Pulse 88 102 92     Physical Exam       Assessment   No diagnosis found.  Tests ordered No orders of the defined types were placed in this encounter.    Plan: Please see assessment and plan per problem list above.   No orders of the defined types were placed in this encounter.   Patient to follow-up in ***.  Perlie Mayo, NP

## 2019-04-20 NOTE — Assessment & Plan Note (Signed)
No recent flare, takes allopurinol.  Reports that she does not follow the best gout free diet.  She would like to stay on this medication

## 2019-04-28 DIAGNOSIS — E785 Hyperlipidemia, unspecified: Secondary | ICD-10-CM | POA: Diagnosis not present

## 2019-04-28 DIAGNOSIS — R7989 Other specified abnormal findings of blood chemistry: Secondary | ICD-10-CM | POA: Diagnosis not present

## 2019-04-28 DIAGNOSIS — E559 Vitamin D deficiency, unspecified: Secondary | ICD-10-CM | POA: Diagnosis not present

## 2019-04-28 DIAGNOSIS — E1140 Type 2 diabetes mellitus with diabetic neuropathy, unspecified: Secondary | ICD-10-CM | POA: Diagnosis not present

## 2019-05-01 LAB — LIPID PANEL
Cholesterol: 167 mg/dL (ref <200)
HDL: 52 mg/dL (ref >=50)
LDL Cholesterol (Calc): 94 mg/dL (calc)
Non-HDL Cholesterol (Calc): 115 mg/dL (calc) (ref <130)
Total CHOL/HDL Ratio: 3.2 (calc) (ref <5.0)
Triglycerides: 110 mg/dL (ref <150)

## 2019-05-01 LAB — COMPLETE METABOLIC PANEL WITH GFR
AG Ratio: 1.7 (calc) (ref 1.0–2.5)
ALT: 25 U/L (ref 6–29)
AST: 20 U/L (ref 10–35)
Albumin: 4.4 g/dL (ref 3.6–5.1)
Alkaline phosphatase (APISO): 40 U/L (ref 37–153)
BUN: 19 mg/dL (ref 7–25)
CO2: 31 mmol/L (ref 20–32)
Calcium: 10.1 mg/dL (ref 8.6–10.4)
Chloride: 101 mmol/L (ref 98–110)
Creat: 0.7 mg/dL (ref 0.60–0.88)
GFR, Est African American: 93 mL/min/{1.73_m2} (ref >=60)
GFR, Est Non African American: 80 mL/min/{1.73_m2} (ref >=60)
Globulin: 2.6 g/dL (calc) (ref 1.9–3.7)
Glucose, Bld: 122 mg/dL — ABNORMAL HIGH (ref 65–99)
Potassium: 4.3 mmol/L (ref 3.5–5.3)
Sodium: 141 mmol/L (ref 135–146)
Total Bilirubin: 0.4 mg/dL (ref 0.2–1.2)
Total Protein: 7 g/dL (ref 6.1–8.1)

## 2019-05-01 LAB — TEST AUTHORIZATION

## 2019-05-01 LAB — VITAMIN D 25 HYDROXY (VIT D DEFICIENCY, FRACTURES): Vit D, 25-Hydroxy: 29 ng/mL — ABNORMAL LOW (ref 30–100)

## 2019-05-01 LAB — T4, FREE: Free T4: 1.2 ng/dL (ref 0.8–1.8)

## 2019-05-01 LAB — HEMOGLOBIN A1C
Hgb A1c MFr Bld: 6.8 % of total Hgb — ABNORMAL HIGH (ref <5.7)
Mean Plasma Glucose: 148 (calc)
eAG (mmol/L): 8.2 (calc)

## 2019-05-01 LAB — T3, FREE: T3, Free: 3.3 pg/mL (ref 2.3–4.2)

## 2019-05-01 LAB — TSH: TSH: 0.13 mIU/L — ABNORMAL LOW (ref 0.40–4.50)

## 2019-05-02 ENCOUNTER — Other Ambulatory Visit: Payer: Self-pay | Admitting: Family Medicine

## 2019-05-03 ENCOUNTER — Other Ambulatory Visit: Payer: Self-pay | Admitting: Family Medicine

## 2019-05-06 ENCOUNTER — Other Ambulatory Visit: Payer: Self-pay

## 2019-05-06 MED ORDER — UNABLE TO FIND: 0 refills | Status: DC

## 2019-05-11 ENCOUNTER — Telehealth: Payer: Self-pay

## 2019-05-11 ENCOUNTER — Other Ambulatory Visit: Payer: Self-pay

## 2019-05-11 MED ORDER — UNABLE TO FIND: 0 refills | Status: DC

## 2019-05-11 NOTE — Telephone Encounter (Signed)
Medicare requires you Lisa Mann 4/13 note and sign the diabetic shoe rx and the diabetic shoe form (we will mail the rx and form to you) Please Lisa Mann 4/13 note and I will print once done

## 2019-05-11 NOTE — Telephone Encounter (Signed)
This is cosigned

## 2019-05-12 NOTE — Telephone Encounter (Signed)
Mailed diabetic shoe rx and form to be signed by MD. Will fax once returned

## 2019-05-23 ENCOUNTER — Other Ambulatory Visit: Payer: Self-pay | Admitting: Family Medicine

## 2019-05-25 ENCOUNTER — Inpatient Hospital Stay (HOSPITAL_COMMUNITY): Payer: Medicare Other | Attending: Hematology

## 2019-05-25 ENCOUNTER — Other Ambulatory Visit: Payer: Self-pay

## 2019-05-25 DIAGNOSIS — Z9221 Personal history of antineoplastic chemotherapy: Secondary | ICD-10-CM | POA: Diagnosis not present

## 2019-05-25 DIAGNOSIS — Z171 Estrogen receptor negative status [ER-]: Secondary | ICD-10-CM | POA: Insufficient documentation

## 2019-05-25 DIAGNOSIS — Z79899 Other long term (current) drug therapy: Secondary | ICD-10-CM | POA: Diagnosis not present

## 2019-05-25 DIAGNOSIS — Z9013 Acquired absence of bilateral breasts and nipples: Secondary | ICD-10-CM | POA: Insufficient documentation

## 2019-05-25 DIAGNOSIS — Z853 Personal history of malignant neoplasm of breast: Secondary | ICD-10-CM | POA: Insufficient documentation

## 2019-05-25 LAB — CBC WITH DIFFERENTIAL/PLATELET
Abs Immature Granulocytes: 0.02 10*3/uL (ref 0.00–0.07)
Basophils Absolute: 0 10*3/uL (ref 0.0–0.1)
Basophils Relative: 1 %
Eosinophils Absolute: 0.2 10*3/uL (ref 0.0–0.5)
Eosinophils Relative: 3 %
HCT: 38.6 % (ref 36.0–46.0)
Hemoglobin: 12.4 g/dL (ref 12.0–15.0)
Immature Granulocytes: 0 %
Lymphocytes Relative: 35 %
Lymphs Abs: 1.9 10*3/uL (ref 0.7–4.0)
MCH: 29 pg (ref 26.0–34.0)
MCHC: 32.1 g/dL (ref 30.0–36.0)
MCV: 90.4 fL (ref 80.0–100.0)
Monocytes Absolute: 0.5 10*3/uL (ref 0.1–1.0)
Monocytes Relative: 10 %
Neutro Abs: 2.7 10*3/uL (ref 1.7–7.7)
Neutrophils Relative %: 51 %
Platelets: 295 10*3/uL (ref 150–400)
RBC: 4.27 MIL/uL (ref 3.87–5.11)
RDW: 14.6 % (ref 11.5–15.5)
WBC: 5.3 10*3/uL (ref 4.0–10.5)
nRBC: 0 % (ref 0.0–0.2)

## 2019-05-25 LAB — COMPREHENSIVE METABOLIC PANEL
ALT: 29 U/L (ref 0–44)
AST: 27 U/L (ref 15–41)
Albumin: 4.2 g/dL (ref 3.5–5.0)
Alkaline Phosphatase: 44 U/L (ref 38–126)
Anion gap: 13 (ref 5–15)
BUN: 17 mg/dL (ref 8–23)
CO2: 27 mmol/L (ref 22–32)
Calcium: 10 mg/dL (ref 8.9–10.3)
Chloride: 97 mmol/L — ABNORMAL LOW (ref 98–111)
Creatinine, Ser: 0.58 mg/dL (ref 0.44–1.00)
GFR calc Af Amer: >60 mL/min (ref >60)
GFR calc non Af Amer: >60 mL/min (ref >60)
Glucose, Bld: 108 mg/dL — ABNORMAL HIGH (ref 70–99)
Potassium: 4.3 mmol/L (ref 3.5–5.1)
Sodium: 137 mmol/L (ref 135–145)
Total Bilirubin: 0.5 mg/dL (ref 0.3–1.2)
Total Protein: 7.4 g/dL (ref 6.5–8.1)

## 2019-05-26 LAB — PTH, INTACT AND CALCIUM
Calcium, Total (PTH): 10 mg/dL (ref 8.7–10.3)
PTH: 28 pg/mL (ref 15–65)

## 2019-06-01 ENCOUNTER — Other Ambulatory Visit: Payer: Self-pay

## 2019-06-01 ENCOUNTER — Inpatient Hospital Stay (HOSPITAL_COMMUNITY): Payer: Medicare Other | Admitting: Nurse Practitioner

## 2019-06-01 DIAGNOSIS — Z853 Personal history of malignant neoplasm of breast: Secondary | ICD-10-CM

## 2019-06-01 DIAGNOSIS — Z79899 Other long term (current) drug therapy: Secondary | ICD-10-CM | POA: Diagnosis not present

## 2019-06-01 DIAGNOSIS — Z9221 Personal history of antineoplastic chemotherapy: Secondary | ICD-10-CM | POA: Diagnosis not present

## 2019-06-01 DIAGNOSIS — Z171 Estrogen receptor negative status [ER-]: Secondary | ICD-10-CM | POA: Diagnosis not present

## 2019-06-01 DIAGNOSIS — Z9013 Acquired absence of bilateral breasts and nipples: Secondary | ICD-10-CM | POA: Diagnosis not present
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
## 2019-06-01 NOTE — Assessment & Plan Note (Addendum)
1.  Triple negative right breast cancer: -Diagnosed in September 2010, status post bilateral mastectomy with sentinel lymph node biopsy on 11/03/2008. -Status post adjuvant AC followed by T at [facility] done on 05/25/2019 were all WNL -Physical examination today did not reveal any suspicious masses at the bilateral mastectomy sites. -We will see her back in 1 year with repeat labs.  2.  Left breast DCIS: -Status post lumpectomy on 10/27/2006, ER/PR negative, margins less than 1 mm.  Status post MammoSite.  3.  Mild hypercalcemia: -She has mildly elevated calcium of 10.5 and 10.4.  Prior to that her calcium was normal. -We have reviewed her medications.  She is taking calcium and vitamin D.  She is also on hydrochlorothiazide.  Both of which can contribute to mild hypercalcemia.  Other differential is primary hyperparathyroidism. -We checked intact PTH on 05/25/2019 which showed 28.  Total calcium was 10.

## 2019-06-01 NOTE — Patient Instructions (Signed)
[facility] Discharge Instructions  Follow up in 1 year with labs    Thank you for choosing [facility] to provide your oncology and hematology care.  To afford each patient quality time with our provider, please arrive at least 15 minutes before your scheduled appointment time.   If you have a lab appointment with the Cancer Center please come in thru the Main Entrance and check in at the main information desk.  You need to re-schedule your appointment should you arrive 10 or more minutes late.  We strive to give you quality time with our providers, and arriving late affects you and other patients whose appointments are after yours.  Also, if you no show three or more times for appointments you may be dismissed from the clinic at the providers discretion.     Again, thank you for choosing [facility].  Our hope is that these requests will decrease the amount of time that you wait before being seen by our physicians.       _____________________________________________________________  Should you have questions after your visit to [facility], please contact our office at [phone] between the hours of 8:00 a.m. and 4:30 p.m.  Voicemails left after 4:00 p.m. will not be returned until the following business day.  For prescription refill requests, have your pharmacy contact our office and allow 72 hours.    Due to Covid, you will need to wear a mask upon entering the hospital. If you do not have a mask, a mask will be given to you at the Main Entrance upon arrival. For doctor visits, patients may have 1 support person with them. For treatment visits, patients can not have anyone with them due to social distancing guidelines and our immunocompromised population.      

## 2019-06-10 ENCOUNTER — Other Ambulatory Visit: Payer: Self-pay | Admitting: Family Medicine

## 2019-06-14 ENCOUNTER — Other Ambulatory Visit: Payer: Self-pay | Admitting: Family Medicine

## 2019-06-15 DIAGNOSIS — M79671 Pain in right foot: Secondary | ICD-10-CM | POA: Diagnosis not present

## 2019-06-15 DIAGNOSIS — I739 Peripheral vascular disease, unspecified: Secondary | ICD-10-CM | POA: Diagnosis not present

## 2019-06-15 DIAGNOSIS — E1151 Type 2 diabetes mellitus with diabetic peripheral angiopathy without gangrene: Secondary | ICD-10-CM | POA: Diagnosis not present

## 2019-06-15 DIAGNOSIS — E1140 Type 2 diabetes mellitus with diabetic neuropathy, unspecified: Secondary | ICD-10-CM | POA: Diagnosis not present

## 2019-06-15 DIAGNOSIS — L110 Acquired keratosis follicularis: Secondary | ICD-10-CM | POA: Diagnosis not present

## 2019-07-03 ENCOUNTER — Ambulatory Visit
Admission: EM | Admit: 2019-07-03 | Discharge: 2019-07-03 | Disposition: A | Discharge status: Home / Self Care | Payer: Medicare Other

## 2019-07-03 ENCOUNTER — Other Ambulatory Visit: Payer: Self-pay

## 2019-07-03 DIAGNOSIS — M25561 Pain in right knee: Secondary | ICD-10-CM
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
## 2019-07-03 NOTE — Discharge Instructions (Signed)
Symptoms most likely secondary to arthritis Continue conservative management of rest, ice, and gentle stretches Continue to alternate ibuprofen and tylenol as needed for pain Follow up with PCP or knee specialist if symptoms persist Return or go to the ER if you have any new or worsening symptoms (fever, chills, chest pain, redness, swelling, bruising, deformity, etc...)

## 2019-07-03 NOTE — ED Triage Notes (Signed)
Pt presents with right knee pain that began yesterday, denies injury , took alieve at 11:00

## 2019-07-08 ENCOUNTER — Other Ambulatory Visit: Payer: Self-pay

## 2019-07-08 ENCOUNTER — Encounter: Payer: Self-pay | Admitting: Family Medicine

## 2019-07-08 ENCOUNTER — Ambulatory Visit (INDEPENDENT_AMBULATORY_CARE_PROVIDER_SITE_OTHER): Payer: Medicare Other | Admitting: Family Medicine

## 2019-07-08 ENCOUNTER — Other Ambulatory Visit (HOSPITAL_COMMUNITY)
Admission: RE | Admit: 2019-07-08 | Discharge: 2019-07-08 | Disposition: A | Discharge status: Home / Self Care | Payer: Medicare Other | Source: Other Acute Inpatient Hospital | Attending: Family Medicine | Admitting: Family Medicine

## 2019-07-08 VITALS — BP 132/73 | HR 84 | Resp 16 | Ht 63.0 in | Wt 185.0 lb

## 2019-07-08 DIAGNOSIS — M25561 Pain in right knee: Secondary | ICD-10-CM | POA: Insufficient documentation

## 2019-07-08 DIAGNOSIS — R2681 Unsteadiness on feet: Secondary | ICD-10-CM

## 2019-07-08 DIAGNOSIS — E669 Obesity, unspecified: Secondary | ICD-10-CM

## 2019-07-08 DIAGNOSIS — E042 Nontoxic multinodular goiter: Secondary | ICD-10-CM

## 2019-07-08 DIAGNOSIS — I10 Essential (primary) hypertension: Secondary | ICD-10-CM

## 2019-07-08 DIAGNOSIS — R7989 Other specified abnormal findings of blood chemistry: Secondary | ICD-10-CM

## 2019-07-08 DIAGNOSIS — E1140 Type 2 diabetes mellitus with diabetic neuropathy, unspecified: Secondary | ICD-10-CM

## 2019-07-08 NOTE — Patient Instructions (Signed)
Keep August appointment with MD, please change to annual physical, call if you need me sooner  You are referred to Dr Aline Brochure urgently   Microalb from office today  I am repeating the [place] of your thyroid gland and referring you to Dr Dorris Fetch for evaluation of your throid gland  Please use your cane and be careful not to fall  Thanks for choosing [facility], we consider it a privelige to serve you.  Foot exam today qualifies you for your diabetic shoes  Thanks for choosing [facility], we consider it a privelige to serve you.

## 2019-07-09 ENCOUNTER — Other Ambulatory Visit: Payer: Self-pay

## 2019-07-09 ENCOUNTER — Encounter: Payer: Self-pay | Admitting: Orthopedic Surgery

## 2019-07-09 ENCOUNTER — Ambulatory Visit: Payer: Medicare Other | Admitting: Orthopedic Surgery

## 2019-07-09 ENCOUNTER — Ambulatory Visit: Payer: Medicare Other

## 2019-07-09 ENCOUNTER — Encounter: Payer: Self-pay | Admitting: Family Medicine

## 2019-07-09 VITALS — BP 149/80 | HR 92 | Ht 63.0 in | Wt 178.0 lb

## 2019-07-09 DIAGNOSIS — E049 Nontoxic goiter, unspecified: Secondary | ICD-10-CM | POA: Insufficient documentation

## 2019-07-09 DIAGNOSIS — M1711 Unilateral primary osteoarthritis, right knee: Secondary | ICD-10-CM | POA: Diagnosis not present

## 2019-07-09 DIAGNOSIS — M23321 Other meniscus derangements, posterior horn of medial meniscus, right knee: Secondary | ICD-10-CM

## 2019-07-09 DIAGNOSIS — M25561 Pain in right knee: Secondary | ICD-10-CM

## 2019-07-09 DIAGNOSIS — R7989 Other specified abnormal findings of blood chemistry: Secondary | ICD-10-CM | POA: Insufficient documentation

## 2019-07-09 DIAGNOSIS — E0510 Thyrotoxicosis with toxic single thyroid nodule without thyrotoxic crisis or storm: Secondary | ICD-10-CM | POA: Insufficient documentation

## 2019-07-09 LAB — MICROALBUMIN / CREATININE URINE RATIO
Creatinine, Urine: 53.9 mg/dL
Microalb Creat Ratio: <6 mg/g creat (ref 0–29)
Microalb, Ur: <3 ug/mL — ABNORMAL HIGH

## 2019-07-09 MED ORDER — UNABLE TO FIND: 0 refills | Status: DC

## 2019-07-09 NOTE — Progress Notes (Signed)
Lisa Mann     MRN: [id number]      DOB: 15-Jan-1933   HPI Lisa Mann is here with a 1 week h/o acute right knee pain, knee locked, she could barely move, no specific trauma to explain. Seen in UC and is here for f/u no better, and needing a cane Denies polyuria, polydipsia, blurred vision , or hypoglycemic episodes. Review of recent labs shows abn tSH and has had thyroid nodules, will f/u on this Needs diabetic shoes  ROS Denies recent fever or chills. Denies sinus pressure, nasal congestion, ear pain or sore throat. Denies chest congestion, productive cough or wheezing. Denies chest pains, palpitations and leg swelling Denies abdominal pain, nausea, vomiting,diarrhea or constipation.   Denies dysuria, frequency, hesitancy or incontinence.  Denies headaches, seizures, numbness, or tingling. Denies depression, anxiety or insomnia. Denies skin break down or rash.   PE  BP 132/73   Pulse 84   Resp 16   Ht 5\' 3"  (1.6 m)   Wt 185 lb (83.9 kg)   SpO2 97%   BMI 32.77 kg/m   Patient alert and oriented and in no cardiopulmonary distress.  HEENT: No facial asymmetry, EOMI,     Neck supple . Thyromegaly with nodules Chest: Clear to auscultation bilaterally.  CVS: S1, S2 no murmurs, no S3.Regular rate.  ABD: Soft non tender.   Ext: No edema  MS: Adequate though reduced  ROM spine, shoulders, hips and markedly reduced ROM right knee.  Skin: Intact, no ulcerations or rash noted.  Psych: Good eye contact, normal affect. Memory intact not anxious or depressed appearing.  CNS: CN 2-12 intact, power,  normal throughout.no focal deficits noted.   Assessment & Plan  Right knee pain Acute onset, no trauma, marked limitation in mobility Ortho eval asap  Unsteady gait Fall risk reduction and use of cane discussed  HTN, goal below 130/80 Controlled, no change in medication   Obesity (BMI 30.0-34.9)  Patient re-educated about  the importance of commitment to a  minimum  of 150 minutes of exercise per week as able.  The importance of healthy food choices with portion control discussed, as well as eating regularly and within a 12 hour window most days. The need to choose "clean , green" food 50 to 75% of the time is discussed, as well as to make water the primary drink and set a goal of 64 ounces water daily.    Weight /BMI 07/08/2019 06/01/2019 04/20/2019  WEIGHT 185 lb 184 lb 3.2 oz 181 lb 1.9 oz  HEIGHT 5\' 3"  - 5\' 3"   BMI 32.77 kg/m2 32.63 kg/m2 32.08 kg/m2      Diabetes mellitus with neuropathy Lisa Mann is reminded of the importance of commitment to daily physical activity for 30 minutes or more, as able and the need to limit carbohydrate intake to 30 to 60 grams per meal to help with blood sugar control.   The need to take medication as prescribed, test blood sugar as directed, and to call between visits if there is a concern that blood sugar is uncontrolled is also discussed.   Lisa Mann is reminded of the importance of daily foot exam, annual eye examination, and good blood sugar, blood pressure and cholesterol control.  Diabetic Labs Latest Ref Rng & Units 05/25/2019 04/28/2019 08/24/2018 07/07/2018 05/22/2018  HbA1c <5.7 % of total Hgb - 6.8(H) 6.9(H) 6.9(H) -  Microalbumin mg/dL - - - - -  Micro/Creat Ratio <30 mcg/mg creat - - - - -  Chol <200 mg/dL - 167 172 170 -  HDL > OR = 50 mg/dL - 52 47(L) 52 -  Calc LDL mg/dL (calc) - 94 103(H) 99 -  Triglycerides <150 mg/dL - 110 121 97 -  Creatinine 0.44 - 1.00 mg/dL 0.58 0.70 0.64 0.67 0.75   BP/Weight 07/08/2019 07/03/2019 06/01/2019 04/20/2019 12/16/2018 84/07/2070 01/14/2881  Systolic BP 374 451 460 479 987 215 872  Diastolic BP 73 67 63 82 68 74 61  Wt. (Lbs) 185 - 184.2 181.12 183.04 182 188  BMI 32.77 - 32.63 32.08 32.42 32.24 33.3   Foot/eye exam completion dates Latest Ref Rng & Units 07/08/2019 04/20/2019  Eye Exam No Retinopathy - -  Foot exam Order - - -  Foot Form Completion - Done Done         Abnormal TSH Refer to Endo for evaland rept [place] a she has multiple nodules  Multiple thyroid nodules rept [place] has abn TSH, Endo referral

## 2019-07-09 NOTE — Assessment & Plan Note (Signed)
Refer to Endo for evaland rept [place] a she has multiple nodules

## 2019-07-09 NOTE — Assessment & Plan Note (Signed)
Controlled, no change in medication  

## 2019-07-09 NOTE — Assessment & Plan Note (Signed)
Lisa Mann is reminded of the importance of commitment to daily physical activity for 30 minutes or more, as able and the need to limit carbohydrate intake to 30 to 60 grams per meal to help with blood sugar control.   The need to take medication as prescribed, test blood sugar as directed, and to call between visits if there is a concern that blood sugar is uncontrolled is also discussed.   Lisa Mann is reminded of the importance of daily foot exam, annual eye examination, and good blood sugar, blood pressure and cholesterol control.  Diabetic Labs Latest Ref Rng & Units 05/25/2019 04/28/2019 08/24/2018 07/07/2018 05/22/2018  HbA1c <5.7 % of total Hgb - 6.8(H) 6.9(H) 6.9(H) -  Microalbumin mg/dL - - - - -  Micro/Creat Ratio <30 mcg/mg creat - - - - -  Chol <200 mg/dL - 167 172 170 -  HDL > OR = 50 mg/dL - 52 47(L) 52 -  Calc LDL mg/dL (calc) - 94 103(H) 99 -  Triglycerides <150 mg/dL - 110 121 97 -  Creatinine 0.44 - 1.00 mg/dL 0.58 0.70 0.64 0.67 0.75   BP/Weight 07/08/2019 07/03/2019 06/01/2019 04/20/2019 12/16/2018 15/03/7941 02/13/6145  Systolic BP 092 957 473 403 709 643 838  Diastolic BP 73 67 63 82 68 74 61  Wt. (Lbs) 185 - 184.2 181.12 183.04 182 188  BMI 32.77 - 32.63 32.08 32.42 32.24 33.3   Foot/eye exam completion dates Latest Ref Rng & Units 07/08/2019 04/20/2019  Eye Exam No Retinopathy - -  Foot exam Order - - -  Foot Form Completion - Done Done

## 2019-07-09 NOTE — Assessment & Plan Note (Signed)
Acute onset, no trauma, marked limitation in mobility Ortho eval asap

## 2019-07-09 NOTE — Assessment & Plan Note (Signed)
rept [place] has abn TSH, Endo referral
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
## 2019-07-09 NOTE — Patient Instructions (Addendum)
ALEVE TWICE A DAY   ICE TWICE A DAY   CANE NEXT 4 WEEKS  BRACE    Meniscus Tear  A meniscus tear is a knee injury that happens when a piece of the meniscus is torn. The meniscus is a thick, rubbery, wedge-shaped cartilage in the knee. Two menisci are located in each knee. They sit between the upper bone (femur) and lower bone (tibia) that make up the knee joint. Each meniscus acts as a shock absorber for the knee. A torn meniscus is one of the most common types of knee injuries. This injury can range from mild to severe. Surgery may be needed to repair a severe tear. What are the causes? This condition may be caused by any kneeling, squatting, twisting, or pivoting movement. Sports-related injuries are the most common cause. These often occur from:  Running and stopping suddenly. ? Changing direction. ? Being tackled or knocked off your feet.  Lifting or carrying heavy weights. As people get older, their menisci get thinner and weaker. In these people, tears can happen more easily, such as from climbing stairs. What increases the risk? You are more likely to develop this condition if you:  Play contact sports.  Have a job that requires kneeling or squatting.  Are female.  Are over 55 years old. What are the signs or symptoms? Symptoms of this condition include:  Knee pain, especially at the side of the knee joint. You may feel pain when the injury occurs, or you may only hear a pop and feel pain later.  A feeling that your knee is clicking, catching, locking, or giving way.  Not being able to fully bend or extend your knee.  Bruising or swelling in your knee. How is this diagnosed? This condition may be diagnosed based on your symptoms and a physical exam. You may also have tests, such as:  X-rays.  MRI.  A procedure to look inside your knee with a narrow surgical telescope (arthroscopy). You may be referred to a knee specialist (orthopedic surgeon). How is this  treated? Treatment for this injury depends on the severity of the tear. Treatment for a mild tear may include:  Rest.  Medicine to reduce pain and swelling. This is usually a nonsteroidal anti-inflammatory drug (NSAID), like ibuprofen.  A knee brace, sleeve, or wrap.  Using crutches or a walker to keep weight off your knee and to help you walk.  Exercises to strengthen your knee (physical therapy). You may need surgery if you have a severe tear or if other treatments are not working. Follow these instructions at home: If you have a brace, sleeve, or wrap:  Wear it as told by your health care provider. Remove it only as told by your health care provider.  Loosen the brace, sleeve, or wrap if your toes tingle, become numb, or turn cold and blue.  Keep the brace, sleeve, or wrap clean and dry.  If the brace, sleeve, or wrap is not waterproof: ? Do not let it get wet. ? Cover it with a watertight covering when you take a bath or shower. Managing pain and swelling   Take over-the-counter and prescription medicines only as told by your health care provider.  If directed, put ice on your knee: ? If you have a removable brace, sleeve, or wrap, remove it as told by your health care provider. ? Put ice in a plastic bag. ? Place a towel between your skin and the bag. ? Leave the ice on  for 20 minutes, 2-3 times per day.  Move your toes often to avoid stiffness and to lessen swelling.  Raise (elevate) the injured area above the level of your heart while you are sitting or lying down. Activity  Do not use the injured limb to support your body weight until your health care provider says that you can. Use crutches or a walker as told by your health care provider.  Return to your normal activities as told by your health care provider. Ask your health care provider what activities are safe for you.  Perform range-of-motion exercises only as told by your health care provider.  Begin  doing exercises to strengthen your knee and leg muscles only as told by your health care provider. After you recover, your health care provider may recommend these exercises to help prevent another injury. General instructions  Use a knee brace, sleeve, or wrap as told by your health care provider.  Ask your health care provider when it is safe to drive if you have a brace, sleeve, or wrap on your knee.  Do not use any products that contain nicotine or tobacco, such as cigarettes, e-cigarettes, and chewing tobacco. If you need help quitting, ask your health care provider.  Ask your health care provider if the medicine prescribed to you: ? Requires you to avoid driving or using heavy machinery. ? Can cause constipation. You may need to take these actions to prevent or treat constipation:  Drink enough fluid to keep your urine pale yellow.  Take over-the-counter or prescription medicines.  Eat foods that are high in fiber, such as beans, whole grains, and fresh fruits and vegetables.  Limit foods that are high in fat and processed sugars, such as fried or sweet foods.  Keep all follow-up visits as told by your health care provider. This is important. Contact a health care provider if:  You have a fever.  Your knee becomes red, tender, or swollen.  Your pain medicine is not helping.  Your symptoms get worse or do not improve after 2 weeks of home care. Summary  A meniscus tear is a knee injury that happens when a piece of the meniscus is torn.  Treatment for this injury depends on the severity of the tear. You may need surgery if you have a severe tear or if other treatments are not working.  Rest, ice, and raise (elevate) your injured knee as told by your health care provider. This will help lessen pain and swelling.  Contact a health care provider if you have new symptoms, or your symptoms get worse or do not improve after 2 weeks of home care.  Keep all follow-up visits as  told by your health care provider. This is important. This information is not intended to replace advice given to you by your health care provider. Make sure you discuss any questions you have with your health care provider. Document Revised: 07/08/2017 Document Reviewed: 07/08/2017 Elsevier Patient Education  [employer].

## 2019-07-09 NOTE — Assessment & Plan Note (Signed)
Fall risk reduction and use of cane discussed

## 2019-07-09 NOTE — Assessment & Plan Note (Signed)
  Patient re-educated about  the importance of commitment to a  minimum of 150 minutes of exercise per week as able.  The importance of healthy food choices with portion control discussed, as well as eating regularly and within a 12 hour window most days. The need to choose "clean , green" food 50 to 75% of the time is discussed, as well as to make water the primary drink and set a goal of 64 ounces water daily.    Weight /BMI 07/08/2019 06/01/2019 04/20/2019  WEIGHT 185 lb 184 lb 3.2 oz 181 lb 1.9 oz  HEIGHT 5\' 3"  - 5\' 3"   BMI 32.77 kg/m2 32.63 kg/m2 32.08 kg/m2

## 2019-07-15 ENCOUNTER — Other Ambulatory Visit: Payer: Self-pay

## 2019-07-15 ENCOUNTER — Ambulatory Visit (HOSPITAL_COMMUNITY)
Admission: RE | Admit: 2019-07-15 | Discharge: 2019-07-15 | Disposition: A | Discharge status: Home / Self Care | Payer: Medicare Other | Source: Ambulatory Visit | Attending: Family Medicine | Admitting: Family Medicine

## 2019-07-15 DIAGNOSIS — E042 Nontoxic multinodular goiter: Secondary | ICD-10-CM | POA: Insufficient documentation

## 2019-07-15 DIAGNOSIS — R7989 Other specified abnormal findings of blood chemistry: Secondary | ICD-10-CM | POA: Insufficient documentation

## 2019-07-28 DIAGNOSIS — M21532 Acquired clawfoot, left foot: Secondary | ICD-10-CM | POA: Diagnosis not present

## 2019-07-28 DIAGNOSIS — M2141 Flat foot [pes planus] (acquired), right foot: Secondary | ICD-10-CM | POA: Diagnosis not present

## 2019-07-28 DIAGNOSIS — M2142 Flat foot [pes planus] (acquired), left foot: Secondary | ICD-10-CM | POA: Diagnosis not present

## 2019-07-28 DIAGNOSIS — E1140 Type 2 diabetes mellitus with diabetic neuropathy, unspecified: Secondary | ICD-10-CM | POA: Diagnosis not present

## 2019-08-06 ENCOUNTER — Other Ambulatory Visit: Payer: Self-pay

## 2019-08-06 ENCOUNTER — Ambulatory Visit: Payer: Medicare Other | Admitting: Orthopedic Surgery

## 2019-08-06 VITALS — Ht 63.0 in | Wt 178.0 lb

## 2019-08-06 DIAGNOSIS — M25561 Pain in right knee: Secondary | ICD-10-CM | POA: Diagnosis not present

## 2019-08-06 NOTE — Progress Notes (Signed)
Lisa Mann  08/06/2019  Body mass index is 31.53 kg/m.   S:  Chief Complaint  Patient presents with  . Follow-up    4 week recheck on right knee.    Lisa Mann had acute pain in her right knee with locking and popping with a combination of Aleve Tylenol rest ice and a cane she says her knee is much improved   O: Ht 5\' 3"  (1.6 m)   Wt 178 lb (80.7 kg)   BMI 31.53 kg/m   Physical Exam  Exam shows a stable knee no effusion normal flexion extension without pain  MEDICAL DECISION MAKING  Encounter Diagnosis  Name Primary?  . Acute pain of right knee Yes    MANAGEMENT   Recommend that she use ice for swelling continue the use of her cane gradually return to normal activities  No orders of the defined types were placed in this encounter.     Arther Abbott, MD  08/06/2019 10:10 AM

## 2019-08-17 ENCOUNTER — Telehealth: Payer: Medicare Other | Admitting: Family Medicine

## 2019-08-24 ENCOUNTER — Other Ambulatory Visit: Payer: Self-pay

## 2019-08-24 ENCOUNTER — Encounter: Payer: Self-pay | Admitting: Family Medicine

## 2019-08-24 ENCOUNTER — Ambulatory Visit (INDEPENDENT_AMBULATORY_CARE_PROVIDER_SITE_OTHER): Payer: Medicare Other | Admitting: Family Medicine

## 2019-08-24 VITALS — BP 134/75 | HR 86 | Resp 16 | Ht 63.0 in | Wt 183.0 lb

## 2019-08-24 DIAGNOSIS — H547 Unspecified visual loss: Secondary | ICD-10-CM

## 2019-08-24 DIAGNOSIS — E1140 Type 2 diabetes mellitus with diabetic neuropathy, unspecified: Secondary | ICD-10-CM

## 2019-08-24 DIAGNOSIS — Z780 Asymptomatic menopausal state: Secondary | ICD-10-CM | POA: Diagnosis not present

## 2019-08-24 DIAGNOSIS — Z0000 Encounter for general adult medical examination without abnormal findings: Secondary | ICD-10-CM

## 2019-08-24 DIAGNOSIS — E785 Hyperlipidemia, unspecified: Secondary | ICD-10-CM

## 2019-08-24 LAB — POCT GLYCOSYLATED HEMOGLOBIN (HGB A1C)
HbA1c POC (<> result, manual entry): 6.6 % (ref 4.0–5.6)
HbA1c, POC (controlled diabetic range): 6.6 % (ref 0.0–7.0)
HbA1c, POC (prediabetic range): 6.6 % — AB (ref 5.7–6.4)
Hemoglobin A1C: 6.6 % — AB (ref 4.0–5.6)

## 2019-08-24 MED ORDER — CALCIUM CARBONATE-VITAMIN D 500-400 MG-UNIT PO TABS: 1.0000 | ORAL_TABLET | Freq: Two times a day (BID) | ORAL | 11 refills | Status: DC

## 2019-08-24 NOTE — Patient Instructions (Signed)
F/U in office end January, call if you need me before  Please get fasting lipid, cmp and eGFR, mid October glycoHB today Vision is not good , I recommend re evaluation  Start calcium with D 500 mg/ 400 IU one twice daily  It is important that you exercise regularly at least 30 minutes 5 times a week. If you develop chest pain, have severe difficulty breathing, or feel very tired, stop exercising immediately and seek medical attention  Think about what you will eat, plan ahead. Choose " clean, green, fresh or frozen" over canned, processed or packaged foods which are more sugary, salty and fatty. 70 to 75% of food eaten should be vegetables and fruit. Three meals at set times with snacks allowed between meals, but they must be fruit or vegetables. Aim to eat over a 12 hour period , example 7 am to 7 pm, and STOP after  your last meal of the day. Drink water,generally about 64 ounces per day, no other drink is as healthy. Fruit juice is best enjoyed in a healthy way, by EATING the fruit. Thanks for choosing [facility], we consider it a privelige to serve you.

## 2019-08-24 NOTE — Assessment & Plan Note (Signed)
Screening with correction shows reduced vision, refer for eye exam

## 2019-08-24 NOTE — Assessment & Plan Note (Signed)

## 2019-08-24 NOTE — Progress Notes (Signed)
Lisa Mann     MRN: [id number]      DOB: 27-Jun-1933  HPI: Patient is in for annual physical exam. No other health concerns are expressed or addressed at the visit. Recent labs, if available are reviewed. Immunization is reviewed , and  updated if needed.   PE: BP 134/75   Pulse 86   Resp 16   Ht 5\' 3"  (1.6 m)   Wt 183 lb (83 kg)   SpO2 96%   BMI 32.42 kg/m   Pleasant  female, alert and oriented x 3, in no cardio-pulmonary distress. Afebrile. HEENT No facial trauma or asymetry. Sinuses non tender.  Extra occullar muscles intact.. External ears normal, . Neck: supple, no adenopathy,JVD or thyromegaly.No bruits.  Chest: Clear to ascultation bilaterally.No crackles or wheezes. Non tender to palpation  Breast: Bilateral mastectomy  Cardiovascular system; Heart sounds normal,  S1 and  S2 ,no S3.  No murmur, or thrill. Apical beat not displaced Peripheral pulses normal.  Abdomen: Soft, non tender, No guarding, tenderness or rebound.    Musculoskeletal exam: Decreased though  ROM of spine, hips , shoulders and knees.  deformity ,swelling and  crepitus noted. No muscle wasting or atrophy.   Neurologic: Cranial nerves 2 to 12 intact. Power, tone ,sensation and reflexes normal throughout.  disturbance in gait. No tremor.  Skin: Intact, no ulceration, erythema , scaling or rash noted. Pigmentation normal throughout  Psych; Normal mood and affect. Judgement and concentration normal   Assessment & Plan:  Annual physical exam Annual exam as documented. Counseling done  re healthy lifestyle involving commitment to 150 minutes exercise per week, heart healthy diet, and attaining healthy weight.The importance of adequate sleep also discussed. Regular seat belt use and home safety, is also discussed. Changes in health habits are decided on by the patient with goals and time frames  set for achieving them. Immunization and cancer screening needs are specifically  addressed at this visit.   Reduced vision Screening with correction shows reduced vision, refer for eye exam  Diabetes mellitus with neuropathy Controlled, no change in medication Ms. Dominey is reminded of the importance of commitment to daily physical activity for 30 minutes or more, as able and the need to limit carbohydrate intake to 30 to 60 grams per meal to help with blood sugar control.   The need to take medication as prescribed, test blood sugar as directed, and to call between visits if there is a concern that blood sugar is uncontrolled is also discussed.   Ms. Yebra is reminded of the importance of daily foot exam, annual eye examination, and good blood sugar, blood pressure and cholesterol control.  Diabetic Labs Latest Ref Rng & Units 08/24/2019 08/24/2019 08/24/2019 08/24/2019 07/08/2019  HbA1c 4.0 - 5.6 % 6.6(A) 6.6 6.6 6.6(A) -  Microalbumin Not Estab. ug/mL - - - - <3.0(H)  Micro/Creat Ratio 0 - 29 mg/g creat - - - - <6  Chol <200 mg/dL - - - - -  HDL > OR = 50 mg/dL - - - - -  Calc LDL mg/dL (calc) - - - - -  Triglycerides <150 mg/dL - - - - -  Creatinine 0.44 - 1.00 mg/dL - - - - -   BP/Weight 08/24/2019 08/06/2019 07/09/2019 07/08/2019 07/03/2019 06/01/2019 0/94/7096  Systolic BP 283 - 662 947 654 650 354  Diastolic BP 75 - 80 73 67 63 82  Wt. (Lbs) 183 178 178 185 - 184.2 181.12  BMI 32.42 31.53  31.53 32.77 - 32.63 32.08   Foot/eye exam completion dates Latest Ref Rng & Units 07/08/2019 04/20/2019  Eye Exam No Retinopathy - -  Foot exam Order - - -  Foot Form Completion - Done Done

## 2019-08-24 NOTE — Assessment & Plan Note (Signed)
Controlled, no change in medication Lisa Mann is reminded of the importance of commitment to daily physical activity for 30 minutes or more, as able and the need to limit carbohydrate intake to 30 to 60 grams per meal to help with blood sugar control.   The need to take medication as prescribed, test blood sugar as directed, and to call between visits if there is a concern that blood sugar is uncontrolled is also discussed.   Lisa Mann is reminded of the importance of daily foot exam, annual eye examination, and good blood sugar, blood pressure and cholesterol control.  Diabetic Labs Latest Ref Rng & Units 08/24/2019 08/24/2019 08/24/2019 08/24/2019 07/08/2019  HbA1c 4.0 - 5.6 % 6.6(A) 6.6 6.6 6.6(A) -  Microalbumin Not Estab. ug/mL - - - - <3.0(H)  Micro/Creat Ratio 0 - 29 mg/g creat - - - - <6  Chol <200 mg/dL - - - - -  HDL > OR = 50 mg/dL - - - - -  Calc LDL mg/dL (calc) - - - - -  Triglycerides <150 mg/dL - - - - -  Creatinine 0.44 - 1.00 mg/dL - - - - -   BP/Weight 08/24/2019 08/06/2019 07/09/2019 07/08/2019 07/03/2019 06/01/2019 06/27/3084  Systolic BP 578 - 469 629 528 413 244  Diastolic BP 75 - 80 73 67 63 82  Wt. (Lbs) 183 178 178 185 - 184.2 181.12  BMI 32.42 31.53 31.53 32.77 - 32.63 32.08   Foot/eye exam completion dates Latest Ref Rng & Units 07/08/2019 04/20/2019  Eye Exam No Retinopathy - -  Foot exam Order - - -  Foot Form Completion - Done Done

## 2019-08-25 ENCOUNTER — Telehealth (INDEPENDENT_AMBULATORY_CARE_PROVIDER_SITE_OTHER): Payer: Medicare Other | Admitting: Family Medicine

## 2019-08-25 ENCOUNTER — Encounter: Payer: Self-pay | Admitting: Family Medicine

## 2019-08-25 VITALS — BP 134/75 | Ht 63.0 in | Wt 183.0 lb

## 2019-08-25 DIAGNOSIS — Z0000 Encounter for general adult medical examination without abnormal findings: Secondary | ICD-10-CM | POA: Diagnosis not present

## 2019-08-25 NOTE — Patient Instructions (Signed)
[name] , Thank you for taking time to come for your Medicare Wellness Visit. I appreciate your ongoing commitment to your health goals. Please review the following plan we discussed and let me know if I can assist you in the future.   Please continue to practice social distancing to keep you, your family, and our community safe.  If you must go out, please wear a Mask and practice good handwashing.  Screening recommendations/referrals: Colonoscopy: n.a Mammogram: n/a Bone Density: up to date Recommended yearly ophthalmology/optometry visit for glaucoma screening and checkup Recommended yearly dental visit for hygiene and checkup  Vaccinations: up to date; Office will call to get Flu Shot set up  Advanced directives: Declines  Conditions/risks identified: Falls  Next appointment: 01/24/2020    Preventive Care 69 Years and Older, Female Preventive care refers to lifestyle choices and visits with your health care provider that can promote health and wellness. What does preventive care include?  A yearly physical exam. This is also called an annual well check.  Dental exams once or twice a year.  Routine eye exams. Ask your health care provider how often you should have your eyes checked.  Personal lifestyle choices, including:  Daily care of your teeth and gums.  Regular physical activity.  Eating a healthy diet.  Avoiding tobacco and drug use.  Limiting alcohol use.  Practicing safe sex.  Taking low-dose aspirin every day.  Taking vitamin and mineral supplements as recommended by your health care provider. What happens during an annual well check? The services and screenings done by your health care provider during your annual well check will depend on your age, overall health, lifestyle risk factors, and family history of disease. Counseling  Your health care provider may ask you questions about your:  Alcohol use.  Tobacco use.  Drug use.  Emotional  well-being.  Home and relationship well-being.  Sexual activity.  Eating habits.  History of falls.  Memory and ability to understand (cognition).  Work and work Statistician.  Reproductive health. Screening  You may have the following tests or measurements:  Height, weight, and BMI.  Blood pressure.  Lipid and cholesterol levels. These may be checked every 5 years, or more frequently if you are over 84 years old.  Skin check.  Lung cancer screening. You may have this screening every year starting at age 82 if you have a 30-pack-year history of smoking and currently smoke or have quit within the past 15 years.  Fecal occult blood test (FOBT) of the stool. You may have this test every year starting at age 42.  Flexible sigmoidoscopy or colonoscopy. You may have a sigmoidoscopy every 5 years or a colonoscopy every 10 years starting at age 21.  Hepatitis C blood test.  Hepatitis B blood test.  Sexually transmitted disease (STD) testing.  Diabetes screening. This is done by checking your blood sugar (glucose) after you have not eaten for a while (fasting). You may have this done every 1-3 years.  Bone density scan. This is done to screen for osteoporosis. You may have this done starting at age 46.  Mammogram. This may be done every 1-2 years. Talk to your health care provider about how often you should have regular mammograms. Talk with your health care provider about your test results, treatment options, and if necessary, the need for more tests. Vaccines  Your health care provider may recommend certain vaccines, such as:  Influenza vaccine. This is recommended every year.  Tetanus, diphtheria, and  acellular pertussis (Tdap, Td) vaccine. You may need a Td booster every 10 years.  Zoster vaccine. You may need this after age 39.  Pneumococcal 13-valent conjugate (PCV13) vaccine. One dose is recommended after age 19.  Pneumococcal polysaccharide (PPSV23) vaccine. One  dose is recommended after age 61. Talk to your health care provider about which screenings and vaccines you need and how often you need them. This information is not intended to replace advice given to you by your health care provider. Make sure you discuss any questions you have with your health care provider. Document Released: 01/20/2015 Document Revised: 09/13/2015 Document Reviewed: 10/25/2014 Elsevier Interactive Patient Education  2017 [employer] Prevention in the Home Falls can cause injuries. They can happen to people of all ages. There are many things you can do to make your home safe and to help prevent falls. What can I do on the outside of my home?  Regularly fix the edges of walkways and driveways and fix any cracks.  Remove anything that might make you trip as you walk through a door, such as a raised step or threshold.  Trim any bushes or trees on the path to your home.  Use bright outdoor lighting.  Clear any walking paths of anything that might make someone trip, such as rocks or tools.  Regularly check to see if handrails are loose or broken. Make sure that both sides of any steps have handrails.  Any raised decks and porches should have guardrails on the edges.  Have any leaves, snow, or ice cleared regularly.  Use sand or salt on walking paths during winter.  Clean up any spills in your garage right away. This includes oil or grease spills. What can I do in the bathroom?  Use night lights.  Install grab bars by the toilet and in the tub and shower. Do not use towel bars as grab bars.  Use non-skid mats or decals in the tub or shower.  If you need to sit down in the shower, use a plastic, non-slip stool.  Keep the floor dry. Clean up any water that spills on the floor as soon as it happens.  Remove soap buildup in the tub or shower regularly.  Attach bath mats securely with double-sided non-slip rug tape.  Do not have throw rugs and other  things on the floor that can make you trip. What can I do in the bedroom?  Use night lights.  Make sure that you have a light by your bed that is easy to reach.  Do not use any sheets or blankets that are too big for your bed. They should not hang down onto the floor.  Have a firm chair that has side arms. You can use this for support while you get dressed.  Do not have throw rugs and other things on the floor that can make you trip. What can I do in the kitchen?  Clean up any spills right away.  Avoid walking on wet floors.  Keep items that you use a lot in easy-to-reach places.  If you need to reach something above you, use a strong step stool that has a grab bar.  Keep electrical cords out of the way.  Do not use floor polish or wax that makes floors slippery. If you must use wax, use non-skid floor wax.  Do not have throw rugs and other things on the floor that can make you trip. What can I do with my stairs?  Do not leave any items on the stairs.  Make sure that there are handrails on both sides of the stairs and use them. Fix handrails that are broken or loose. Make sure that handrails are as long as the stairways.  Check any carpeting to make sure that it is firmly attached to the stairs. Fix any carpet that is loose or worn.  Avoid having throw rugs at the top or bottom of the stairs. If you do have throw rugs, attach them to the floor with carpet tape.  Make sure that you have a light switch at the top of the stairs and the bottom of the stairs. If you do not have them, ask someone to add them for you. What else can I do to help prevent falls?  Wear shoes that:  Do not have high heels.  Have rubber bottoms.  Are comfortable and fit you well.  Are closed at the toe. Do not wear sandals.  If you use a stepladder:  Make sure that it is fully opened. Do not climb a closed stepladder.  Make sure that both sides of the stepladder are locked into place.  Ask  someone to hold it for you, if possible.  Clearly mark and make sure that you can see:  Any grab bars or handrails.  First and last steps.  Where the edge of each step is.  Use tools that help you move around (mobility aids) if they are needed. These include:  Canes.  Walkers.  Scooters.  Crutches.  Turn on the lights when you go into a dark area. Replace any light bulbs as soon as they burn out.  Set up your furniture so you have a clear path. Avoid moving your furniture around.  If any of your floors are uneven, fix them.  If there are any pets around you, be aware of where they are.  Review your medicines with your doctor. Some medicines can make you feel dizzy. This can increase your chance of falling. Ask your doctor what other things that you can do to help prevent falls. This information is not intended to replace advice given to you by your health care provider. Make sure you discuss any questions you have with your health care provider. Document Released: 10/20/2008 Document Revised: 06/01/2015 Document Reviewed: 01/28/2014 Elsevier Interactive Patient Education  2017 [employer].
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
## 2019-09-07 ENCOUNTER — Encounter: Payer: Self-pay | Admitting: "Endocrinology

## 2019-09-07 ENCOUNTER — Ambulatory Visit (INDEPENDENT_AMBULATORY_CARE_PROVIDER_SITE_OTHER): Payer: Medicare Other | Admitting: "Endocrinology

## 2019-09-07 ENCOUNTER — Other Ambulatory Visit: Payer: Self-pay

## 2019-09-07 VITALS — BP 138/76 | HR 92 | Ht 63.0 in | Wt 181.8 lb

## 2019-09-07 DIAGNOSIS — M79671 Pain in right foot: Secondary | ICD-10-CM | POA: Diagnosis not present

## 2019-09-07 DIAGNOSIS — E049 Nontoxic goiter, unspecified: Secondary | ICD-10-CM | POA: Diagnosis not present

## 2019-09-07 DIAGNOSIS — E1140 Type 2 diabetes mellitus with diabetic neuropathy, unspecified: Secondary | ICD-10-CM | POA: Diagnosis not present

## 2019-09-07 DIAGNOSIS — L110 Acquired keratosis follicularis: Secondary | ICD-10-CM | POA: Diagnosis not present

## 2019-09-07 DIAGNOSIS — I739 Peripheral vascular disease, unspecified: Secondary | ICD-10-CM | POA: Diagnosis not present

## 2019-09-07 DIAGNOSIS — E1151 Type 2 diabetes mellitus with diabetic peripheral angiopathy without gangrene: Secondary | ICD-10-CM | POA: Diagnosis not present
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
## 2019-09-07 NOTE — Patient Instructions (Signed)
Hold Biotin for 3 weeks before your labs work.

## 2019-09-22 ENCOUNTER — Other Ambulatory Visit: Payer: Self-pay | Admitting: Family Medicine

## 2019-09-22 DIAGNOSIS — E1140 Type 2 diabetes mellitus with diabetic neuropathy, unspecified: Secondary | ICD-10-CM | POA: Diagnosis not present

## 2019-09-22 DIAGNOSIS — E785 Hyperlipidemia, unspecified: Secondary | ICD-10-CM | POA: Diagnosis not present

## 2019-09-22 LAB — COMPLETE METABOLIC PANEL WITH GFR
AG Ratio: 1.7 (calc) (ref 1.0–2.5)
ALT: 22 U/L (ref 6–29)
AST: 18 U/L (ref 10–35)
Albumin: 4.3 g/dL (ref 3.6–5.1)
Alkaline phosphatase (APISO): 41 U/L (ref 37–153)
BUN: 20 mg/dL (ref 7–25)
CO2: 32 mmol/L (ref 20–32)
Calcium: 9.6 mg/dL (ref 8.6–10.4)
Chloride: 100 mmol/L (ref 98–110)
Creat: 0.62 mg/dL (ref 0.60–0.88)
GFR, Est African American: 96 mL/min/{1.73_m2} (ref >=60)
GFR, Est Non African American: 83 mL/min/{1.73_m2} (ref >=60)
Globulin: 2.6 g/dL (calc) (ref 1.9–3.7)
Glucose, Bld: 126 mg/dL — ABNORMAL HIGH (ref 65–99)
Potassium: 4.4 mmol/L (ref 3.5–5.3)
Sodium: 138 mmol/L (ref 135–146)
Total Bilirubin: 0.3 mg/dL (ref 0.2–1.2)
Total Protein: 6.9 g/dL (ref 6.1–8.1)

## 2019-09-22 LAB — LIPID PANEL
Cholesterol: 153 mg/dL (ref <200)
HDL: 53 mg/dL (ref >=50)
LDL Cholesterol (Calc): 82 mg/dL (calc)
Non-HDL Cholesterol (Calc): 100 mg/dL (calc) (ref <130)
Total CHOL/HDL Ratio: 2.9 (calc) (ref <5.0)
Triglycerides: 89 mg/dL (ref <150)

## 2019-10-06 ENCOUNTER — Other Ambulatory Visit (HOSPITAL_COMMUNITY)
Admission: RE | Admit: 2019-10-06 | Discharge: 2019-10-06 | Disposition: A | Discharge status: Home / Self Care | Payer: Medicare Other | Source: Ambulatory Visit | Attending: "Endocrinology | Admitting: "Endocrinology

## 2019-10-06 ENCOUNTER — Other Ambulatory Visit: Payer: Self-pay

## 2019-10-06 ENCOUNTER — Telehealth: Payer: Medicare Other | Admitting: "Endocrinology

## 2019-10-06 DIAGNOSIS — E785 Hyperlipidemia, unspecified: Secondary | ICD-10-CM | POA: Diagnosis not present

## 2019-10-06 DIAGNOSIS — E049 Nontoxic goiter, unspecified: Secondary | ICD-10-CM | POA: Diagnosis not present

## 2019-10-06 DIAGNOSIS — E1140 Type 2 diabetes mellitus with diabetic neuropathy, unspecified: Secondary | ICD-10-CM | POA: Insufficient documentation

## 2019-10-06 LAB — COMPREHENSIVE METABOLIC PANEL
ALT: 25 U/L (ref 0–44)
AST: 23 U/L (ref 15–41)
Albumin: 4.1 g/dL (ref 3.5–5.0)
Alkaline Phosphatase: 38 U/L (ref 38–126)
Anion gap: 11 (ref 5–15)
BUN: 17 mg/dL (ref 8–23)
CO2: 29 mmol/L (ref 22–32)
Calcium: 9.5 mg/dL (ref 8.9–10.3)
Chloride: 96 mmol/L — ABNORMAL LOW (ref 98–111)
Creatinine, Ser: 0.64 mg/dL (ref 0.44–1.00)
GFR calc Af Amer: >60 mL/min (ref >60)
GFR calc non Af Amer: >60 mL/min (ref >60)
Glucose, Bld: 127 mg/dL — ABNORMAL HIGH (ref 70–99)
Potassium: 3.7 mmol/L (ref 3.5–5.1)
Sodium: 136 mmol/L (ref 135–145)
Total Bilirubin: 0.7 mg/dL (ref 0.3–1.2)
Total Protein: 7.4 g/dL (ref 6.5–8.1)

## 2019-10-06 LAB — LIPID PANEL
Cholesterol: 165 mg/dL (ref 0–200)
HDL: 54 mg/dL (ref >40)
LDL Cholesterol: 94 mg/dL (ref 0–99)
Total CHOL/HDL Ratio: 3.1 RATIO
Triglycerides: 83 mg/dL (ref <150)
VLDL: 17 mg/dL (ref 0–40)

## 2019-10-06 LAB — TSH: TSH: 0.202 u[IU]/mL — ABNORMAL LOW (ref 0.350–4.500)

## 2019-10-06 LAB — T4, FREE: Free T4: 0.88 ng/dL (ref 0.61–1.12)

## 2019-10-07 LAB — T3, FREE: T3, Free: 3.5 pg/mL (ref 2.0–4.4)

## 2019-10-07 LAB — THYROGLOBULIN ANTIBODY: Thyroglobulin Antibody: <1 IU/mL (ref 0.0–0.9)

## 2019-10-07 LAB — THYROID PEROXIDASE ANTIBODY: Thyroperoxidase Ab SerPl-aCnc: <8 IU/mL (ref 0–34)

## 2019-10-08 ENCOUNTER — Ambulatory Visit
Admission: EM | Admit: 2019-10-08 | Discharge: 2019-10-08 | Disposition: A | Discharge status: Home / Self Care | Payer: Medicare Other | Attending: Emergency Medicine | Admitting: Emergency Medicine

## 2019-10-08 DIAGNOSIS — M542 Cervicalgia: Secondary | ICD-10-CM

## 2019-10-08 DIAGNOSIS — S161XXA Strain of muscle, fascia and tendon at neck level, initial encounter: Secondary | ICD-10-CM

## 2019-10-08 IMAGING — CT CT L SPINE W/O CM
3 series · 11 of 33 positions shown, 13 images · non-contrast
Comparison: 07/06/2012 lumbar MRI

CLINICAL DATA: Six weeks of lower back pain radiating down the left
leg.

EXAM:
CT LUMBAR SPINE WITHOUT CONTRAST
TECHNIQUE: Multidetector CT imaging of the lumbar spine was performed without
intravenous contrast administration. Multiplanar CT image
reconstructions were also generated.

[Series 4: l spine soft · axial · 0.31mm/px · z∈[+995,+1149]mm · 3 of 126 slices shown, 4 images]
[im 29/126  soft-tissue]
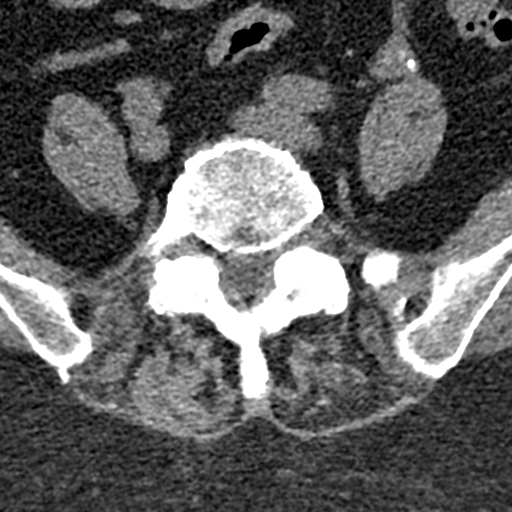
[im 29/126  bone]
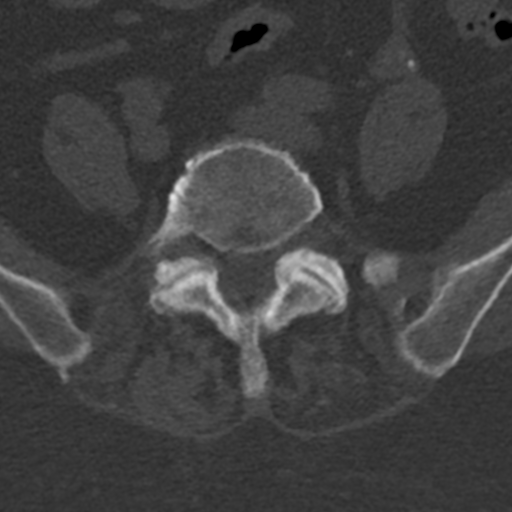
[im 68/126  bone]
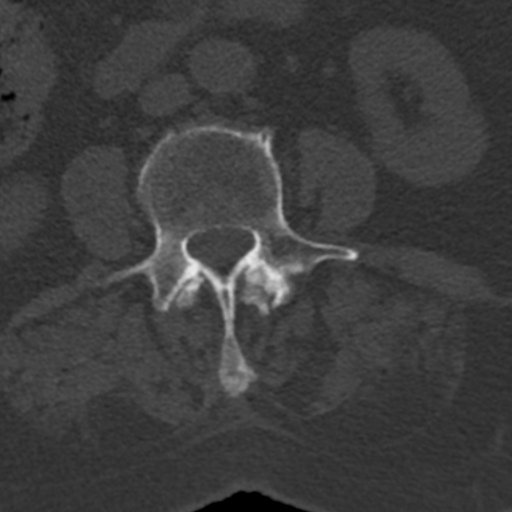
[im 106/126  bone]
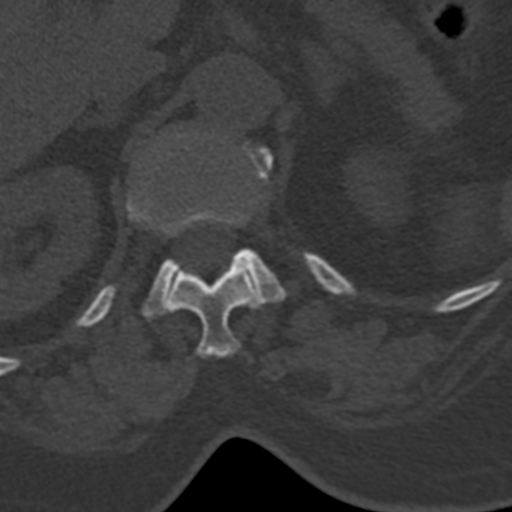

[Series 5: sagittal bone · sagittal · 0.29mm/px · 5 of 74 slices shown, 6 images]
[im 25/74  bone]
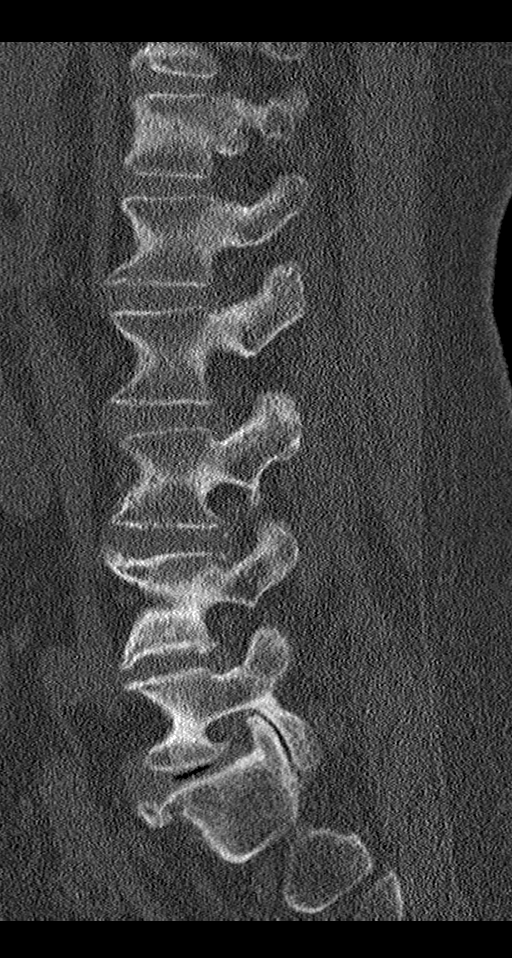
[im 31/74  bone]
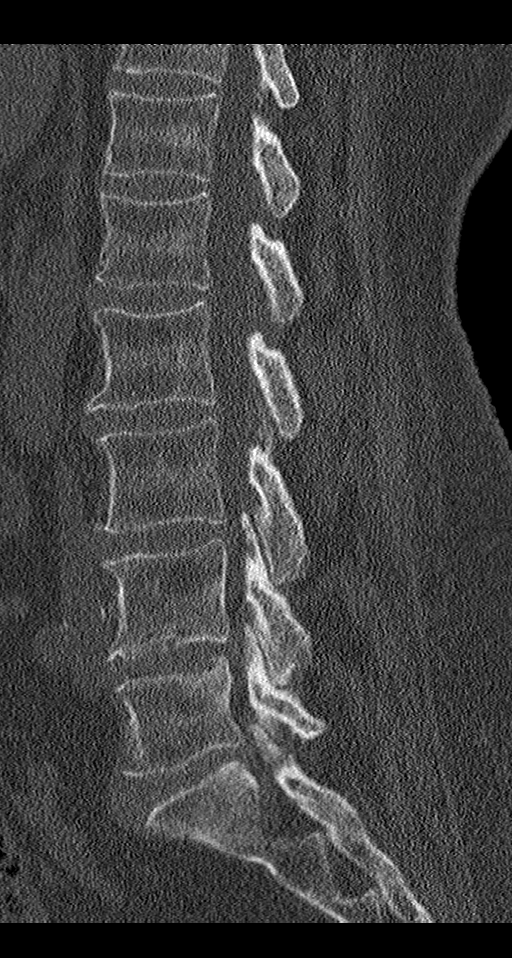
[im 37/74  soft-tissue]
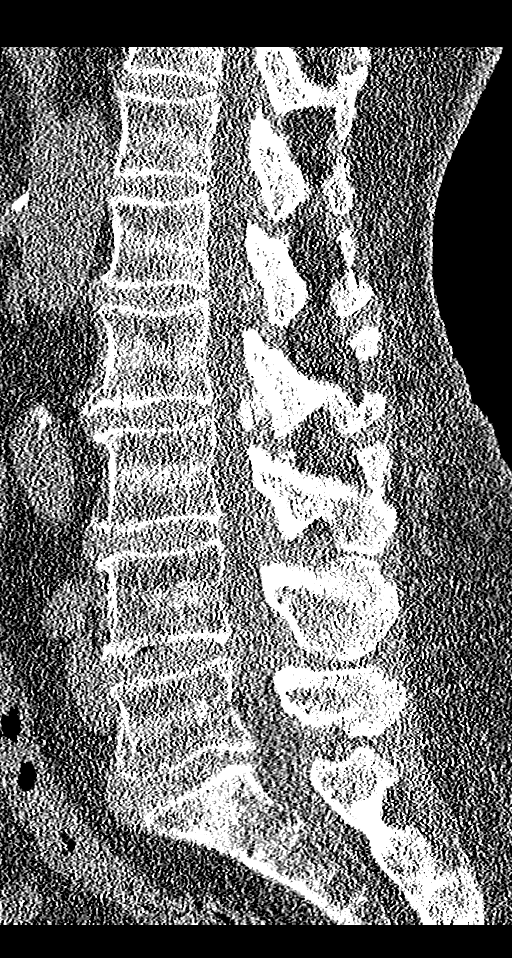
[im 37/74  bone]
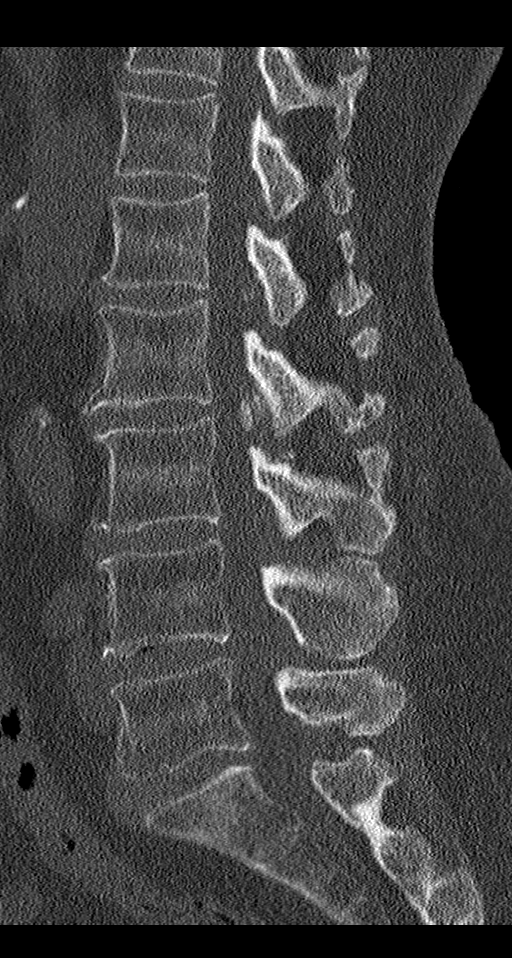
[im 43/74  bone]
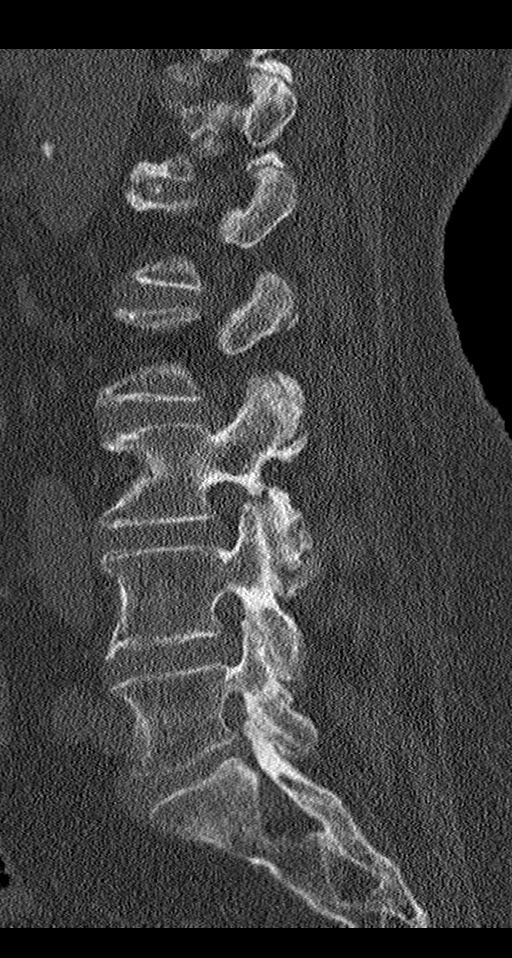
[im 49/74  bone]
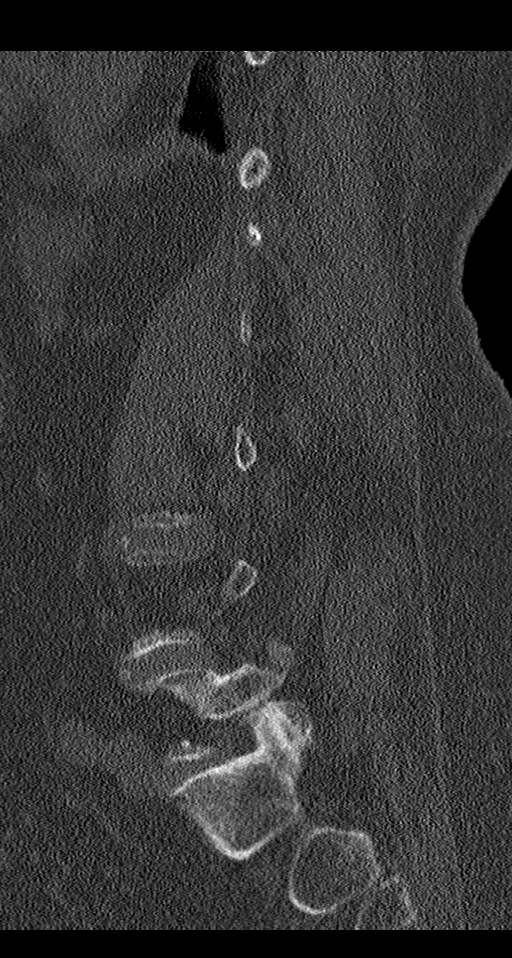

[Series 6: coronal bone · coronal · 0.34mm/px · 3 of 69 slices shown]
[im 14/69  bone]
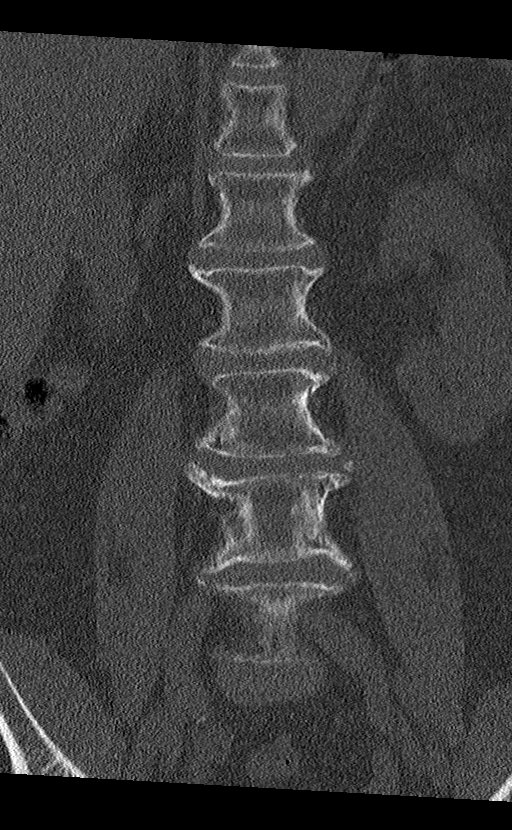
[im 28/69  bone]
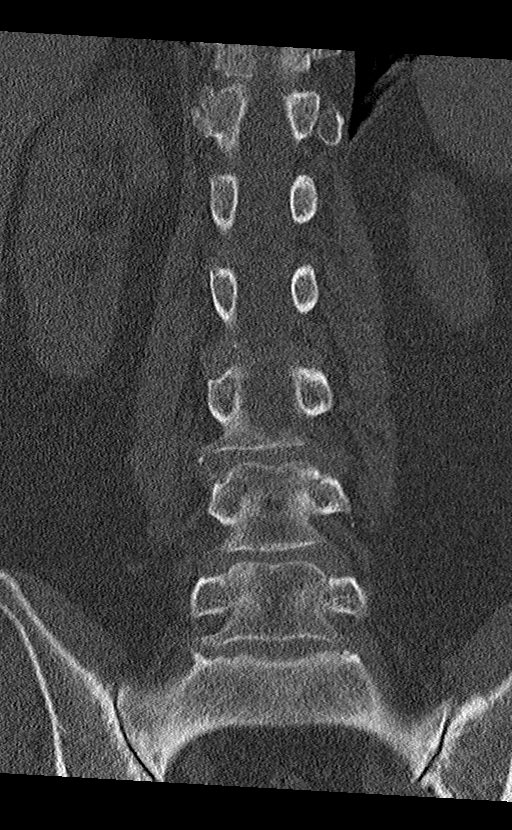
[im 41/69  bone]
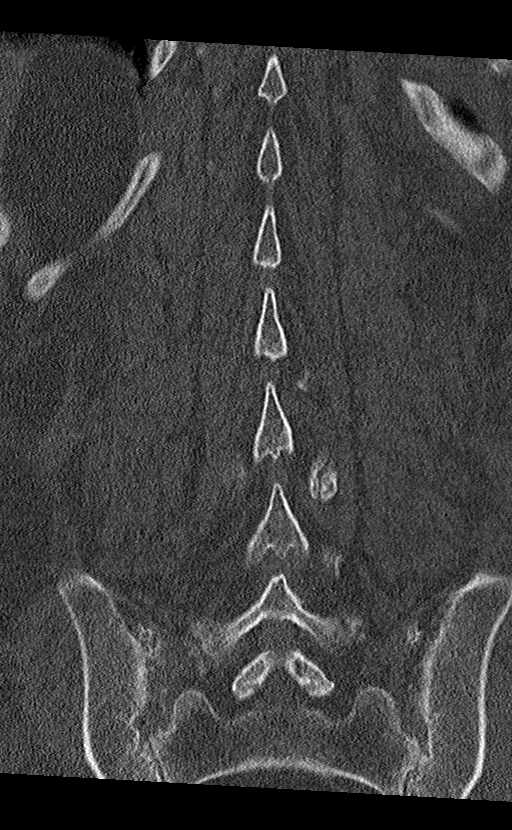

[11 of 33 positions shown; findings below may reference images not displayed]

FINDINGS: Segmentation: 5 lumbar type vertebrae.

Alignment: Straightening of lumbar lordosis without listhesis.

Vertebrae: No acute fracture or focal pathologic process.

Paraspinal and other soft tissues: Negative.

Disc levels: Multilevel mild discogenic degenerative changes with
loss of disc space height and small anterior marginal osteophytes.
Moderate facet hypertrophy greater on the left. Mild L3-4 and L4-5
foraminal stenosis. Mild right moderate left L5-S1 foraminal
stenosis.
IMPRESSION: 1. No acute fracture or dislocation.
2. Straightening of lumbar lordosis without listhesis.
3. Stable lumbar spine degenerative changes from prior lumbar MRI
given differences in technique.

By: Kumasa Stages M.D.

## 2019-10-08 MED ORDER — TIZANIDINE HCL 2 MG PO CAPS
2.0000 mg | ORAL_CAPSULE | Freq: Every day | ORAL | 0 refills | Status: DC
Start: 2019-10-08 — End: 2019-10-20

## 2019-10-08 MED ORDER — NAPROXEN 375 MG PO TABS
375.0000 mg | ORAL_TABLET | Freq: Two times a day (BID) | ORAL | 0 refills | Status: DC
Start: 2019-10-08 — End: 2019-11-04

## 2019-10-08 NOTE — Discharge Instructions (Addendum)
Continue conservative management of rest, ice, heat, and gentle stretches/ massage Take naproxen as needed for pain relief (may cause abdominal discomfort, ulcers, and GI bleeds avoid taking with other NSAIDs) Continue with muscle relaxer as prescribed Follow up with PCP if symptoms persist Return or go to the ER if you have any new or worsening symptoms (fever, chills, chest pain, redness, swelling, bruising, deformity, etc...)

## 2019-10-08 NOTE — ED Triage Notes (Signed)
Pt presents with neck pain that began last night , denies injury
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
## 2019-10-12 ENCOUNTER — Telehealth (INDEPENDENT_AMBULATORY_CARE_PROVIDER_SITE_OTHER): Payer: Medicare Other | Admitting: "Endocrinology

## 2019-10-12 ENCOUNTER — Ambulatory Visit (INDEPENDENT_AMBULATORY_CARE_PROVIDER_SITE_OTHER): Payer: Medicare Other | Admitting: Internal Medicine

## 2019-10-12 ENCOUNTER — Other Ambulatory Visit: Payer: Self-pay

## 2019-10-12 ENCOUNTER — Encounter: Payer: Self-pay | Admitting: "Endocrinology

## 2019-10-12 ENCOUNTER — Encounter: Payer: Self-pay | Admitting: Internal Medicine

## 2019-10-12 VITALS — BP 129/77 | HR 77 | Temp 97.1°F | Resp 18 | Ht 63.0 in | Wt 185.1 lb

## 2019-10-12 DIAGNOSIS — S46812A Strain of other muscles, fascia and tendons at shoulder and upper arm level, left arm, initial encounter: Secondary | ICD-10-CM | POA: Diagnosis not present

## 2019-10-12 DIAGNOSIS — E049 Nontoxic goiter, unspecified: Secondary | ICD-10-CM | POA: Diagnosis not present

## 2019-10-12 DIAGNOSIS — Z23 Encounter for immunization: Secondary | ICD-10-CM | POA: Diagnosis not present
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
## 2019-10-12 NOTE — Patient Instructions (Addendum)
Please apply heating pad to the neck area to help with inflammation. Okay to apply local pain relieving cream, like Bengay or IcyHot.  Please perform simple neck exercise as discussed. Please massage gently in the neck and shoulder area as instructed today.  Beginning at the base of the neck, use one hand to knead the muscles located at the top of the shoulder on the opposite side (of the hand you're using.) The action is similar to kneading bread dough. Work with a slow, rhythmic action, moving out toward the arm in increments. Use a pressure that is deep enough to make a difference, but still feels good (and safe.) Repeat 2 to 3 times on that side and then do it again on the other shoulder.   [email or web address]  Please call us if symptoms don't improve in next 1 week.  Please get medical attention if you have numbness, weakness or tingling in the arms or if you start to feel dizziness.
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
## 2019-10-14 ENCOUNTER — Other Ambulatory Visit: Payer: Self-pay

## 2019-10-14 ENCOUNTER — Encounter (HOSPITAL_COMMUNITY): Payer: Self-pay | Admitting: *Deleted

## 2019-10-14 ENCOUNTER — Telehealth: Payer: Self-pay | Admitting: *Deleted

## 2019-10-14 ENCOUNTER — Emergency Department (HOSPITAL_COMMUNITY): Payer: Medicare Other

## 2019-10-14 ENCOUNTER — Emergency Department (HOSPITAL_COMMUNITY)
Admission: EM | Admit: 2019-10-14 | Discharge: 2019-10-14 | Disposition: A | Discharge status: Home / Self Care | Payer: Medicare Other | Attending: Emergency Medicine | Admitting: Emergency Medicine

## 2019-10-14 DIAGNOSIS — Z853 Personal history of malignant neoplasm of breast: Secondary | ICD-10-CM | POA: Insufficient documentation

## 2019-10-14 DIAGNOSIS — E1140 Type 2 diabetes mellitus with diabetic neuropathy, unspecified: Secondary | ICD-10-CM | POA: Insufficient documentation

## 2019-10-14 DIAGNOSIS — H9202 Otalgia, left ear: Secondary | ICD-10-CM | POA: Diagnosis not present

## 2019-10-14 DIAGNOSIS — Z7984 Long term (current) use of oral hypoglycemic drugs: Secondary | ICD-10-CM | POA: Insufficient documentation

## 2019-10-14 DIAGNOSIS — Z7982 Long term (current) use of aspirin: Secondary | ICD-10-CM | POA: Insufficient documentation

## 2019-10-14 DIAGNOSIS — E1169 Type 2 diabetes mellitus with other specified complication: Secondary | ICD-10-CM | POA: Insufficient documentation

## 2019-10-14 DIAGNOSIS — Z79899 Other long term (current) drug therapy: Secondary | ICD-10-CM | POA: Insufficient documentation

## 2019-10-14 DIAGNOSIS — M542 Cervicalgia: Secondary | ICD-10-CM | POA: Insufficient documentation

## 2019-10-14 DIAGNOSIS — I10 Essential (primary) hypertension: Secondary | ICD-10-CM | POA: Diagnosis not present

## 2019-10-14 DIAGNOSIS — J3489 Other specified disorders of nose and nasal sinuses: Secondary | ICD-10-CM | POA: Diagnosis not present

## 2019-10-14 DIAGNOSIS — E785 Hyperlipidemia, unspecified: Secondary | ICD-10-CM | POA: Diagnosis not present

## 2019-10-14 DIAGNOSIS — R519 Headache, unspecified: Secondary | ICD-10-CM | POA: Insufficient documentation

## 2019-10-14 DIAGNOSIS — H7090 Unspecified mastoiditis, unspecified ear: Secondary | ICD-10-CM | POA: Diagnosis not present

## 2019-10-14 DIAGNOSIS — J320 Chronic maxillary sinusitis: Secondary | ICD-10-CM | POA: Diagnosis not present

## 2019-10-14 LAB — BASIC METABOLIC PANEL
Anion gap: 12 (ref 5–15)
BUN: 14 mg/dL (ref 8–23)
CO2: 24 mmol/L (ref 22–32)
Calcium: 9.7 mg/dL (ref 8.9–10.3)
Chloride: 101 mmol/L (ref 98–111)
Creatinine, Ser: 0.63 mg/dL (ref 0.44–1.00)
GFR calc non Af Amer: >60 mL/min (ref >60)
Glucose, Bld: 99 mg/dL (ref 70–99)
Potassium: 4.5 mmol/L (ref 3.5–5.1)
Sodium: 137 mmol/L (ref 135–145)

## 2019-10-14 MED ORDER — ACETAMINOPHEN 500 MG PO TABS
1000.0000 mg | ORAL_TABLET | Freq: Once | ORAL | Status: AC
  Administered 2019-10-14: 1000 mg via ORAL
  Filled 2019-10-14: qty 2

## 2019-10-14 MED ORDER — IOHEXOL 300 MG/ML  SOLN
75.0000 mL | Freq: Once | INTRAMUSCULAR | Status: AC | PRN
  Administered 2019-10-14: 75 mL via INTRAVENOUS

## 2019-10-14 NOTE — Telephone Encounter (Signed)
Santiago Glad pts daugher called pt had an appt 2 days ago about sharp pains in head and neck and she had been seen in urgent care and patel advised to continue taking medications prescribed by urgent care. She is very concerned as her mom has been up through the night complaining and she is not a complainer. She is upset as there has been no MRI or any kind of test done. Spoke with Dr Moshe Cipro and per dr simpson sent pt to ER to be evaluated.

## 2019-10-14 NOTE — ED Notes (Signed)
I assumed care of this patient at this time. Pt appears to be sitting on the edge of the bed with no signs of distress noted. Bed is locked in the lowest position, side rails x1, call bell within reach. All questions and concerns voiced addressed at this time. Pt educated on call light and hourly rounding and is in agreement at this time.
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
## 2019-10-14 NOTE — ED Triage Notes (Signed)
Pt with left neck pain since Thursday.  Pt has been seen at Urgent Care on Friday and dx with a "pinched nerve".  No relief from medication that was prescribed.

## 2019-10-14 NOTE — ED Notes (Signed)
Pt up and ambulatory to restroom with personal cane and without assistance from staff.

## 2019-10-14 NOTE — ED Notes (Signed)
Pt transported to CT by CT staff at this time.  

## 2019-10-14 NOTE — Discharge Instructions (Addendum)
Your CT scan tonight and your exam are reassuring with no signs of abnormality around your left ear, mastoid region and scalp.  I recommend continuing the medicines you were prescribed at your recent outpatient visit, but also adding tylenol 650 mg every 6 hours.  Apply a heating pad to your neck and ear region for 15 minutes 3-4 times daily followed by gentle stretching exercises.  Also continue doing the massage therapy you were instructed at your last office visit.

## 2019-10-19 ENCOUNTER — Other Ambulatory Visit: Payer: Self-pay | Admitting: Family Medicine

## 2019-10-20 ENCOUNTER — Other Ambulatory Visit: Payer: Self-pay

## 2019-10-20 ENCOUNTER — Encounter: Payer: Self-pay | Admitting: Family Medicine

## 2019-10-20 ENCOUNTER — Ambulatory Visit (INDEPENDENT_AMBULATORY_CARE_PROVIDER_SITE_OTHER): Payer: Medicare Other | Admitting: Family Medicine

## 2019-10-20 VITALS — BP 135/72 | HR 100 | Ht 63.0 in | Wt 183.0 lb

## 2019-10-20 DIAGNOSIS — Z09 Encounter for follow-up examination after completed treatment for conditions other than malignant neoplasm: Secondary | ICD-10-CM | POA: Diagnosis not present

## 2019-10-20 DIAGNOSIS — R519 Headache, unspecified: Secondary | ICD-10-CM | POA: Insufficient documentation

## 2019-10-20 DIAGNOSIS — I10 Essential (primary) hypertension: Secondary | ICD-10-CM | POA: Diagnosis not present

## 2019-10-20 DIAGNOSIS — R2681 Unsteadiness on feet: Secondary | ICD-10-CM | POA: Diagnosis not present

## 2019-10-20 DIAGNOSIS — J302 Other seasonal allergic rhinitis: Secondary | ICD-10-CM

## 2019-10-20 DIAGNOSIS — E1140 Type 2 diabetes mellitus with diabetic neuropathy, unspecified: Secondary | ICD-10-CM

## 2019-10-20 DIAGNOSIS — G4452 New daily persistent headache (NDPH): Secondary | ICD-10-CM

## 2019-10-20 DIAGNOSIS — G44009 Cluster headache syndrome, unspecified, not intractable: Secondary | ICD-10-CM

## 2019-10-20 MED ORDER — AZELASTINE HCL 0.1 % NA SOLN: 2.0000 | Freq: Two times a day (BID) | NASAL | 2 refills | Status: DC

## 2019-10-20 NOTE — Progress Notes (Signed)
DARRIANA DEBOY     MRN: [id number]      DOB: 24-Aug-1933   HPI Ms. Verville is here for follow up of Ed visdit on 07/13 for left sided headcache, still having intermittent pain localized to left side head , down from a 10. Hqad spo in her visoon when the headache first started, also new Is a feeling as though she will fall when she bends over. No ionciting pain headache just came on Duration approx 5 minutes approx  3times /daySome improvement, notes zanaflex makes her sleepy No new vision change, weakness , numbness, difficulty with speech or swallowing or incontinence ROS Denies recent fever or chills. Denies sinus pressure, nasal congestion, ear pain or sore throat. Denies chest congestion, productive cough or wheezing. Denies chest pains, palpitations and leg swelling Denies abdominal pain, nausea, vomiting,diarrhea or constipation.   Denies dysuria, frequency, hesitancy or incontinence. Denies joint pain, swelling and limitation in mobility.  Denies depression, anxiety or insomnia. Denies skin break down or rash.   PE  BP 135/72   Pulse 100   Ht 5\' 3"  (1.6 m)   Wt 183 lb (83 kg)   BMI 32.42 kg/m   Patient alert and oriented and in no cardiopulmonary distress.  HEENT: No facial asymmetry, EOMI,     Neck supple .  Chest: Clear to auscultation bilaterally.  CVS: S1, S2 no murmurs, no S3.Regular rate.  ABD: Soft non tender.   Ext: No edema  MS: Adequate ROM spine, shoulders, hips and knees.  Skin: Intact, no ulcerations or rash noted.  Psych: Good eye contact, normal affect. Memory intact not anxious or depressed appearing.  CNS: CN 2-12 intact, power,  normal throughout.no focal deficits noted.   Assessment & Plan  New daily persistent headache New headache, left posterior behind ear, started 10/13, seen in ED, still present, come relief tough not complete from NSAID and zanaflex, normal CT temporal bone scan where pain is most severe, refer to Neurology. Stop  zanaflex as makes her sleepy   Unsteady gait Advised pt to stop  zanaflex  HTN, goal below 130/80 Controlled, no change in medication   Diabetes mellitus with neuropathy Ms. Cyphers is reminded of the importance of commitment to daily physical activity for 30 minutes or more, as able and the need to limit carbohydrate intake to 30 to 60 grams per meal to help with blood sugar control.   The need to take medication as prescribed, test blood sugar as directed, and to call between visits if there is a concern that blood sugar is uncontrolled is also discussed.   Ms. Malachi is reminded of the importance of daily foot exam, annual eye examination, and good blood sugar, blood pressure and cholesterol control. Controlled, no change in medication   Diabetic Labs Latest Ref Rng & Units 10/14/2019 10/06/2019 09/22/2019 08/24/2019 08/24/2019  HbA1c 4.0 - 5.6 % - - - 6.6(A) 6.6  Microalbumin Not Estab. ug/mL - - - - -  Micro/Creat Ratio 0 - 29 mg/g creat - - - - -  Chol 0 - 200 mg/dL - 165 153 - -  HDL >40 mg/dL - 54 53 - -  Calc LDL 0 - 99 mg/dL - 94 82 - -  Triglycerides <150 mg/dL - 83 89 - -  Creatinine 0.44 - 1.00 mg/dL 0.63 0.64 0.62 - -   BP/Weight 10/20/2019 10/14/2019 10/12/2019 10/08/2019 09/07/2019 08/25/2019 9/73/5329  Systolic BP 924 268 341 962 229 798 921  Diastolic BP 72 63  77 78 76 75 75  Wt. (Lbs) 183 - 185.12 - 181.8 183 183  BMI 32.42 - 32.79 - 32.2 32.42 32.42   Foot/eye exam completion dates Latest Ref Rng & Units 07/08/2019 04/20/2019  Eye Exam No Retinopathy - -  Foot exam Order - - -  Foot Form Completion - Done Done        Encounter for examination following treatment at hospital Patient in for follow up of recent ED visit on 10/14/2019 Discharge summary, and laboratory and radiology data are reviewed, and any questions or concerns  are discussed. Specific issues requiring follow up are specifically addressed.

## 2019-10-20 NOTE — Patient Instructions (Addendum)
F/U as before , call if you need me sooner  Please hold off on bedtime medication as it makes you unsteady, zanaflex Complete the naproxen as prescribed  I am referring you to a Neurologist for further evaluation  New for allergies is Astelin  Thanks for choosing [facility], we consider it a privelige to serve you.

## 2019-10-23 ENCOUNTER — Encounter: Payer: Self-pay | Admitting: Family Medicine

## 2019-10-23 DIAGNOSIS — G4452 New daily persistent headache (NDPH): Secondary | ICD-10-CM | POA: Insufficient documentation

## 2019-10-23 DIAGNOSIS — Z09 Encounter for follow-up examination after completed treatment for conditions other than malignant neoplasm: Secondary | ICD-10-CM | POA: Insufficient documentation

## 2019-10-23 NOTE — Assessment & Plan Note (Signed)
Uncontrolled , start astelin

## 2019-10-23 NOTE — Assessment & Plan Note (Signed)
Controlled, no change in medication  

## 2019-10-23 NOTE — Assessment & Plan Note (Signed)
New headache, left posterior behind ear, started 10/13, seen in ED, still present, come relief tough not complete from NSAID and zanaflex, normal CT temporal bone scan where pain is most severe, refer to Neurology. Stop zanaflex as makes her sleepy

## 2019-10-23 NOTE — Assessment & Plan Note (Signed)
Patient in for follow up of recent ED visit on 10/14/2019 Discharge summary, and laboratory and radiology data are reviewed, and any questions or concerns  are discussed. Specific issues requiring follow up are specifically addressed.

## 2019-10-23 NOTE — Assessment & Plan Note (Signed)
Lisa Mann is reminded of the importance of commitment to daily physical activity for 30 minutes or more, as able and the need to limit carbohydrate intake to 30 to 60 grams per meal to help with blood sugar control.   The need to take medication as prescribed, test blood sugar as directed, and to call between visits if there is a concern that blood sugar is uncontrolled is also discussed.   Lisa Mann is reminded of the importance of daily foot exam, annual eye examination, and good blood sugar, blood pressure and cholesterol control. Controlled, no change in medication   Diabetic Labs Latest Ref Rng & Units 10/14/2019 10/06/2019 09/22/2019 08/24/2019 08/24/2019  HbA1c 4.0 - 5.6 % - - - 6.6(A) 6.6  Microalbumin Not Estab. ug/mL - - - - -  Micro/Creat Ratio 0 - 29 mg/g creat - - - - -  Chol 0 - 200 mg/dL - 165 153 - -  HDL >40 mg/dL - 54 53 - -  Calc LDL 0 - 99 mg/dL - 94 82 - -  Triglycerides <150 mg/dL - 83 89 - -  Creatinine 0.44 - 1.00 mg/dL 0.63 0.64 0.62 - -   BP/Weight 10/20/2019 10/14/2019 10/12/2019 10/08/2019 09/07/2019 08/25/2019 09/04/9369  Systolic BP 696 789 381 017 510 258 527  Diastolic BP 72 63 77 78 76 75 75  Wt. (Lbs) 183 - 185.12 - 181.8 183 183  BMI 32.42 - 32.79 - 32.2 32.42 32.42   Foot/eye exam completion dates Latest Ref Rng & Units 07/08/2019 04/20/2019  Eye Exam No Retinopathy - -  Foot exam Order - - -  Foot Form Completion - Done Done

## 2019-10-23 NOTE — Assessment & Plan Note (Signed)
Advised pt to stop  zanaflex

## 2019-10-25 ENCOUNTER — Telehealth: Payer: Self-pay

## 2019-10-25 ENCOUNTER — Other Ambulatory Visit: Payer: Self-pay | Admitting: Family Medicine

## 2019-10-25 NOTE — Telephone Encounter (Signed)
Daughter aware urgent referral sent and to call back tomorrow afternoon if havent heard anything

## 2019-10-25 NOTE — Telephone Encounter (Signed)
Daughter is very worried about mother. Was referred to neurology and had requested local but daughter states she is needing an appt somewhere as soon as possible anywhere that can see her ([place], [place] etc) states her mother is in terrible pain and that she needs to see someone as soon as possible and she will take off work to take her whereeverwhenever she can be seen. Please advise Daughter [phone]

## 2019-10-25 NOTE — Telephone Encounter (Signed)
New urgent referral sent to [place] [facility]

## 2019-10-28 ENCOUNTER — Other Ambulatory Visit (HOSPITAL_COMMUNITY): Payer: Self-pay | Admitting: Neurology

## 2019-10-28 DIAGNOSIS — G4486 Cervicogenic headache: Secondary | ICD-10-CM

## 2019-10-28 DIAGNOSIS — E119 Type 2 diabetes mellitus without complications: Secondary | ICD-10-CM | POA: Diagnosis not present

## 2019-10-28 DIAGNOSIS — G4701 Insomnia due to medical condition: Secondary | ICD-10-CM | POA: Diagnosis not present

## 2019-10-28 DIAGNOSIS — M542 Cervicalgia: Secondary | ICD-10-CM | POA: Diagnosis not present

## 2019-10-28 DIAGNOSIS — I10 Essential (primary) hypertension: Secondary | ICD-10-CM | POA: Diagnosis not present

## 2019-10-29 ENCOUNTER — Ambulatory Visit (HOSPITAL_COMMUNITY)
Admission: RE | Admit: 2019-10-29 | Discharge: 2019-10-29 | Disposition: A | Discharge status: Home / Self Care | Payer: Medicare Other | Source: Ambulatory Visit | Attending: Neurology | Admitting: Neurology

## 2019-10-29 ENCOUNTER — Other Ambulatory Visit: Payer: Self-pay

## 2019-10-29 DIAGNOSIS — G4486 Cervicogenic headache: Secondary | ICD-10-CM | POA: Insufficient documentation

## 2019-10-29 DIAGNOSIS — M47812 Spondylosis without myelopathy or radiculopathy, cervical region: Secondary | ICD-10-CM | POA: Diagnosis not present

## 2019-10-29 DIAGNOSIS — M419 Scoliosis, unspecified: Secondary | ICD-10-CM | POA: Diagnosis not present

## 2019-11-03 ENCOUNTER — Other Ambulatory Visit: Payer: Self-pay

## 2019-11-03 ENCOUNTER — Encounter (HOSPITAL_COMMUNITY): Payer: Self-pay

## 2019-11-03 ENCOUNTER — Emergency Department (HOSPITAL_COMMUNITY)
Admission: EM | Admit: 2019-11-03 | Discharge: 2019-11-04 | Disposition: A | Discharge status: Home / Self Care | Payer: Medicare Other | Attending: Emergency Medicine | Admitting: Emergency Medicine

## 2019-11-03 ENCOUNTER — Emergency Department (HOSPITAL_COMMUNITY): Payer: Medicare Other

## 2019-11-03 DIAGNOSIS — R6889 Other general symptoms and signs: Secondary | ICD-10-CM | POA: Diagnosis not present

## 2019-11-03 DIAGNOSIS — Z7982 Long term (current) use of aspirin: Secondary | ICD-10-CM | POA: Diagnosis not present

## 2019-11-03 DIAGNOSIS — Z923 Personal history of irradiation: Secondary | ICD-10-CM | POA: Diagnosis not present

## 2019-11-03 DIAGNOSIS — Z79899 Other long term (current) drug therapy: Secondary | ICD-10-CM | POA: Diagnosis not present

## 2019-11-03 DIAGNOSIS — I10 Essential (primary) hypertension: Secondary | ICD-10-CM | POA: Insufficient documentation

## 2019-11-03 DIAGNOSIS — R519 Headache, unspecified: Secondary | ICD-10-CM | POA: Diagnosis not present

## 2019-11-03 DIAGNOSIS — E1140 Type 2 diabetes mellitus with diabetic neuropathy, unspecified: Secondary | ICD-10-CM | POA: Diagnosis not present

## 2019-11-03 DIAGNOSIS — Z853 Personal history of malignant neoplasm of breast: Secondary | ICD-10-CM | POA: Insufficient documentation

## 2019-11-03 DIAGNOSIS — Z743 Need for continuous supervision: Secondary | ICD-10-CM | POA: Diagnosis not present

## 2019-11-03 DIAGNOSIS — Z7984 Long term (current) use of oral hypoglycemic drugs: Secondary | ICD-10-CM | POA: Diagnosis not present

## 2019-11-03 MED ORDER — MELOXICAM 7.5 MG PO TABS: 7.5000 mg | ORAL_TABLET | Freq: Every day | ORAL | 0 refills | Status: DC

## 2019-11-03 MED ORDER — MELOXICAM 7.5 MG PO TABS
7.5000 mg | ORAL_TABLET | Freq: Once | ORAL | Status: AC
  Administered 2019-11-04: 7.5 mg via ORAL
  Filled 2019-11-03 (×2): qty 1

## 2019-11-03 NOTE — ED Triage Notes (Signed)
Pt to er via ems, pt states that she has been having a headache radiating down into her neck for the past month.  States that recently the pain has gotten worse and now she is having some weakness in her L leg.  Pt states that she had had the weakness for the past three days.
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
## 2019-11-04 ENCOUNTER — Encounter: Payer: Self-pay | Admitting: Family Medicine

## 2019-11-04 ENCOUNTER — Ambulatory Visit (INDEPENDENT_AMBULATORY_CARE_PROVIDER_SITE_OTHER): Payer: Medicare Other | Admitting: Family Medicine

## 2019-11-04 VITALS — BP 120/70 | HR 88 | Resp 16 | Ht 63.0 in | Wt 181.0 lb

## 2019-11-04 DIAGNOSIS — Z09 Encounter for follow-up examination after completed treatment for conditions other than malignant neoplasm: Secondary | ICD-10-CM

## 2019-11-04 DIAGNOSIS — H547 Unspecified visual loss: Secondary | ICD-10-CM

## 2019-11-04 DIAGNOSIS — R42 Dizziness and giddiness: Secondary | ICD-10-CM | POA: Diagnosis not present

## 2019-11-04 DIAGNOSIS — G4452 New daily persistent headache (NDPH): Secondary | ICD-10-CM

## 2019-11-04 DIAGNOSIS — H9110 Presbycusis, unspecified ear: Secondary | ICD-10-CM

## 2019-11-04 MED ORDER — TIZANIDINE HCL 2 MG PO TABS: ORAL_TABLET | ORAL | 1 refills | Status: DC

## 2019-11-04 NOTE — Patient Instructions (Signed)
F/u as before, call if you need me sooner  You are referred for brain scan and carotid doppler study.  You are referred to Dr Katy Fitch and for hearing evaluation   New sooner appointments with Dr Merlene Laughter is requested  Increase aspirin 81 mg tablet, take 2 daily for 2 days then increase to 3 tablets daily, if you can tolerate this dose  Use zanaflex once daily only as needed for severe headache, may take tylenol with this  Be careful not to fall  If you develop New or worsening headache, vision disturbance , weakness, you need to go to the ED

## 2019-11-04 NOTE — Progress Notes (Signed)
   Lisa Mann     MRN: [id number]      DOB: [birth year]   HPI Lisa Mann is here for follow up from ED visit last night, when EMS was called to her home due to c/o left sided headache, blurry left vision and feeling weak on left side Concern was for stroke. Normal; head scan in ED. Headaches have been ongoing for past over 6 weeks, with no relief, she has seen Neurology Still has mild left upper extremity weakness C/o increased stress due to illness in her sibling C/o gradual hearing loss also blurred vision ROS Denies recent fever or chills. Denies sinus pressure, nasal congestion, ear pain or sore throat. Denies chest congestion, productive cough or wheezing. Denies chest pains, palpitations and leg swelling Denies abdominal pain, nausea, vomiting,diarrhea or constipation.   Denies dysuria, frequency, hesitancy or incontinence. Denies joint pain, swelling and limitation in mobility. . Denies skin break down or rash.   PE  BP 105/67   Pulse 88   Resp 16   Ht 5\' 3"  (1.6 m)   Wt 181 lb 0.6 oz (82.1 kg)   SpO2 99%   BMI 32.07 kg/m   Patient alert and oriented and in no cardiopulmonary distress.  HEENT: No facial asymmetry, EOMI,     Neck supple .  Chest: Clear to auscultation bilaterally.  CVS: S1, S2 no murmurs, no S3.Regular rate.  ABD: Soft non tender.   Ext: No edema  MS: Adequate ROM spine, shoulders, hips and knees.  Skin: Intact, no ulcerations or rash noted.  Psych: Good eye contact, normal affect. Memory intact not anxious or depressed appearing.  CNS: CN 2-12 intact,grade 4 power in left upper extremity , otherwise normal, sensation intact  Assessment & Plan  Encounter for examination following treatment at hospital Patient in for follow up of recent ED visit on 11/03/2019 for headache Discharge summary, and laboratory and radiology data are reviewed, and any questions or concerns  are discussed. Specific issues requiring follow up are specifically  addressed.   New daily persistent headache Approx 6 week h/o headache , left sided, intermittent, most recent accompanied by left weakness and blurred vision. MRI Brain, return to Neurology Start aspirin 325 mg daily  Carotid doppler  Light headedness Headache ,lightheadedness and unilateral weakness, carotid doppler

## 2019-11-07 ENCOUNTER — Encounter: Payer: Self-pay | Admitting: Family Medicine

## 2019-11-07 DIAGNOSIS — R42 Dizziness and giddiness: Secondary | ICD-10-CM | POA: Insufficient documentation

## 2019-11-07 NOTE — Assessment & Plan Note (Signed)
Patient in for follow up of recent ED visit on 11/03/2019 for headache Discharge summary, and laboratory and radiology data are reviewed, and any questions or concerns  are discussed. Specific issues requiring follow up are specifically addressed.

## 2019-11-07 NOTE — Assessment & Plan Note (Signed)
Approx 6 week h/o headache , left sided, intermittent, most recent accompanied by left weakness and blurred vision. MRI Brain, return to Neurology Start aspirin 325 mg daily  Carotid doppler

## 2019-11-07 NOTE — Assessment & Plan Note (Signed)
Headache ,lightheadedness and unilateral weakness, carotid doppler

## 2019-11-11 ENCOUNTER — Other Ambulatory Visit: Payer: Self-pay | Admitting: Family Medicine

## 2019-11-11 ENCOUNTER — Telehealth: Payer: Self-pay

## 2019-11-11 MED ORDER — TRAMADOL HCL 50 MG PO TABS
ORAL_TABLET | ORAL | 0 refills | Status: DC
Start: 2019-11-11 — End: 2020-01-12

## 2019-11-11 NOTE — Telephone Encounter (Signed)
Correction , tramadol is prescribed, not firoricet as that fagged as a potential allergy

## 2019-11-11 NOTE — Telephone Encounter (Signed)
Please let her know that I will prescribe fioricet and hope that this helps, no more than 2 tablets in 24 hours, short term

## 2019-11-11 NOTE — Telephone Encounter (Signed)
Patients daughter called stating her mother is still having head pain. She has her MRI this month. She is wanting to know if there is anything she can have for the pain.

## 2019-11-11 NOTE — Telephone Encounter (Signed)
Patients daughter, Santiago Glad, aware

## 2019-11-16 ENCOUNTER — Other Ambulatory Visit: Payer: Self-pay

## 2019-11-16 ENCOUNTER — Ambulatory Visit (HOSPITAL_COMMUNITY)
Admission: RE | Admit: 2019-11-16 | Discharge: 2019-11-16 | Disposition: A | Discharge status: Home / Self Care | Payer: Medicare Other | Source: Ambulatory Visit | Attending: Family Medicine | Admitting: Family Medicine

## 2019-11-16 DIAGNOSIS — R519 Headache, unspecified: Secondary | ICD-10-CM | POA: Diagnosis not present

## 2019-11-16 DIAGNOSIS — M7138 Other bursal cyst, other site: Secondary | ICD-10-CM | POA: Diagnosis not present

## 2019-11-16 DIAGNOSIS — G4452 New daily persistent headache (NDPH): Secondary | ICD-10-CM | POA: Insufficient documentation

## 2019-11-16 DIAGNOSIS — R42 Dizziness and giddiness: Secondary | ICD-10-CM | POA: Diagnosis not present

## 2019-11-16 DIAGNOSIS — I10 Essential (primary) hypertension: Secondary | ICD-10-CM | POA: Diagnosis not present

## 2019-11-16 DIAGNOSIS — I6523 Occlusion and stenosis of bilateral carotid arteries: Secondary | ICD-10-CM | POA: Diagnosis not present

## 2019-11-16 DIAGNOSIS — R220 Localized swelling, mass and lump, head: Secondary | ICD-10-CM | POA: Diagnosis not present

## 2019-11-16 DIAGNOSIS — R262 Difficulty in walking, not elsewhere classified: Secondary | ICD-10-CM | POA: Diagnosis not present

## 2019-11-16 DIAGNOSIS — E782 Mixed hyperlipidemia: Secondary | ICD-10-CM | POA: Diagnosis not present

## 2019-11-17 ENCOUNTER — Other Ambulatory Visit: Payer: Self-pay | Admitting: Family Medicine

## 2019-11-17 DIAGNOSIS — G4452 New daily persistent headache (NDPH): Secondary | ICD-10-CM

## 2019-11-17 DIAGNOSIS — R9402 Abnormal brain scan: Secondary | ICD-10-CM

## 2019-11-17 NOTE — Progress Notes (Signed)
mb neurosurg

## 2019-11-19 DIAGNOSIS — M542 Cervicalgia: Secondary | ICD-10-CM | POA: Diagnosis not present

## 2019-11-19 DIAGNOSIS — M4802 Spinal stenosis, cervical region: Secondary | ICD-10-CM | POA: Diagnosis not present

## 2019-11-24 DIAGNOSIS — H2513 Age-related nuclear cataract, bilateral: Secondary | ICD-10-CM | POA: Diagnosis not present

## 2019-11-24 DIAGNOSIS — H04123 Dry eye syndrome of bilateral lacrimal glands: Secondary | ICD-10-CM | POA: Diagnosis not present

## 2019-11-24 DIAGNOSIS — E119 Type 2 diabetes mellitus without complications: Secondary | ICD-10-CM | POA: Diagnosis not present

## 2019-11-24 LAB — HM DIABETES EYE EXAM

## 2019-12-04 ENCOUNTER — Other Ambulatory Visit: Payer: Self-pay | Admitting: Family Medicine

## 2019-12-07 DIAGNOSIS — E1151 Type 2 diabetes mellitus with diabetic peripheral angiopathy without gangrene: Secondary | ICD-10-CM | POA: Diagnosis not present

## 2019-12-07 DIAGNOSIS — E1140 Type 2 diabetes mellitus with diabetic neuropathy, unspecified: Secondary | ICD-10-CM | POA: Diagnosis not present

## 2019-12-07 DIAGNOSIS — I739 Peripheral vascular disease, unspecified: Secondary | ICD-10-CM | POA: Diagnosis not present

## 2019-12-07 DIAGNOSIS — L110 Acquired keratosis follicularis: Secondary | ICD-10-CM | POA: Diagnosis not present

## 2019-12-07 DIAGNOSIS — M79671 Pain in right foot: Secondary | ICD-10-CM | POA: Diagnosis not present

## 2020-01-06 ENCOUNTER — Other Ambulatory Visit: Payer: Self-pay

## 2020-01-06 ENCOUNTER — Other Ambulatory Visit (HOSPITAL_COMMUNITY)
Admission: RE | Admit: 2020-01-06 | Discharge: 2020-01-06 | Disposition: A | Discharge status: Home / Self Care | Payer: Medicare Other | Source: Ambulatory Visit | Attending: "Endocrinology | Admitting: "Endocrinology

## 2020-01-06 DIAGNOSIS — E049 Nontoxic goiter, unspecified: Secondary | ICD-10-CM | POA: Insufficient documentation

## 2020-01-06 LAB — TSH: TSH: 0.149 u[IU]/mL — ABNORMAL LOW (ref 0.350–4.500)

## 2020-01-06 LAB — T4, FREE: Free T4: 0.94 ng/dL (ref 0.61–1.12)

## 2020-01-07 LAB — T3, FREE: T3, Free: 3.7 pg/mL (ref 2.0–4.4)

## 2020-01-12 ENCOUNTER — Ambulatory Visit (INDEPENDENT_AMBULATORY_CARE_PROVIDER_SITE_OTHER): Payer: Medicare Other | Admitting: "Endocrinology

## 2020-01-12 ENCOUNTER — Other Ambulatory Visit: Payer: Self-pay

## 2020-01-12 ENCOUNTER — Encounter: Payer: Self-pay | Admitting: "Endocrinology

## 2020-01-12 VITALS — BP 133/74 | HR 94 | Ht 63.0 in | Wt 179.4 lb

## 2020-01-12 DIAGNOSIS — E049 Nontoxic goiter, unspecified: Secondary | ICD-10-CM

## 2020-01-12 DIAGNOSIS — E0590 Thyrotoxicosis, unspecified without thyrotoxic crisis or storm: Secondary | ICD-10-CM
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
## 2020-01-15 ENCOUNTER — Other Ambulatory Visit: Payer: Self-pay | Admitting: Family Medicine

## 2020-01-15 DIAGNOSIS — Z23 Encounter for immunization: Secondary | ICD-10-CM

## 2020-01-15 DIAGNOSIS — I10 Essential (primary) hypertension: Secondary | ICD-10-CM

## 2020-01-15 DIAGNOSIS — E785 Hyperlipidemia, unspecified: Secondary | ICD-10-CM

## 2020-01-15 DIAGNOSIS — E1169 Type 2 diabetes mellitus with other specified complication: Secondary | ICD-10-CM

## 2020-01-19 ENCOUNTER — Ambulatory Visit: Payer: Medicare Other | Admitting: "Endocrinology

## 2020-01-20 ENCOUNTER — Ambulatory Visit: Payer: Medicare Other | Admitting: "Endocrinology

## 2020-01-24 ENCOUNTER — Encounter: Payer: Self-pay | Admitting: Family Medicine

## 2020-01-24 ENCOUNTER — Other Ambulatory Visit: Payer: Self-pay

## 2020-01-24 ENCOUNTER — Telehealth (INDEPENDENT_AMBULATORY_CARE_PROVIDER_SITE_OTHER): Payer: Medicare Other | Admitting: Family Medicine

## 2020-01-24 VITALS — BP 118/60 | HR 83 | Ht 63.0 in | Wt 178.0 lb

## 2020-01-24 DIAGNOSIS — E1140 Type 2 diabetes mellitus with diabetic neuropathy, unspecified: Secondary | ICD-10-CM

## 2020-01-24 DIAGNOSIS — R2681 Unsteadiness on feet: Secondary | ICD-10-CM

## 2020-01-24 DIAGNOSIS — R7989 Other specified abnormal findings of blood chemistry: Secondary | ICD-10-CM | POA: Diagnosis not present

## 2020-01-24 DIAGNOSIS — E790 Hyperuricemia without signs of inflammatory arthritis and tophaceous disease: Secondary | ICD-10-CM | POA: Diagnosis not present

## 2020-01-24 DIAGNOSIS — E669 Obesity, unspecified: Secondary | ICD-10-CM

## 2020-01-24 DIAGNOSIS — E785 Hyperlipidemia, unspecified: Secondary | ICD-10-CM

## 2020-01-24 DIAGNOSIS — R29898 Other symptoms and signs involving the musculoskeletal system: Secondary | ICD-10-CM

## 2020-01-24 DIAGNOSIS — I10 Essential (primary) hypertension: Secondary | ICD-10-CM | POA: Diagnosis not present

## 2020-01-24 NOTE — Addendum Note (Signed)
Addended by: Eual Fines on: 01/24/2020 01:19 PM   Modules accepted: Orders

## 2020-01-24 NOTE — Assessment & Plan Note (Signed)
  Patient re-educated about  the importance of commitment to a  minimum of 150 minutes of exercise per week as able.  The importance of healthy food choices with portion control discussed, as well as eating regularly and within a 12 hour window most days. The need to choose "clean , green" food 50 to 75% of the time is discussed, as well as to make water the primary drink and set a goal of 64 ounces water daily.    Weight /BMI 01/24/2020 01/12/2020 11/04/2019  WEIGHT 178 lb 179 lb 6.4 oz 181 lb 0.6 oz  HEIGHT 5\' 3"  5\' 3"  5\' 3"   BMI 31.53 kg/m2 31.78 kg/m2 32.07 kg/m2

## 2020-01-24 NOTE — Assessment & Plan Note (Signed)
Being evaluated by Endo for subclinical hyperthyroidism

## 2020-01-24 NOTE — Assessment & Plan Note (Signed)
Controlled, no change in medication Lisa Mann is reminded of the importance of commitment to daily physical activity for 30 minutes or more, as able and the need to limit carbohydrate intake to 30 to 60 grams per meal to help with blood sugar control.   The need to take medication as prescribed, test blood sugar as directed, and to call between visits if there is a concern that blood sugar is uncontrolled is also discussed.   Lisa Mann is reminded of the importance of daily foot exam, annual eye examination, and good blood sugar, blood pressure and cholesterol control.  Diabetic Labs Latest Ref Rng & Units 10/14/2019 10/06/2019 09/22/2019 08/24/2019 08/24/2019  HbA1c 4.0 - 5.6 % - - - 6.6(A) 6.6  Microalbumin Not Estab. ug/mL - - - - -  Micro/Creat Ratio 0 - 29 mg/g creat - - - - -  Chol 0 - 200 mg/dL - 165 153 - -  HDL >40 mg/dL - 54 53 - -  Calc LDL 0 - 99 mg/dL - 94 82 - -  Triglycerides <150 mg/dL - 83 89 - -  Creatinine 0.44 - 1.00 mg/dL 0.63 0.64 0.62 - -   BP/Weight 01/24/2020 01/12/2020 11/04/2019 11/04/2019 11/03/2019 10/20/2019 39/0/3009  Systolic BP 233 007 622 633 - 354 562  Diastolic BP 60 74 70 67 - 72 63  Wt. (Lbs) 178 179.4 181.04 - 183 183 -  BMI 31.53 31.78 32.07 - 32.42 32.42 -   Foot/eye exam completion dates Latest Ref Rng & Units 11/24/2019 07/08/2019  Eye Exam No Retinopathy No Retinopathy -  Foot exam Order - - -  Foot Form Completion - - Done   Updated lab needed at/ before next visit.

## 2020-01-24 NOTE — Assessment & Plan Note (Signed)
Reports worsening weakness , requests OT, has difficulty gripping and opening objects, still undecided re spine surgery

## 2020-01-24 NOTE — Patient Instructions (Addendum)
F/U in office with mD first week in [place] if you need me sooner  You are referred for PT/OT for left hand and leg weakness, twice weekly for 4 weeks  Please get fasting lipid, cmp and EGFr, HBA1C, uric acid level 3 to 5 days before your March appointment  Be careful not to fall, use your cane and keep rooms clutter free and well lit  I do think that surgery on your spine will improve the strength in you left hand and leg  Best for 2022!  Thanks for choosing [facility], we consider it a privelige to serve you.

## 2020-01-24 NOTE — Assessment & Plan Note (Signed)
Controlled, no change in medication DASH diet and commitment to daily physical activity for a minimum of 30 minutes discussed and encouraged, as a part of hypertension management. The importance of attaining a healthy weight is also discussed.  BP/Weight 01/24/2020 01/12/2020 11/04/2019 11/04/2019 11/03/2019 10/20/2019 17/0/0174  Systolic BP 944 967 591 638 - 466 599  Diastolic BP 60 74 70 67 - 72 63  Wt. (Lbs) 178 179.4 181.04 - 183 183 -  BMI 31.53 31.78 32.07 - 32.42 32.42 -

## 2020-01-24 NOTE — Assessment & Plan Note (Signed)
Hyperlipidemia:Low fat diet discussed and encouraged.   Lipid Panel  Lab Results  Component Value Date   CHOL 165 10/06/2019   HDL 54 10/06/2019   LDLCALC 94 10/06/2019   TRIG 83 10/06/2019   CHOLHDL 3.1 10/06/2019   Controlled, no change in medication Updated lab needed at/ before next visit.   ]

## 2020-01-24 NOTE — Progress Notes (Signed)
Virtual Visit via Telephone Note  I connected with Lisa Mann on 01/24/20 at  8:20 AM EST by telephone and verified that I am speaking with the correct person using two identifiers.  Location: Patient: home Provider: work   I discussed the limitations, risks, security and privacy concerns of performing an evaluation and management service by telephone and the availability of in person appointments. I also discussed with the patient that there may be a patient responsible charge related to this service. The patient expressed understanding and agreed to proceed.   History of Present Illness:   c/o increasing weakness in left leg and left hand, difficulty opening objects and at times drags foot Upper endo on 01/19 and 01/27/2020, re thryroid gland, and has appt with neurosurgeon on 01/25/20211 Denies polyuria, polydipsia, blurred vision , or hypoglycemic episodes. Denies recent fever or chills.c/o excessive fatigue a time Denies sinus pressure, nasal congestion, ear pain or sore throat. Denies chest congestion, productive cough or wheezing. Denies chest pains, palpitations and leg swelling Denies abdominal pain, nausea, vomiting,diarrhea or constipation.   Denies dysuria, frequency, hesitancy or incontinence.  Denies headaches, seizures, numbness, or tingling. Denies depression, anxiety sometimes has mild insomnia. Denies skin break down or rash.     Observations/Objective:  BP 118/60   Pulse 83   Ht 5\' 3"  (1.6 m)   Wt 178 lb (80.7 kg)   BMI 31.53 kg/m  Good communication with no confusion and intact memory. Alert and oriented x 3 No signs of respiratory distress during speech   Assessment and Plan: Unsteady gait Progressively worsening, c/o left leg weakness, uses cane all the time, still deciding on spine surgery, refer to PT   Abnormal TSH Being evaluated by Endo for subclinical hyperthyroidism  Diabetes mellitus with neuropathy Controlled, no change in  medication Lisa Mann is reminded of the importance of commitment to daily physical activity for 30 minutes or more, as able and the need to limit carbohydrate intake to 30 to 60 grams per meal to help with blood sugar control.   The need to take medication as prescribed, test blood sugar as directed, and to call between visits if there is a concern that blood sugar is uncontrolled is also discussed.   Lisa Mann is reminded of the importance of daily foot exam, annual eye examination, and good blood sugar, blood pressure and cholesterol control.  Diabetic Labs Latest Ref Rng & Units 10/14/2019 10/06/2019 09/22/2019 08/24/2019 08/24/2019  HbA1c 4.0 - 5.6 % - - - 6.6(A) 6.6  Microalbumin Not Estab. ug/mL - - - - -  Micro/Creat Ratio 0 - 29 mg/g creat - - - - -  Chol 0 - 200 mg/dL - 165 153 - -  HDL >40 mg/dL - 54 53 - -  Calc LDL 0 - 99 mg/dL - 94 82 - -  Triglycerides <150 mg/dL - 83 89 - -  Creatinine 0.44 - 1.00 mg/dL 0.63 0.64 0.62 - -   BP/Weight 01/24/2020 01/12/2020 11/04/2019 11/04/2019 11/03/2019 10/20/2019 83/01/5174  Systolic BP 160 737 106 269 - 485 462  Diastolic BP 60 74 70 67 - 72 63  Wt. (Lbs) 178 179.4 181.04 - 183 183 -  BMI 31.53 31.78 32.07 - 32.42 32.42 -   Foot/eye exam completion dates Latest Ref Rng & Units 11/24/2019 07/08/2019  Eye Exam No Retinopathy No Retinopathy -  Foot exam Order - - -  Foot Form Completion - - Done   Updated lab needed at/ before next visit.  Hyperlipidemia with target LDL less than 100 Hyperlipidemia:Low fat diet discussed and encouraged.   Lipid Panel  Lab Results  Component Value Date   CHOL 165 10/06/2019   HDL 54 10/06/2019   LDLCALC 94 10/06/2019   TRIG 83 10/06/2019   CHOLHDL 3.1 10/06/2019   Controlled, no change in medication Updated lab needed at/ before next visit.   ]  Obesity (BMI 30.0-34.9)  Patient re-educated about  the importance of commitment to a  minimum of 150 minutes of exercise per week as  able.  The importance of healthy food choices with portion control discussed, as well as eating regularly and within a 12 hour window most days. The need to choose "clean , green" food 50 to 75% of the time is discussed, as well as to make water the primary drink and set a goal of 64 ounces water daily.    Weight /BMI 01/24/2020 01/12/2020 11/04/2019  WEIGHT 178 lb 179 lb 6.4 oz 181 lb 0.6 oz  HEIGHT 5\' 3"  5\' 3"  5\' 3"   BMI 31.53 kg/m2 31.78 kg/m2 32.07 kg/m2      Left hand weakness Reports worsening weakness , requests OT, has difficulty gripping and opening objects, still undecided re spine surgery  HTN, goal below 130/80 Controlled, no change in medication DASH diet and commitment to daily physical activity for a minimum of 30 minutes discussed and encouraged, as a part of hypertension management. The importance of attaining a healthy weight is also discussed.  BP/Weight 01/24/2020 01/12/2020 11/04/2019 11/04/2019 11/03/2019 10/20/2019 02/11/8525  Systolic BP 782 423 536 144 - 315 400  Diastolic BP 60 74 70 67 - 72 63  Wt. (Lbs) 178 179.4 181.04 - 183 183 -  BMI 31.53 31.78 32.07 - 32.42 32.42 -          Follow Up Instructions:    I discussed the assessment and treatment plan with the patient. The patient was provided an opportunity to ask questions and all were answered. The patient agreed with the plan and demonstrated an understanding of the instructions.   The patient was advised to call back or seek an in-person evaluation if the symptoms worsen or if the condition fails to improve as anticipated.  I provided 22 minutes of non-face-to-face time during this encounter.   [name], MD

## 2020-01-24 NOTE — Assessment & Plan Note (Addendum)
Progressively worsening, c/o left leg weakness, uses cane all the time, still deciding on spine surgery, refer to PT

## 2020-01-24 NOTE — Addendum Note (Signed)
Addended by: Eual Fines on: 01/24/2020 09:37 AM   Modules accepted: Orders

## 2020-01-25 ENCOUNTER — Telehealth: Payer: Self-pay | Admitting: "Endocrinology

## 2020-01-25 NOTE — Telephone Encounter (Signed)
Pt called after hour line in regards to her test she has sch at [facility] the next couple days. She has questions and would like to speak to nurse.

## 2020-01-25 NOTE — Telephone Encounter (Signed)
Discussed with pt not to eat four hours prior to her initial visit for her uptake and scan, understanding voiced per pt.

## 2020-01-26 ENCOUNTER — Encounter (HOSPITAL_COMMUNITY)
Admission: RE | Admit: 2020-01-26 | Discharge: 2020-01-26 | Disposition: A | Discharge status: Home / Self Care | Payer: Medicare Other | Source: Ambulatory Visit | Attending: "Endocrinology | Admitting: "Endocrinology

## 2020-01-26 DIAGNOSIS — E049 Nontoxic goiter, unspecified: Secondary | ICD-10-CM | POA: Diagnosis not present

## 2020-01-26 DIAGNOSIS — E0590 Thyrotoxicosis, unspecified without thyrotoxic crisis or storm: Secondary | ICD-10-CM | POA: Diagnosis not present

## 2020-01-26 MED ORDER — SODIUM IODIDE I-123 7.4 MBQ CAPS
464.0000 | ORAL_CAPSULE | Freq: Once | ORAL | Status: AC
  Administered 2020-01-26: 464 via ORAL

## 2020-01-27 ENCOUNTER — Encounter (HOSPITAL_COMMUNITY)
Admission: RE | Admit: 2020-01-27 | Discharge: 2020-01-27 | Disposition: A | Discharge status: Home / Self Care | Payer: Medicare Other | Source: Ambulatory Visit | Attending: "Endocrinology | Admitting: "Endocrinology

## 2020-01-27 DIAGNOSIS — E041 Nontoxic single thyroid nodule: Secondary | ICD-10-CM | POA: Diagnosis not present

## 2020-01-27 DIAGNOSIS — E0590 Thyrotoxicosis, unspecified without thyrotoxic crisis or storm: Secondary | ICD-10-CM | POA: Diagnosis not present

## 2020-01-27 DIAGNOSIS — Z853 Personal history of malignant neoplasm of breast: Secondary | ICD-10-CM | POA: Diagnosis not present

## 2020-02-01 ENCOUNTER — Encounter: Payer: Self-pay | Admitting: "Endocrinology

## 2020-02-01 ENCOUNTER — Ambulatory Visit: Payer: Medicare Other | Admitting: "Endocrinology

## 2020-02-01 ENCOUNTER — Other Ambulatory Visit: Payer: Self-pay

## 2020-02-01 VITALS — BP 130/72 | HR 88 | Ht 63.0 in | Wt 178.4 lb

## 2020-02-01 DIAGNOSIS — E0510 Thyrotoxicosis with toxic single thyroid nodule without thyrotoxic crisis or storm: Secondary | ICD-10-CM | POA: Diagnosis not present
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
## 2020-02-02 ENCOUNTER — Telehealth: Payer: Self-pay

## 2020-02-02 NOTE — Telephone Encounter (Signed)
States Dr Dorris Fetch has her scheduled for a scan at the hospital and she doesn't want to do it until she speaks with you to make sure she needs to do it. States to please call you when you have time

## 2020-02-02 NOTE — Telephone Encounter (Signed)
Please let her know that I recommend getting the treatment that Dr Dorris Fetch scheduled , I reviewed the record, thanks!

## 2020-02-03 NOTE — Telephone Encounter (Signed)
Discussed with and answered pt's questions about her upcoming radioactive iodine therapy treatment. I also mailed her a copy of safety instructions.

## 2020-02-03 NOTE — Telephone Encounter (Signed)
States she got a letter from her insurance stating they wouldn't pay and she doesn't have the money to pay out of pocket. Will bring letter by and show to [facility] to see if that is the case

## 2020-02-03 NOTE — Telephone Encounter (Signed)
Called Lisa Mann, as she did drop off a form from the insurance company approving code [id number] from 01-18-20 to 03-03-20.  I told that this was only a Authorization and any time a CT, MRI, Nuclear Medication was ordered that an authorization had to be completed, and the insurance company gives the provider 30 days to get it done or they have to start the process again.  Lisa Ajna stated she was scared as they told her she had to be isolated. I told her that was from the Nuclear medication that was being done, and that I would call Dr Emi Holes office and have Joy reach out to her to discuss the test more, as I didn't know much about it.  I did advise Lisa Regla that Dr Moshe Cipro did agree with Dr Dorris Fetch recommendations.

## 2020-02-07 ENCOUNTER — Telehealth: Payer: Self-pay

## 2020-02-07 NOTE — Telephone Encounter (Signed)
Left a message requesting pt return a call to the office. 

## 2020-02-08 NOTE — Telephone Encounter (Signed)
Called pt back to make sure she had been notified about her appointment time with NM and to answer any questions. Pt stated she was aware, did not have any questions.

## 2020-02-08 NOTE — Telephone Encounter (Signed)
Pt returning your call. [phone]

## 2020-02-15 ENCOUNTER — Encounter (HOSPITAL_COMMUNITY)
Admission: RE | Admit: 2020-02-15 | Discharge: 2020-02-15 | Disposition: A | Discharge status: Home / Self Care | Payer: Medicare Other | Source: Ambulatory Visit | Attending: "Endocrinology | Admitting: "Endocrinology

## 2020-02-15 ENCOUNTER — Other Ambulatory Visit: Payer: Self-pay

## 2020-02-15 DIAGNOSIS — E0510 Thyrotoxicosis with toxic single thyroid nodule without thyrotoxic crisis or storm: Secondary | ICD-10-CM | POA: Insufficient documentation

## 2020-02-15 DIAGNOSIS — D352 Benign neoplasm of pituitary gland: Secondary | ICD-10-CM | POA: Diagnosis not present

## 2020-02-15 MED ORDER — SODIUM IODIDE I 131 CAPSULE
28.5000 | Freq: Once | INTRAVENOUS | Status: AC | PRN
  Administered 2020-02-15: 28.5 via ORAL

## 2020-02-22 DIAGNOSIS — M79672 Pain in left foot: Secondary | ICD-10-CM | POA: Diagnosis not present

## 2020-02-22 DIAGNOSIS — M79671 Pain in right foot: Secondary | ICD-10-CM | POA: Diagnosis not present

## 2020-02-22 DIAGNOSIS — L110 Acquired keratosis follicularis: Secondary | ICD-10-CM | POA: Diagnosis not present

## 2020-02-22 DIAGNOSIS — I739 Peripheral vascular disease, unspecified: Secondary | ICD-10-CM | POA: Diagnosis not present

## 2020-02-22 DIAGNOSIS — E1140 Type 2 diabetes mellitus with diabetic neuropathy, unspecified: Secondary | ICD-10-CM | POA: Diagnosis not present

## 2020-02-22 DIAGNOSIS — E1151 Type 2 diabetes mellitus with diabetic peripheral angiopathy without gangrene: Secondary | ICD-10-CM | POA: Diagnosis not present

## 2020-03-13 ENCOUNTER — Encounter: Payer: Self-pay | Admitting: Family Medicine

## 2020-03-13 ENCOUNTER — Other Ambulatory Visit: Payer: Self-pay

## 2020-03-13 ENCOUNTER — Telehealth (INDEPENDENT_AMBULATORY_CARE_PROVIDER_SITE_OTHER): Payer: Medicare Other | Admitting: Family Medicine

## 2020-03-13 VITALS — Ht 63.0 in | Wt 172.0 lb

## 2020-03-13 DIAGNOSIS — Z9013 Acquired absence of bilateral breasts and nipples: Secondary | ICD-10-CM

## 2020-03-13 DIAGNOSIS — E785 Hyperlipidemia, unspecified: Secondary | ICD-10-CM | POA: Diagnosis not present

## 2020-03-13 DIAGNOSIS — R2681 Unsteadiness on feet: Secondary | ICD-10-CM

## 2020-03-13 DIAGNOSIS — F329 Major depressive disorder, single episode, unspecified: Secondary | ICD-10-CM | POA: Insufficient documentation

## 2020-03-13 DIAGNOSIS — M109 Gout, unspecified: Secondary | ICD-10-CM | POA: Diagnosis not present

## 2020-03-13 DIAGNOSIS — E1140 Type 2 diabetes mellitus with diabetic neuropathy, unspecified: Secondary | ICD-10-CM | POA: Diagnosis not present

## 2020-03-13 DIAGNOSIS — E669 Obesity, unspecified: Secondary | ICD-10-CM | POA: Diagnosis not present

## 2020-03-13 MED ORDER — UNABLE TO FIND: 0 refills | Status: DC

## 2020-03-13 MED ORDER — MONTELUKAST SODIUM 10 MG PO TABS: 10.0000 mg | ORAL_TABLET | Freq: Every day | ORAL | 3 refills | Status: DC

## 2020-03-13 NOTE — Assessment & Plan Note (Signed)
Hyperlipidemia:Low fat diet discussed and encouraged.   Lipid Panel  Lab Results  Component Value Date   CHOL 165 10/06/2019   HDL 54 10/06/2019   LDLCALC 94 10/06/2019   TRIG 83 10/06/2019   CHOLHDL 3.1 10/06/2019     Updated lab needed at/ before next visit.

## 2020-03-13 NOTE — Addendum Note (Signed)
Addended by: Eual Fines on: 03/13/2020 03:26 PM   Modules accepted: Orders

## 2020-03-13 NOTE — Assessment & Plan Note (Signed)
Behavioral  Modification  And review in 3 months

## 2020-03-13 NOTE — Assessment & Plan Note (Signed)
Lisa Mann is reminded of the importance of commitment to daily physical activity for 30 minutes or more, as able and the need to limit carbohydrate intake to 30 to 60 grams per meal to help with blood sugar control.   The need to take medication as prescribed, test blood sugar as directed, and to call between visits if there is a concern that blood sugar is uncontrolled is also discussed.   Lisa Mann is reminded of the importance of daily foot exam, annual eye examination, and good blood sugar, blood pressure and cholesterol control. Updated lab needed at/ before next visit.   Diabetic Labs Latest Ref Rng & Units 10/14/2019 10/06/2019 09/22/2019 08/24/2019 08/24/2019  HbA1c 4.0 - 5.6 % - - - 6.6(A) 6.6  Microalbumin Not Estab. ug/mL - - - - -  Micro/Creat Ratio 0 - 29 mg/g creat - - - - -  Chol 0 - 200 mg/dL - 165 153 - -  HDL >40 mg/dL - 54 53 - -  Calc LDL 0 - 99 mg/dL - 94 82 - -  Triglycerides <150 mg/dL - 83 89 - -  Creatinine 0.44 - 1.00 mg/dL 0.63 0.64 0.62 - -   BP/Weight 03/13/2020 02/01/2020 01/24/2020 01/12/2020 11/04/2019 11/04/2019 82/95/6213  Systolic BP - 086 578 469 629 528 -  Diastolic BP - 72 60 74 70 67 -  Wt. (Lbs) 172 178.4 178 179.4 181.04 - 183  BMI 30.47 31.6 31.53 31.78 32.07 - 32.42   Foot/eye exam completion dates Latest Ref Rng & Units 11/24/2019 07/08/2019  Eye Exam No Retinopathy No Retinopathy -  Foot exam Order - - -  Foot Form Completion - - Done

## 2020-03-13 NOTE — Assessment & Plan Note (Signed)
Reports increased difficulty with safe ambulation, stumbles and has to hold on to the wall, refer for twice weekly pT x 6 weeks, home safety reviewe

## 2020-03-13 NOTE — Assessment & Plan Note (Signed)
  Patient re-educated about  the importance of commitment to a  minimum of 150 minutes of exercise per week as able.  The importance of healthy food choices with portion control discussed, as well as eating regularly and within a 12 hour window most days. The need to choose "clean , green" food 50 to 75% of the time is discussed, as well as to make water the primary drink and set a goal of 64 ounces water daily.    Weight /BMI 03/13/2020 02/01/2020 01/24/2020  WEIGHT 172 lb 178 lb 6.4 oz 178 lb  HEIGHT 5\' 3"  5\' 3"  5\' 3"   BMI 30.47 kg/m2 31.6 kg/m2 31.53 kg/m2

## 2020-03-13 NOTE — Progress Notes (Signed)
Virtual Visit via Telephone Note  I connected with Lisa Mann on 03/13/20 at 11:00 AM EST by telephone and verified that I am speaking with the correct person using two identifiers.  Location: Patient: home Provider: office   I discussed the limitations, risks, security and privacy concerns of performing an evaluation and management service by telephone and the availability of in person appointments. I also discussed with the patient that there may be a patient responsible charge related to this service. The patient expressed understanding and agreed to proceed.   History of Present Illness: F/U chronic problems and address any new or current concerns. Review and update medications and allergies. Review recent lab and radiologic data . Update routine health maintainace. Review an encourage improved health habits to include nutrition, exercise and  sleep . Left sided weakness and unsteady gait, has near falls, has to hold wall  Denies recent fever or chills. Denies sinus pressure, nasal congestion, ear pain or sore throat. Denies chest congestion, productive cough or wheezing. Denies chest pains, palpitations and leg swelling Denies abdominal pain, nausea, vomiting,diarrhea or constipation.   Denies dysuria, frequency, hesitancy or incontinence. Denies joint pain, swelling and limitation in mobility. Denies headaches, seizures, numbness, or tingling. Denies depression, anxiety or insomnia. Denies skin break down or rash. Denies polyuria, polydipsia, blurred vision , or hypoglycemic episodes.       Observations/Objective: Ht 5\' 3"  (1.6 m)   Wt 172 lb (78 kg)   BMI 30.47 kg/m  Good communication with no confusion and intact memory. Alert and oriented x 3 No signs of respiratory distress during speech    Assessment and Plan:  Unsteady gait when walking Reports increased difficulty with safe ambulation, stumbles and has to hold on to the wall, refer for twice weekly pT  x 6 weeks, home safety reviewe  Hyperlipidemia with target LDL less than 100 Hyperlipidemia:Low fat diet discussed and encouraged.   Lipid Panel  Lab Results  Component Value Date   CHOL 165 10/06/2019   HDL 54 10/06/2019   LDLCALC 94 10/06/2019   TRIG 83 10/06/2019   CHOLHDL 3.1 10/06/2019     Updated lab needed at/ before next visit.   Diabetes mellitus with neuropathy Lisa Mann is reminded of the importance of commitment to daily physical activity for 30 minutes or more, as able and the need to limit carbohydrate intake to 30 to 60 grams per meal to help with blood sugar control.   The need to take medication as prescribed, test blood sugar as directed, and to call between visits if there is a concern that blood sugar is uncontrolled is also discussed.   Lisa Mann is reminded of the importance of daily foot exam, annual eye examination, and good blood sugar, blood pressure and cholesterol control. Updated lab needed at/ before next visit.   Diabetic Labs Latest Ref Rng & Units 10/14/2019 10/06/2019 09/22/2019 08/24/2019 08/24/2019  HbA1c 4.0 - 5.6 % - - - 6.6(A) 6.6  Microalbumin Not Estab. ug/mL - - - - -  Micro/Creat Ratio 0 - 29 mg/g creat - - - - -  Chol 0 - 200 mg/dL - 165 153 - -  HDL >40 mg/dL - 54 53 - -  Calc LDL 0 - 99 mg/dL - 94 82 - -  Triglycerides <150 mg/dL - 83 89 - -  Creatinine 0.44 - 1.00 mg/dL 0.63 0.64 0.62 - -   BP/Weight 03/13/2020 02/01/2020 01/24/2020 01/12/2020 11/04/2019 11/04/2019 67/20/9470  Systolic BP - 962  118 133 622 297 -  Diastolic BP - 72 60 74 70 67 -  Wt. (Lbs) 172 178.4 178 179.4 181.04 - 183  BMI 30.47 31.6 31.53 31.78 32.07 - 32.42   Foot/eye exam completion dates Latest Ref Rng & Units 11/24/2019 07/08/2019  Eye Exam No Retinopathy No Retinopathy -  Foot exam Order - - -  Foot Form Completion - - Done        Obesity (BMI 30.0-34.9)  Patient re-educated about  the importance of commitment to a  minimum of 150 minutes of  exercise per week as able.  The importance of healthy food choices with portion control discussed, as well as eating regularly and within a 12 hour window most days. The need to choose "clean , green" food 50 to 75% of the time is discussed, as well as to make water the primary drink and set a goal of 64 ounces water daily.    Weight /BMI 03/13/2020 02/01/2020 01/24/2020  WEIGHT 172 lb 178 lb 6.4 oz 178 lb  HEIGHT 5\' 3"  5\' 3"  5\' 3"   BMI 30.47 kg/m2 31.6 kg/m2 31.53 kg/m2      Gout Need updated uric acid level , no recent flares  Depression, reactive Behavioral  Modification  And review in 3 months   Follow Up Instructions:    I discussed the assessment and treatment plan with the patient. The patient was provided an opportunity to ask questions and all were answered. The patient agreed with the plan and demonstrated an understanding of the instructions.   The patient was advised to call back or seek an in-person evaluation if the symptoms worsen or if the condition fails to improve as anticipated.  I provided 22 minutes of non-face-to-face time during this encounter.   [name], MD

## 2020-03-13 NOTE — Assessment & Plan Note (Signed)
Need updated uric acid level , no recent flares

## 2020-03-13 NOTE — Patient Instructions (Addendum)
F/U in 3 months, re evaluate mild depression, call if you need me sooner  Please reach out to community , friends and family resources as we discussed to reduce your loneliness and mild depression and improve the quality of your life  I still encourage  You to get the surgery recommended  You are referred to PT twice weekly for 6 weeks, due to unsteady gait   Please get labs ordered by me on 01/24/2020 later this week  Be careful not to fall  Thanks for choosing [facility], we consider it a privelige to serve you.

## 2020-03-14 ENCOUNTER — Telehealth: Payer: Self-pay

## 2020-03-14 DIAGNOSIS — G959 Disease of spinal cord, unspecified: Secondary | ICD-10-CM | POA: Diagnosis not present

## 2020-03-14 DIAGNOSIS — M532X1 Spinal instabilities, occipito-atlanto-axial region: Secondary | ICD-10-CM | POA: Diagnosis not present

## 2020-03-14 NOTE — Telephone Encounter (Signed)
Pts daughter is calling, wants to know if Dr Moshe Cipro will call her regarding her mother and the surgery    Conley Pawling (daughter): [phone]. Santiago Glad in listed on the [facility]

## 2020-03-14 NOTE — Telephone Encounter (Signed)
Call put through at 5 17 pm and I left a message that I will attempt in the am as Ms Braulio Conte d was unable to/ did not answer

## 2020-03-15 DIAGNOSIS — E1140 Type 2 diabetes mellitus with diabetic neuropathy, unspecified: Secondary | ICD-10-CM | POA: Diagnosis not present

## 2020-03-15 DIAGNOSIS — E790 Hyperuricemia without signs of inflammatory arthritis and tophaceous disease: Secondary | ICD-10-CM | POA: Diagnosis not present

## 2020-03-15 DIAGNOSIS — E785 Hyperlipidemia, unspecified: Secondary | ICD-10-CM | POA: Diagnosis not present

## 2020-03-15 NOTE — Telephone Encounter (Signed)
Attempting to call Santiago Glad again, have to leave a message she cannot answer again, I left a message. Please schedule a phone visit time during my work day that is convenient for her to answer the phone, thanks It does not need to be a visit but need a time allocated for this

## 2020-03-15 NOTE — Telephone Encounter (Signed)
Direct contact finally made with Santiago Glad I advised she speak directly with N Neurosurgeon to get a full understanding of her Mom's need for surgery, recovery time etc. She has not spoken with Neurosurgeon at all as yet States she will take time off from work to do so

## 2020-03-16 LAB — CMP14+EGFR
ALT: 18 IU/L (ref 0–32)
AST: 15 IU/L (ref 0–40)
Albumin/Globulin Ratio: 1.6 (ref 1.2–2.2)
Albumin: 4.5 g/dL (ref 3.6–4.6)
Alkaline Phosphatase: 61 IU/L (ref 44–121)
BUN/Creatinine Ratio: 25 (ref 12–28)
BUN: 17 mg/dL (ref 8–27)
Bilirubin Total: 0.4 mg/dL (ref 0.0–1.2)
CO2: 26 mmol/L (ref 20–29)
Calcium: 10.3 mg/dL (ref 8.7–10.3)
Chloride: 96 mmol/L (ref 96–106)
Creatinine, Ser: 0.69 mg/dL (ref 0.57–1.00)
Globulin, Total: 2.8 g/dL (ref 1.5–4.5)
Glucose: 117 mg/dL — ABNORMAL HIGH (ref 65–99)
Potassium: 4.6 mmol/L (ref 3.5–5.2)
Sodium: 140 mmol/L (ref 134–144)
Total Protein: 7.3 g/dL (ref 6.0–8.5)
eGFR: 86 mL/min/{1.73_m2} (ref >59)

## 2020-03-16 LAB — LIPID PANEL
Chol/HDL Ratio: 3.4 ratio (ref 0.0–4.4)
Cholesterol, Total: 172 mg/dL (ref 100–199)
HDL: 50 mg/dL (ref >39)
LDL Chol Calc (NIH): 103 mg/dL — ABNORMAL HIGH (ref 0–99)
Triglycerides: 103 mg/dL (ref 0–149)
VLDL Cholesterol Cal: 19 mg/dL (ref 5–40)

## 2020-03-16 LAB — HEMOGLOBIN A1C
Est. average glucose Bld gHb Est-mCnc: 146 mg/dL
Hgb A1c MFr Bld: 6.7 % — ABNORMAL HIGH (ref 4.8–5.6)

## 2020-03-16 LAB — URIC ACID: Uric Acid: 2.6 mg/dL — ABNORMAL LOW (ref 3.1–7.9)

## 2020-03-17 ENCOUNTER — Other Ambulatory Visit: Payer: Self-pay | Admitting: Family Medicine

## 2020-03-17 MED ORDER — ALLOPURINOL 100 MG PO TABS: 100.0000 mg | ORAL_TABLET | Freq: Every day | ORAL | 1 refills | Status: DC

## 2020-03-20 DIAGNOSIS — C50912 Malignant neoplasm of unspecified site of left female breast: Secondary | ICD-10-CM | POA: Diagnosis not present

## 2020-03-20 DIAGNOSIS — C50911 Malignant neoplasm of unspecified site of right female breast: Secondary | ICD-10-CM | POA: Diagnosis not present

## 2020-03-22 ENCOUNTER — Other Ambulatory Visit: Payer: Self-pay | Admitting: Neurosurgery

## 2020-03-28 DIAGNOSIS — E0510 Thyrotoxicosis with toxic single thyroid nodule without thyrotoxic crisis or storm: Secondary | ICD-10-CM | POA: Diagnosis not present

## 2020-03-29 LAB — TSH: TSH: 0.897 u[IU]/mL (ref 0.450–4.500)

## 2020-03-29 LAB — T4, FREE: Free T4: 1.07 ng/dL (ref 0.82–1.77)

## 2020-03-31 ENCOUNTER — Other Ambulatory Visit: Payer: Self-pay | Admitting: Family Medicine

## 2020-04-04 ENCOUNTER — Ambulatory Visit: Payer: Medicare Other | Admitting: "Endocrinology

## 2020-04-04 ENCOUNTER — Encounter: Payer: Self-pay | Admitting: "Endocrinology

## 2020-04-04 ENCOUNTER — Other Ambulatory Visit: Payer: Self-pay

## 2020-04-04 VITALS — BP 118/56 | HR 84 | Ht 63.0 in | Wt 175.4 lb

## 2020-04-04 DIAGNOSIS — E0510 Thyrotoxicosis with toxic single thyroid nodule without thyrotoxic crisis or storm: Secondary | ICD-10-CM
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
## 2020-04-11 ENCOUNTER — Telehealth: Payer: Self-pay

## 2020-04-11 ENCOUNTER — Telehealth: Payer: Self-pay | Admitting: Family Medicine

## 2020-04-11 NOTE — Telephone Encounter (Signed)
Called [facility] office and left message. Also spoke with Santiago Glad and she doesn't have the list with her, she is currently at work.

## 2020-04-11 NOTE — Telephone Encounter (Signed)
Does she need appt ?

## 2020-04-11 NOTE — Telephone Encounter (Signed)
Santiago Glad (daughter) called please return her call patient needs to know what meds to stop taking for her surgery next week.  Needs to know today so she can stop these meds tomorrow. Please call Santiago Glad (daughter) cell # [phone] or contact patient at home [phone].

## 2020-04-11 NOTE — Telephone Encounter (Signed)
I SPOKE WITH DAUGHTER , KAREN, STATES SURGERY IS NOT DENIED, NEEDS TO BE ADVISED ON MEDS TO BE STOPPED  PER NEUROSURGERY 1 WEEK PRIOR TO SURGERY PLEASE CALL DR Arnoldo Morale OFFICE TO SEE WHAT MEDS RECOMMEND STOPPING AND WILL NEED TO CONTACT kAREN HER DAUGHTER AND ADVISE HER TO STOP ONCE I REVIEW WITH YOU  HIGH PRIORITY PLEASE SURGERY INS 4/13 KAREN'S # IS [phone]

## 2020-04-11 NOTE — Telephone Encounter (Signed)
She probably needs an appt. Make sure she brings the med list that the surgeon recommended her stop taking.

## 2020-04-11 NOTE — Telephone Encounter (Signed)
Spoke with Janett Billow and she is faxing this list now.

## 2020-04-12 ENCOUNTER — Telehealth: Payer: Self-pay

## 2020-04-12 NOTE — Telephone Encounter (Signed)
error 

## 2020-04-12 NOTE — Telephone Encounter (Signed)
Dr. Posey Pronto reviewed med list and okayed her to stop Aspirin. Pt daughter informed.

## 2020-04-17 ENCOUNTER — Other Ambulatory Visit: Payer: Self-pay | Admitting: Family Medicine

## 2020-04-17 ENCOUNTER — Other Ambulatory Visit (HOSPITAL_COMMUNITY)
Admission: RE | Admit: 2020-04-17 | Discharge: 2020-04-17 | Disposition: A | Discharge status: Home / Self Care | Payer: Medicare Other | Source: Ambulatory Visit | Attending: Neurosurgery | Admitting: Neurosurgery

## 2020-04-17 DIAGNOSIS — E669 Obesity, unspecified: Secondary | ICD-10-CM | POA: Diagnosis present

## 2020-04-17 DIAGNOSIS — Z888 Allergy status to other drugs, medicaments and biological substances status: Secondary | ICD-10-CM | POA: Diagnosis not present

## 2020-04-17 DIAGNOSIS — I10 Essential (primary) hypertension: Secondary | ICD-10-CM | POA: Diagnosis not present

## 2020-04-17 DIAGNOSIS — Z79899 Other long term (current) drug therapy: Secondary | ICD-10-CM | POA: Diagnosis not present

## 2020-04-17 DIAGNOSIS — Z01812 Encounter for preprocedural laboratory examination: Secondary | ICD-10-CM | POA: Insufficient documentation

## 2020-04-17 DIAGNOSIS — E785 Hyperlipidemia, unspecified: Secondary | ICD-10-CM | POA: Diagnosis not present

## 2020-04-17 DIAGNOSIS — Z981 Arthrodesis status: Secondary | ICD-10-CM | POA: Diagnosis not present

## 2020-04-17 DIAGNOSIS — M532X1 Spinal instabilities, occipito-atlanto-axial region: Secondary | ICD-10-CM | POA: Diagnosis not present

## 2020-04-17 DIAGNOSIS — Z20822 Contact with and (suspected) exposure to covid-19: Secondary | ICD-10-CM | POA: Insufficient documentation

## 2020-04-17 DIAGNOSIS — E1140 Type 2 diabetes mellitus with diabetic neuropathy, unspecified: Secondary | ICD-10-CM | POA: Diagnosis not present

## 2020-04-17 DIAGNOSIS — M109 Gout, unspecified: Secondary | ICD-10-CM | POA: Diagnosis not present

## 2020-04-17 DIAGNOSIS — Z853 Personal history of malignant neoplasm of breast: Secondary | ICD-10-CM | POA: Diagnosis not present

## 2020-04-17 DIAGNOSIS — M1990 Unspecified osteoarthritis, unspecified site: Secondary | ICD-10-CM | POA: Diagnosis not present

## 2020-04-17 DIAGNOSIS — Z7984 Long term (current) use of oral hypoglycemic drugs: Secondary | ICD-10-CM | POA: Diagnosis not present

## 2020-04-17 DIAGNOSIS — R29898 Other symptoms and signs involving the musculoskeletal system: Secondary | ICD-10-CM | POA: Diagnosis not present

## 2020-04-17 DIAGNOSIS — M5416 Radiculopathy, lumbar region: Secondary | ICD-10-CM | POA: Diagnosis not present

## 2020-04-17 DIAGNOSIS — G992 Myelopathy in diseases classified elsewhere: Secondary | ICD-10-CM | POA: Diagnosis not present

## 2020-04-17 DIAGNOSIS — Z6830 Body mass index (BMI) 30.0-30.9, adult: Secondary | ICD-10-CM | POA: Diagnosis not present

## 2020-04-17 DIAGNOSIS — Z9181 History of falling: Secondary | ICD-10-CM | POA: Diagnosis not present

## 2020-04-17 DIAGNOSIS — M4322 Fusion of spine, cervical region: Secondary | ICD-10-CM | POA: Diagnosis not present

## 2020-04-17 DIAGNOSIS — K219 Gastro-esophageal reflux disease without esophagitis: Secondary | ICD-10-CM | POA: Diagnosis not present

## 2020-04-17 DIAGNOSIS — Z8249 Family history of ischemic heart disease and other diseases of the circulatory system: Secondary | ICD-10-CM | POA: Diagnosis not present

## 2020-04-17 DIAGNOSIS — Z9013 Acquired absence of bilateral breasts and nipples: Secondary | ICD-10-CM | POA: Diagnosis not present

## 2020-04-17 DIAGNOSIS — M4802 Spinal stenosis, cervical region: Secondary | ICD-10-CM | POA: Diagnosis not present

## 2020-04-17 DIAGNOSIS — E119 Type 2 diabetes mellitus without complications: Secondary | ICD-10-CM | POA: Diagnosis not present

## 2020-04-17 DIAGNOSIS — I9751 Accidental puncture and laceration of a circulatory system organ or structure during a circulatory system procedure: Secondary | ICD-10-CM | POA: Diagnosis not present

## 2020-04-17 DIAGNOSIS — Z7982 Long term (current) use of aspirin: Secondary | ICD-10-CM | POA: Diagnosis not present

## 2020-04-17 LAB — SARS CORONAVIRUS 2 (TAT 6-24 HRS): SARS Coronavirus 2: NEGATIVE

## 2020-04-18 ENCOUNTER — Other Ambulatory Visit: Payer: Self-pay

## 2020-04-18 ENCOUNTER — Other Ambulatory Visit: Payer: Self-pay | Admitting: Family Medicine

## 2020-04-18 ENCOUNTER — Encounter (HOSPITAL_COMMUNITY): Payer: Self-pay | Admitting: Neurosurgery

## 2020-04-18 NOTE — Progress Notes (Signed)
Lisa Mann denies chest pain or shortness of breath. Patient was tested for Covid and has been in quarantine since that time.  Lisa Mann has type II diabetes, patient repiorts that CBGs run 109- 110,. I instructed patient to check CBG after awaking and every 2 hours until arrival  to the hospital.  I Instructed patient if CBG is less than 70 to take 4 Glucose Tablets or 1 tube of Glucose Gel or 1/2 cup of a clear juice. Recheck CBG in 15 minutes if CBG is not over 70 call, pre- op desk at [phone] for further instructions.

## 2020-04-18 NOTE — Progress Notes (Addendum)
Pt made aware of surgery time change. Pt advised to arrive at [facility] Entrance "A" at 8:30AM on Wed 04/19/20.

## 2020-04-19 ENCOUNTER — Inpatient Hospital Stay (HOSPITAL_COMMUNITY)
Admission: RE | Admit: 2020-04-19 | Discharge: 2020-04-21 | DRG: 472 | Disposition: A | Discharge status: Home / Self Care | Payer: Medicare Other | Attending: Neurosurgery | Admitting: Neurosurgery

## 2020-04-19 ENCOUNTER — Other Ambulatory Visit: Payer: Self-pay

## 2020-04-19 ENCOUNTER — Encounter (HOSPITAL_COMMUNITY): Payer: Self-pay | Admitting: Neurosurgery

## 2020-04-19 ENCOUNTER — Inpatient Hospital Stay (HOSPITAL_COMMUNITY): Payer: Medicare Other

## 2020-04-19 ENCOUNTER — Encounter (HOSPITAL_COMMUNITY)
Admission: RE | Disposition: A | Discharge status: Home / Self Care | Payer: Self-pay | Source: Home / Self Care | Attending: Neurosurgery

## 2020-04-19 DIAGNOSIS — M109 Gout, unspecified: Secondary | ICD-10-CM | POA: Diagnosis present

## 2020-04-19 DIAGNOSIS — E785 Hyperlipidemia, unspecified: Secondary | ICD-10-CM | POA: Diagnosis not present

## 2020-04-19 DIAGNOSIS — M4802 Spinal stenosis, cervical region: Principal | ICD-10-CM | POA: Diagnosis present

## 2020-04-19 DIAGNOSIS — Z6830 Body mass index (BMI) 30.0-30.9, adult: Secondary | ICD-10-CM

## 2020-04-19 DIAGNOSIS — Z8249 Family history of ischemic heart disease and other diseases of the circulatory system: Secondary | ICD-10-CM | POA: Diagnosis not present

## 2020-04-19 DIAGNOSIS — I10 Essential (primary) hypertension: Secondary | ICD-10-CM | POA: Diagnosis not present

## 2020-04-19 DIAGNOSIS — Z888 Allergy status to other drugs, medicaments and biological substances status: Secondary | ICD-10-CM

## 2020-04-19 DIAGNOSIS — G992 Myelopathy in diseases classified elsewhere: Secondary | ICD-10-CM | POA: Diagnosis not present

## 2020-04-19 DIAGNOSIS — E119 Type 2 diabetes mellitus without complications: Secondary | ICD-10-CM | POA: Diagnosis present

## 2020-04-19 DIAGNOSIS — Z9181 History of falling: Secondary | ICD-10-CM | POA: Diagnosis not present

## 2020-04-19 DIAGNOSIS — Z9013 Acquired absence of bilateral breasts and nipples: Secondary | ICD-10-CM | POA: Diagnosis not present

## 2020-04-19 DIAGNOSIS — Z20822 Contact with and (suspected) exposure to covid-19: Secondary | ICD-10-CM | POA: Diagnosis present

## 2020-04-19 DIAGNOSIS — M532X1 Spinal instabilities, occipito-atlanto-axial region: Secondary | ICD-10-CM | POA: Diagnosis not present

## 2020-04-19 DIAGNOSIS — I9751 Accidental puncture and laceration of a circulatory system organ or structure during a circulatory system procedure: Secondary | ICD-10-CM | POA: Diagnosis not present

## 2020-04-19 DIAGNOSIS — Z981 Arthrodesis status: Secondary | ICD-10-CM | POA: Diagnosis not present

## 2020-04-19 DIAGNOSIS — E669 Obesity, unspecified: Secondary | ICD-10-CM | POA: Diagnosis present

## 2020-04-19 DIAGNOSIS — M4322 Fusion of spine, cervical region: Secondary | ICD-10-CM | POA: Diagnosis not present

## 2020-04-19 DIAGNOSIS — Z853 Personal history of malignant neoplasm of breast: Secondary | ICD-10-CM

## 2020-04-19 DIAGNOSIS — M1990 Unspecified osteoarthritis, unspecified site: Secondary | ICD-10-CM | POA: Diagnosis present

## 2020-04-19 DIAGNOSIS — Z7984 Long term (current) use of oral hypoglycemic drugs: Secondary | ICD-10-CM

## 2020-04-19 DIAGNOSIS — M5416 Radiculopathy, lumbar region: Secondary | ICD-10-CM | POA: Diagnosis not present

## 2020-04-19 DIAGNOSIS — K219 Gastro-esophageal reflux disease without esophagitis: Secondary | ICD-10-CM | POA: Diagnosis not present

## 2020-04-19 DIAGNOSIS — Z7982 Long term (current) use of aspirin: Secondary | ICD-10-CM | POA: Diagnosis not present

## 2020-04-19 DIAGNOSIS — Z79899 Other long term (current) drug therapy: Secondary | ICD-10-CM | POA: Diagnosis not present

## 2020-04-19 DIAGNOSIS — Z419 Encounter for procedure for purposes other than remedying health state, unspecified: Secondary | ICD-10-CM

## 2020-04-19 HISTORY — DX: Personal history of other diseases of the digestive system: Z87.19

## 2020-04-19 HISTORY — PX: POSTERIOR CERVICAL FUSION/FORAMINOTOMY: SHX5038

## 2020-04-19 LAB — CBC
HCT: 37.2 % (ref 36.0–46.0)
Hemoglobin: 12.2 g/dL (ref 12.0–15.0)
MCH: 29.4 pg (ref 26.0–34.0)
MCHC: 32.8 g/dL (ref 30.0–36.0)
MCV: 89.6 fL (ref 80.0–100.0)
Platelets: 291 10*3/uL (ref 150–400)
RBC: 4.15 MIL/uL (ref 3.87–5.11)
RDW: 14.3 % (ref 11.5–15.5)
WBC: 5.4 10*3/uL (ref 4.0–10.5)
nRBC: 0 % (ref 0.0–0.2)

## 2020-04-19 LAB — BASIC METABOLIC PANEL
Anion gap: 6 (ref 5–15)
BUN: 14 mg/dL (ref 8–23)
CO2: 32 mmol/L (ref 22–32)
Calcium: 9.9 mg/dL (ref 8.9–10.3)
Chloride: 101 mmol/L (ref 98–111)
Creatinine, Ser: 0.71 mg/dL (ref 0.44–1.00)
GFR, Estimated: >60 mL/min (ref >60)
Glucose, Bld: 119 mg/dL — ABNORMAL HIGH (ref 70–99)
Potassium: 4.6 mmol/L (ref 3.5–5.1)
Sodium: 139 mmol/L (ref 135–145)

## 2020-04-19 LAB — POCT I-STAT, CHEM 8
BUN: 15 mg/dL (ref 8–23)
Calcium, Ion: 1.26 mmol/L (ref 1.15–1.40)
Chloride: 98 mmol/L (ref 98–111)
Creatinine, Ser: 0.6 mg/dL (ref 0.44–1.00)
Glucose, Bld: 114 mg/dL — ABNORMAL HIGH (ref 70–99)
HCT: 39 % (ref 36.0–46.0)
Hemoglobin: 13.3 g/dL (ref 12.0–15.0)
Potassium: 3.8 mmol/L (ref 3.5–5.1)
Sodium: 139 mmol/L (ref 135–145)
TCO2: 28 mmol/L (ref 22–32)

## 2020-04-19 LAB — SURGICAL PCR SCREEN
MRSA, PCR: NEGATIVE
Staphylococcus aureus: POSITIVE — AB

## 2020-04-19 LAB — TYPE AND SCREEN
ABO/RH(D): B POS
Antibody Screen: NEGATIVE

## 2020-04-19 LAB — GLUCOSE, CAPILLARY
Glucose-Capillary: 120 mg/dL — ABNORMAL HIGH (ref 70–99)
Glucose-Capillary: 162 mg/dL — ABNORMAL HIGH (ref 70–99)
Glucose-Capillary: 145 mg/dL — ABNORMAL HIGH (ref 70–99)

## 2020-04-19 LAB — ABO/RH: ABO/RH(D): B POS

## 2020-04-19 SURGERY — POSTERIOR CERVICAL FUSION/FORAMINOTOMY LEVEL 1
Anesthesia: General

## 2020-04-19 MED ORDER — MUPIROCIN 2 % EX OINT
1.0000 "application " | TOPICAL_OINTMENT | Freq: Two times a day (BID) | CUTANEOUS | Status: DC
  Administered 2020-04-19 – 2020-04-21 (×4): 1 via NASAL
  Filled 2020-04-19 (×2): qty 22

## 2020-04-19 MED ORDER — FENTANYL CITRATE (PF) 100 MCG/2ML IJ SOLN
INTRAMUSCULAR | Status: DC | PRN
  Administered 2020-04-19 (×3): 50 ug via INTRAVENOUS

## 2020-04-19 MED ORDER — ACETAMINOPHEN 650 MG RE SUPP: 650.0000 mg | RECTAL | Status: DC | PRN

## 2020-04-19 MED ORDER — BISACODYL 10 MG RE SUPP
10.0000 mg | Freq: Every day | RECTAL | Status: DC | PRN
Start: 2020-04-19 — End: 2020-04-21

## 2020-04-19 MED ORDER — PANTOPRAZOLE SODIUM 40 MG PO TBEC
40.0000 mg | DELAYED_RELEASE_TABLET | Freq: Every day | ORAL | Status: DC
  Administered 2020-04-19 – 2020-04-21 (×3): 40 mg via ORAL
  Filled 2020-04-19 (×3): qty 1

## 2020-04-19 MED ORDER — BUPIVACAINE-EPINEPHRINE 0.5% -1:200000 IJ SOLN
INTRAMUSCULAR | Status: DC | PRN
  Administered 2020-04-19: 10 mL

## 2020-04-19 MED ORDER — CHLORHEXIDINE GLUCONATE 0.12 % MT SOLN
OROMUCOSAL | Status: AC
  Administered 2020-04-19: 15 mL via OROMUCOSAL
  Filled 2020-04-19: qty 15

## 2020-04-19 MED ORDER — THROMBIN 5000 UNITS EX SOLR
OROMUCOSAL | Status: DC | PRN
  Administered 2020-04-19: 5 mL via TOPICAL

## 2020-04-19 MED ORDER — AZELASTINE HCL 0.1 % NA SOLN: 2.0000 | Freq: Two times a day (BID) | NASAL | Status: DC | PRN

## 2020-04-19 MED ORDER — GABAPENTIN 100 MG PO CAPS
100.0000 mg | ORAL_CAPSULE | Freq: Every evening | ORAL | Status: DC | PRN
  Administered 2020-04-20: 100 mg via ORAL
  Filled 2020-04-19: qty 1

## 2020-04-19 MED ORDER — OXYCODONE HCL 5 MG PO TABS
10.0000 mg | ORAL_TABLET | ORAL | Status: DC | PRN
Start: 2020-04-19 — End: 2020-04-21

## 2020-04-19 MED ORDER — LIDOCAINE 2% (20 MG/ML) 5 ML SYRINGE
INTRAMUSCULAR | Status: AC
  Filled 2020-04-19: qty 5

## 2020-04-19 MED ORDER — PHENYLEPHRINE HCL-NACL 10-0.9 MG/250ML-% IV SOLN
INTRAVENOUS | Status: DC | PRN
  Administered 2020-04-19: 25 ug/min via INTRAVENOUS

## 2020-04-19 MED ORDER — 0.9 % SODIUM CHLORIDE (POUR BTL) OPTIME
TOPICAL | Status: DC | PRN
  Administered 2020-04-19: 1000 mL

## 2020-04-19 MED ORDER — FENTANYL CITRATE (PF) 100 MCG/2ML IJ SOLN
25.0000 ug | INTRAMUSCULAR | Status: DC | PRN
  Administered 2020-04-19: 25 ug via INTRAVENOUS
  Administered 2020-04-19: 50 ug via INTRAVENOUS
  Administered 2020-04-19: 25 ug via INTRAVENOUS

## 2020-04-19 MED ORDER — DEXAMETHASONE SODIUM PHOSPHATE 10 MG/ML IJ SOLN
INTRAMUSCULAR | Status: DC | PRN
  Administered 2020-04-19: 5 mg via INTRAVENOUS

## 2020-04-19 MED ORDER — ALLOPURINOL 300 MG PO TABS: 300.0000 mg | ORAL_TABLET | Freq: Every day | ORAL | Status: DC

## 2020-04-19 MED ORDER — ACETAMINOPHEN 325 MG PO TABS
650.0000 mg | ORAL_TABLET | ORAL | Status: DC | PRN
Start: 2020-04-19 — End: 2020-04-21

## 2020-04-19 MED ORDER — PHENYLEPHRINE 40 MCG/ML (10ML) SYRINGE FOR IV PUSH (FOR BLOOD PRESSURE SUPPORT)
PREFILLED_SYRINGE | INTRAVENOUS | Status: AC
  Filled 2020-04-19: qty 10

## 2020-04-19 MED ORDER — LACTATED RINGERS IV SOLN: INTRAVENOUS | Status: DC

## 2020-04-19 MED ORDER — FENTANYL CITRATE (PF) 250 MCG/5ML IJ SOLN
INTRAMUSCULAR | Status: AC
  Filled 2020-04-19: qty 5

## 2020-04-19 MED ORDER — MONTELUKAST SODIUM 10 MG PO TABS
10.0000 mg | ORAL_TABLET | Freq: Every day | ORAL | Status: DC
  Administered 2020-04-19 – 2020-04-20 (×2): 10 mg via ORAL
  Filled 2020-04-19 (×3): qty 1

## 2020-04-19 MED ORDER — FLUTICASONE PROPIONATE 50 MCG/ACT NA SUSP
2.0000 | Freq: Every day | NASAL | Status: DC
  Administered 2020-04-20: 2 via NASAL
  Filled 2020-04-19: qty 16

## 2020-04-19 MED ORDER — LIDOCAINE 2% (20 MG/ML) 5 ML SYRINGE
INTRAMUSCULAR | Status: DC | PRN
  Administered 2020-04-19: 60 mg via INTRAVENOUS

## 2020-04-19 MED ORDER — CHLORHEXIDINE GLUCONATE CLOTH 2 % EX PADS
6.0000 | MEDICATED_PAD | Freq: Every day | CUTANEOUS | Status: DC
  Administered 2020-04-21: 6 via TOPICAL

## 2020-04-19 MED ORDER — MENTHOL 3 MG MT LOZG
1.0000 | LOZENGE | OROMUCOSAL | Status: DC | PRN
  Filled 2020-04-19: qty 9

## 2020-04-19 MED ORDER — PHENYLEPHRINE 40 MCG/ML (10ML) SYRINGE FOR IV PUSH (FOR BLOOD PRESSURE SUPPORT)
PREFILLED_SYRINGE | INTRAVENOUS | Status: DC | PRN
  Administered 2020-04-19: 80 ug via INTRAVENOUS

## 2020-04-19 MED ORDER — THROMBIN 5000 UNITS EX SOLR
CUTANEOUS | Status: DC | PRN
  Administered 2020-04-19: 10000 [IU] via TOPICAL

## 2020-04-19 MED ORDER — ONDANSETRON HCL 4 MG PO TABS: 4.0000 mg | ORAL_TABLET | Freq: Four times a day (QID) | ORAL | Status: DC | PRN

## 2020-04-19 MED ORDER — DOCUSATE SODIUM 100 MG PO CAPS
100.0000 mg | ORAL_CAPSULE | Freq: Two times a day (BID) | ORAL | Status: DC
  Administered 2020-04-19 – 2020-04-21 (×4): 100 mg via ORAL
  Filled 2020-04-19 (×4): qty 1

## 2020-04-19 MED ORDER — ONDANSETRON HCL 4 MG/2ML IJ SOLN: 4.0000 mg | Freq: Once | INTRAMUSCULAR | Status: DC | PRN

## 2020-04-19 MED ORDER — TRIAMTERENE-HCTZ 75-50 MG PO TABS
1.0000 | ORAL_TABLET | Freq: Every day | ORAL | Status: DC
  Administered 2020-04-20 – 2020-04-21 (×2): 1 via ORAL
  Filled 2020-04-19 (×2): qty 1

## 2020-04-19 MED ORDER — CHLORHEXIDINE GLUCONATE CLOTH 2 % EX PADS: 6.0000 | MEDICATED_PAD | Freq: Once | CUTANEOUS | Status: DC

## 2020-04-19 MED ORDER — CEFAZOLIN SODIUM-DEXTROSE 2-4 GM/100ML-% IV SOLN
2.0000 g | INTRAVENOUS | Status: AC
  Administered 2020-04-19: 2 g via INTRAVENOUS

## 2020-04-19 MED ORDER — ZOLPIDEM TARTRATE 5 MG PO TABS
5.0000 mg | ORAL_TABLET | Freq: Every evening | ORAL | Status: DC | PRN
  Administered 2020-04-20: 5 mg via ORAL
  Filled 2020-04-19: qty 1

## 2020-04-19 MED ORDER — PROPOFOL 10 MG/ML IV BOLUS
INTRAVENOUS | Status: DC | PRN
  Administered 2020-04-19: 50 mg via INTRAVENOUS
  Administered 2020-04-19: 120 mg via INTRAVENOUS

## 2020-04-19 MED ORDER — ORAL CARE MOUTH RINSE
15.0000 mL | Freq: Once | OROMUCOSAL | Status: AC
Start: 2020-04-19 — End: 2020-04-19

## 2020-04-19 MED ORDER — MORPHINE SULFATE (PF) 4 MG/ML IV SOLN: 4.0000 mg | INTRAVENOUS | Status: DC | PRN

## 2020-04-19 MED ORDER — BACITRACIN ZINC 500 UNIT/GM EX OINT
TOPICAL_OINTMENT | CUTANEOUS | Status: AC
  Filled 2020-04-19: qty 28.35

## 2020-04-19 MED ORDER — ROCURONIUM BROMIDE 10 MG/ML (PF) SYRINGE
PREFILLED_SYRINGE | INTRAVENOUS | Status: AC
  Filled 2020-04-19: qty 10

## 2020-04-19 MED ORDER — ONDANSETRON HCL 4 MG/2ML IJ SOLN
INTRAMUSCULAR | Status: AC
  Filled 2020-04-19: qty 2

## 2020-04-19 MED ORDER — THROMBIN 5000 UNITS EX SOLR
CUTANEOUS | Status: AC
  Filled 2020-04-19: qty 15000

## 2020-04-19 MED ORDER — BACITRACIN ZINC 500 UNIT/GM EX OINT
TOPICAL_OINTMENT | CUTANEOUS | Status: DC | PRN
  Administered 2020-04-19: 1 via TOPICAL

## 2020-04-19 MED ORDER — ROCURONIUM BROMIDE 10 MG/ML (PF) SYRINGE
PREFILLED_SYRINGE | INTRAVENOUS | Status: DC | PRN
  Administered 2020-04-19: 20 mg via INTRAVENOUS
  Administered 2020-04-19: 60 mg via INTRAVENOUS
  Administered 2020-04-19: 20 mg via INTRAVENOUS

## 2020-04-19 MED ORDER — HEMOSTATIC AGENTS (NO CHARGE) OPTIME
TOPICAL | Status: DC | PRN
  Administered 2020-04-19: 1 via TOPICAL

## 2020-04-19 MED ORDER — POTASSIUM CHLORIDE CRYS ER 10 MEQ PO TBCR
10.0000 meq | EXTENDED_RELEASE_TABLET | Freq: Two times a day (BID) | ORAL | Status: DC
  Administered 2020-04-19 – 2020-04-21 (×4): 10 meq via ORAL
  Filled 2020-04-19 (×4): qty 1

## 2020-04-19 MED ORDER — CEFAZOLIN SODIUM-DEXTROSE 2-4 GM/100ML-% IV SOLN
INTRAVENOUS | Status: AC
  Filled 2020-04-19: qty 100

## 2020-04-19 MED ORDER — CHLORHEXIDINE GLUCONATE 0.12 % MT SOLN: 15.0000 mL | Freq: Once | OROMUCOSAL | Status: AC

## 2020-04-19 MED ORDER — BUPIVACAINE-EPINEPHRINE 0.5% -1:200000 IJ SOLN
INTRAMUSCULAR | Status: AC
  Filled 2020-04-19: qty 1

## 2020-04-19 MED ORDER — ATORVASTATIN CALCIUM 10 MG PO TABS
10.0000 mg | ORAL_TABLET | Freq: Every day | ORAL | Status: DC
  Administered 2020-04-20 – 2020-04-21 (×2): 10 mg via ORAL
  Filled 2020-04-19 (×2): qty 1

## 2020-04-19 MED ORDER — PHENOL 1.4 % MT LIQD: 1.0000 | OROMUCOSAL | Status: DC | PRN

## 2020-04-19 MED ORDER — CEFAZOLIN SODIUM-DEXTROSE 2-4 GM/100ML-% IV SOLN
2.0000 g | Freq: Three times a day (TID) | INTRAVENOUS | Status: AC
  Administered 2020-04-19 – 2020-04-20 (×2): 2 g via INTRAVENOUS
  Filled 2020-04-19 (×2): qty 100

## 2020-04-19 MED ORDER — METFORMIN HCL 500 MG PO TABS
1000.0000 mg | ORAL_TABLET | Freq: Two times a day (BID) | ORAL | Status: DC
  Administered 2020-04-20 – 2020-04-21 (×3): 1000 mg via ORAL
  Filled 2020-04-19 (×3): qty 2

## 2020-04-19 MED ORDER — ACETAMINOPHEN 500 MG PO TABS
1000.0000 mg | ORAL_TABLET | Freq: Four times a day (QID) | ORAL | Status: AC
  Administered 2020-04-19 – 2020-04-20 (×4): 1000 mg via ORAL
  Filled 2020-04-19 (×4): qty 2

## 2020-04-19 MED ORDER — FENTANYL CITRATE (PF) 100 MCG/2ML IJ SOLN
INTRAMUSCULAR | Status: AC
  Filled 2020-04-19: qty 2

## 2020-04-19 MED ORDER — ONDANSETRON HCL 4 MG/2ML IJ SOLN
INTRAMUSCULAR | Status: DC | PRN
  Administered 2020-04-19: 4 mg via INTRAVENOUS

## 2020-04-19 MED ORDER — VITAMIN D 25 MCG (1000 UNIT) PO TABS
1000.0000 [IU] | ORAL_TABLET | Freq: Every day | ORAL | Status: DC
  Administered 2020-04-20 – 2020-04-21 (×2): 1000 [IU] via ORAL
  Filled 2020-04-19 (×2): qty 1

## 2020-04-19 MED ORDER — OXYCODONE HCL 5 MG/5ML PO SOLN
5.0000 mg | Freq: Once | ORAL | Status: DC | PRN
Start: 2020-04-19 — End: 2020-04-19

## 2020-04-19 MED ORDER — ONDANSETRON HCL 4 MG/2ML IJ SOLN: 4.0000 mg | Freq: Four times a day (QID) | INTRAMUSCULAR | Status: DC | PRN

## 2020-04-19 MED ORDER — SUGAMMADEX SODIUM 200 MG/2ML IV SOLN
INTRAVENOUS | Status: DC | PRN
  Administered 2020-04-19: 200 mg via INTRAVENOUS

## 2020-04-19 MED ORDER — LACTATED RINGERS IV SOLN: INTRAVENOUS | Status: DC | PRN

## 2020-04-19 MED ORDER — ALUM & MAG HYDROXIDE-SIMETH 200-200-20 MG/5ML PO SUSP: 30.0000 mL | Freq: Four times a day (QID) | ORAL | Status: DC | PRN

## 2020-04-19 MED ORDER — RAMIPRIL 5 MG PO CAPS
10.0000 mg | ORAL_CAPSULE | Freq: Every day | ORAL | Status: DC
  Administered 2020-04-20 – 2020-04-21 (×2): 10 mg via ORAL
  Filled 2020-04-19: qty 2
  Filled 2020-04-19: qty 1

## 2020-04-19 MED ORDER — OXYCODONE HCL 5 MG PO TABS
5.0000 mg | ORAL_TABLET | ORAL | Status: DC | PRN
  Administered 2020-04-21 (×3): 5 mg via ORAL
  Filled 2020-04-19 (×3): qty 1

## 2020-04-19 MED ORDER — DEXAMETHASONE SODIUM PHOSPHATE 10 MG/ML IJ SOLN
INTRAMUSCULAR | Status: AC
  Filled 2020-04-19: qty 1

## 2020-04-19 MED ORDER — OXYCODONE HCL 5 MG PO TABS: 5.0000 mg | ORAL_TABLET | Freq: Once | ORAL | Status: DC | PRN

## 2020-04-19 MED ORDER — CYCLOBENZAPRINE HCL 10 MG PO TABS
10.0000 mg | ORAL_TABLET | Freq: Three times a day (TID) | ORAL | Status: DC | PRN
  Administered 2020-04-21: 10 mg via ORAL
  Filled 2020-04-19 (×2): qty 1

## 2020-04-19 SURGICAL SUPPLY — 63 items
APL SKNCLS STERI-STRIP NONHPOA (GAUZE/BANDAGES/DRESSINGS) ×1
BAND INSRT 18 STRL LF DISP RB (MISCELLANEOUS)
BAND RUBBER #18 3X1/16 STRL (MISCELLANEOUS) IMPLANT
BASKET BONE COLLECTION (BASKET) ×1 IMPLANT
BENZOIN TINCTURE PRP APPL 2/3 (GAUZE/BANDAGES/DRESSINGS) ×2 IMPLANT
BIT DRILL NEURO 2X3.1 SFT TUCH (MISCELLANEOUS) ×1 IMPLANT
BIT DRILL OCT ADJ 2.3 (BIT) ×1 IMPLANT
BLADE CLIPPER SURG (BLADE) ×1 IMPLANT
BLADE ULTRA TIP 2M (BLADE) IMPLANT
CANISTER SUCT 3000ML PPV (MISCELLANEOUS) ×2 IMPLANT
CAP CLSR POST CERV (Cap) ×4 IMPLANT
CARTRIDGE OIL MAESTRO DRILL (MISCELLANEOUS) ×1 IMPLANT
COVER WAND RF STERILE (DRAPES) ×1 IMPLANT
DECANTER SPIKE VIAL GLASS SM (MISCELLANEOUS) ×2 IMPLANT
DIFFUSER DRILL AIR PNEUMATIC (MISCELLANEOUS) ×2 IMPLANT
DRAPE C-ARM 42X72 X-RAY (DRAPES) ×4 IMPLANT
DRAPE LAPAROTOMY 100X72 PEDS (DRAPES) ×2 IMPLANT
DRAPE MICROSCOPE LEICA (MISCELLANEOUS) IMPLANT
DRAPE SURG 17X23 STRL (DRAPES) ×6 IMPLANT
DRILL NEURO 2X3.1 SOFT TOUCH (MISCELLANEOUS) ×2
DRSG OPSITE POSTOP 4X6 (GAUZE/BANDAGES/DRESSINGS) ×1 IMPLANT
ELECT REM PT RETURN 9FT ADLT (ELECTROSURGICAL) ×2
ELECTRODE REM PT RTRN 9FT ADLT (ELECTROSURGICAL) ×1 IMPLANT
GAUZE 4X4 16PLY RFD (DISPOSABLE) IMPLANT
GAUZE SPONGE 4X4 12PLY STRL (GAUZE/BANDAGES/DRESSINGS) IMPLANT
GLOVE BIO SURGEON STRL SZ8 (GLOVE) ×2 IMPLANT
GLOVE BIO SURGEON STRL SZ8.5 (GLOVE) ×2 IMPLANT
GLOVE EXAM NITRILE XL STR (GLOVE) IMPLANT
GOWN STRL REUS W/ TWL LRG LVL3 (GOWN DISPOSABLE) IMPLANT
GOWN STRL REUS W/ TWL XL LVL3 (GOWN DISPOSABLE) ×1 IMPLANT
GOWN STRL REUS W/TWL 2XL LVL3 (GOWN DISPOSABLE) ×2 IMPLANT
GOWN STRL REUS W/TWL LRG LVL3 (GOWN DISPOSABLE)
GOWN STRL REUS W/TWL XL LVL3 (GOWN DISPOSABLE) ×2
KIT BASIN OR (CUSTOM PROCEDURE TRAY) ×2 IMPLANT
KIT TURNOVER KIT B (KITS) ×2 IMPLANT
NDL SPNL 18GX3.5 QUINCKE PK (NEEDLE) IMPLANT
NEEDLE HYPO 22GX1.5 SAFETY (NEEDLE) ×2 IMPLANT
NEEDLE SPNL 18GX3.5 QUINCKE PK (NEEDLE) ×2 IMPLANT
NS IRRIG 1000ML POUR BTL (IV SOLUTION) ×2 IMPLANT
OIL CARTRIDGE MAESTRO DRILL (MISCELLANEOUS) ×2
PACK LAMINECTOMY NEURO (CUSTOM PROCEDURE TRAY) ×2 IMPLANT
PAD ARMBOARD 7.5X6 YLW CONV (MISCELLANEOUS) ×6 IMPLANT
PATTIES SURGICAL .25X.25 (GAUZE/BANDAGES/DRESSINGS) IMPLANT
PATTIES SURGICAL .5 X.5 (GAUZE/BANDAGES/DRESSINGS) ×2 IMPLANT
PIN MAYFIELD SKULL DISP (PIN) ×2 IMPLANT
ROD VIRAGE 3.5X35MM STRAIGHT (Cage) ×2 IMPLANT
SCREW VIRAGE 3.5X26MM POLY (Cage) ×1 IMPLANT
SCREW VIRAGE 3.5X28MM POLY (Screw) ×2 IMPLANT
SCREW VIRAGE 3.5X30 SM SHANK (Cage) ×2 IMPLANT
SEALANT ADHERUS EXTEND TIP (MISCELLANEOUS) ×1 IMPLANT
SPONGE LAP 4X18 RFD (DISPOSABLE) IMPLANT
SPONGE NEURO XRAY DETECT 1X3 (DISPOSABLE) IMPLANT
SPONGE SURGIFOAM ABS GEL SZ50 (HEMOSTASIS) ×2 IMPLANT
STAPLER SKIN PROX WIDE 3.9 (STAPLE) IMPLANT
STRIP CLOSURE SKIN 1/2X4 (GAUZE/BANDAGES/DRESSINGS) ×1 IMPLANT
SUT ETHILON 2 0 FS 18 (SUTURE) IMPLANT
SUT VIC AB 0 CT1 18XCR BRD8 (SUTURE) ×1 IMPLANT
SUT VIC AB 0 CT1 8-18 (SUTURE) ×2
SUT VIC AB 2-0 CP2 18 (SUTURE) ×2 IMPLANT
TOWEL GREEN STERILE (TOWEL DISPOSABLE) ×2 IMPLANT
TOWEL GREEN STERILE FF (TOWEL DISPOSABLE) ×2 IMPLANT
TRAY FOLEY MTR SLVR 16FR STAT (SET/KITS/TRAYS/PACK) ×1 IMPLANT
WATER STERILE IRR 1000ML POUR (IV SOLUTION) ×2 IMPLANT
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
## 2020-04-19 NOTE — Progress Notes (Signed)
Orthopedic Tech Progress Note Patient Details:  Lisa Mann Feb 27, 1933 [id number] When I spoke with HANGER (one of the workers) he stated patient may do better with a Sunrise due to her height  Patient ID: Lisa Mann, female   DOB: March 06, 1933, 87 y.o.   MRN: [id number]   [name] 04/19/2020, 3:24 PM

## 2020-04-19 NOTE — Progress Notes (Addendum)
Subjective: The patient is alert and pleasant.  She is in no apparent distress.  She looks well.  Objective: Vital signs in last 24 hours: Temp:  [98.2 F (36.8 C)] 98.2 F (36.8 C) (04/13 0907) Pulse Rate:  [82] 82 (04/13 0907) Resp:  [18] 18 (04/13 0907) BP: (137)/(64) 137/64 (04/13 0907) SpO2:  [98 %] 98 % (04/13 0907) Weight:  [78.9 kg] 78.9 kg (04/13 0907) Estimated body mass index is 30.82 kg/m as calculated from the following:   Height as of this encounter: 5\' 3"  (1.6 m).   Weight as of this encounter: 78.9 kg.   Intake/Output from previous day: No intake/output data recorded. Intake/Output this shift: Total I/O In: 1000 [I.V.:1000] Out: 300 [Urine:150; Blood:150]  Physical exam the patient is alert and oriented.  Her speech is normal.  Her strength is normal except she has weakness in her left hand.  Her pupils are equal.  There is no evidence of Wallenberg syndrome.  Lab Results: Recent Labs    04/19/20 0849 04/19/20 1023  WBC 5.4  --   HGB 12.2 13.3  HCT 37.2 39.0  PLT 291  --    BMET Recent Labs    04/19/20 0849 04/19/20 1023  NA 139 139  K 4.6 3.8  CL 101 98  CO2 32  --   GLUCOSE 119* 114*  BUN 14 15  CREATININE 0.71 0.60  CALCIUM 9.9  --     Studies/Results: No results found.  Assessment/Plan: Status post C1-2 decompression, instrumentation and fusion: The patient is doing well.  I discussed my concern for a vertebral artery injury with the patient's daughter and informed her she is doing fine but we are going to keep an eye on her in the ICU.  I have answered all her questions.  LOS: 0 days     Ophelia Charter 04/19/2020, 1:51 PM

## 2020-04-19 NOTE — Op Note (Signed)
Brief history: The patient is an 87 year old black female who presented with unsteady gait, falls, and weakness and numbness, etc.  She was worked up with a brain MRI which demonstrated severe stenosis at C1-2.  X-rays demonstrated evidence of instability at C1-2.  I discussed the various treatment options with the patient.  She has decided proceed with a C1-2 decompression, instrumentation fusion after weighing the risk, benefits and alternatives.  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 Earle Gell  Assistant: Arnetha Massy, NP  Anesthesia: General tracheal  Estimated blood loss: 150 cc  Specimens: None  Drains: None  Complications: Likely right vertebral artery injury  Description of procedure: The patient was brought to the operating room by the anesthesia team.  General endotracheal anesthesia was induced.  I then applied the radiolucent Mayfield three-point headrest to the patient's calvarium.  The patient was then carefully turned to the prone position with her head supported and the Mayfield headrest.  We confirmed a good neutral position with lateral fluoroscopy.  The patient's suboccipital region was then shaved with clippers and prepared with Betadine scrub and Betadine solution.  Sterile drapes were applied.  I then injected the area to be incised with Marcaine with epinephrine solution.  I used a scalpel to make a linear midline incision over the C1-2 interspace.  I used electrocautery to perform a bilateral subperiosteal dissection exposing the C1 posterior ring and the C2 spinous process and lamina.  M provided exposure we did create a small durotomy which we controlled  with Gelfoam and at the end of the case, covered with DuraSeal.  We inserted the [facility] retractor for exposure.  We now turned our attention to the decompression.  I used a high-speed drill to thin out the posterior C1 lamina .  We completed the posterior removal of the C1 ring with the Kerrison punches decompressing the thecal sac.  We saved the bone we removed for later use as local autograft bone during  the fusion.  We now turned our attention to the instrumentation.  Using electrocautery we exposed the lateral C1 ring and the C2 pars/pedicle bilaterally.  We dissected through the lateral venous plexus and identified the exiting nerve root bilaterally.  I freed up the nerve root bilaterally from the epidural venous plexus.  The assistant then gently retracted the nerve root caudally with a Penfield #4 given exposure to the C1 lateral mass.  Under fluoroscopic guidance I drilled the left C1 lateral mass to approximately 20 mm.  We probed inside the drill hole and ruled out cortical breaches.  We  then inserted a 26 mm fully threaded screw into the left C1 lateral mass cephalad to the nerve root.  We got good bony purchase.  Under fluoroscopic guidance I drilled a pilot hole along the left C2 lamina pars.  We then probed inside the drill hole with a ball probe and again run a cortical breaches.  We inserted a 28 mm fully screw into the left lamina/pars again getting good bony purchase.  We now turned our attention to the right-sided screws.  The assistant again retract the nerve root gently caudally.  I drilled a 20 mm hole into the right C1 lateral mass under fluoroscopic guidance.  We remove the drill and probed  inside the hole to rule out cortical breaches.  I then inserted another 26 mm partially-threaded screw into the right C1 lateral mass getting good bony purchase.  We now turned attention to the right C2 screw.  Again under fluoroscopic guidance I drilled and 28 mm pilot hole.  When I remove the  screw we encountered bright red bleeding through the drill hole consistent with a vertebral artery injury.  This was easily controlled with bone wax.  I then inserted a 28 mm fully threaded screw into the drill hole and again appeared to get good bony purchase.  There was no more bleeding.  I did not see any hematomas.  We then connected the unilateral screws with a rod which we secured in place with the caps.  This completed the instrumentation at C1-2 bilaterally.  We now turned our attention to the arthrodesis.  The sagittal drill to decorticate the lateral C1 lateral ring and the C 2 lateral lamina.  We then laid the autograft bone we obtained the decompression over the decorticated structures on the left complaining of the posterior arthrodesis at C1-2.  We then obtained hemostasis using bipolar cautery.  We inspected the dura.  It appeared well decompressed.  We then removed the retractor and reapproximated the patient's cervical fascia with interrupted 0 Vicryl suture.  We reapproximated the cutaneous tissue with interrupted 2-0 Vicryl suture.  We reapproximated the skin with Steri-Strips and benzoin.  The wound was then coated with bacitracin ointment.  A honeycomb dressing was applied.  The drapes were removed.  The patient was returned to the supine position.  I remove the Mayfield three-point headrest.  By report all sponge, instrument, and needle counts were correct at the end of this case.

## 2020-04-19 NOTE — Anesthesia Postprocedure Evaluation (Signed)
Anesthesia Post Note  Patient: SHIRI HODAPP  Procedure(s) Performed: CERVICAL ONE-CERVICAL TWO LAMINECTOMY, INSTRUMENTATION AND FUSION (N/A )     Patient location during evaluation: PACU Anesthesia Type: General Level of consciousness: awake and alert Pain management: pain level controlled Vital Signs Assessment: post-procedure vital signs reviewed and stable Respiratory status: spontaneous breathing, nonlabored ventilation, respiratory function stable and patient connected to nasal cannula oxygen Cardiovascular status: blood pressure returned to baseline and stable Postop Assessment: no apparent nausea or vomiting Anesthetic complications: no   No complications documented.  Last Vitals:  Vitals:   04/19/20 1500 04/19/20 1600  BP: (!) 148/72 (!) 153/71  Pulse: 77 86  Resp: 11 14  Temp:    SpO2: 100% 99%    Last Pain:  Vitals:   04/19/20 1430  TempSrc:   PainSc: 5                  Amit Meloy P Antoria Lanza

## 2020-04-19 NOTE — Anesthesia Preprocedure Evaluation (Addendum)
Anesthesia Evaluation  Patient identified by MRN, date of birth, ID band Patient awake    Reviewed: Allergy & Precautions, NPO status , Patient's Chart, lab work & pertinent test results  Airway Mallampati: II  TM Distance: >3 FB Neck ROM: Full    Dental  (+) Teeth Intact   Pulmonary neg pulmonary ROS,    Pulmonary exam normal        Cardiovascular hypertension, Pt. on medications  Rhythm:Regular Rate:Normal     Neuro/Psych  Headaches, Depression    GI/Hepatic Neg liver ROS, GERD  Medicated,  Endo/Other  diabetes, Type 2, Oral Hypoglycemic Agents  Renal/GU negative Renal ROS  negative genitourinary   Musculoskeletal  (+) Arthritis , Osteoarthritis,  AA instability here for C1/C2 H/o breast Ca bilaterally   Abdominal (+)  Abdomen: soft. Bowel sounds: normal.  Peds  Hematology negative hematology ROS (+)   Anesthesia Other Findings   Reproductive/Obstetrics                            Anesthesia Physical Anesthesia Plan  ASA: II  Anesthesia Plan: General   Post-op Pain Management:    Induction: Intravenous  PONV Risk Score and Plan: 3 and Ondansetron, Dexamethasone and Treatment may vary due to age or medical condition  Airway Management Planned: Mask and Oral ETT  Additional Equipment: None  Intra-op Plan:   Post-operative Plan: Extubation in OR  Informed Consent: I have reviewed the patients History and Physical, chart, labs and discussed the procedure including the risks, benefits and alternatives for the proposed anesthesia with the patient or authorized representative who has indicated his/her understanding and acceptance.     Dental advisory given  Plan Discussed with: CRNA  Anesthesia Plan Comments: (      Lab Results      Component                Value               Date                      NA                       140                 03/15/2020                K                         4.6                 03/15/2020                CO2                      26                  03/15/2020                GLUCOSE                  117 (H)             03/15/2020                BUN  17                  03/15/2020                CREATININE               0.69                03/15/2020                CALCIUM                  10.3                03/15/2020                GFRNONAA                 >60                 10/14/2019                GFRAA                    >60                 10/06/2019          )        Anesthesia Quick Evaluation

## 2020-04-19 NOTE — Progress Notes (Signed)
Patient reported burning to feet reports this was happening prior to surgery and reports the feeling is no worse or better

## 2020-04-19 NOTE — Transfer of Care (Signed)
Immediate Anesthesia Transfer of Care Note  Patient: Lisa Mann  Procedure(s) Performed: CERVICAL ONE-CERVICAL TWO LAMINECTOMY, INSTRUMENTATION AND FUSION (N/A )  Patient Location: PACU  Anesthesia Type:General  Level of Consciousness: drowsy  Airway & Oxygen Therapy: Patient Spontanous Breathing and Patient connected to face mask oxygen  Post-op Assessment: Report given to RN and Post -op Vital signs reviewed and stable  Post vital signs: Reviewed and stable  Last Vitals:  Vitals Value Taken Time  BP 151/74 04/19/20 1329  Temp    Pulse 77 04/19/20 1332  Resp 17 04/19/20 1332  SpO2 100 % 04/19/20 1332  Vitals shown include unvalidated device data.  Last Pain:  Vitals:   04/19/20 0907  TempSrc: Oral         Complications: No complications documented.

## 2020-04-19 NOTE — Anesthesia Procedure Notes (Signed)
Procedure Name: Intubation Date/Time: 04/19/2020 10:46 AM Performed by: Candis Shine, CRNA Pre-anesthesia Checklist: Patient identified, Emergency Drugs available, Suction available and Patient being monitored Patient Re-evaluated:Patient Re-evaluated prior to induction Oxygen Delivery Method: Circle System Utilized Preoxygenation: Pre-oxygenation with 100% oxygen Induction Type: IV induction Ventilation: Mask ventilation without difficulty Laryngoscope Size: Glidescope and 3 Grade View: Grade I Tube type: Oral Tube size: 7.0 mm Number of attempts: 1 Airway Equipment and Method: Video-laryngoscopy and Rigid stylet Placement Confirmation: ETT inserted through vocal cords under direct vision,  positive ETCO2 and breath sounds checked- equal and bilateral Secured at: 22 cm Tube secured with: Tape Dental Injury: Teeth and Oropharynx as per pre-operative assessment  Difficulty Due To: Difficult Airway- due to reduced neck mobility

## 2020-04-20 ENCOUNTER — Encounter (HOSPITAL_COMMUNITY): Payer: Self-pay | Admitting: Neurosurgery

## 2020-04-20 LAB — GLUCOSE, CAPILLARY
Glucose-Capillary: 124 mg/dL — ABNORMAL HIGH (ref 70–99)
Glucose-Capillary: 138 mg/dL — ABNORMAL HIGH (ref 70–99)

## 2020-04-20 MED ORDER — ALLOPURINOL 100 MG PO TABS
100.0000 mg | ORAL_TABLET | Freq: Every day | ORAL | Status: DC
  Administered 2020-04-20 – 2020-04-21 (×2): 100 mg via ORAL
  Filled 2020-04-20 (×3): qty 1

## 2020-04-20 NOTE — Evaluation (Signed)
Physical Therapy Evaluation Patient Details Name: Lisa Mann MRN: [id number] DOB: 01-Sep-1933 Today's Date: 04/20/2020   History of Present Illness  87 yo female who presented 4/12 with numbness, tingling, weakness and gait instability. 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 S/p C1-2 decompression and fusion 4/13. PMH including breast cancer s/p bil mastectomy (2010), DM type 2, DJD, L wrist fx (2005), and HTN.    Clinical Impression  Pt presents with condition above and deficits mentioned below, see PT Problem List. PTA, pt was living with her daughter and was mod I use of SPC for mobility. Currently, pt requires Min Guard-Min A for functional mobility using RW or 1 rail on stairs. Pt demonstrates impaired sensation in bil legs (with her reporting L worse than R) and decreased strength in her legs, especially on the L. Provided education on cervical precautions, collar management, bed mobility, and gait and stair safety with guarding and use of AD/AE. Recommend d/c with follow-up outpatient PT to maximize pt safety and independence with all functional mobility. Will continue to follow acutely.     Follow Up Recommendations Outpatient PT;Supervision/Assistance - 24 hour    Equipment Recommendations  Rolling walker with 5" wheels    Recommendations for Other Services       Precautions / Restrictions Precautions Precautions: Fall;Cervical Precaution Booklet Issued: Yes (comment) Precaution Comments: Verbally reviewed cervical precautions Required Braces or Orthoses: Cervical Brace Cervical Brace: Hard collar Restrictions Weight Bearing Restrictions: No      Mobility  Bed Mobility Overal bed mobility: Needs Assistance Bed Mobility: Rolling;Sidelying to Sit Rolling: Min assist;+2 for safety/equipment Sidelying to sit: Min assist;+2 for safety/equipment       General bed mobility comments: Min A for rolling to L and then bringing elevating trunk.    Transfers Overall  transfer level: Needs assistance Equipment used: Rolling walker (2 wheeled) Transfers: Sit to/from Stand Sit to Stand: Min guard         General transfer comment: Min Guard A for safety into standing. Close Guard to watch L knee as pt presenting with weakness  Ambulation/Gait Ambulation/Gait assistance: Min guard Gait Distance (Feet): 350 Feet Assistive device: Rolling walker (2 wheeled) Gait Pattern/deviations: Step-through pattern;Decreased step length - left;Trunk flexed Gait velocity: reduced Gait velocity interpretation: <1.31 ft/sec, indicative of household ambulator General Gait Details: Close min guard to monitor L knee due to noted weakness, but no appreciative buckling noted. Cues to remain proximal and within RW.  Stairs Stairs: Yes Stairs assistance: Min assist Stair Management: One rail Right;Step to pattern Number of Stairs: 2 General stair comments: Ascending forward and descending backwards x1 step with use of R rail then progressed to ascending and descending forward x1 step with R rail up and L down. Progressed from minA to ascend to min guard, but needing minA for stabilizing when descending. Cues provided to lead up with R leg and down with L, poor carryover with descending needing reminders.  Wheelchair Mobility    Modified Rankin (Stroke Patients Only)       Balance Overall balance assessment: Needs assistance Sitting-balance support: No upper extremity supported;Feet supported Sitting balance-Leahy Scale: Fair     Standing balance support: Bilateral upper extremity supported;During functional activity;No upper extremity supported Standing balance-Leahy Scale: Fair Standing balance comment: Able to maintain standing balance while at sink                             Pertinent Vitals/Pain  Pain Assessment: 0-10 Pain Score: 7  Pain Location: neck, low back Pain Descriptors / Indicators: Discomfort;Grimacing;Guarding;Operative site  guarding Pain Intervention(s): Monitored during session;Repositioned    Home Living Family/patient expects to be discharged to:: Private residence Living Arrangements: Children (Daughter) Available Help at Discharge: Family;Available 24 hours/day Type of Home: House Home Access: Stairs to enter Entrance Stairs-Rails: None Entrance Stairs-Number of Steps: 1 Home Layout: One level Home Equipment: Shower seat;Cane - single point;Grab bars - tub/shower;Bedside commode      Prior Function Level of Independence: Independent with assistive device(s)         Comments: Pt needs assistance for getting into/out of showers. Performs IADLs including simple meals, medication management, and driving short distance. Pt uses SPC for functional mobility.     Hand Dominance   Dominant Hand: Right    Extremity/Trunk Assessment   Upper Extremity Assessment Upper Extremity Assessment: LUE deficits/detail LUE Deficits / Details: Numbness at L hand. Decreased coorindation with FM; performing finger opposition slowly. LUE Coordination: decreased fine motor    Lower Extremity Assessment Lower Extremity Assessment: Defer to PT evaluation RLE Deficits / Details: MMT scores of 4 to 4+ grossly RLE Sensation: decreased light touch LLE Deficits / Details: MMT scores of 3+ to 4 grossly LLE Sensation: decreased light touch (less sensation than R per pt report)    Cervical / Trunk Assessment Cervical / Trunk Assessment: Other exceptions Cervical / Trunk Exceptions: s/p cervical sx  Communication   Communication: No difficulties  Cognition Arousal/Alertness: Awake/alert Behavior During Therapy: WFL for tasks assessed/performed Overall Cognitive Status: Within Functional Limits for tasks assessed Area of Impairment: Memory                     Memory: Decreased recall of precautions         General Comments: Requring cues for adherance to precautions; however, feel like pt with improve  with time      General Comments General comments (skin integrity, edema, etc.): daughter arriving during session    Exercises     Assessment/Plan    PT Assessment Patient needs continued PT services  PT Problem List Decreased strength;Decreased activity tolerance;Decreased balance;Decreased mobility;Decreased coordination;Decreased knowledge of use of DME;Decreased safety awareness;Decreased knowledge of precautions;Impaired sensation       PT Treatment Interventions DME instruction;Gait training;Stair training;Functional mobility training;Therapeutic activities;Balance training;Therapeutic exercise;Neuromuscular re-education;Patient/family education    PT Goals (Current goals can be found in the Care Plan section)  Acute Rehab PT Goals Patient Stated Goal: Go home PT Goal Formulation: With patient/family Time For Goal Achievement: 05/04/20 Potential to Achieve Goals: Good    Frequency Min 5X/week   Barriers to discharge        Co-evaluation PT/OT/SLP Co-Evaluation/Treatment: Yes Reason for Co-Treatment: For patient/therapist safety;To address functional/ADL transfers PT goals addressed during session: Mobility/safety with mobility;Balance;Proper use of DME OT goals addressed during session: ADL's and self-care       AM-PAC PT "6 Clicks" Mobility  Outcome Measure Help needed turning from your back to your side while in a flat bed without using bedrails?: A Little Help needed moving from lying on your back to sitting on the side of a flat bed without using bedrails?: A Little Help needed moving to and from a bed to a chair (including a wheelchair)?: A Little Help needed standing up from a chair using your arms (e.g., wheelchair or bedside chair)?: A Little Help needed to walk in hospital room?: A Little Help needed climbing 3-5 steps with  a railing? : A Little 6 Click Score: 18    End of Session Equipment Utilized During Treatment: Gait belt;Cervical collar Activity  Tolerance: Patient tolerated treatment well Patient left: in chair;with call bell/phone within reach;with family/visitor present (RN requested no chair alarm) Nurse Communication: Mobility status PT Visit Diagnosis: Unsteadiness on feet (R26.81);Other abnormalities of gait and mobility (R26.89);Muscle weakness (generalized) (M62.81);Difficulty in walking, not elsewhere classified (R26.2)    Time: 3578-9784 PT Time Calculation (min) (ACUTE ONLY): 44 min   Charges:   PT Evaluation $PT Eval Moderate Complexity: 1 Mod PT Treatments $Gait Training: 8-22 mins        Moishe Spice, PT, DPT Acute Rehabilitation Services  Pager: [phone] Office: [phone]   Orvan Falconer 04/20/2020, 10:28 AM

## 2020-04-20 NOTE — Evaluation (Signed)
Occupational Therapy Evaluation Patient Details Name: Lisa Mann MRN: [id number] DOB: [birth year] Today's Date: 04/20/2020    History of Present Illness 87 yo female who presented 4/12 with numbness, tingling, weakness and gait instability. 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 S/p C1-2 decompression and fusion 4/13. PMH including breast cancer s/p bil mastectomy (2010), DM type 2, DJD, L wrist fx (2005), and HTN.   Clinical Impression   PTA, pt was living with her daughter and was independent with ADLs, light IADLs, and driving; use of SPC for mobility. Currently, pt requires Mod-Max A for UB ADLs, Min A for LB ADLs, and Min Guard-Min A for functional mobility using RW. Provided education on cervical precautions, collar management, bed mobility, oral care, and toilet transfer. Pt would benefit from further acute OT to continue education on compensatory techniques for ADLs. Recommend dc home once medically stable per physician.    Follow Up Recommendations  No OT follow up;Supervision/Assistance - 24 hour    Equipment Recommendations  Tub/shower bench (May not need tub bench pending practice)    Recommendations for Other Services PT consult     Precautions / Restrictions Precautions Precautions: Fall;Cervical Precaution Booklet Issued: Yes (comment) Precaution Comments: Verbally reviewed cervical precautions Required Braces or Orthoses: Cervical Brace Cervical Brace: Hard collar Restrictions Weight Bearing Restrictions: No      Mobility Bed Mobility Overal bed mobility: Needs Assistance Bed Mobility: Rolling;Sidelying to Sit Rolling: Min assist;+2 for safety/equipment Sidelying to sit: Min assist;+2 for safety/equipment       General bed mobility comments: Min A for rolling to L and then bringing elevating trunk.    Transfers Overall transfer level: Needs assistance Equipment used: Rolling walker (2 wheeled) Transfers: Sit to/from Stand Sit to Stand: Min guard          General transfer comment: Min Guard A for safety into standing. Close Guard to watch L knee as pt presenting with weakness    Balance Overall balance assessment: Needs assistance Sitting-balance support: No upper extremity supported;Feet supported Sitting balance-Leahy Scale: Fair     Standing balance support: Bilateral upper extremity supported;During functional activity;No upper extremity supported Standing balance-Leahy Scale: Fair Standing balance comment: Able to maintain standing balance while at sink                           ADL either performed or assessed with clinical judgement   ADL Overall ADL's : Needs assistance/impaired Eating/Feeding: Set up;Sitting   Grooming: Oral care;Min guard;Standing Grooming Details (indicate cue type and reason): Educating pt on use of cup for oral care. Pt performing at sink with Min guard A for safety and Min cues for sequencing Upper Body Bathing: Minimal assistance;Sitting   Lower Body Bathing: Moderate assistance;Sit to/from stand   Upper Body Dressing : Sitting;Maximal assistance;Moderate assistance Upper Body Dressing Details (indicate cue type and reason): Max A to don brace. Lower Body Dressing: Moderate assistance;Sit to/from stand Lower Body Dressing Details (indicate cue type and reason): Pt able to adjust socks be bringing ankle close to EOB. Pt would benefit from education on LB dressing with AE and using figure four method Toilet Transfer: Minimal assistance;Ambulation;Min guard;RW;BSC (BSC over toilet)   Toileting- Clothing Manipulation and Hygiene: Minimal assistance;Sit to/from stand Toileting - Clothing Manipulation Details (indicate cue type and reason): Educaitng pt on bending knees to prevent forward at spine. Pt requiring cues and presents with difficulty reaching to anterior peri area  Functional mobility during ADLs: Minimal assistance;Rolling walker General ADL Comments: Pt presenting with  decreased balance, strength, and acitvity tolerance s/p sx. Feel pt will progress well with time.     Vision Baseline Vision/History: Wears glasses Wears Glasses: At all times Patient Visual Report: No change from baseline Vision Assessment?: No apparent visual deficits     Perception     Praxis      Pertinent Vitals/Pain Pain Assessment: 0-10 Pain Score: 7  Pain Location: neck, low back Pain Descriptors / Indicators: Discomfort;Grimacing;Guarding;Operative site guarding Pain Intervention(s): Monitored during session;Repositioned     Hand Dominance Right   Extremity/Trunk Assessment Upper Extremity Assessment Upper Extremity Assessment: LUE deficits/detail LUE Deficits / Details: Numbness at L hand. Decreased coorindation with FM; performing finger opposition slowly. LUE Coordination: decreased fine motor   Lower Extremity Assessment Lower Extremity Assessment: Defer to PT evaluation LLE Deficits / Details: MMT scores of the following: hip flexion   Cervical / Trunk Assessment Cervical / Trunk Assessment: Other exceptions Cervical / Trunk Exceptions: s/p cervical sx   Communication Communication Communication: No difficulties   Cognition Arousal/Alertness: Awake/alert Behavior During Therapy: WFL for tasks assessed/performed Overall Cognitive Status: Within Functional Limits for tasks assessed Area of Impairment: Memory                     Memory: Decreased recall of precautions         General Comments: Requring cues for adherance to precautions; however, feel like pt with improve with time   General Comments  daughter arriving during session    Exercises     Shoulder Instructions      Home Living Family/patient expects to be discharged to:: Private residence Living Arrangements: Children (Daughter) Available Help at Discharge: Family;Available 24 hours/day Type of Home: House Home Access: Stairs to enter [employer] of Steps:  1 Entrance Stairs-Rails: None Home Layout: One level     Bathroom Shower/Tub: Teacher, early years/pre: Handicapped height     Home Equipment: Shower seat;Cane - single point;Grab bars - tub/shower;Bedside commode          Prior Functioning/Environment Level of Independence: Independent with assistive device(s)        Comments: Pt needs assistance for getting into/out of showers. Performs IADLs including simple meals, medication management, and driving short distance. Pt uses SPC for functional mobility.        OT Problem List: Decreased strength;Decreased range of motion;Decreased activity tolerance;Impaired balance (sitting and/or standing);Decreased knowledge of use of DME or AE;Decreased knowledge of precautions;Pain      OT Treatment/Interventions: Self-care/ADL training;Therapeutic exercise;Energy conservation;DME and/or AE instruction;Therapeutic activities;Patient/family education    OT Goals(Current goals can be found in the care plan section) Acute Rehab OT Goals Patient Stated Goal: Go home OT Goal Formulation: With patient/family Time For Goal Achievement: 05/04/20 Potential to Achieve Goals: Good  OT Frequency: Min 2X/week   Barriers to D/C:            Co-evaluation PT/OT/SLP Co-Evaluation/Treatment: Yes Reason for Co-Treatment: For patient/therapist safety;To address functional/ADL transfers PT goals addressed during session: Mobility/safety with mobility;Balance;Proper use of DME OT goals addressed during session: ADL's and self-care      AM-PAC OT "6 Clicks" Daily Activity     Outcome Measure Help from another person eating meals?: None Help from another person taking care of personal grooming?: A Little Help from another person toileting, which includes using toliet, bedpan, or urinal?: A Little Help from another person bathing (including  washing, rinsing, drying)?: A Little Help from another person to put on and taking off regular  upper body clothing?: A Lot Help from another person to put on and taking off regular lower body clothing?: A Lot 6 Click Score: 17   End of Session Equipment Utilized During Treatment: Rolling walker;Gait belt;Cervical collar Nurse Communication: Mobility status  Activity Tolerance: Patient tolerated treatment well Patient left: in chair;with call bell/phone within reach;with chair alarm set  OT Visit Diagnosis: Unsteadiness on feet (R26.81);Other abnormalities of gait and mobility (R26.89);Muscle weakness (generalized) (M62.81);Pain Pain - part of body:  (Neck)                Time: 9249-3241 OT Time Calculation (min): 28 min Charges:  OT General Charges $OT Visit: 1 Visit OT Evaluation $OT Eval Moderate Complexity: [place], OTR/L Acute Rehab Pager: [phone] Office: [place] 04/20/2020, 9:39 AM

## 2020-04-20 NOTE — Progress Notes (Signed)
Subjective: The patient is alert, pleasant and in no apparent distress.  She looks well.  She says her neck is "a little sore".  Objective: Vital signs in last 24 hours: Temp:  [97 F (36.1 C)-98.2 F (36.8 C)] 98.2 F (36.8 C) (04/14 0400) Pulse Rate:  [75-98] 76 (04/14 0700) Resp:  [11-24] 19 (04/14 0700) BP: (102-157)/(53-90) 120/55 (04/14 0700) SpO2:  [95 %-100 %] 97 % (04/14 0700) Weight:  [78.9 kg-84 kg] 84 kg (04/13 1500) Estimated body mass index is 32.8 kg/m as calculated from the following:   Height as of this encounter: 5\' 3"  (1.6 m).   Weight as of this encounter: 84 kg.   Intake/Output from previous day: 04/13 0701 - 04/14 0700 In: 2193.7 [I.V.:1993.6; IV Piggyback:200.1] Out: 1200 [Urine:1050; Blood:150] Intake/Output this shift: No intake/output data recorded.  Physical exam the patient is alert and oriented.  Her cranial nerves are unremarkable.  Her strength is normal except for some chronic weakness in her left hand.  Lab Results: Recent Labs    04/19/20 0849 04/19/20 1023  WBC 5.4  --   HGB 12.2 13.3  HCT 37.2 39.0  PLT 291  --    BMET Recent Labs    04/19/20 0849 04/19/20 1023  NA 139 139  K 4.6 3.8  CL 101 98  CO2 32  --   GLUCOSE 119* 114*  BUN 14 15  CREATININE 0.71 0.60  CALCIUM 9.9  --     Studies/Results: DG Cervical Spine 1 View  Result Date: 04/19/2020 CLINICAL DATA:  C1-C2 laminectomy with instrumentation and fusion. EXAM: DG CERVICAL SPINE - 1 VIEW; DG C-ARM 1-60 MIN COMPARISON:  None. FINDINGS: Single lateral fluoroscopic spot view of the cervical spine obtained in the operating room. Posterior screws are seen at the level of C1 on C2. Total fluoroscopy time 40.7 seconds. Total dose 4.52 mGy. IMPRESSION: Intraoperative fluoroscopy during cervical spine surgery. Electronically Signed   By: Keith Rake M.D.   On: 04/19/2020 15:03   DG C-Arm 1-60 Min  Result Date: 04/19/2020 CLINICAL DATA:  C1-C2 laminectomy with  instrumentation and fusion. EXAM: DG CERVICAL SPINE - 1 VIEW; DG C-ARM 1-60 MIN COMPARISON:  None. FINDINGS: Single lateral fluoroscopic spot view of the cervical spine obtained in the operating room. Posterior screws are seen at the level of C1 on C2. Total fluoroscopy time 40.7 seconds. Total dose 4.52 mGy. IMPRESSION: Intraoperative fluoroscopy during cervical spine surgery. Electronically Signed   By: Keith Rake M.D.   On: 04/19/2020 15:03    Assessment/Plan: Postop day #1: The patient is doing well.  I will transfer her to the floor.  She will likely go home tomorrow.  Possible vertebral artery injury: She has had no ill effects of this.  LOS: 1 day     Lisa Mann 04/20/2020, 7:52 AM

## 2020-04-21 MED ORDER — ONDANSETRON HCL 4 MG PO TABS: 4.0000 mg | ORAL_TABLET | Freq: Four times a day (QID) | ORAL | 0 refills | Status: DC | PRN

## 2020-04-21 MED ORDER — OXYCODONE HCL 5 MG PO TABS: 5.0000 mg | ORAL_TABLET | ORAL | 0 refills | Status: DC | PRN

## 2020-04-21 NOTE — Plan of Care (Signed)

## 2020-04-21 NOTE — Progress Notes (Signed)
Physical Therapy Treatment Patient Details Name: Lisa Mann MRN: [id number] DOB: [birth year] Today's Date: 04/21/2020    History of Present Illness 87 yo female who presented 4/12 with numbness, tingling, weakness and gait instability. 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 S/p C1-2 decompression and fusion 4/13. PMH including breast cancer s/p bil mastectomy (2010), DM type 2, DJD, L wrist fx (2005), and HTN.    PT Comments    Pt received sitting on EOB with nursing. Reviewed precautions with pt. Pt generally min guard but did require min A and increased cueing to maintain precautions as she fatigued towards end of session. Pt able to ambulate several bouts with cueing for navigation. Attempted standing exercises, but pt only able to do 3-4 standing marches due to shoulder, neck, and back pain. Overall session (gait distance and standing exercises) limited by increased pain. Pt would benefit from PT to improve strength, address balance, and increase independency. Will continue to follow acutely.   Follow Up Recommendations  Outpatient PT;Supervision/Assistance - 24 hour     Equipment Recommendations  Rolling walker with 5" wheels    Recommendations for Other Services       Precautions / Restrictions Precautions Precautions: Fall;Cervical Precaution Booklet Issued: Yes (comment) Precaution Comments: Verbally reviewed cervical precautions Required Braces or Orthoses: Cervical Brace Cervical Brace: Hard collar Restrictions Weight Bearing Restrictions: No    Mobility  Bed Mobility Overal bed mobility: Needs Assistance Bed Mobility: Supine to Sit           General bed mobility comments: Pt received sitting on EOB    Transfers Overall transfer level: Needs assistance Equipment used: Rolling walker (2 wheeled) Transfers: Sit to/from Stand Sit to Stand: Min guard;Min assist         General transfer comment: Min guard for first few sit to stands but min A towards  end of session when pt was fatigued to ensure pt was pushing too much through UEs  Ambulation/Gait Ambulation/Gait assistance: Min guard Gait Distance (Feet): 90 Feet (114 total = 90+12+12)         General Gait Details: Close min guard to monitor L knee due to noted weakness, but no buckling noted. Cues to navigate around obstacles in hallway   Stairs             Wheelchair Mobility    Modified Rankin (Stroke Patients Only)       Balance Overall balance assessment: Needs assistance Sitting-balance support: No upper extremity supported;Feet supported Sitting balance-Leahy Scale: Fair Sitting balance - Comments: sitting EOB to eat breakfast with no UE support or LOB   Standing balance support: Bilateral upper extremity supported;During functional activity;No upper extremity supported Standing balance-Leahy Scale: Fair Standing balance comment: Able to maintain standing balance while at sink to wash hands but benefits from UE support for ambulation                            Cognition Arousal/Alertness: Awake/alert Behavior During Therapy: WFL for tasks assessed/performed Overall Cognitive Status: Within Functional Limits for tasks assessed Area of Impairment: Memory                     Memory: Decreased recall of precautions         General Comments: Requring cues for adherance to precautions; however, feel like pt with improve with time      Exercises      General Comments General comments (  skin integrity, edema, etc.): issued pt handout on LB AE and cervical precautions, incision appears clean and dry, donned brace at start of session and provided education on changing pads for brace      Pertinent Vitals/Pain Pain Assessment: Faces Faces Pain Scale: Hurts whole lot Pain Location: L shoulder, neck, and low back Pain Descriptors / Indicators: Discomfort;Sore;Grimacing;Guarding Pain Intervention(s): Monitored during  session;Premedicated before session;Repositioned    Home Living                      Prior Function            PT Goals (current goals can now be found in the care plan section) Acute Rehab PT Goals Patient Stated Goal: to go home and improve PT Goal Formulation: With patient/family Time For Goal Achievement: 05/04/20 Potential to Achieve Goals: Good Progress towards PT goals: Progressing toward goals    Frequency    Min 5X/week      PT Plan      Co-evaluation              AM-PAC PT "6 Clicks" Mobility   Outcome Measure  Help needed turning from your back to your side while in a flat bed without using bedrails?: A Little Help needed moving from lying on your back to sitting on the side of a flat bed without using bedrails?: A Little Help needed moving to and from a bed to a chair (including a wheelchair)?: A Little Help needed standing up from a chair using your arms (e.g., wheelchair or bedside chair)?: A Little Help needed to walk in hospital room?: A Little Help needed climbing 3-5 steps with a railing? : A Little 6 Click Score: 18    End of Session Equipment Utilized During Treatment: Gait belt;Cervical collar Activity Tolerance: Patient limited by pain Patient left: in chair;with call bell/phone within reach (RN requested no chair alarm) Nurse Communication: Mobility status PT Visit Diagnosis: Unsteadiness on feet (R26.81);Other abnormalities of gait and mobility (R26.89);Muscle weakness (generalized) (M62.81);Difficulty in walking, not elsewhere classified (R26.2)     Time:  -     Charges:                        Rosita Kea, SPT

## 2020-04-21 NOTE — TOC Transition Note (Addendum)
Transition of Care [facility]) - CM/SW Discharge Note   Patient Details  Name: Lisa Mann MRN: [id number] Date of Birth: 1933/03/28  Transition of Care [facility]) CM/SW Contact:  Lisa Friar, RN Phone Number: 04/21/2020, 11:35 AM   Clinical Narrative:    Recommendations are for outpatient therapy. Usually Neurosurgery will see pt in office before this is arranged. Pt does have recommendations for walker and shower seat. Pt states she needs both. DME ordered through [facility] and will be delivered to the room. Pt states she has 24 hour supervision at home with her daughter.  She denies issues with home medications or transportation. Daughter to provide transport home when medically ready.  1440: MD ordered [facility] services. CM met with the patient and her daughter. They have no preference for [facility] services. CM arranged [facility] with [facility]. Information on the AVS.   Final next level of care: Home/Self Care Barriers to Discharge: No Barriers Identified   Patient Goals and CMS Choice     Choice offered to / list presented to : Patient  Discharge Placement                       Discharge Plan and Services                DME Arranged: 3-N-1,Walker rolling DME Agency: [employer] Date DME Agency Contacted: 04/21/20   Representative spoke with at DME Agency: [facility] Determinants of Health ([place]) Interventions     Readmission Risk Interventions No flowsheet data found.

## 2020-04-21 NOTE — Discharge Summary (Signed)
Physician Discharge Summary  Patient ID: Lisa Mann MRN: [id number] DOB/AGE: 85-Aug-1935 85 y.o.  Admit date: 04/19/2020 Discharge date: 04/21/2020  Admission Diagnoses: Atlantoaxial instability with stenosis.  Cervical myelopathy Discharge Diagnoses: Atlantoaxial instability with stenosis.  Cervical myelopathy Active Problems:   Atlantoaxial instability   Discharged Condition: good  Hospital Course: Patient was admitted to undergo posterior decompression and fusion which he tolerated well.  Consults: None  Significant Diagnostic Studies: None  Treatments: surgery: See op note  Discharge Exam: Blood pressure 139/70, pulse 80, temperature 98.5 F (36.9 C), temperature source Oral, resp. rate 16, height '5\' 3"'  (1.6 m), weight 84 kg, SpO2 100 %. Incision is clean and dry upper extremity strength is stable motor function is stable  Disposition: Discharge disposition: 01-Home or Self Care       Discharge Instructions    Call MD for:  redness, tenderness, or signs of infection (pain, swelling, redness, odor or green/yellow discharge around incision site)   Complete by: As directed    Call MD for:  severe uncontrolled pain   Complete by: As directed    Call MD for:  temperature >100.4   Complete by: As directed    Diet - low sodium heart healthy   Complete by: As directed    Discharge wound care:   Complete by: As directed    Okay to shower. Do not apply salves or appointments to incision. No heavy lifting with the upper extremities greater than 5 pounds. May resume driving when not requiring pain medication and patient feels comfortable with doing so.   Increase activity slowly   Complete by: As directed      Allergies as of 04/21/2020      Reactions   Piroxicam Hives   Propoxyphene N-acetaminophen Hives   Terfenadine    REACTION: UNKNOWN REACTION      Medication List    TAKE these medications   Accu-Chek Guide test strip Generic drug: glucose blood USE TO TEST  BLOOD SUGAR ONCE A DAY   allopurinol 100 MG tablet Commonly known as: ZYLOPRIM Take 1 tablet (100 mg total) by mouth daily.   aspirin EC 81 MG tablet Take 243 mg by mouth daily.   atorvastatin 10 MG tablet Commonly known as: LIPITOR TAKE 1 TABLET(10 MG) BY MOUTH DAILY What changed: See the new instructions.   azelastine 0.1 % nasal spray Commonly known as: ASTELIN Place 2 sprays into both nostrils 2 (two) times daily. Use in each nostril as directed What changed:   when to take this  reasons to take this   blood glucose meter kit and supplies Dispense based on patient and insurance preference. Test once daily (FOR ICD-10 E10.9, E11.9).   cholecalciferol 25 MCG (1000 UNIT) tablet Commonly known as: VITAMIN D3 Take 1,000 Units by mouth daily.   fluticasone 50 MCG/ACT nasal spray Commonly known as: Flonase Place 2 sprays into the nose daily. What changed:   when to take this  reasons to take this   gabapentin 100 MG capsule Commonly known as: NEURONTIN Take 100 mg by mouth at bedtime as needed (pain). Takes one at bedtime as needed, from none / week to 3 times per week   metFORMIN 1000 MG tablet Commonly known as: GLUCOPHAGE TAKE 1 TABLET BY MOUTH TWICE DAILY   montelukast 10 MG tablet Commonly known as: SINGULAIR Take 1 tablet (10 mg total) by mouth at bedtime.   omeprazole 20 MG capsule Commonly known as: PRILOSEC TAKE 1 CAPSULE BY MOUTH DAILY  ondansetron 4 MG tablet Commonly known as: ZOFRAN Take 1 tablet (4 mg total) by mouth every 6 (six) hours as needed for nausea or vomiting.   oxyCODONE 5 MG immediate release tablet Commonly known as: Oxy IR/ROXICODONE Take 1-2 tablets (5-10 mg total) by mouth every 3 (three) hours as needed for moderate pain or severe pain ((score 4 to 6)).   potassium chloride 10 MEQ tablet Commonly known as: KLOR-CON TAKE 1 TABLET(10 MEQ) BY MOUTH TWICE DAILY What changed: See the new instructions.   ramipril 10 MG  capsule Commonly known as: ALTACE TAKE 1 CAPSULE(10 MG) BY MOUTH DAILY What changed: See the new instructions.   triamterene-hydrochlorothiazide 75-50 MG tablet Commonly known as: MAXZIDE TAKE 1 TABLET BY MOUTH DAILY   UNABLE TO FIND Diabetic shoes x 1 pair inserts x 3 pair   UNABLE TO FIND Mastectomy bras x 6 pair Bilateral breast prothesis  DX. Z85.3   vitamin B-12 1000 MCG tablet Commonly known as: CYANOCOBALAMIN Take 1,000 mcg by mouth daily.            Durable Medical Equipment  (From admission, onward)         Start     Ordered   04/21/20 1130  For home use only DME 3 n 1  Once        04/21/20 1133   04/21/20 1130  For home use only DME Walker rolling  Once       Question Answer Comment  Walker: With 5 Inch Wheels   Patient needs a walker to treat with the following condition S/P cervical spinal fusion      04/21/20 1133           Discharge Care Instructions  (From admission, onward)         Start     Ordered   04/21/20 0000  Discharge wound care:       Comments: Okay to shower. Do not apply salves or appointments to incision. No heavy lifting with the upper extremities greater than 5 pounds. May resume driving when not requiring pain medication and patient feels comfortable with doing so.   04/21/20 1401           Signed: Blanchie Dessert Nekita Pita 04/21/2020, 2:01 PM

## 2020-04-21 NOTE — Progress Notes (Signed)
Occupational Therapy Treatment Patient Details Name: Lisa Mann MRN: [id number] DOB: Jun 01, 1933 Today's Date: 04/21/2020    History of present illness 87 yo female who presented 4/12 with numbness, tingling, weakness and gait instability. 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 S/p C1-2 decompression and fusion 4/13. PMH including breast cancer s/p bil mastectomy (2010), DM type 2, DJD, L wrist fx (2005), and HTN.   OT comments  Pt making steady progress towards OT goals this session. Session focus on education related to LB AE in order to maintain cervical precautions. Pt with no recall of precautions therefore provided full education on precautions and compensatory methods for maintaining precautions during BADLs. Full demonstration provided on LB AE for bathing and dressing with pt verbalizing understanding. Pt currently requires MAX A to don brace and MOD A for LB ADLs as pt able to figure four with LLE but not RLE. Pt would benefit from further practice with LB AE and tub transfer to tub transfer bench next session. DC plan currently remains appropriate, will continue to follow acutely per POC.    Follow Up Recommendations  No OT follow up;Supervision/Assistance - 24 hour    Equipment Recommendations  Tub/shower bench;Other (comment) (May not need tub bench pending practice)    Recommendations for Other Services      Precautions / Restrictions Precautions Precautions: Fall;Cervical Precaution Booklet Issued: Yes (comment) Precaution Comments: Verbally reviewed cervical precautions Required Braces or Orthoses: Cervical Brace Cervical Brace: Hard collar Restrictions Weight Bearing Restrictions: No       Mobility Bed Mobility Overal bed mobility: Needs Assistance Bed Mobility: Supine to Sit           General bed mobility comments: MIN A to elevate trunk    Transfers                      Balance Overall balance assessment: Needs assistance Sitting-balance  support: No upper extremity supported;Feet supported Sitting balance-Leahy Scale: Fair Sitting balance - Comments: sitting EOB to eat breakfast with no UE support or LOB                                   ADL either performed or assessed with clinical judgement   ADL Overall ADL's : Needs assistance/impaired Eating/Feeding: Set up;Sitting     Grooming Details (indicate cue type and reason): Educating pt on use of cup for oral care.       Lower Body Bathing Details (indicate cue type and reason): education provided on LB AE for bathing in order to maintain cervical precautions Upper Body Dressing : Sitting;Maximal assistance Upper Body Dressing Details (indicate cue type and reason): Max A to don brace.   Lower Body Dressing Details (indicate cue type and reason): pt able to figure four with RLE but needs assist with LLE, education provided on all LB AE for dressing with pt verbalizing understanding, issued pt handout of all items to increase carryover       Toileting - Clothing Manipulation Details (indicate cue type and reason): education provided on AD for hygiene as needed, additionally demo'ed compensatory methods for hygiene in order to maintain precautions       General ADL Comments: session focus on education related to LB AE in order to maintain cervical precautions     Vision       Perception     Praxis  Cognition Arousal/Alertness: Awake/alert Behavior During Therapy: WFL for tasks assessed/performed Overall Cognitive Status: Within Functional Limits for tasks assessed                                          Exercises     Shoulder Instructions       General Comments issued pt handout on LB AE and cervical precautions, incision appears clean and dry, donned brace at start of session and provided education on changing pads for brace    Pertinent Vitals/ Pain       Pain Assessment: Faces Faces Pain Scale: Hurts a little  bit Pain Location: L shoulder Pain Descriptors / Indicators: Discomfort;Sore Pain Intervention(s): Monitored during session;Repositioned;Limited activity within patient's tolerance  Home Living                                          Prior Functioning/Environment              Frequency           Progress Toward Goals  OT Goals(current goals can now be found in the care plan section)  Progress towards OT goals: Progressing toward goals  Acute Rehab OT Goals Patient Stated Goal: none stated Time For Goal Achievement: 05/04/20 Potential to Achieve Goals: Good  Plan Discharge plan remains appropriate;Frequency remains appropriate    Co-evaluation                 AM-PAC OT "6 Clicks" Daily Activity     Outcome Measure   Help from another person eating meals?: None Help from another person taking care of personal grooming?: A Little Help from another person toileting, which includes using toliet, bedpan, or urinal?: A Little Help from another person bathing (including washing, rinsing, drying)?: A Little Help from another person to put on and taking off regular upper body clothing?: A Little Help from another person to put on and taking off regular lower body clothing?: A Lot 6 Click Score: 18    End of Session Equipment Utilized During Treatment: Cervical collar;Other (comment) (LB AE)  OT Visit Diagnosis: Unsteadiness on feet (R26.81);Other abnormalities of gait and mobility (R26.89);Muscle weakness (generalized) (M62.81);Pain Pain - Right/Left: Left Pain - part of body: Shoulder   Activity Tolerance Patient tolerated treatment well   Patient Left in bed;with call bell/phone within reach;Other (comment) (sitting EOB to eat breakfast)   Nurse Communication Mobility status        Time: 4081-4481 OT Time Calculation (min): 19 min  Charges: OT General Charges $OT Visit: 1 Visit OT Treatments $Self Care/Home Management : 8-22  mins  Lisa Mann., COTA/L Acute Rehabilitation Services [phone]    Lisa Mann 04/21/2020, 9:00 AM

## 2020-04-24 ENCOUNTER — Telehealth: Payer: Self-pay

## 2020-04-24 NOTE — Telephone Encounter (Signed)
Error

## 2020-04-25 ENCOUNTER — Telehealth: Payer: Self-pay

## 2020-04-25 NOTE — Telephone Encounter (Signed)
Transition Care Management Follow-up Telephone Call  Date of discharge and from where: [facility] 04[facility]20  How have you been since you were released from the hospital?   Up and down. Fair  Any questions or concerns? No  Items Reviewed:  Did the pt receive and understand the discharge instructions provided? Yes   Medications obtained and verified? Yes   Other? No   Any new allergies since your discharge? No   Dietary orders reviewed? Yes  Do you have support at home? Yes daughter is helping while on spring break this week   [place] and Equipment/Supplies: Were home health services ordered? yes If so, what is the name of the agency? [facility]  Has the agency set up a time to come to the patient's home? no Were any new equipment or medical supplies ordered?  Yes: walker What is the name of the medical supply agency? [facility] Were you able to get the supplies/equipment? yes Do you have any questions related to the use of the equipment or supplies? No  Functional Questionnaire: (I = Independent and D = Dependent) ADLs: daughter helps her   Bathing/Dressing- daughter helps her   Meal Prep- daughter helping her  Eating- I- not eating a lot   Maintaining continence- I  Transferring/Ambulation- daughter helping her- supposed to be starting therapy   Managing Meds- D  Follow up appointments reviewed:   PCP Hospital f/u appt confirmed? Front staff to call daughter to schedule toc appt  [facility] f/u appt confirmed? Yes  Scheduled to see [facility] on 05/16/20  Are transportation arrangements needed? No   If their condition worsens, is the pt aware to call PCP or go to the Emergency Dept.? Yes  Was the patient provided with contact information for the PCP's office or ED? Yes  Was to pt encouraged to call back with questions or concerns? Yes

## 2020-04-26 ENCOUNTER — Telehealth: Payer: Self-pay

## 2020-04-26 DIAGNOSIS — E119 Type 2 diabetes mellitus without complications: Secondary | ICD-10-CM | POA: Diagnosis not present

## 2020-04-26 DIAGNOSIS — I10 Essential (primary) hypertension: Secondary | ICD-10-CM | POA: Diagnosis not present

## 2020-04-26 DIAGNOSIS — M4802 Spinal stenosis, cervical region: Secondary | ICD-10-CM | POA: Diagnosis not present

## 2020-04-26 DIAGNOSIS — Z4789 Encounter for other orthopedic aftercare: Secondary | ICD-10-CM | POA: Diagnosis not present

## 2020-04-26 DIAGNOSIS — M5416 Radiculopathy, lumbar region: Secondary | ICD-10-CM | POA: Diagnosis not present

## 2020-04-26 NOTE — Telephone Encounter (Signed)
Ben from [facility] called needs ok on plan of care and some of medications interaction??. Please contact Ben at [phone].

## 2020-04-27 NOTE — Telephone Encounter (Signed)
Interaction with her potassium with triamterene. If there needs to be any change I will need to notify him, otherwise its just an FYI they have to report

## 2020-04-27 NOTE — Telephone Encounter (Signed)
No change she is maintained on this

## 2020-05-01 ENCOUNTER — Other Ambulatory Visit: Payer: Self-pay

## 2020-05-01 ENCOUNTER — Encounter: Payer: Self-pay | Admitting: Family Medicine

## 2020-05-01 ENCOUNTER — Ambulatory Visit (INDEPENDENT_AMBULATORY_CARE_PROVIDER_SITE_OTHER): Payer: Medicare Other | Admitting: Family Medicine

## 2020-05-01 VITALS — BP 131/67 | HR 92 | Temp 98.0°F | Ht 63.0 in | Wt 170.0 lb

## 2020-05-01 DIAGNOSIS — Z09 Encounter for follow-up examination after completed treatment for conditions other than malignant neoplasm: Secondary | ICD-10-CM

## 2020-05-01 NOTE — Patient Instructions (Signed)
F/U as before, call if you need me before  Thankful that surgery has been successful, continue to make forward progress  Be careful not to fall  Thanks for choosing [facility], we consider it a privelige to serve you.

## 2020-05-02 DIAGNOSIS — M4802 Spinal stenosis, cervical region: Secondary | ICD-10-CM | POA: Diagnosis not present

## 2020-05-02 DIAGNOSIS — Z4789 Encounter for other orthopedic aftercare: Secondary | ICD-10-CM | POA: Diagnosis not present

## 2020-05-02 DIAGNOSIS — I10 Essential (primary) hypertension: Secondary | ICD-10-CM | POA: Diagnosis not present

## 2020-05-02 DIAGNOSIS — M5416 Radiculopathy, lumbar region: Secondary | ICD-10-CM | POA: Diagnosis not present

## 2020-05-02 DIAGNOSIS — E119 Type 2 diabetes mellitus without complications: Secondary | ICD-10-CM | POA: Diagnosis not present

## 2020-05-05 DIAGNOSIS — Z4789 Encounter for other orthopedic aftercare: Secondary | ICD-10-CM | POA: Diagnosis not present

## 2020-05-05 DIAGNOSIS — E119 Type 2 diabetes mellitus without complications: Secondary | ICD-10-CM | POA: Diagnosis not present

## 2020-05-05 DIAGNOSIS — M5416 Radiculopathy, lumbar region: Secondary | ICD-10-CM | POA: Diagnosis not present

## 2020-05-05 DIAGNOSIS — M4802 Spinal stenosis, cervical region: Secondary | ICD-10-CM | POA: Diagnosis not present

## 2020-05-05 DIAGNOSIS — I10 Essential (primary) hypertension: Secondary | ICD-10-CM | POA: Diagnosis not present

## 2020-05-05 NOTE — Progress Notes (Signed)
   Lisa Mann     MRN: [id number]      DOB: [birth year]   HPI [name] is here for follow up of recent hospitalization for posterior decompression and fusion of atlantoaxial instability and stenosis. She was admitted from /13 to 04/21/2020 I stable condition and has continued to do well at home She is receiving in home PT/OT, currently using a walker, has had no falls, is regaining strength in both lower extremities and has had no falls or near falls. Denies any current headache Appetite is good and BM regular The PT denies any adverse reactions to current medications since the last visit.  There are no new concerns.  There are no specific complaints   ROS Denies recent fever or chills. Denies sinus pressure, nasal congestion, ear pain or sore throat. Denies chest congestion, productive cough or wheezing. Denies chest pains, palpitations and leg swelling Denies abdominal pain, nausea, vomiting,diarrhea or constipation.   Denies dysuria, frequency, hesitancy or incontinence. C/o  limitation in mobility. Denies headaches, seizures, numbness, or tingling. Denies depression, anxiety or insomnia. Denies skin break down or rash.   PE  BP 131/67 (BP Location: Right Arm, Patient Position: Sitting, Cuff Size: Normal)   Pulse 92   Temp 98 F (36.7 C) (Temporal)   Ht 5\' 3"  (1.6 m)   Wt 170 lb (77.1 kg)   SpO2 94%   BMI 30.11 kg/m   Patient alert and oriented and in no cardiopulmonary distress.  HEENT: No facial asymmetry, EOMI,     Neck supple .  Chest: Clear to auscultation bilaterally.  CVS: S1, S2 no murmurs, no S3.Regular rate.  ABD: Soft non tender.   Ext: No edema  MS: decreased  ROM spine, shoulders, hips and knees.  Skin: Intact, no ulcerations or rash noted.  Psych: Good eye contact, normal affect. Memory intact not anxious or depressed appearing.  CNS: CN 2-12 intact, power,  normal throughout.no focal deficits noted.   [facility]  discharge follow-up Patient in for follow up of recent hospitalization. Discharge summary, and laboratory and radiology data are reviewed, and any questions or concerns  are discussed. Specific issues requiring follow up are specifically addressed.

## 2020-05-06 ENCOUNTER — Encounter: Payer: Self-pay | Admitting: Family Medicine

## 2020-05-06 DIAGNOSIS — Z09 Encounter for follow-up examination after completed treatment for conditions other than malignant neoplasm: Secondary | ICD-10-CM | POA: Insufficient documentation

## 2020-05-06 NOTE — Assessment & Plan Note (Signed)
Patient in for follow up of recent hospitalization. Discharge summary, and laboratory and radiology data are reviewed, and any questions or concerns  are discussed. Specific issues requiring follow up are specifically addressed.  

## 2020-05-08 DIAGNOSIS — M5416 Radiculopathy, lumbar region: Secondary | ICD-10-CM | POA: Diagnosis not present

## 2020-05-08 DIAGNOSIS — M4802 Spinal stenosis, cervical region: Secondary | ICD-10-CM | POA: Diagnosis not present

## 2020-05-08 DIAGNOSIS — I10 Essential (primary) hypertension: Secondary | ICD-10-CM | POA: Diagnosis not present

## 2020-05-08 DIAGNOSIS — E119 Type 2 diabetes mellitus without complications: Secondary | ICD-10-CM | POA: Diagnosis not present

## 2020-05-08 DIAGNOSIS — Z4789 Encounter for other orthopedic aftercare: Secondary | ICD-10-CM | POA: Diagnosis not present

## 2020-05-11 ENCOUNTER — Other Ambulatory Visit: Payer: Self-pay | Admitting: Family Medicine

## 2020-05-11 DIAGNOSIS — I10 Essential (primary) hypertension: Secondary | ICD-10-CM | POA: Diagnosis not present

## 2020-05-11 DIAGNOSIS — M5416 Radiculopathy, lumbar region: Secondary | ICD-10-CM | POA: Diagnosis not present

## 2020-05-11 DIAGNOSIS — Z4789 Encounter for other orthopedic aftercare: Secondary | ICD-10-CM | POA: Diagnosis not present

## 2020-05-11 DIAGNOSIS — E119 Type 2 diabetes mellitus without complications: Secondary | ICD-10-CM | POA: Diagnosis not present

## 2020-05-11 DIAGNOSIS — M4802 Spinal stenosis, cervical region: Secondary | ICD-10-CM | POA: Diagnosis not present

## 2020-05-16 DIAGNOSIS — M4802 Spinal stenosis, cervical region: Secondary | ICD-10-CM | POA: Diagnosis not present

## 2020-05-23 DIAGNOSIS — L110 Acquired keratosis follicularis: Secondary | ICD-10-CM | POA: Diagnosis not present

## 2020-05-23 DIAGNOSIS — E1151 Type 2 diabetes mellitus with diabetic peripheral angiopathy without gangrene: Secondary | ICD-10-CM | POA: Diagnosis not present

## 2020-05-23 DIAGNOSIS — M79671 Pain in right foot: Secondary | ICD-10-CM | POA: Diagnosis not present

## 2020-05-23 DIAGNOSIS — E1140 Type 2 diabetes mellitus with diabetic neuropathy, unspecified: Secondary | ICD-10-CM | POA: Diagnosis not present

## 2020-05-23 DIAGNOSIS — I739 Peripheral vascular disease, unspecified: Secondary | ICD-10-CM | POA: Diagnosis not present

## 2020-05-23 DIAGNOSIS — M79672 Pain in left foot: Secondary | ICD-10-CM | POA: Diagnosis not present

## 2020-06-06 ENCOUNTER — Other Ambulatory Visit (HOSPITAL_COMMUNITY): Payer: Self-pay | Admitting: Surgery

## 2020-06-06 DIAGNOSIS — Z853 Personal history of malignant neoplasm of breast: Secondary | ICD-10-CM

## 2020-06-07 ENCOUNTER — Inpatient Hospital Stay (HOSPITAL_COMMUNITY): Payer: Medicare Other | Attending: Hematology

## 2020-06-07 ENCOUNTER — Other Ambulatory Visit: Payer: Self-pay | Admitting: Family Medicine

## 2020-06-07 ENCOUNTER — Other Ambulatory Visit: Payer: Self-pay

## 2020-06-07 DIAGNOSIS — Z79899 Other long term (current) drug therapy: Secondary | ICD-10-CM | POA: Insufficient documentation

## 2020-06-07 DIAGNOSIS — Z853 Personal history of malignant neoplasm of breast: Secondary | ICD-10-CM | POA: Insufficient documentation

## 2020-06-07 DIAGNOSIS — Z8249 Family history of ischemic heart disease and other diseases of the circulatory system: Secondary | ICD-10-CM | POA: Insufficient documentation

## 2020-06-07 DIAGNOSIS — I10 Essential (primary) hypertension: Secondary | ICD-10-CM | POA: Insufficient documentation

## 2020-06-07 DIAGNOSIS — M109 Gout, unspecified: Secondary | ICD-10-CM | POA: Insufficient documentation

## 2020-06-07 DIAGNOSIS — Z9049 Acquired absence of other specified parts of digestive tract: Secondary | ICD-10-CM | POA: Insufficient documentation

## 2020-06-07 DIAGNOSIS — Z8719 Personal history of other diseases of the digestive system: Secondary | ICD-10-CM | POA: Insufficient documentation

## 2020-06-07 DIAGNOSIS — Z9013 Acquired absence of bilateral breasts and nipples: Secondary | ICD-10-CM | POA: Diagnosis not present

## 2020-06-07 DIAGNOSIS — E785 Hyperlipidemia, unspecified: Secondary | ICD-10-CM | POA: Diagnosis not present

## 2020-06-07 DIAGNOSIS — Z923 Personal history of irradiation: Secondary | ICD-10-CM | POA: Insufficient documentation

## 2020-06-07 DIAGNOSIS — E119 Type 2 diabetes mellitus without complications: Secondary | ICD-10-CM | POA: Diagnosis not present

## 2020-06-07 LAB — CBC WITH DIFFERENTIAL/PLATELET
Abs Immature Granulocytes: 0.04 10*3/uL (ref 0.00–0.07)
Basophils Absolute: 0 10*3/uL (ref 0.0–0.1)
Basophils Relative: 1 %
Eosinophils Absolute: 0.1 10*3/uL (ref 0.0–0.5)
Eosinophils Relative: 2 %
HCT: 37.1 % (ref 36.0–46.0)
Hemoglobin: 11.9 g/dL — ABNORMAL LOW (ref 12.0–15.0)
Immature Granulocytes: 1 %
Lymphocytes Relative: 22 %
Lymphs Abs: 1.4 10*3/uL (ref 0.7–4.0)
MCH: 29.1 pg (ref 26.0–34.0)
MCHC: 32.1 g/dL (ref 30.0–36.0)
MCV: 90.7 fL (ref 80.0–100.0)
Monocytes Absolute: 0.6 10*3/uL (ref 0.1–1.0)
Monocytes Relative: 9 %
Neutro Abs: 4.1 10*3/uL (ref 1.7–7.7)
Neutrophils Relative %: 65 %
Platelets: 355 10*3/uL (ref 150–400)
RBC: 4.09 MIL/uL (ref 3.87–5.11)
RDW: 14.6 % (ref 11.5–15.5)
WBC: 6.3 10*3/uL (ref 4.0–10.5)
nRBC: 0 % (ref 0.0–0.2)

## 2020-06-07 LAB — COMPREHENSIVE METABOLIC PANEL
ALT: 16 U/L (ref 0–44)
AST: 18 U/L (ref 15–41)
Albumin: 4.1 g/dL (ref 3.5–5.0)
Alkaline Phosphatase: 47 U/L (ref 38–126)
Anion gap: 10 (ref 5–15)
BUN: 15 mg/dL (ref 8–23)
CO2: 28 mmol/L (ref 22–32)
Calcium: 9.6 mg/dL (ref 8.9–10.3)
Chloride: 98 mmol/L (ref 98–111)
Creatinine, Ser: 0.63 mg/dL (ref 0.44–1.00)
GFR, Estimated: >60 mL/min (ref >60)
Glucose, Bld: 109 mg/dL — ABNORMAL HIGH (ref 70–99)
Potassium: 4.2 mmol/L (ref 3.5–5.1)
Sodium: 136 mmol/L (ref 135–145)
Total Bilirubin: 0.4 mg/dL (ref 0.3–1.2)
Total Protein: 7.4 g/dL (ref 6.5–8.1)

## 2020-06-07 LAB — VITAMIN B12: Vitamin B-12: 859 pg/mL (ref 180–914)

## 2020-06-07 LAB — VITAMIN D 25 HYDROXY (VIT D DEFICIENCY, FRACTURES): Vit D, 25-Hydroxy: 57.31 ng/mL (ref 30–100)

## 2020-06-07 LAB — LACTATE DEHYDROGENASE: LDH: 140 U/L (ref 98–192)

## 2020-06-08 LAB — PTH, INTACT AND CALCIUM
Calcium, Total (PTH): 10.1 mg/dL (ref 8.7–10.3)
PTH: 20 pg/mL (ref 15–65)

## 2020-06-11 ENCOUNTER — Other Ambulatory Visit: Payer: Self-pay | Admitting: Family Medicine

## 2020-06-12 ENCOUNTER — Other Ambulatory Visit: Payer: Self-pay | Admitting: Family Medicine

## 2020-06-13 ENCOUNTER — Ambulatory Visit: Payer: Medicare Other | Admitting: Family Medicine

## 2020-06-14 ENCOUNTER — Other Ambulatory Visit: Payer: Self-pay

## 2020-06-14 ENCOUNTER — Inpatient Hospital Stay (HOSPITAL_COMMUNITY): Payer: Medicare Other | Admitting: Hematology and Oncology

## 2020-06-14 ENCOUNTER — Ambulatory Visit (HOSPITAL_COMMUNITY): Payer: Medicare Other | Admitting: Nurse Practitioner

## 2020-06-14 ENCOUNTER — Encounter (HOSPITAL_COMMUNITY): Payer: Self-pay | Admitting: Hematology and Oncology

## 2020-06-14 DIAGNOSIS — Z853 Personal history of malignant neoplasm of breast: Secondary | ICD-10-CM

## 2020-06-14 DIAGNOSIS — Z923 Personal history of irradiation: Secondary | ICD-10-CM | POA: Diagnosis not present

## 2020-06-14 DIAGNOSIS — Z8249 Family history of ischemic heart disease and other diseases of the circulatory system: Secondary | ICD-10-CM | POA: Diagnosis not present

## 2020-06-14 DIAGNOSIS — I10 Essential (primary) hypertension: Secondary | ICD-10-CM | POA: Diagnosis not present

## 2020-06-14 DIAGNOSIS — Z9013 Acquired absence of bilateral breasts and nipples: Secondary | ICD-10-CM | POA: Diagnosis not present

## 2020-06-14 DIAGNOSIS — Z8719 Personal history of other diseases of the digestive system: Secondary | ICD-10-CM | POA: Diagnosis not present

## 2020-06-14 DIAGNOSIS — E785 Hyperlipidemia, unspecified: Secondary | ICD-10-CM | POA: Diagnosis not present

## 2020-06-14 DIAGNOSIS — Z9049 Acquired absence of other specified parts of digestive tract: Secondary | ICD-10-CM | POA: Diagnosis not present

## 2020-06-14 DIAGNOSIS — M109 Gout, unspecified: Secondary | ICD-10-CM | POA: Diagnosis not present

## 2020-06-14 DIAGNOSIS — E119 Type 2 diabetes mellitus without complications: Secondary | ICD-10-CM | POA: Diagnosis not present

## 2020-06-14 DIAGNOSIS — Z79899 Other long term (current) drug therapy: Secondary | ICD-10-CM | POA: Diagnosis not present

## 2020-06-14 NOTE — Assessment & Plan Note (Signed)
The patient is a long-term cancer survivor Her chest wall examination reveals well-healed bilateral mastectomy scars with no other abnormalities We discussed the risk and benefits of future follow-up Ultimately, she is comfortable to follow-up with primary care doctor only The patient is encouraged to call us if she have questions or concerns requiring future appointment
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
## 2020-06-21 DIAGNOSIS — E0510 Thyrotoxicosis with toxic single thyroid nodule without thyrotoxic crisis or storm: Secondary | ICD-10-CM | POA: Diagnosis not present

## 2020-06-22 LAB — T4, FREE: Free T4: 0.95 ng/dL (ref 0.82–1.77)

## 2020-06-22 LAB — TSH: TSH: 4.51 u[IU]/mL — ABNORMAL HIGH (ref 0.450–4.500)

## 2020-06-22 LAB — T3, FREE: T3, Free: 2.9 pg/mL (ref 2.0–4.4)

## 2020-06-26 ENCOUNTER — Other Ambulatory Visit: Payer: Self-pay | Admitting: Family Medicine

## 2020-06-28 ENCOUNTER — Encounter: Payer: Self-pay | Admitting: "Endocrinology

## 2020-06-28 ENCOUNTER — Ambulatory Visit: Payer: Medicare Other | Admitting: "Endocrinology

## 2020-06-28 ENCOUNTER — Other Ambulatory Visit: Payer: Self-pay

## 2020-06-28 VITALS — BP 132/70 | HR 88 | Ht 63.0 in | Wt 168.0 lb

## 2020-06-28 DIAGNOSIS — E0510 Thyrotoxicosis with toxic single thyroid nodule without thyrotoxic crisis or storm: Secondary | ICD-10-CM

## 2020-06-28 DIAGNOSIS — E049 Nontoxic goiter, unspecified: Secondary | ICD-10-CM | POA: Diagnosis not present
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
## 2020-07-04 ENCOUNTER — Other Ambulatory Visit: Payer: Self-pay

## 2020-07-04 ENCOUNTER — Ambulatory Visit (INDEPENDENT_AMBULATORY_CARE_PROVIDER_SITE_OTHER): Payer: Medicare Other | Admitting: Family Medicine

## 2020-07-04 ENCOUNTER — Encounter: Payer: Self-pay | Admitting: Family Medicine

## 2020-07-04 VITALS — BP 136/72 | HR 98 | Temp 98.4°F | Resp 20 | Ht 63.0 in | Wt 167.0 lb

## 2020-07-04 DIAGNOSIS — E790 Hyperuricemia without signs of inflammatory arthritis and tophaceous disease: Secondary | ICD-10-CM

## 2020-07-04 DIAGNOSIS — E785 Hyperlipidemia, unspecified: Secondary | ICD-10-CM

## 2020-07-04 DIAGNOSIS — J302 Other seasonal allergic rhinitis: Secondary | ICD-10-CM | POA: Diagnosis not present

## 2020-07-04 DIAGNOSIS — E1140 Type 2 diabetes mellitus with diabetic neuropathy, unspecified: Secondary | ICD-10-CM | POA: Diagnosis not present

## 2020-07-04 DIAGNOSIS — I10 Essential (primary) hypertension: Secondary | ICD-10-CM | POA: Diagnosis not present

## 2020-07-04 MED ORDER — ASPIRIN 81 MG PO TBEC: 81.0000 mg | DELAYED_RELEASE_TABLET | Freq: Every day | ORAL | 12 refills | Status: AC

## 2020-07-04 MED ORDER — ALLOPURINOL 300 MG PO TABS: ORAL_TABLET | ORAL | 6 refills | Status: DC

## 2020-07-04 NOTE — Patient Instructions (Addendum)
Annual exam in office with MD early September, flu vaccine at visit, call if you need me before  REDUCE aspirin to  ONE daily  Please reduce allopurinol 300 mg tablet to HALF daily  Fasting lipid, cmp and EGFR, HBA1C, uric acid and microalb level 3 to 5 days before September appointment   If you need help with appt for Dr Arnoldo Morale (neurosurgeon) please call and let us know ( September)  Be careful, no falls, use cane for stability  Thanks for choosing [facility], we consider it a privelige to serve you.

## 2020-07-09 ENCOUNTER — Encounter: Payer: Self-pay | Admitting: Family Medicine

## 2020-07-09 NOTE — Assessment & Plan Note (Signed)
Hyperlipidemia:Low fat diet discussed and encouraged.   Lipid Panel  Lab Results  Component Value Date   CHOL 172 03/15/2020   HDL 50 03/15/2020   LDLCALC 103 (H) 03/15/2020   TRIG 103 03/15/2020   CHOLHDL 3.4 03/15/2020     Needs to lower fat intake

## 2020-07-09 NOTE — Assessment & Plan Note (Signed)
Controlled, no change in medication Lisa Mann is reminded of the importance of commitment to daily physical activity for 30 minutes or more, as able and the need to limit carbohydrate intake to 30 to 60 grams per meal to help with blood sugar control.   The need to take medication as prescribed, test blood sugar as directed, and to call between visits if there is a concern that blood sugar is uncontrolled is also discussed.   Lisa Mann is reminded of the importance of daily foot exam, annual eye examination, and good blood sugar, blood pressure and cholesterol control.  Diabetic Labs Latest Ref Rng & Units 06/07/2020 04/19/2020 04/19/2020 03/15/2020 10/14/2019  HbA1c 4.8 - 5.6 % - - - 6.7(H) -  Microalbumin Not Estab. ug/mL - - - - -  Micro/Creat Ratio 0 - 29 mg/g creat - - - - -  Chol 100 - 199 mg/dL - - - 172 -  HDL >39 mg/dL - - - 50 -  Calc LDL 0 - 99 mg/dL - - - 103(H) -  Triglycerides 0 - 149 mg/dL - - - 103 -  Creatinine 0.44 - 1.00 mg/dL 0.63 0.60 0.71 0.69 0.63   BP/Weight 07/04/2020 06/28/2020 06/14/2020 05/01/2020 04/21/2020 04/19/2020 5/68/6168  Systolic BP 372 902 111 552 080 - 223  Diastolic BP 72 70 62 67 70 - 56  Wt. (Lbs) 167 168 168.8 170 - 185.19 175.4  BMI 29.58 29.76 29.9 30.11 - 32.8 31.07   Foot/eye exam completion dates Latest Ref Rng & Units 11/24/2019 07/08/2019  Eye Exam No Retinopathy No Retinopathy -  Foot exam Order - - -  Foot Form Completion - - Done

## 2020-07-09 NOTE — Assessment & Plan Note (Signed)
Controlled, no change in medication DASH diet and commitment to daily physical activity for a minimum of 30 minutes discussed and encouraged, as a part of hypertension management. The importance of attaining a healthy weight is also discussed.  BP/Weight 07/04/2020 06/28/2020 06/14/2020 05/01/2020 04/21/2020 04/19/2020 0/56/9794  Systolic BP 801 655 374 827 078 - 675  Diastolic BP 72 70 62 67 70 - 56  Wt. (Lbs) 167 168 168.8 170 - 185.19 175.4  BMI 29.58 29.76 29.9 30.11 - 32.8 31.07

## 2020-07-09 NOTE — Assessment & Plan Note (Signed)
Controlled, no change in medication  

## 2020-07-09 NOTE — Progress Notes (Signed)
Lisa Mann     MRN: [id number]      DOB: July 16, 1933   HPI Lisa Mann is here for follow up and re-evaluation of chronic medical conditions, medication management and review of any available recent lab and radiology data.  Preventive health is updated, specifically  Cancer screening and Immunization.   She continues to recover well from C spine surgery, and still uses a hard cervical collar throughout the day The PT denies any adverse reactions to current medications since the last visit.  Denies polyuria, polydipsia, blurred vision , or hypoglycemic episodes.    ROS Denies recent fever or chills. Denies sinus pressure, nasal congestion, ear pain or sore throat. Denies chest congestion, productive cough or wheezing. Denies chest pains, palpitations and leg swelling Denies abdominal pain, nausea, vomiting,diarrhea or constipation.   Denies dysuria, frequency, hesitancy or incontinence. Denies uncontrolled joint pain, swelling and limitation in mobility. Denies headaches, seizures, numbness, or tingling. Denies depression, anxiety or insomnia. Denies skin break down or rash.   PE  BP 136/72 (BP Location: Right Arm, Patient Position: Sitting, Cuff Size: Large)   Pulse 98   Temp 98.4 F (36.9 C)   Resp 20   Ht 5\' 3"  (1.6 m)   Wt 167 lb (75.8 kg)   SpO2 96%   BMI 29.58 kg/m   Patient alert and oriented and in no cardiopulmonary distress.  HEENT: No facial asymmetry, EOMI,     Neck decreased ROM in cervical collar.  Chest: Clear to auscultation bilaterally.  CVS: S1, S2 no murmurs, no S3.Regular rate.  ABD: Soft non tender.   Ext: No edema  MS: decreased  ROM spine, shoulders, hips and knees.  Skin: Intact, no ulcerations or rash noted.  Psych: Good eye contact, normal affect. Memory intact not anxious or depressed appearing.  CNS: CN 2-12 intact, power,  normal throughout.no focal deficits noted.   Assessment & Plan  HTN, goal below 130/80 Controlled, no  change in medication DASH diet and commitment to daily physical activity for a minimum of 30 minutes discussed and encouraged, as a part of hypertension management. The importance of attaining a healthy weight is also discussed.  BP/Weight 07/04/2020 06/28/2020 06/14/2020 05/01/2020 04/21/2020 04/19/2020 5/36/1443  Systolic BP 154 008 676 195 093 - 267  Diastolic BP 72 70 62 67 70 - 56  Wt. (Lbs) 167 168 168.8 170 - 185.19 175.4  BMI 29.58 29.76 29.9 30.11 - 32.8 31.07       Diabetes mellitus with neuropathy Controlled, no change in medication Lisa Mann is reminded of the importance of commitment to daily physical activity for 30 minutes or more, as able and the need to limit carbohydrate intake to 30 to 60 grams per meal to help with blood sugar control.   The need to take medication as prescribed, test blood sugar as directed, and to call between visits if there is a concern that blood sugar is uncontrolled is also discussed.   Lisa Mann is reminded of the importance of daily foot exam, annual eye examination, and good blood sugar, blood pressure and cholesterol control.  Diabetic Labs Latest Ref Rng & Units 06/07/2020 04/19/2020 04/19/2020 03/15/2020 10/14/2019  HbA1c 4.8 - 5.6 % - - - 6.7(H) -  Microalbumin Not Estab. ug/mL - - - - -  Micro/Creat Ratio 0 - 29 mg/g creat - - - - -  Chol 100 - 199 mg/dL - - - 172 -  HDL >39 mg/dL - - - 50 -  Calc LDL 0 - 99 mg/dL - - - 103(H) -  Triglycerides 0 - 149 mg/dL - - - 103 -  Creatinine 0.44 - 1.00 mg/dL 0.63 0.60 0.71 0.69 0.63   BP/Weight 07/04/2020 06/28/2020 06/14/2020 05/01/2020 04/21/2020 04/19/2020 8/52/7782  Systolic BP 423 536 144 315 400 - 867  Diastolic BP 72 70 62 67 70 - 56  Wt. (Lbs) 167 168 168.8 170 - 185.19 175.4  BMI 29.58 29.76 29.9 30.11 - 32.8 31.07   Foot/eye exam completion dates Latest Ref Rng & Units 11/24/2019 07/08/2019  Eye Exam No Retinopathy No Retinopathy -  Foot exam Order - - -  Foot Form Completion - - Done         Allergic rhinitis Controlled, no change in medication   Hyperlipidemia with target LDL less than 100 Hyperlipidemia:Low fat diet discussed and encouraged.   Lipid Panel  Lab Results  Component Value Date   CHOL 172 03/15/2020   HDL 50 03/15/2020   LDLCALC 103 (H) 03/15/2020   TRIG 103 03/15/2020   CHOLHDL 3.4 03/15/2020     Needs to lower fat intake

## 2020-07-18 ENCOUNTER — Other Ambulatory Visit: Payer: Self-pay | Admitting: Family Medicine

## 2020-07-18 DIAGNOSIS — E1169 Type 2 diabetes mellitus with other specified complication: Secondary | ICD-10-CM

## 2020-07-18 DIAGNOSIS — Z23 Encounter for immunization: Secondary | ICD-10-CM

## 2020-07-18 DIAGNOSIS — I10 Essential (primary) hypertension: Secondary | ICD-10-CM

## 2020-07-18 DIAGNOSIS — E785 Hyperlipidemia, unspecified: Secondary | ICD-10-CM

## 2020-07-19 ENCOUNTER — Other Ambulatory Visit: Payer: Self-pay | Admitting: Family Medicine

## 2020-08-08 DIAGNOSIS — M79672 Pain in left foot: Secondary | ICD-10-CM | POA: Diagnosis not present

## 2020-08-08 DIAGNOSIS — E1140 Type 2 diabetes mellitus with diabetic neuropathy, unspecified: Secondary | ICD-10-CM | POA: Diagnosis not present

## 2020-08-08 DIAGNOSIS — I739 Peripheral vascular disease, unspecified: Secondary | ICD-10-CM | POA: Diagnosis not present

## 2020-08-08 DIAGNOSIS — M79671 Pain in right foot: Secondary | ICD-10-CM | POA: Diagnosis not present

## 2020-08-08 DIAGNOSIS — E1151 Type 2 diabetes mellitus with diabetic peripheral angiopathy without gangrene: Secondary | ICD-10-CM | POA: Diagnosis not present

## 2020-08-08 DIAGNOSIS — L110 Acquired keratosis follicularis: Secondary | ICD-10-CM | POA: Diagnosis not present

## 2020-08-16 DIAGNOSIS — E0510 Thyrotoxicosis with toxic single thyroid nodule without thyrotoxic crisis or storm: Secondary | ICD-10-CM | POA: Diagnosis not present

## 2020-08-17 ENCOUNTER — Telehealth: Payer: Self-pay | Admitting: Family Medicine

## 2020-08-17 LAB — TSH: TSH: 10.3 u[IU]/mL — ABNORMAL HIGH (ref 0.450–4.500)

## 2020-08-17 LAB — T4, FREE: Free T4: 0.87 ng/dL (ref 0.82–1.77)

## 2020-08-17 LAB — T3, FREE: T3, Free: 2.7 pg/mL (ref 2.0–4.4)

## 2020-08-17 NOTE — Telephone Encounter (Signed)
Pt needs a script for diabetic shoes sent to [facility]

## 2020-08-21 ENCOUNTER — Other Ambulatory Visit: Payer: Self-pay

## 2020-08-22 ENCOUNTER — Encounter: Payer: Self-pay | Admitting: "Endocrinology

## 2020-08-22 ENCOUNTER — Other Ambulatory Visit: Payer: Self-pay

## 2020-08-22 ENCOUNTER — Ambulatory Visit: Payer: Medicare Other | Admitting: "Endocrinology

## 2020-08-22 VITALS — BP 131/77 | HR 91 | Ht 63.0 in | Wt 171.0 lb

## 2020-08-22 DIAGNOSIS — E890 Postprocedural hypothyroidism: Secondary | ICD-10-CM

## 2020-08-22 MED ORDER — LEVOTHYROXINE SODIUM 25 MCG PO TABS: 25.0000 ug | ORAL_TABLET | Freq: Every day | ORAL | 1 refills | Status: DC
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
## 2020-08-22 NOTE — Telephone Encounter (Signed)
Contacted patient, patient asked to keep original appointment October 5th.

## 2020-09-18 ENCOUNTER — Encounter: Payer: Medicare Other | Admitting: Family Medicine

## 2020-09-19 ENCOUNTER — Ambulatory Visit (INDEPENDENT_AMBULATORY_CARE_PROVIDER_SITE_OTHER): Payer: Medicare Other

## 2020-09-19 ENCOUNTER — Other Ambulatory Visit: Payer: Self-pay

## 2020-09-19 DIAGNOSIS — Z0000 Encounter for general adult medical examination without abnormal findings: Secondary | ICD-10-CM | POA: Diagnosis not present
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
## 2020-09-19 NOTE — Patient Instructions (Signed)
Health Maintenance, Female Adopting a healthy lifestyle and getting preventive care are important in promoting health and wellness. Ask your health care provider about: The right schedule for you to have regular tests and exams. Things you can do on your own to prevent diseases and keep yourself healthy. What should I know about diet, weight, and exercise? Eat a healthy diet  Eat a diet that includes plenty of vegetables, fruits, low-fat dairy products, and lean protein. Do not eat a lot of foods that are high in solid fats, added sugars, or sodium. Maintain a healthy weight Body mass index (BMI) is used to identify weight problems. It estimates body fat based on height and weight. Your health care provider can help determine your BMI and help you achieve or maintain a healthy weight. Get regular exercise Get regular exercise. This is one of the most important things you can do for your health. Most adults should: Exercise for at least 150 minutes each week. The exercise should increase your heart rate and make you sweat (moderate-intensity exercise). Do strengthening exercises at least twice a week. This is in addition to the moderate-intensity exercise. Spend less time sitting. Even light physical activity can be beneficial. Watch cholesterol and blood lipids Have your blood tested for lipids and cholesterol at 85 years of age, then have this test every 5 years. Have your cholesterol levels checked more often if: Your lipid or cholesterol levels are high. You are older than 85 years of age. You are at high risk for heart disease. What should I know about cancer screening? Depending on your health history and family history, you may need to have cancer screening at various ages. This may include screening for: Breast cancer. Cervical cancer. Colorectal cancer. Skin cancer. Lung cancer. What should I know about heart disease, diabetes, and high blood pressure? Blood pressure and heart  disease High blood pressure causes heart disease and increases the risk of stroke. This is more likely to develop in people who have high blood pressure readings, are of African descent, or are overweight. Have your blood pressure checked: Every 3-5 years if you are 18-39 years of age. Every year if you are 40 years old or older. Diabetes Have regular diabetes screenings. This checks your fasting blood sugar level. Have the screening done: Once every three years after age 40 if you are at a normal weight and have a low risk for diabetes. More often and at a younger age if you are overweight or have a high risk for diabetes. What should I know about preventing infection? Hepatitis B If you have a higher risk for hepatitis B, you should be screened for this virus. Talk with your health care provider to find out if you are at risk for hepatitis B infection. Hepatitis C Testing is recommended for: Everyone born from 1945 through 1965. Anyone with known risk factors for hepatitis C. Sexually transmitted infections (STIs) Get screened for STIs, including gonorrhea and chlamydia, if: You are sexually active and are younger than 85 years of age. You are older than 85 years of age and your health care provider tells you that you are at risk for this type of infection. Your sexual activity has changed since you were last screened, and you are at increased risk for chlamydia or gonorrhea. Ask your health care provider if you are at risk. Ask your health care provider about whether you are at high risk for HIV. Your health care provider may recommend a prescription medicine   to help prevent HIV infection. If you choose to take medicine to prevent HIV, you should first get tested for HIV. You should then be tested every 3 months for as long as you are taking the medicine. Pregnancy If you are about to stop having your period (premenopausal) and you may become pregnant, seek counseling before you get  pregnant. Take 400 to 800 micrograms (mcg) of folic acid every day if you become pregnant. Ask for birth control (contraception) if you want to prevent pregnancy. Osteoporosis and menopause Osteoporosis is a disease in which the bones lose minerals and strength with aging. This can result in bone fractures. If you are 65 years old or older, or if you are at risk for osteoporosis and fractures, ask your health care provider if you should: Be screened for bone loss. Take a calcium or vitamin D supplement to lower your risk of fractures. Be given hormone replacement therapy (HRT) to treat symptoms of menopause. Follow these instructions at home: Lifestyle Do not use any products that contain nicotine or tobacco, such as cigarettes, e-cigarettes, and chewing tobacco. If you need help quitting, ask your health care provider. Do not use street drugs. Do not share needles. Ask your health care provider for help if you need support or information about quitting drugs. Alcohol use Do not drink alcohol if: Your health care provider tells you not to drink. You are pregnant, may be pregnant, or are planning to become pregnant. If you drink alcohol: Limit how much you use to 0-1 drink a day. Limit intake if you are breastfeeding. Be aware of how much alcohol is in your drink. In the [place] one drink equals one 12 oz bottle of beer (355 mL), one 5 oz glass of wine (148 mL), or one 1 oz glass of hard liquor (44 mL). General instructions Schedule regular health, dental, and eye exams. Stay current with your vaccines. Tell your health care provider if: You often feel depressed. You have ever been abused or do not feel safe at home. Summary Adopting a healthy lifestyle and getting preventive care are important in promoting health and wellness. Follow your health care provider's instructions about healthy diet, exercising, and getting tested or screened for diseases. Follow your health care provider's  instructions on monitoring your cholesterol and blood pressure. This information is not intended to replace advice given to you by your health care provider. Make sure you discuss any questions you have with your health care provider. Document Revised: 03/03/2020 Document Reviewed: 12/17/2017 Elsevier Patient Education  2022 [employer]  

## 2020-09-26 DIAGNOSIS — M532X1 Spinal instabilities, occipito-atlanto-axial region: Secondary | ICD-10-CM | POA: Diagnosis not present

## 2020-09-26 DIAGNOSIS — I10 Essential (primary) hypertension: Secondary | ICD-10-CM | POA: Diagnosis not present

## 2020-10-03 DIAGNOSIS — E785 Hyperlipidemia, unspecified: Secondary | ICD-10-CM | POA: Diagnosis not present

## 2020-10-03 DIAGNOSIS — E790 Hyperuricemia without signs of inflammatory arthritis and tophaceous disease: Secondary | ICD-10-CM | POA: Diagnosis not present

## 2020-10-03 DIAGNOSIS — E1140 Type 2 diabetes mellitus with diabetic neuropathy, unspecified: Secondary | ICD-10-CM | POA: Diagnosis not present

## 2020-10-05 LAB — LIPID PANEL
Chol/HDL Ratio: 3.2 ratio (ref 0.0–4.4)
Cholesterol, Total: 185 mg/dL (ref 100–199)
HDL: 58 mg/dL (ref >39)
LDL Chol Calc (NIH): 111 mg/dL — ABNORMAL HIGH (ref 0–99)
Triglycerides: 86 mg/dL (ref 0–149)
VLDL Cholesterol Cal: 16 mg/dL (ref 5–40)

## 2020-10-05 LAB — CMP14+EGFR
ALT: 9 IU/L (ref 0–32)
AST: 14 IU/L (ref 0–40)
Albumin/Globulin Ratio: 1.6 (ref 1.2–2.2)
Albumin: 4.4 g/dL (ref 3.6–4.6)
Alkaline Phosphatase: 59 IU/L (ref 44–121)
BUN/Creatinine Ratio: 22 (ref 12–28)
BUN: 16 mg/dL (ref 8–27)
Bilirubin Total: 0.3 mg/dL (ref 0.0–1.2)
CO2: 26 mmol/L (ref 20–29)
Calcium: 9.9 mg/dL (ref 8.7–10.3)
Chloride: 97 mmol/L (ref 96–106)
Creatinine, Ser: 0.74 mg/dL (ref 0.57–1.00)
Globulin, Total: 2.8 g/dL (ref 1.5–4.5)
Glucose: 108 mg/dL — ABNORMAL HIGH (ref 70–99)
Potassium: 5.4 mmol/L — ABNORMAL HIGH (ref 3.5–5.2)
Sodium: 139 mmol/L (ref 134–144)
Total Protein: 7.2 g/dL (ref 6.0–8.5)
eGFR: 79 mL/min/{1.73_m2} (ref >59)

## 2020-10-05 LAB — HEMOGLOBIN A1C
Est. average glucose Bld gHb Est-mCnc: 146 mg/dL
Hgb A1c MFr Bld: 6.7 % — ABNORMAL HIGH (ref 4.8–5.6)

## 2020-10-05 LAB — MICROALBUMIN / CREATININE URINE RATIO
Creatinine, Urine: 73.3 mg/dL
Microalb/Creat Ratio: 5 mg/g creat (ref 0–29)
Microalbumin, Urine: 3.8 ug/mL

## 2020-10-05 LAB — URIC ACID: Uric Acid: 4.8 mg/dL (ref 3.1–7.9)

## 2020-10-11 ENCOUNTER — Other Ambulatory Visit: Payer: Self-pay

## 2020-10-11 ENCOUNTER — Ambulatory Visit (INDEPENDENT_AMBULATORY_CARE_PROVIDER_SITE_OTHER): Payer: Medicare Other | Admitting: Family Medicine

## 2020-10-11 ENCOUNTER — Encounter: Payer: Self-pay | Admitting: Family Medicine

## 2020-10-11 VITALS — BP 136/74 | HR 99 | Resp 16 | Ht 63.0 in | Wt 168.0 lb

## 2020-10-11 DIAGNOSIS — E1140 Type 2 diabetes mellitus with diabetic neuropathy, unspecified: Secondary | ICD-10-CM | POA: Diagnosis not present

## 2020-10-11 DIAGNOSIS — E785 Hyperlipidemia, unspecified: Secondary | ICD-10-CM

## 2020-10-11 DIAGNOSIS — Z9013 Acquired absence of bilateral breasts and nipples: Secondary | ICD-10-CM | POA: Insufficient documentation

## 2020-10-11 DIAGNOSIS — E538 Deficiency of other specified B group vitamins: Secondary | ICD-10-CM

## 2020-10-11 DIAGNOSIS — Z23 Encounter for immunization: Secondary | ICD-10-CM

## 2020-10-11 DIAGNOSIS — I10 Essential (primary) hypertension: Secondary | ICD-10-CM

## 2020-10-11 DIAGNOSIS — Z0001 Encounter for general adult medical examination with abnormal findings: Secondary | ICD-10-CM | POA: Insufficient documentation

## 2020-10-11 MED ORDER — POTASSIUM CHLORIDE ER 10 MEQ PO TBCR: 10.0000 meq | EXTENDED_RELEASE_TABLET | Freq: Every day | ORAL | 3 refills | Status: DC

## 2020-10-11 MED ORDER — ALLOPURINOL 100 MG PO TABS: 100.0000 mg | ORAL_TABLET | Freq: Every day | ORAL | 3 refills | Status: DC

## 2020-10-11 MED ORDER — UNABLE TO FIND: 0 refills | Status: DC

## 2020-10-11 NOTE — Progress Notes (Signed)
Lisa Mann     MRN: [id number]      DOB: 15-Oct-1933  HPI: Patient is in for annual physical exam. Need for diabetic shoes and mastectomy bras is addressed at this visit. Recent labs,  are reviewed. Immunization is reviewed , and  updated    PE: BP 136/74   Pulse 99   Resp 16   Ht 5\' 3"  (1.6 m)   Wt 168 lb (76.2 kg)   SpO2 94%   BMI 29.76 kg/m   Pleasant  female, alert and oriented x 3, in no cardio-pulmonary distress. Afebrile. HEENT No facial trauma or asymetry. Sinuses non tender.  Extra occullar muscles intact.. External ears normal, . Neck: decreased  ROM, no adenopathy,JVD or thyromegaly.No bruits.  Chest: Clear to ascultation bilaterally.No crackles or wheezes. Non tender to palpation  Breast: Bilateral mastectomy  Cardiovascular system; Heart sounds normal,  S1 and  S2 ,no S3.  No murmur, or thrill. Apical beat not displaced Peripheral pulses normal.  Abdomen: Soft, non tender, .    Musculoskeletal exam: Decreased rOM spine , shoulders, hips , knees, deformity ,swelling and  crepitus noted. No muscle wasting or atrophy.   Neurologic: Cranial nerves 2 to 12 intact. Power, tone ,sensation and reflexes normal throughout.  disturbance in gait. No tremor.  Skin: Intact, no ulceration, erythema , scaling or rash noted. Pigmentation normal throughout  Psych; Normal mood and affect. Judgement and concentration normal   Assessment & Plan:  Diabetes mellitus with neuropathy Lisa Mann is reminded of the importance of commitment to daily physical activity for 30 minutes or more, as able and the need to limit carbohydrate intake to 30 to 60 grams per meal to help with blood sugar control.   The need to take medication as prescribed, test blood sugar as directed, and to call between visits if there is a concern that blood sugar is uncontrolled is also discussed.   Lisa Mann is reminded of the importance of daily foot exam, annual eye examination,  and good blood sugar, blood pressure and cholesterol control.  Diabetic Labs Latest Ref Rng & Units 10/03/2020 06/07/2020 04/19/2020 04/19/2020 03/15/2020  HbA1c 4.8 - 5.6 % 6.7(H) - - - 6.7(H)  Microalbumin Not Estab. ug/mL - - - - -  Micro/Creat Ratio 0 - 29 mg/g creat 5 - - - -  Chol 100 - 199 mg/dL 185 - - - 172  HDL >39 mg/dL 58 - - - 50  Calc LDL 0 - 99 mg/dL 111(H) - - - 103(H)  Triglycerides 0 - 149 mg/dL 86 - - - 103  Creatinine 0.57 - 1.00 mg/dL 0.74 0.63 0.60 0.71 0.69   BP/Weight 10/11/2020 08/22/2020 07/04/2020 06/28/2020 06/14/2020 05/01/2020 2/70/6237  Systolic BP 628 315 176 160 737 106 269  Diastolic BP 74 77 72 70 62 67 70  Wt. (Lbs) 168 171 167 168 168.8 170 -  BMI 29.76 30.29 29.58 29.76 29.9 30.11 -   Foot/eye exam completion dates Latest Ref Rng & Units 10/11/2020 11/24/2019  Eye Exam No Retinopathy - No Retinopathy  Foot exam Order - - -  Foot Form Completion - Done -        S/P mastectomy, bilateral Needs mastectomy bras and prosthesis, rx to be written  Annual visit for general adult medical examination with abnormal findings Annual exam as documented. Counseling done  re healthy lifestyle involving commitment to 150 minutes exercise per week, heart healthy diet, and attaining healthy weight.The importance of adequate sleep  also discussed. Regular seat belt use and home safety, is also discussed. Changes in health habits are decided on by the patient with goals and time frames  set for achieving them. Immunization and cancer screening needs are specifically addressed at this visit.

## 2020-10-11 NOTE — Assessment & Plan Note (Signed)
Needs mastectomy bras and prosthesis, rx to be written

## 2020-10-11 NOTE — Assessment & Plan Note (Signed)

## 2020-10-11 NOTE — Assessment & Plan Note (Signed)
Lisa Mann is reminded of the importance of commitment to daily physical activity for 30 minutes or more, as able and the need to limit carbohydrate intake to 30 to 60 grams per meal to help with blood sugar control.   The need to take medication as prescribed, test blood sugar as directed, and to call between visits if there is a concern that blood sugar is uncontrolled is also discussed.   Lisa Mann is reminded of the importance of daily foot exam, annual eye examination, and good blood sugar, blood pressure and cholesterol control.  Diabetic Labs Latest Ref Rng & Units 10/03/2020 06/07/2020 04/19/2020 04/19/2020 03/15/2020  HbA1c 4.8 - 5.6 % 6.7(H) - - - 6.7(H)  Microalbumin Not Estab. ug/mL - - - - -  Micro/Creat Ratio 0 - 29 mg/g creat 5 - - - -  Chol 100 - 199 mg/dL 185 - - - 172  HDL >39 mg/dL 58 - - - 50  Calc LDL 0 - 99 mg/dL 111(H) - - - 103(H)  Triglycerides 0 - 149 mg/dL 86 - - - 103  Creatinine 0.57 - 1.00 mg/dL 0.74 0.63 0.60 0.71 0.69   BP/Weight 10/11/2020 08/22/2020 07/04/2020 06/28/2020 06/14/2020 05/01/2020 04/05/1914  Systolic BP 606 004 599 774 142 395 320  Diastolic BP 74 77 72 70 62 67 70  Wt. (Lbs) 168 171 167 168 168.8 170 -  BMI 29.76 30.29 29.58 29.76 29.9 30.11 -   Foot/eye exam completion dates Latest Ref Rng & Units 10/11/2020 11/24/2019  Eye Exam No Retinopathy - No Retinopathy  Foot exam Order - - -  Foot Form Completion - Done -

## 2020-10-11 NOTE — Patient Instructions (Signed)
Annual exam with MD in 366 days.   Follow-up upper end of February call if you need me before.  Flu vaccine in office today.  Nurse please obtain a document Tdap vaccine that patient received in the past month at her [place] on [address].  Continue to examine feet daily.  Please reduce fried fried and fatty foods your bad cholesterol is slightly elevated.  Labs and exam is otherwise excellent.    Please commit to doing chair exercises for 10 minutes twice daily and hopefully will enjoy the process.  Change in potassium to 1 tablet once daily.  Change in calcium Caltrate to 2 tablets once daily.  CBC fasting lipids CMP and EGFR and HbA1c and B12 levels to be drawn 5 days before your next visit.  Prescriptions form mastectomy bras and diabetic shoes to be provided with this visit.  Thanks for choosing [facility], we consider it a privelige to serve you.

## 2020-10-12 ENCOUNTER — Other Ambulatory Visit: Payer: Self-pay

## 2020-10-12 MED ORDER — UNABLE TO FIND: 0 refills | Status: DC

## 2020-10-16 ENCOUNTER — Other Ambulatory Visit: Payer: Self-pay | Admitting: Family Medicine

## 2020-11-07 DIAGNOSIS — M79672 Pain in left foot: Secondary | ICD-10-CM | POA: Diagnosis not present

## 2020-11-07 DIAGNOSIS — E1151 Type 2 diabetes mellitus with diabetic peripheral angiopathy without gangrene: Secondary | ICD-10-CM | POA: Diagnosis not present

## 2020-11-07 DIAGNOSIS — L110 Acquired keratosis follicularis: Secondary | ICD-10-CM | POA: Diagnosis not present

## 2020-11-07 DIAGNOSIS — M79671 Pain in right foot: Secondary | ICD-10-CM | POA: Diagnosis not present

## 2020-11-07 DIAGNOSIS — I739 Peripheral vascular disease, unspecified: Secondary | ICD-10-CM | POA: Diagnosis not present

## 2020-11-07 DIAGNOSIS — E1140 Type 2 diabetes mellitus with diabetic neuropathy, unspecified: Secondary | ICD-10-CM | POA: Diagnosis not present

## 2020-11-17 DIAGNOSIS — E890 Postprocedural hypothyroidism: Secondary | ICD-10-CM | POA: Diagnosis not present

## 2020-11-18 LAB — T4, FREE: Free T4: 1.13 ng/dL (ref 0.82–1.77)

## 2020-11-18 LAB — TSH: TSH: 7.2 u[IU]/mL — ABNORMAL HIGH (ref 0.450–4.500)

## 2020-11-24 ENCOUNTER — Other Ambulatory Visit: Payer: Self-pay

## 2020-11-24 ENCOUNTER — Encounter: Payer: Self-pay | Admitting: "Endocrinology

## 2020-11-24 ENCOUNTER — Ambulatory Visit: Payer: Medicare Other | Admitting: "Endocrinology

## 2020-11-24 VITALS — BP 125/71 | HR 85 | Ht 63.0 in | Wt 171.6 lb

## 2020-11-24 DIAGNOSIS — E890 Postprocedural hypothyroidism: Secondary | ICD-10-CM | POA: Diagnosis not present

## 2020-11-24 MED ORDER — LEVOTHYROXINE SODIUM 50 MCG PO TABS: 50.0000 ug | ORAL_TABLET | Freq: Every day | ORAL | 1 refills | Status: DC
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
## 2020-11-29 DIAGNOSIS — H2512 Age-related nuclear cataract, left eye: Secondary | ICD-10-CM | POA: Diagnosis not present

## 2020-11-29 DIAGNOSIS — H04123 Dry eye syndrome of bilateral lacrimal glands: Secondary | ICD-10-CM | POA: Diagnosis not present

## 2020-11-29 DIAGNOSIS — E119 Type 2 diabetes mellitus without complications: Secondary | ICD-10-CM | POA: Diagnosis not present

## 2020-11-29 LAB — HM DIABETES EYE EXAM

## 2020-12-11 ENCOUNTER — Telehealth: Payer: Self-pay | Admitting: *Deleted

## 2020-12-11 NOTE — Chronic Care Management (AMB) (Signed)
  Chronic Care Management   Outreach Note  12/11/2020 Name: Lisa Mann MRN: [id number] DOB: Feb 25, 1933  Lisa Mann is a 87 y.o. year old female who is a primary care patient of Moshe Cipro Norwood Levo, MD. I reached out to Lisa Mann by phone today in response to a referral sent by Lisa Mann's primary care provider.  An unsuccessful telephone outreach was attempted today. The patient was referred to the case management team for assistance with care management and care coordination.   Follow Up Plan: The care management team will reach out to the patient again over the next 7 days.  If patient returns call to provider office, please advise to call [place]* at [phone].*  [facility] Management  Direct Dial: [phone]

## 2020-12-15 DIAGNOSIS — E1140 Type 2 diabetes mellitus with diabetic neuropathy, unspecified: Secondary | ICD-10-CM | POA: Diagnosis not present

## 2020-12-15 DIAGNOSIS — M2142 Flat foot [pes planus] (acquired), left foot: Secondary | ICD-10-CM | POA: Diagnosis not present

## 2020-12-15 DIAGNOSIS — M2141 Flat foot [pes planus] (acquired), right foot: Secondary | ICD-10-CM | POA: Diagnosis not present

## 2020-12-15 DIAGNOSIS — E119 Type 2 diabetes mellitus without complications: Secondary | ICD-10-CM | POA: Diagnosis not present

## 2020-12-18 NOTE — Chronic Care Management (AMB) (Signed)
  Chronic Care Management   Outreach Note  12/18/2020 Name: Lisa Mann MRN: [id number] DOB: October 04, 1933  Lisa Mann is a 87 y.o. year old female who is a primary care patient of Moshe Cipro Norwood Levo, MD. I reached out to Lisa Mann by phone today in response to a referral sent by [name]'s primary care provider.  A second unsuccessful telephone outreach was attempted today. The patient was referred to the case management team for assistance with care management and care coordination.   Follow Up Plan: A HIPAA compliant phone message was left for the patient providing contact information and requesting a return call. The care management team will reach out to the patient again over the next 7 days. If patient returns call to provider office, please advise to call [place] at [phone].  [place] Management  Direct Dial: [phone]

## 2020-12-25 NOTE — Chronic Care Management (AMB) (Signed)
°  Chronic Care Management   Outreach Note  12/25/2020 Name: Lisa Mann MRN: [id number] DOB: 1933/04/08  Lisa Mann is a 87 y.o. year old female who is a primary care patient of Moshe Cipro Norwood Levo, MD. I reached out to Lisa Mann by phone today in response to a referral sent by Lisa Mann's primary care provider.  Third unsuccessful telephone outreach was attempted today. The patient was referred to the case management team for assistance with care management and care coordination. The patient's primary care provider has been notified of our unsuccessful attempts to make or maintain contact with the patient. The care management team is pleased to engage with this patient at any time in the future should he/she be interested in assistance from the care management team.   Follow Up Plan: We have been unable to make contact with the patient. The care management team is available to follow up with the patient after provider conversation with the patient regarding recommendation for care management engagement and subsequent re-referral to the care management team.   [place] Management  Direct Dial: [phone]

## 2021-01-13 ENCOUNTER — Other Ambulatory Visit: Payer: Self-pay | Admitting: Family Medicine

## 2021-01-13 DIAGNOSIS — E1169 Type 2 diabetes mellitus with other specified complication: Secondary | ICD-10-CM

## 2021-01-13 DIAGNOSIS — I10 Essential (primary) hypertension: Secondary | ICD-10-CM

## 2021-01-13 DIAGNOSIS — Z23 Encounter for immunization: Secondary | ICD-10-CM

## 2021-01-13 DIAGNOSIS — E785 Hyperlipidemia, unspecified: Secondary | ICD-10-CM

## 2021-01-18 DIAGNOSIS — H268 Other specified cataract: Secondary | ICD-10-CM | POA: Diagnosis not present

## 2021-01-18 DIAGNOSIS — H25812 Combined forms of age-related cataract, left eye: Secondary | ICD-10-CM | POA: Diagnosis not present

## 2021-01-29 ENCOUNTER — Telehealth: Payer: Self-pay

## 2021-01-29 ENCOUNTER — Other Ambulatory Visit: Payer: Self-pay

## 2021-01-29 MED ORDER — ACCU-CHEK GUIDE VI STRP: ORAL_STRIP | 5 refills | Status: DC

## 2021-01-29 MED ORDER — BLOOD GLUCOSE METER KIT: PACK | 0 refills | Status: DC

## 2021-01-29 NOTE — Telephone Encounter (Signed)
Called in new rx

## 2021-01-29 NOTE — Telephone Encounter (Signed)
Lisa Mann called and said her meter is not working and needs another one asap.  She has not tested in several days and feels she needs to test asap.  Please call her to call in a new meter or if we have a meter.

## 2021-02-08 DIAGNOSIS — M79671 Pain in right foot: Secondary | ICD-10-CM | POA: Diagnosis not present

## 2021-02-08 DIAGNOSIS — I739 Peripheral vascular disease, unspecified: Secondary | ICD-10-CM | POA: Diagnosis not present

## 2021-02-08 DIAGNOSIS — M79672 Pain in left foot: Secondary | ICD-10-CM | POA: Diagnosis not present

## 2021-02-08 DIAGNOSIS — E1140 Type 2 diabetes mellitus with diabetic neuropathy, unspecified: Secondary | ICD-10-CM | POA: Diagnosis not present

## 2021-02-08 DIAGNOSIS — L110 Acquired keratosis follicularis: Secondary | ICD-10-CM | POA: Diagnosis not present

## 2021-02-08 DIAGNOSIS — E1151 Type 2 diabetes mellitus with diabetic peripheral angiopathy without gangrene: Secondary | ICD-10-CM | POA: Diagnosis not present

## 2021-02-20 DIAGNOSIS — E890 Postprocedural hypothyroidism: Secondary | ICD-10-CM | POA: Diagnosis not present

## 2021-02-21 LAB — T4, FREE: Free T4: 1.2 ng/dL (ref 0.82–1.77)

## 2021-02-21 LAB — TSH: TSH: 3.61 u[IU]/mL (ref 0.450–4.500)

## 2021-02-27 ENCOUNTER — Other Ambulatory Visit: Payer: Self-pay | Admitting: "Endocrinology

## 2021-02-27 DIAGNOSIS — E890 Postprocedural hypothyroidism: Secondary | ICD-10-CM | POA: Diagnosis not present

## 2021-02-28 ENCOUNTER — Ambulatory Visit: Payer: Medicare Other | Admitting: "Endocrinology

## 2021-02-28 ENCOUNTER — Other Ambulatory Visit: Payer: Self-pay

## 2021-02-28 ENCOUNTER — Encounter: Payer: Self-pay | Admitting: "Endocrinology

## 2021-02-28 VITALS — BP 116/64 | HR 92 | Ht 63.0 in | Wt 173.0 lb

## 2021-02-28 DIAGNOSIS — E890 Postprocedural hypothyroidism: Secondary | ICD-10-CM | POA: Diagnosis not present

## 2021-02-28 LAB — T4, FREE: Free T4: 1.02 ng/dL (ref 0.82–1.77)

## 2021-02-28 LAB — TSH: TSH: 8.31 u[IU]/mL — ABNORMAL HIGH (ref 0.450–4.500)

## 2021-02-28 MED ORDER — LEVOTHYROXINE SODIUM 75 MCG PO TABS: 75.0000 ug | ORAL_TABLET | Freq: Every day | ORAL | 1 refills | Status: DC
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
## 2021-03-03 ENCOUNTER — Other Ambulatory Visit: Payer: Self-pay | Admitting: Family Medicine

## 2021-03-06 ENCOUNTER — Other Ambulatory Visit: Payer: Self-pay

## 2021-03-06 ENCOUNTER — Ambulatory Visit (INDEPENDENT_AMBULATORY_CARE_PROVIDER_SITE_OTHER): Payer: Medicare Other | Admitting: Family Medicine

## 2021-03-06 ENCOUNTER — Encounter: Payer: Self-pay | Admitting: Family Medicine

## 2021-03-06 VITALS — BP 135/66 | HR 95 | Resp 16 | Ht 63.0 in | Wt 173.0 lb

## 2021-03-06 DIAGNOSIS — E785 Hyperlipidemia, unspecified: Secondary | ICD-10-CM

## 2021-03-06 DIAGNOSIS — M79622 Pain in left upper arm: Secondary | ICD-10-CM | POA: Diagnosis not present

## 2021-03-06 DIAGNOSIS — Z9013 Acquired absence of bilateral breasts and nipples: Secondary | ICD-10-CM | POA: Diagnosis not present

## 2021-03-06 DIAGNOSIS — E669 Obesity, unspecified: Secondary | ICD-10-CM

## 2021-03-06 DIAGNOSIS — I10 Essential (primary) hypertension: Secondary | ICD-10-CM

## 2021-03-06 DIAGNOSIS — E559 Vitamin D deficiency, unspecified: Secondary | ICD-10-CM

## 2021-03-06 DIAGNOSIS — E1140 Type 2 diabetes mellitus with diabetic neuropathy, unspecified: Secondary | ICD-10-CM | POA: Diagnosis not present

## 2021-03-06 DIAGNOSIS — M109 Gout, unspecified: Secondary | ICD-10-CM | POA: Diagnosis not present

## 2021-03-06 MED ORDER — GABAPENTIN 100 MG PO CAPS: 100.0000 mg | ORAL_CAPSULE | Freq: Every day | ORAL | 5 refills | Status: DC

## 2021-03-06 MED ORDER — GABAPENTIN 100 MG PO CAPS: ORAL_CAPSULE | ORAL | 5 refills | Status: DC

## 2021-03-06 MED ORDER — UNABLE TO FIND: 0 refills | Status: DC

## 2021-03-06 NOTE — Assessment & Plan Note (Signed)
Keep upcoming neurosurgery follow up appt Start gabapentin 100 mg , one to two at night

## 2021-03-06 NOTE — Assessment & Plan Note (Signed)
No recent flare Updated lab needed at/ before next visit.

## 2021-03-06 NOTE — Assessment & Plan Note (Signed)
Requires mastectomy  Lisa Mann, script provided

## 2021-03-06 NOTE — Progress Notes (Signed)
Lisa Mann     MRN: [id number]      DOB: 02-25-1933   HPI Lisa Mann is here for follow up and re-evaluation of chronic medical conditions, medication management and review of any available recent lab and radiology data.  Preventive health is updated, specifically  Cancer screening and Immunization.   Questions or concerns regarding consultations or procedures which the PT has had in the interim are  addressed. The PT denies any adverse reactions to current medications since the last visit.  C/o daily left arm pain rated at 8 Denies polyuria, polydipsia, blurred vision , or hypoglycemic episodes.  Requests scripts for mastectomy bras, she has double mastectomy  ROS Denies recent fever or chills. Denies sinus pressure, nasal congestion, ear pain or sore throat. Denies chest congestion, productive cough or wheezing. Denies chest pains, palpitations and leg swelling Denies abdominal pain, nausea, vomiting,diarrhea or constipation.   Denies dysuria, frequency, hesitancy or incontinence. . Denies headaches, seizures, numbness, or tingling. Denies depression, anxiety or insomnia. Denies skin break down or rash.   PE  BP 135/66    Pulse 95    Resp 16    Ht 5\' 3"  (1.6 m)    Wt 173 lb 0.6 oz (78.5 kg)    SpO2 97%    BMI 30.65 kg/m   Patient alert and oriented and in no cardiopulmonary distress.  HEENT: No facial asymmetry, EOMI,     Neck supple .  Chest: Clear to auscultation bilaterally.  CVS: S1, S2 no murmurs, no S3.Regular rate.  ABD: Soft non tender.   Ext: No edema  MS: Adequate though educed  ROM spine, shoulders, hips and knees.  Skin: Intact, no ulcerations or rash noted.  Psych: Good eye contact, normal affect. Memory intact not anxious or depressed appearing.  CNS: CN 2-12 intact, power,  normal throughout.no focal deficits noted.   Assessment & Plan  HTN, goal below 130/80 DASH diet and commitment to daily physical activity for a minimum of 30 minutes  discussed and encouraged, as a part of hypertension management. The importance of attaining a healthy weight is also discussed.  BP/Weight 03/06/2021 02/28/2021 11/24/2020 10/11/2020 08/22/2020 07/04/2020 06/03/7822  Systolic BP 235 361 443 154 008 676 195  Diastolic BP 66 64 71 74 77 72 70  Wt. (Lbs) 173.04 173 171.6 168 171 167 168  BMI 30.65 30.65 30.4 29.76 30.29 29.58 29.76     Controlled, no change in medication   History of bilateral mastectomy Requires mastectomy  [place], script provided  Diabetes mellitus with neuropathy Lisa Mann is reminded of the importance of commitment to daily physical activity for 30 minutes or more, as able and the need to limit carbohydrate intake to 30 to 60 grams per meal to help with blood sugar control.   The need to take medication as prescribed, test blood sugar as directed, and to call between visits if there is a concern that blood sugar is uncontrolled is also discussed.   Lisa Mann is reminded of the importance of daily foot exam, annual eye examination, and good blood sugar, blood pressure and cholesterol control. Updated lab needed at/ before next visit.   Diabetic Labs Latest Ref Rng & Units 10/03/2020 06/07/2020 04/19/2020 04/19/2020 03/15/2020  HbA1c 4.8 - 5.6 % 6.7(H) - - - 6.7(H)  Microalbumin Not Estab. ug/mL - - - - -  Micro/Creat Ratio 0 - 29 mg/g creat 5 - - - -  Chol 100 - 199 mg/dL 185 - - -  172  HDL >39 mg/dL 58 - - - 50  Calc LDL 0 - 99 mg/dL 111(H) - - - 103(H)  Triglycerides 0 - 149 mg/dL 86 - - - 103  Creatinine 0.57 - 1.00 mg/dL 0.74 0.63 0.60 0.71 0.69   BP/Weight 03/06/2021 02/28/2021 11/24/2020 10/11/2020 08/22/2020 07/04/2020 3/66/4403  Systolic BP 474 259 563 875 643 329 518  Diastolic BP 66 64 71 74 77 72 70  Wt. (Lbs) 173.04 173 171.6 168 171 167 168  BMI 30.65 30.65 30.4 29.76 30.29 29.58 29.76   Foot/eye exam completion dates Latest Ref Rng & Units 11/29/2020 10/11/2020  Eye Exam No Retinopathy No Retinopathy -  Foot  exam Order - - -  Foot Form Completion - - Done        Gout No recent flare Updated lab needed at/ before next visit.   Hyperlipidemia with target LDL less than 100 Hyperlipidemia:Low fat diet discussed and encouraged.   Lipid Panel  Lab Results  Component Value Date   CHOL 185 10/03/2020   HDL 58 10/03/2020   LDLCALC 111 (H) 10/03/2020   TRIG 86 10/03/2020   CHOLHDL 3.2 10/03/2020     not at goal Updated lab needed at/ before next visit.   Obesity (BMI 30.0-34.9)  Patient re-educated about  the importance of commitment to a  minimum of 150 minutes of exercise per week as able.  The importance of healthy food choices with portion control discussed, as well as eating regularly and within a 12 hour window most days. The need to choose "clean , green" food 50 to 75% of the time is discussed, as well as to make water the primary drink and set a goal of 64 ounces water daily.    Weight /BMI 03/06/2021 02/28/2021 11/24/2020  WEIGHT 173 lb 0.6 oz 173 lb 171 lb 9.6 oz  HEIGHT 5\' 3"  5\' 3"  5\' 3"   BMI 30.65 kg/m2 30.65 kg/m2 30.4 kg/m2      Left upper arm pain Keep upcoming neurosurgery follow up appt Start gabapentin 100 mg , one to two at night

## 2021-03-06 NOTE — Assessment & Plan Note (Signed)
DASH diet and commitment to daily physical activity for a minimum of 30 minutes discussed and encouraged, as a part of hypertension management. The importance of attaining a healthy weight is also discussed.  BP/Weight 03/06/2021 02/28/2021 11/24/2020 10/11/2020 08/22/2020 07/04/2020 6/38/6854  Systolic BP 883 014 159 733 125 087 199  Diastolic BP 66 64 71 74 77 72 70  Wt. (Lbs) 173.04 173 171.6 168 171 167 168  BMI 30.65 30.65 30.4 29.76 30.29 29.58 29.76     Controlled, no change in medication

## 2021-03-06 NOTE — Patient Instructions (Addendum)
Follow-up in August call if you need me sooner.  Vaccine at visit.    Nurse please provide Patient with prescription for mastectomy bras today.  Fasting lipids CMP and EGFR HbA1c vitamin D and uric acid level to be drawn this week. Just add uric acid level an d vit D to October New is gabapentin 100 mg 1-2 at bedtime for nerve pain.  It is important that you exercise regularly at least 30 minutes 5 times a week. If you develop chest pain, have severe difficulty breathing, or feel very tired, stop exercising immediately and seek medical attention  Think about what you will eat, plan ahead. Choose " clean, green, fresh or frozen" over canned, processed or packaged foods which are more sugary, salty and fatty. 70 to 75% of food eaten should be vegetables and fruit. Three meals at set times with snacks allowed between meals, but they must be fruit or vegetables. Aim to eat over a 12 hour period , example 7 am to 7 pm, and STOP after  your last meal of the day. Drink water,generally about 64 ounces per day, no other drink is as healthy. Fruit juice is best enjoyed in a healthy way, by EATING the fruit. Thanks for choosing [facility], we consider it a privelige to serve you.

## 2021-03-06 NOTE — Assessment & Plan Note (Signed)
°  Patient re-educated about  the importance of commitment to a  minimum of 150 minutes of exercise per week as able.  The importance of healthy food choices with portion control discussed, as well as eating regularly and within a 12 hour window most days. The need to choose "clean , green" food 50 to 75% of the time is discussed, as well as to make water the primary drink and set a goal of 64 ounces water daily.    Weight /BMI 03/06/2021 02/28/2021 11/24/2020  WEIGHT 173 lb 0.6 oz 173 lb 171 lb 9.6 oz  HEIGHT 5\' 3"  5\' 3"  5\' 3"   BMI 30.65 kg/m2 30.65 kg/m2 30.4 kg/m2

## 2021-03-06 NOTE — Assessment & Plan Note (Signed)
Hyperlipidemia:Low fat diet discussed and encouraged.   Lipid Panel  Lab Results  Component Value Date   CHOL 185 10/03/2020   HDL 58 10/03/2020   LDLCALC 111 (H) 10/03/2020   TRIG 86 10/03/2020   CHOLHDL 3.2 10/03/2020     not at goal Updated lab needed at/ before next visit.

## 2021-03-06 NOTE — Assessment & Plan Note (Signed)
Lisa Mann is reminded of the importance of commitment to daily physical activity for 30 minutes or more, as able and the need to limit carbohydrate intake to 30 to 60 grams per meal to help with blood sugar control.   The need to take medication as prescribed, test blood sugar as directed, and to call between visits if there is a concern that blood sugar is uncontrolled is also discussed.   Lisa Mann is reminded of the importance of daily foot exam, annual eye examination, and good blood sugar, blood pressure and cholesterol control. Updated lab needed at/ before next visit.   Diabetic Labs Latest Ref Rng & Units 10/03/2020 06/07/2020 04/19/2020 04/19/2020 03/15/2020  HbA1c 4.8 - 5.6 % 6.7(H) - - - 6.7(H)  Microalbumin Not Estab. ug/mL - - - - -  Micro/Creat Ratio 0 - 29 mg/g creat 5 - - - -  Chol 100 - 199 mg/dL 185 - - - 172  HDL >39 mg/dL 58 - - - 50  Calc LDL 0 - 99 mg/dL 111(H) - - - 103(H)  Triglycerides 0 - 149 mg/dL 86 - - - 103  Creatinine 0.57 - 1.00 mg/dL 0.74 0.63 0.60 0.71 0.69   BP/Weight 03/06/2021 02/28/2021 11/24/2020 10/11/2020 08/22/2020 07/04/2020 5/57/3220  Systolic BP 254 270 623 762 831 517 616  Diastolic BP 66 64 71 74 77 72 70  Wt. (Lbs) 173.04 173 171.6 168 171 167 168  BMI 30.65 30.65 30.4 29.76 30.29 29.58 29.76   Foot/eye exam completion dates Latest Ref Rng & Units 11/29/2020 10/11/2020  Eye Exam No Retinopathy No Retinopathy -  Foot exam Order - - -  Foot Form Completion - - Done

## 2021-03-07 DIAGNOSIS — E1140 Type 2 diabetes mellitus with diabetic neuropathy, unspecified: Secondary | ICD-10-CM | POA: Diagnosis not present

## 2021-03-07 DIAGNOSIS — E559 Vitamin D deficiency, unspecified: Secondary | ICD-10-CM | POA: Diagnosis not present

## 2021-03-07 DIAGNOSIS — E785 Hyperlipidemia, unspecified: Secondary | ICD-10-CM | POA: Diagnosis not present

## 2021-03-07 DIAGNOSIS — M109 Gout, unspecified: Secondary | ICD-10-CM | POA: Diagnosis not present

## 2021-03-08 ENCOUNTER — Other Ambulatory Visit: Payer: Self-pay | Admitting: Family Medicine

## 2021-03-08 LAB — CMP14+EGFR
ALT: 12 IU/L (ref 0–32)
AST: 17 IU/L (ref 0–40)
Albumin/Globulin Ratio: 1.6 (ref 1.2–2.2)
Albumin: 4.6 g/dL (ref 3.6–4.6)
Alkaline Phosphatase: 48 IU/L (ref 44–121)
BUN/Creatinine Ratio: 22 (ref 12–28)
BUN: 17 mg/dL (ref 8–27)
Bilirubin Total: 0.3 mg/dL (ref 0.0–1.2)
CO2: 25 mmol/L (ref 20–29)
Calcium: 10 mg/dL (ref 8.7–10.3)
Chloride: 99 mmol/L (ref 96–106)
Creatinine, Ser: 0.79 mg/dL (ref 0.57–1.00)
Globulin, Total: 2.8 g/dL (ref 1.5–4.5)
Glucose: 115 mg/dL — ABNORMAL HIGH (ref 70–99)
Potassium: 4.9 mmol/L (ref 3.5–5.2)
Sodium: 139 mmol/L (ref 134–144)
Total Protein: 7.4 g/dL (ref 6.0–8.5)
eGFR: 73 mL/min/{1.73_m2} (ref >59)

## 2021-03-08 LAB — VITAMIN D 25 HYDROXY (VIT D DEFICIENCY, FRACTURES): Vit D, 25-Hydroxy: 53 ng/mL (ref 30.0–100.0)

## 2021-03-08 LAB — HEMOGLOBIN A1C
Est. average glucose Bld gHb Est-mCnc: 148 mg/dL
Hgb A1c MFr Bld: 6.8 % — ABNORMAL HIGH (ref 4.8–5.6)

## 2021-03-08 LAB — LIPID PANEL
Chol/HDL Ratio: 2.9 ratio (ref 0.0–4.4)
Cholesterol, Total: 171 mg/dL (ref 100–199)
HDL: 60 mg/dL (ref >39)
LDL Chol Calc (NIH): 94 mg/dL (ref 0–99)
Triglycerides: 94 mg/dL (ref 0–149)
VLDL Cholesterol Cal: 17 mg/dL (ref 5–40)

## 2021-03-08 LAB — URIC ACID: Uric Acid: 1.9 mg/dL — ABNORMAL LOW (ref 3.1–7.9)

## 2021-03-08 MED ORDER — ALLOPURINOL 100 MG PO TABS: ORAL_TABLET | ORAL | 1 refills | Status: DC

## 2021-04-02 DIAGNOSIS — Z853 Personal history of malignant neoplasm of breast: Secondary | ICD-10-CM | POA: Diagnosis not present

## 2021-04-02 DIAGNOSIS — C50912 Malignant neoplasm of unspecified site of left female breast: Secondary | ICD-10-CM | POA: Diagnosis not present

## 2021-04-02 DIAGNOSIS — C50911 Malignant neoplasm of unspecified site of right female breast: Secondary | ICD-10-CM | POA: Diagnosis not present

## 2021-04-10 DIAGNOSIS — M532X1 Spinal instabilities, occipito-atlanto-axial region: Secondary | ICD-10-CM | POA: Diagnosis not present

## 2021-04-10 DIAGNOSIS — I10 Essential (primary) hypertension: Secondary | ICD-10-CM | POA: Diagnosis not present

## 2021-05-17 ENCOUNTER — Other Ambulatory Visit: Payer: Self-pay | Admitting: Family Medicine

## 2021-05-22 ENCOUNTER — Telehealth: Payer: Self-pay

## 2021-05-22 DIAGNOSIS — Z961 Presence of intraocular lens: Secondary | ICD-10-CM | POA: Diagnosis not present

## 2021-05-22 DIAGNOSIS — H04123 Dry eye syndrome of bilateral lacrimal glands: Secondary | ICD-10-CM | POA: Diagnosis not present

## 2021-05-22 DIAGNOSIS — H2511 Age-related nuclear cataract, right eye: Secondary | ICD-10-CM | POA: Diagnosis not present

## 2021-05-22 DIAGNOSIS — E119 Type 2 diabetes mellitus without complications: Secondary | ICD-10-CM | POA: Diagnosis not present

## 2021-05-22 NOTE — Telephone Encounter (Signed)
Patien called going to the foot doctor, 05.18.2023 and need to know her A1C. Please return patient call before her appointment on Thursday. ?

## 2021-05-22 NOTE — Telephone Encounter (Signed)
Spoke to patient gave her the last a1c check ?

## 2021-05-24 DIAGNOSIS — I739 Peripheral vascular disease, unspecified: Secondary | ICD-10-CM | POA: Diagnosis not present

## 2021-05-24 DIAGNOSIS — M79671 Pain in right foot: Secondary | ICD-10-CM | POA: Diagnosis not present

## 2021-05-24 DIAGNOSIS — E1151 Type 2 diabetes mellitus with diabetic peripheral angiopathy without gangrene: Secondary | ICD-10-CM | POA: Diagnosis not present

## 2021-05-24 DIAGNOSIS — L110 Acquired keratosis follicularis: Secondary | ICD-10-CM | POA: Diagnosis not present

## 2021-05-24 DIAGNOSIS — E1140 Type 2 diabetes mellitus with diabetic neuropathy, unspecified: Secondary | ICD-10-CM | POA: Diagnosis not present

## 2021-05-24 DIAGNOSIS — M79672 Pain in left foot: Secondary | ICD-10-CM | POA: Diagnosis not present

## 2021-06-03 ENCOUNTER — Other Ambulatory Visit: Payer: Self-pay | Admitting: "Endocrinology

## 2021-06-05 MED ORDER — LEVOTHYROXINE SODIUM 75 MCG PO TABS: 75.0000 ug | ORAL_TABLET | Freq: Every day | ORAL | 1 refills | Status: DC

## 2021-06-20 ENCOUNTER — Other Ambulatory Visit: Payer: Self-pay | Admitting: Family Medicine

## 2021-07-09 ENCOUNTER — Encounter: Payer: Self-pay | Admitting: Family Medicine

## 2021-07-09 ENCOUNTER — Telehealth: Payer: Self-pay | Admitting: Family Medicine

## 2021-07-09 NOTE — Telephone Encounter (Signed)
Will type letter for her to be excused

## 2021-07-09 NOTE — Telephone Encounter (Signed)
Pt called stating she is needing a letter to be exempted from [employer] duty. Can you please write this? (Pt is going to bring letter)

## 2021-07-11 ENCOUNTER — Telehealth: Payer: Self-pay | Admitting: Family Medicine

## 2021-07-11 ENCOUNTER — Encounter: Payer: Self-pay | Admitting: Family Medicine

## 2021-07-11 NOTE — Telephone Encounter (Signed)
Pt dropped off a letter form to be completed to be exempt from [employer].     (Forms copied/noted/sleeved)

## 2021-07-11 NOTE — Telephone Encounter (Signed)
Letter signed to excuse from jury duty

## 2021-07-12 NOTE — Telephone Encounter (Signed)
Called patient, she will come by and pick up the letter.

## 2021-07-14 ENCOUNTER — Other Ambulatory Visit: Payer: Self-pay | Admitting: Family Medicine

## 2021-07-14 DIAGNOSIS — E1169 Type 2 diabetes mellitus with other specified complication: Secondary | ICD-10-CM

## 2021-07-14 DIAGNOSIS — Z23 Encounter for immunization: Secondary | ICD-10-CM

## 2021-07-14 DIAGNOSIS — E785 Hyperlipidemia, unspecified: Secondary | ICD-10-CM

## 2021-07-14 DIAGNOSIS — I10 Essential (primary) hypertension: Secondary | ICD-10-CM

## 2021-07-17 DIAGNOSIS — M542 Cervicalgia: Secondary | ICD-10-CM | POA: Diagnosis not present

## 2021-07-17 DIAGNOSIS — M4802 Spinal stenosis, cervical region: Secondary | ICD-10-CM | POA: Diagnosis not present

## 2021-07-18 ENCOUNTER — Other Ambulatory Visit: Payer: Self-pay

## 2021-07-18 DIAGNOSIS — Z23 Encounter for immunization: Secondary | ICD-10-CM

## 2021-07-18 DIAGNOSIS — E785 Hyperlipidemia, unspecified: Secondary | ICD-10-CM

## 2021-07-18 DIAGNOSIS — I10 Essential (primary) hypertension: Secondary | ICD-10-CM

## 2021-07-18 DIAGNOSIS — E1169 Type 2 diabetes mellitus with other specified complication: Secondary | ICD-10-CM

## 2021-07-18 MED ORDER — RAMIPRIL 10 MG PO CAPS: ORAL_CAPSULE | ORAL | 1 refills | Status: DC

## 2021-07-21 ENCOUNTER — Other Ambulatory Visit: Payer: Self-pay | Admitting: Family Medicine

## 2021-08-07 ENCOUNTER — Other Ambulatory Visit: Payer: Self-pay | Admitting: Family Medicine

## 2021-08-22 DIAGNOSIS — E890 Postprocedural hypothyroidism: Secondary | ICD-10-CM | POA: Diagnosis not present

## 2021-08-23 DIAGNOSIS — E1140 Type 2 diabetes mellitus with diabetic neuropathy, unspecified: Secondary | ICD-10-CM | POA: Diagnosis not present

## 2021-08-23 DIAGNOSIS — E1151 Type 2 diabetes mellitus with diabetic peripheral angiopathy without gangrene: Secondary | ICD-10-CM | POA: Diagnosis not present

## 2021-08-23 DIAGNOSIS — M79672 Pain in left foot: Secondary | ICD-10-CM | POA: Diagnosis not present

## 2021-08-23 DIAGNOSIS — L110 Acquired keratosis follicularis: Secondary | ICD-10-CM | POA: Diagnosis not present

## 2021-08-23 DIAGNOSIS — I739 Peripheral vascular disease, unspecified: Secondary | ICD-10-CM | POA: Diagnosis not present

## 2021-08-23 DIAGNOSIS — M79671 Pain in right foot: Secondary | ICD-10-CM | POA: Diagnosis not present

## 2021-08-23 LAB — TSH: TSH: 1 u[IU]/mL (ref 0.450–4.500)

## 2021-08-23 LAB — T4, FREE: Free T4: 1.42 ng/dL (ref 0.82–1.77)

## 2021-08-29 ENCOUNTER — Encounter: Payer: Self-pay | Admitting: "Endocrinology

## 2021-08-29 ENCOUNTER — Ambulatory Visit: Payer: Medicare Other | Admitting: "Endocrinology

## 2021-08-29 VITALS — BP 122/74 | HR 84 | Ht 63.0 in | Wt 177.2 lb

## 2021-08-29 DIAGNOSIS — E119 Type 2 diabetes mellitus without complications: Secondary | ICD-10-CM

## 2021-08-29 DIAGNOSIS — E890 Postprocedural hypothyroidism: Secondary | ICD-10-CM | POA: Diagnosis not present

## 2021-08-29 DIAGNOSIS — E118 Type 2 diabetes mellitus with unspecified complications: Secondary | ICD-10-CM | POA: Insufficient documentation
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
## 2021-08-31 ENCOUNTER — Other Ambulatory Visit: Payer: Self-pay

## 2021-08-31 DIAGNOSIS — I10 Essential (primary) hypertension: Secondary | ICD-10-CM | POA: Diagnosis not present

## 2021-08-31 DIAGNOSIS — E1140 Type 2 diabetes mellitus with diabetic neuropathy, unspecified: Secondary | ICD-10-CM | POA: Diagnosis not present

## 2021-08-31 DIAGNOSIS — E785 Hyperlipidemia, unspecified: Secondary | ICD-10-CM | POA: Diagnosis not present

## 2021-08-31 DIAGNOSIS — E538 Deficiency of other specified B group vitamins: Secondary | ICD-10-CM | POA: Diagnosis not present

## 2021-08-31 DIAGNOSIS — M109 Gout, unspecified: Secondary | ICD-10-CM

## 2021-09-01 LAB — CBC
Hematocrit: 37.2 % (ref 34.0–46.6)
Hemoglobin: 12.3 g/dL (ref 11.1–15.9)
MCH: 28.8 pg (ref 26.6–33.0)
MCHC: 33.1 g/dL (ref 31.5–35.7)
MCV: 87 fL (ref 79–97)
Platelets: 311 10*3/uL (ref 150–450)
RBC: 4.27 x10E6/uL (ref 3.77–5.28)
RDW: 13.6 % (ref 11.7–15.4)
WBC: 4.4 10*3/uL (ref 3.4–10.8)

## 2021-09-01 LAB — CMP14+EGFR
ALT: 12 IU/L (ref 0–32)
AST: 14 IU/L (ref 0–40)
Albumin/Globulin Ratio: 1.7 (ref 1.2–2.2)
Albumin: 4.4 g/dL (ref 3.7–4.7)
Alkaline Phosphatase: 47 IU/L (ref 44–121)
BUN/Creatinine Ratio: 19 (ref 12–28)
BUN: 16 mg/dL (ref 8–27)
Bilirubin Total: 0.2 mg/dL (ref 0.0–1.2)
CO2: 24 mmol/L (ref 20–29)
Calcium: 9.9 mg/dL (ref 8.7–10.3)
Chloride: 97 mmol/L (ref 96–106)
Creatinine, Ser: 0.86 mg/dL (ref 0.57–1.00)
Globulin, Total: 2.6 g/dL (ref 1.5–4.5)
Glucose: 118 mg/dL — ABNORMAL HIGH (ref 70–99)
Potassium: 5 mmol/L (ref 3.5–5.2)
Sodium: 139 mmol/L (ref 134–144)
Total Protein: 7 g/dL (ref 6.0–8.5)
eGFR: 66 mL/min/{1.73_m2} (ref >59)

## 2021-09-01 LAB — LIPID PANEL
Chol/HDL Ratio: 2.8 ratio (ref 0.0–4.4)
Cholesterol, Total: 159 mg/dL (ref 100–199)
HDL: 57 mg/dL (ref >39)
LDL Chol Calc (NIH): 88 mg/dL (ref 0–99)
Triglycerides: 73 mg/dL (ref 0–149)
VLDL Cholesterol Cal: 14 mg/dL (ref 5–40)

## 2021-09-01 LAB — URIC ACID: Uric Acid: 6.4 mg/dL (ref 3.1–7.9)

## 2021-09-01 LAB — HEMOGLOBIN A1C
Est. average glucose Bld gHb Est-mCnc: 148 mg/dL
Hgb A1c MFr Bld: 6.8 % — ABNORMAL HIGH (ref 4.8–5.6)

## 2021-09-01 LAB — VITAMIN B12: Vitamin B-12: 725 pg/mL (ref 232–1245)

## 2021-09-04 ENCOUNTER — Ambulatory Visit (INDEPENDENT_AMBULATORY_CARE_PROVIDER_SITE_OTHER): Payer: Medicare Other | Admitting: Family Medicine

## 2021-09-04 ENCOUNTER — Encounter: Payer: Self-pay | Admitting: Family Medicine

## 2021-09-04 DIAGNOSIS — E669 Obesity, unspecified: Secondary | ICD-10-CM

## 2021-09-04 DIAGNOSIS — E1140 Type 2 diabetes mellitus with diabetic neuropathy, unspecified: Secondary | ICD-10-CM | POA: Diagnosis not present

## 2021-09-04 DIAGNOSIS — E785 Hyperlipidemia, unspecified: Secondary | ICD-10-CM | POA: Diagnosis not present

## 2021-09-04 DIAGNOSIS — I10 Essential (primary) hypertension: Secondary | ICD-10-CM

## 2021-09-04 DIAGNOSIS — E66811 Obesity, class 1: Secondary | ICD-10-CM

## 2021-09-04 NOTE — Assessment & Plan Note (Signed)
Controlled, no change in medication Hyperlipidemia:Low fat diet discussed and encouraged.   Lipid Panel  Lab Results  Component Value Date   CHOL 159 08/31/2021   HDL 57 08/31/2021   LDLCALC 88 08/31/2021   TRIG 73 08/31/2021   CHOLHDL 2.8 08/31/2021

## 2021-09-04 NOTE — Patient Instructions (Addendum)
Annual exam as before call me sooner.    Please get Tdap at your pharmacy if able this is due.  Please get COVID booster when available.  Excellent lab work and exam at this visit.no changes in medication  It is important that you exercise regularly at least 30 minutes 5 times a week. If you develop chest pain, have severe difficulty breathing, or feel very tired, stop exercising immediately and seek medical attention  Think about what you will eat, plan ahead. Choose " clean, green, fresh or frozen" over canned, processed or packaged foods which are more sugary, salty and fatty. 70 to 75% of food eaten should be vegetables and fruit. Three meals at set times with snacks allowed between meals, but they must be fruit or vegetables. Aim to eat over a 12 hour period , example 7 am to 7 pm, and STOP after  your last meal of the day. Drink water,generally about 64 ounces per day, no other drink is as healthy. Fruit juice is best enjoyed in a healthy way, by EATING the fruit. Thanks for choosing [facility], we consider it a privelige to serve you.

## 2021-09-04 NOTE — Assessment & Plan Note (Signed)
Lisa Mann is reminded of the importance of commitment to daily physical activity for 30 minutes or more, as able and the need to limit carbohydrate intake to 30 to 60 grams per meal to help with blood sugar control.   The need to take medication as prescribed, test blood sugar as directed, and to call between visits if there is a concern that blood sugar is uncontrolled is also discussed.   Lisa Mann is reminded of the importance of daily foot exam, annual eye examination, and good blood sugar, blood pressure and cholesterol control.     Latest Ref Rng & Units 08/31/2021    8:17 AM 03/07/2021    8:12 AM 10/03/2020    8:36 AM 06/07/2020   10:01 AM 04/19/2020   10:23 AM  Diabetic Labs  HbA1c 4.8 - 5.6 % 6.8  6.8  6.7     Micro/Creat Ratio 0 - 29 mg/g creat   5     Chol 100 - 199 mg/dL 159  171  185     HDL >39 mg/dL 57  60  58     Calc LDL 0 - 99 mg/dL 88  94  111     Triglycerides 0 - 149 mg/dL 73  94  86     Creatinine 0.57 - 1.00 mg/dL 0.86  0.79  0.74  0.63  0.60       09/04/2021    8:05 AM 08/29/2021    8:57 AM 03/06/2021    8:04 AM 02/28/2021    8:51 AM 11/24/2020    9:29 AM 10/11/2020    7:58 AM 08/22/2020    9:01 AM  BP/Weight  Systolic BP 671 245 809 983 382 505 397  Diastolic BP 69 74 66 64 71 74 77  Wt. (Lbs) 176 177.2 173.04 173 171.6 168 171  BMI 31.18 kg/m2 31.39 kg/m2 30.65 kg/m2 30.65 kg/m2 30.4 kg/m2 29.76 kg/m2 30.29 kg/m2      Latest Ref Rng & Units 09/04/2021    8:00 AM 11/29/2020   12:00 AM  Foot/eye exam completion dates  Eye Exam No Retinopathy  No Retinopathy      Foot Form Completion  Done      This result is from an external source.

## 2021-09-04 NOTE — Progress Notes (Signed)
Lisa Mann     MRN: [id number]      DOB: 1933-10-12   HPI [name] is here for follow up and re-evaluation of chronic medical conditions, medication management and review of any available recent lab and radiology data.  Preventive health is updated, specifically  Cancer screening and Immunization.   Questions or concerns regarding consultations or procedures which the PT has had in the interim are  addressed. The PT denies any adverse reactions to current medications since the last visit.  There are no new concerns.  There are no specific complaints   ROS Denies recent fever or chills. Denies sinus pressure, nasal congestion, ear pain or sore throat. Denies chest congestion, productive cough or wheezing. Denies chest pains, palpitations and leg swelling Denies abdominal pain, nausea, vomiting,diarrhea or constipation.   Denies dysuria, frequency, hesitancy or incontinence. C/o neck pain rated at 4Denies headaches, seizures, numbness, or tingling. Denies depression, anxiety or insomnia. Denies skin break down or rash.   PE  BP 127/69   Pulse 90   Resp 16   Ht '5\' 3"'$  (1.6 m)   Wt 176 lb (79.8 kg)   SpO2 96%   BMI 31.18 kg/m   Patient alert and oriented and in no cardiopulmonary distress.  HEENT: No facial asymmetry, EOMI,     Neck decreased ROM .  Chest: Clear to auscultation bilaterally.  CVS: S1, S2 no murmurs, no S3.Regular rate.  ABD: Soft non tender.   Ext: No edema  MS: Adequate ROM spine, shoulders, hips and knees.  Skin: Intact, no ulcerations or rash noted.  Psych: Good eye contact, normal affect. Memory intact not anxious or depressed appearing.  CNS: CN 2-12 intact, power,  normal throughout.no focal deficits noted.   Assessment & Plan  HTN, goal below 130/80 Controlled, no change in medication DASH diet and commitment to daily physical activity for a minimum of 30 minutes discussed and encouraged, as a part of hypertension management. The  importance of attaining a healthy weight is also discussed.     09/04/2021    8:05 AM 08/29/2021    8:57 AM 03/06/2021    8:04 AM 02/28/2021    8:51 AM 11/24/2020    9:29 AM 10/11/2020    7:58 AM 08/22/2020    9:01 AM  BP/Weight  Systolic BP 950 932 671 245 809 983 382  Diastolic BP 69 74 66 64 71 74 77  Wt. (Lbs) 176 177.2 173.04 173 171.6 168 171  BMI 31.18 kg/m2 31.39 kg/m2 30.65 kg/m2 30.65 kg/m2 30.4 kg/m2 29.76 kg/m2 30.29 kg/m2       Hyperlipidemia with target LDL less than 100 Controlled, no change in medication Hyperlipidemia:Low fat diet discussed and encouraged.   Lipid Panel  Lab Results  Component Value Date   CHOL 159 08/31/2021   HDL 57 08/31/2021   LDLCALC 88 08/31/2021   TRIG 73 08/31/2021   CHOLHDL 2.8 08/31/2021       Obesity (BMI 30.0-34.9)  Patient re-educated about  the importance of commitment to a  minimum of 150 minutes of exercise per week as able.  The importance of healthy food choices with portion control discussed, as well as eating regularly and within a 12 hour window most days. The need to choose "clean , green" food 50 to 75% of the time is discussed, as well as to make water the primary drink and set a goal of 64 ounces water daily.       09/04/2021  8:05 AM 08/29/2021    8:57 AM 03/06/2021    8:04 AM  Weight /BMI  Weight 176 lb 177 lb 3.2 oz 173 lb 0.6 oz  Height '5\' 3"'$  (1.6 m) '5\' 3"'$  (1.6 m) '5\' 3"'$  (1.6 m)  BMI 31.18 kg/m2 31.39 kg/m2 30.65 kg/m2      Diabetes mellitus with neuropathy Ms. Landing is reminded of the importance of commitment to daily physical activity for 30 minutes or more, as able and the need to limit carbohydrate intake to 30 to 60 grams per meal to help with blood sugar control.   The need to take medication as prescribed, test blood sugar as directed, and to call between visits if there is a concern that blood sugar is uncontrolled is also discussed.   [name] is reminded of the importance of daily foot  exam, annual eye examination, and good blood sugar, blood pressure and cholesterol control.     Latest Ref Rng & Units 08/31/2021    8:17 AM 03/07/2021    8:12 AM 10/03/2020    8:36 AM 06/07/2020   10:01 AM 04/19/2020   10:23 AM  Diabetic Labs  HbA1c 4.8 - 5.6 % 6.8  6.8  6.7     Micro/Creat Ratio 0 - 29 mg/g creat   5     Chol 100 - 199 mg/dL 159  171  185     HDL >39 mg/dL 57  60  58     Calc LDL 0 - 99 mg/dL 88  94  111     Triglycerides 0 - 149 mg/dL 73  94  86     Creatinine 0.57 - 1.00 mg/dL 0.86  0.79  0.74  0.63  0.60       09/04/2021    8:05 AM 08/29/2021    8:57 AM 03/06/2021    8:04 AM 02/28/2021    8:51 AM 11/24/2020    9:29 AM 10/11/2020    7:58 AM 08/22/2020    9:01 AM  BP/Weight  Systolic BP 938 182 993 716 967 893 810  Diastolic BP 69 74 66 64 71 74 77  Wt. (Lbs) 176 177.2 173.04 173 171.6 168 171  BMI 31.18 kg/m2 31.39 kg/m2 30.65 kg/m2 30.65 kg/m2 30.4 kg/m2 29.76 kg/m2 30.29 kg/m2      Latest Ref Rng & Units 09/04/2021    8:00 AM 11/29/2020   12:00 AM  Foot/eye exam completion dates  Eye Exam No Retinopathy  No Retinopathy      Foot Form Completion  Done      This result is from an external source.

## 2021-09-04 NOTE — Assessment & Plan Note (Signed)
Controlled, no change in medication DASH diet and commitment to daily physical activity for a minimum of 30 minutes discussed and encouraged, as a part of hypertension management. The importance of attaining a healthy weight is also discussed.     09/04/2021    8:05 AM 08/29/2021    8:57 AM 03/06/2021    8:04 AM 02/28/2021    8:51 AM 11/24/2020    9:29 AM 10/11/2020    7:58 AM 08/22/2020    9:01 AM  BP/Weight  Systolic BP 161 096 045 409 811 914 782  Diastolic BP 69 74 66 64 71 74 77  Wt. (Lbs) 176 177.2 173.04 173 171.6 168 171  BMI 31.18 kg/m2 31.39 kg/m2 30.65 kg/m2 30.65 kg/m2 30.4 kg/m2 29.76 kg/m2 30.29 kg/m2

## 2021-09-04 NOTE — Assessment & Plan Note (Signed)
  Patient re-educated about  the importance of commitment to a  minimum of 150 minutes of exercise per week as able.  The importance of healthy food choices with portion control discussed, as well as eating regularly and within a 12 hour window most days. The need to choose "clean , green" food 50 to 75% of the time is discussed, as well as to make water the primary drink and set a goal of 64 ounces water daily.       09/04/2021    8:05 AM 08/29/2021    8:57 AM 03/06/2021    8:04 AM  Weight /BMI  Weight 176 lb 177 lb 3.2 oz 173 lb 0.6 oz  Height '5\' 3"'$  (1.6 m) '5\' 3"'$  (1.6 m) '5\' 3"'$  (1.6 m)  BMI 31.18 kg/m2 31.39 kg/m2 30.65 kg/m2

## 2021-10-15 ENCOUNTER — Telehealth: Payer: Self-pay | Admitting: "Endocrinology

## 2021-10-15 DIAGNOSIS — E890 Postprocedural hypothyroidism: Secondary | ICD-10-CM

## 2021-10-15 NOTE — Telephone Encounter (Signed)
Pt requesting a refill on her Levothyroxine 75 MCG. Please send to [facility].

## 2021-10-16 ENCOUNTER — Ambulatory Visit (INDEPENDENT_AMBULATORY_CARE_PROVIDER_SITE_OTHER): Payer: Medicare Other | Admitting: Family Medicine

## 2021-10-16 ENCOUNTER — Encounter: Payer: Self-pay | Admitting: Family Medicine

## 2021-10-16 VITALS — BP 120/72 | HR 85 | Ht 63.0 in | Wt 177.0 lb

## 2021-10-16 DIAGNOSIS — R079 Chest pain, unspecified: Secondary | ICD-10-CM | POA: Diagnosis not present

## 2021-10-16 DIAGNOSIS — G5603 Carpal tunnel syndrome, bilateral upper limbs: Secondary | ICD-10-CM | POA: Diagnosis not present

## 2021-10-16 DIAGNOSIS — I10 Essential (primary) hypertension: Secondary | ICD-10-CM

## 2021-10-16 DIAGNOSIS — Z0001 Encounter for general adult medical examination with abnormal findings: Secondary | ICD-10-CM

## 2021-10-16 DIAGNOSIS — E538 Deficiency of other specified B group vitamins: Secondary | ICD-10-CM

## 2021-10-16 DIAGNOSIS — R7989 Other specified abnormal findings of blood chemistry: Secondary | ICD-10-CM

## 2021-10-16 DIAGNOSIS — E785 Hyperlipidemia, unspecified: Secondary | ICD-10-CM

## 2021-10-16 DIAGNOSIS — R0789 Other chest pain: Secondary | ICD-10-CM | POA: Diagnosis not present

## 2021-10-16 DIAGNOSIS — E1140 Type 2 diabetes mellitus with diabetic neuropathy, unspecified: Secondary | ICD-10-CM

## 2021-10-16 DIAGNOSIS — M109 Gout, unspecified: Secondary | ICD-10-CM

## 2021-10-16 DIAGNOSIS — E559 Vitamin D deficiency, unspecified: Secondary | ICD-10-CM

## 2021-10-16 MED ORDER — LEVOTHYROXINE SODIUM 75 MCG PO TABS: 75.0000 ug | ORAL_TABLET | Freq: Every day | ORAL | 0 refills | Status: DC

## 2021-10-16 MED ORDER — LEVOTHYROXINE SODIUM 75 MCG PO TABS: 75.0000 ug | ORAL_TABLET | Freq: Every day | ORAL | 3 refills | Status: DC

## 2021-10-16 MED ORDER — METFORMIN HCL 1000 MG PO TABS: 1000.0000 mg | ORAL_TABLET | Freq: Two times a day (BID) | ORAL | 3 refills | Status: DC

## 2021-10-16 MED ORDER — ALLOPURINOL 100 MG PO TABS: 100.0000 mg | ORAL_TABLET | Freq: Every day | ORAL | 3 refills | Status: DC

## 2021-10-16 NOTE — Telephone Encounter (Signed)
Rx refill sent.

## 2021-10-16 NOTE — Assessment & Plan Note (Signed)
npon specific chest pain, at increased risk for CVD EKG in office at visit, no ischemia, no lVH, nSR

## 2021-10-16 NOTE — Assessment & Plan Note (Signed)
Start bedtime gabapentin '100mg'$  , if persists or worsens will call for Ortho eval

## 2021-10-16 NOTE — Patient Instructions (Addendum)
F/U 3rd week in January, call if you need me sooner  Nurse please retrieve Sept TdAP and flu vaccine from [facility] and document  Please do get covid vaccine as soon as possible  New for pain in hands at night is gabapentin 100 mg every evening  Use hand braces for carpal tunnel syndrome when you are sleeping , this will relieve pain  EKG today due to new chest pain x 6 months, if abnormal you will be referred to cardiology  Microalb today  Fasting CBC, vit B, lipid, cmp and EGFR, tSH, hBA1C, uric acid and vit D 2nd week in January Careful not to fall. Best for the rest of this year and the new year!  Thanks for choosing [facility], we consider it a privelige to serve you.

## 2021-10-16 NOTE — Assessment & Plan Note (Signed)

## 2021-10-16 NOTE — Progress Notes (Signed)
    Lisa Mann     MRN: [id number]      DOB: 11-06-1933  HPI: Patient is in for annual physical exam. Intermittent chest tightness x approx 6 month, mainly on first awakening, no assocuiated light headedness, nausea or sweating. No aggravating or relieving factors Immunization is reviewed , and  updated if needed. Bilateral numbness in hands awaking pt   PE: BP 120/72 (BP Location: Right Arm, Patient Position: Sitting)   Pulse 85   Ht '5\' 3"'$  (1.6 m)   Wt 177 lb (80.3 kg)   SpO2 96%   BMI 31.35 kg/m   Pleasant  female, alert and oriented x 3, in no cardio-pulmonary distress. Afebrile. HEENT No facial trauma or asymetry. Sinuses non tender.  Extra occullar muscles intact.. External ears normal, . Neck: supple, no adenopathy,JVD or thyromegaly.No bruits.  Chest: Clear to ascultation bilaterally.No crackles or wheezes. Non tender to palpation   Cardiovascular system; Heart sounds normal,  S1 and  S2 ,no S3.  No murmur, or thrill. Apical beat not displaced EKG : NSR, no LVH, no ischemia  Abdomen: Soft, non tender   Musculoskeletal exam: Decreased  ROM of spine, hips , shoulders and knees. Deformity ,swelling or crepitus noted. No muscle wasting or atrophy.   Neurologic: Cranial nerves 2 to 12 intact. Power, tone ,sensation s normal throughout. Ndisturbance in gait. No tremor. Bilateral thenar wasting  Skin: Intact, no ulceration, erythema , scaling or rash noted. Pigmentation normal throughout  Psych; Normal mood and affect. Judgement and concentration normal   Assessment & Plan:  Encounter for Medicare annual examination with abnormal findings Annual exam as documented. Counseling done  re healthy lifestyle involving commitment to 150 minutes exercise per week, heart healthy diet, and attaining healthy weight.The importance of adequate sleep also discussed. Regular seat belt use and home safety, is also discussed. Changes in health habits are decided on  by the patient with goals and time frames  set for achieving them. Immunization and cancer screening needs are specifically addressed at this visit.   Annual visit for general adult medical examination with abnormal findings Annual exam as documented. Counseling done  re healthy lifestyle involving commitment to 150 minutes exercise per week, heart healthy diet, and attaining healthy weight.The importance of adequate sleep also discussed. Regular seat belt use and home safety, is also discussed. Changes in health habits are decided on by the patient with goals and time frames  set for achieving them. Immunization and cancer screening needs are specifically addressed at this visit.   Chest pain npon specific chest pain, at increased risk for CVD EKG in office at visit, no ischemia, no lVH, nSR  Carpal tunnel syndrome, bilateral Start bedtime gabapentin '100mg'$  , if persists or worsens will call for Ortho eval

## 2021-10-18 LAB — MICROALBUMIN / CREATININE URINE RATIO
Creatinine, Urine: 46 mg/dL
Microalb/Creat Ratio: <7 mg/g creat (ref 0–29)
Microalbumin, Urine: <3 ug/mL

## 2021-11-21 DIAGNOSIS — Z961 Presence of intraocular lens: Secondary | ICD-10-CM | POA: Diagnosis not present

## 2021-11-21 DIAGNOSIS — E119 Type 2 diabetes mellitus without complications: Secondary | ICD-10-CM | POA: Diagnosis not present

## 2021-11-21 DIAGNOSIS — H04123 Dry eye syndrome of bilateral lacrimal glands: Secondary | ICD-10-CM | POA: Diagnosis not present

## 2021-11-21 DIAGNOSIS — H2511 Age-related nuclear cataract, right eye: Secondary | ICD-10-CM | POA: Diagnosis not present

## 2021-11-22 DIAGNOSIS — I739 Peripheral vascular disease, unspecified: Secondary | ICD-10-CM | POA: Diagnosis not present

## 2021-11-22 DIAGNOSIS — L110 Acquired keratosis follicularis: Secondary | ICD-10-CM | POA: Diagnosis not present

## 2021-11-22 DIAGNOSIS — M79672 Pain in left foot: Secondary | ICD-10-CM | POA: Diagnosis not present

## 2021-11-22 DIAGNOSIS — E1151 Type 2 diabetes mellitus with diabetic peripheral angiopathy without gangrene: Secondary | ICD-10-CM | POA: Diagnosis not present

## 2021-11-22 DIAGNOSIS — M79671 Pain in right foot: Secondary | ICD-10-CM | POA: Diagnosis not present

## 2021-11-22 DIAGNOSIS — E1140 Type 2 diabetes mellitus with diabetic neuropathy, unspecified: Secondary | ICD-10-CM | POA: Diagnosis not present

## 2021-11-26 DIAGNOSIS — H2511 Age-related nuclear cataract, right eye: Secondary | ICD-10-CM | POA: Diagnosis not present

## 2021-12-04 DIAGNOSIS — H2511 Age-related nuclear cataract, right eye: Secondary | ICD-10-CM | POA: Diagnosis not present

## 2022-01-14 ENCOUNTER — Encounter: Payer: Self-pay | Admitting: Internal Medicine

## 2022-01-14 ENCOUNTER — Ambulatory Visit (INDEPENDENT_AMBULATORY_CARE_PROVIDER_SITE_OTHER): Payer: Medicare Other | Admitting: Internal Medicine

## 2022-01-14 VITALS — BP 129/76 | HR 82 | Temp 98.4°F | Resp 16 | Ht 63.0 in | Wt 176.0 lb

## 2022-01-14 DIAGNOSIS — R0981 Nasal congestion: Secondary | ICD-10-CM | POA: Insufficient documentation

## 2022-01-14 DIAGNOSIS — R059 Cough, unspecified: Secondary | ICD-10-CM | POA: Diagnosis not present

## 2022-01-14 MED ORDER — IPRATROPIUM BROMIDE 0.03 % NA SOLN: 2.0000 | Freq: Two times a day (BID) | NASAL | 0 refills | Status: DC | PRN

## 2022-01-14 NOTE — Assessment & Plan Note (Addendum)
Assessment/Plan: Acute uncomplicated illness - Use nasal rinses as needed - Use nasal spray every 12 hours as needed  - Test for respiratory viruses today. - Follow up if symptoms worsen or fail to improve

## 2022-01-14 NOTE — Patient Instructions (Signed)
Thank you for trusting me with your care. To recap, today we discussed the following:  Nasal Congestion - Use nasal rinses as needed - Use nasal spray every 12 hours as needed  - Test for respiratory viruses today. - Follow up if symptoms worsen or fail to improve

## 2022-01-14 NOTE — Progress Notes (Signed)
   HPI:[name] is a 89 y.o. female who presents for evaluation of cough. For the details of today's visit, please refer to the assessment and plan. Patient complains of cough.  Symptoms began 3 days ago.  The cough is productive, without wheezing, dyspnea or hemoptysis, productive of yellow sputum. She does not have chronic cough. No history of asthma , GERD, or seasonal allergies.    Physical Exam: Vitals:   01/14/22 1328  BP: 129/76  Pulse: 82  Resp: 16  Temp: 98.4 F (36.9 C)  TempSrc: Oral  SpO2: 97%  Weight: 176 lb (79.8 kg)  Height: '5\' 3"'$  (1.6 m)     Physical Exam Constitutional:      Appearance: Normal appearance. She is well-developed and well-groomed.  HENT:     Nose: Congestion present. No rhinorrhea.     Mouth/Throat:     Mouth: Mucous membranes are moist. No oral lesions.     Pharynx: Posterior oropharyngeal erythema present. No oropharyngeal exudate.  Eyes:     General: No scleral icterus.    Conjunctiva/sclera: Conjunctivae normal.  Cardiovascular:     Rate and Rhythm: Normal rate and regular rhythm.     Heart sounds: No murmur heard.    No friction rub. No gallop.  Pulmonary:     Effort: Pulmonary effort is normal.     Breath sounds: No wheezing, rhonchi or rales.  Musculoskeletal:     Right lower leg: No edema.     Left lower leg: No edema.  Skin:    General: Skin is warm and dry.      Assessment & Plan:   Nasal congestion - Use nasal rinses as needed - Use nasal spray every 12 hours as needed  - Test for respiratory viruses today. - Follow up if symptoms worsen or fail to improve    Lorene Dy, MD

## 2022-01-16 ENCOUNTER — Encounter: Payer: Self-pay | Admitting: Family Medicine

## 2022-01-16 ENCOUNTER — Telehealth: Payer: Self-pay | Admitting: Internal Medicine

## 2022-01-16 DIAGNOSIS — J40 Bronchitis, not specified as acute or chronic: Secondary | ICD-10-CM

## 2022-01-16 DIAGNOSIS — R051 Acute cough: Secondary | ICD-10-CM

## 2022-01-16 LAB — COVID-19, FLU A+B AND RSV
Influenza A, NAA: NOT DETECTED
Influenza B, NAA: NOT DETECTED
RSV, NAA: NOT DETECTED
SARS-CoV-2, NAA: NOT DETECTED

## 2022-01-16 MED ORDER — AMOXICILLIN 875 MG PO TABS: 875.0000 mg | ORAL_TABLET | Freq: Two times a day (BID) | ORAL | 0 refills | Status: DC

## 2022-01-16 NOTE — Telephone Encounter (Signed)
Appt  made, spoke with patient daughter Santiago Glad.

## 2022-01-16 NOTE — Telephone Encounter (Signed)
Called and reviewed negative Covid, Flue, and RSV. Patient continues to have congestion and coughing up mucus. She does not feel any better. I am sending Amoxicillin to patients pharmacy and she will follow up if she worsens or fails to improve.

## 2022-01-17 ENCOUNTER — Ambulatory Visit (HOSPITAL_COMMUNITY)
Admission: RE | Admit: 2022-01-17 | Discharge: 2022-01-17 | Disposition: A | Discharge status: Home / Self Care | Payer: Medicare Other | Source: Ambulatory Visit | Attending: Family Medicine | Admitting: Family Medicine

## 2022-01-17 ENCOUNTER — Ambulatory Visit: Payer: Medicare Other | Admitting: Family Medicine

## 2022-01-17 DIAGNOSIS — R051 Acute cough: Secondary | ICD-10-CM | POA: Diagnosis not present

## 2022-01-17 DIAGNOSIS — R059 Cough, unspecified: Secondary | ICD-10-CM | POA: Diagnosis not present

## 2022-01-17 NOTE — Telephone Encounter (Signed)
Patient's daughter karen aware and will have someone take her this morning for xray

## 2022-01-23 ENCOUNTER — Encounter: Payer: Self-pay | Admitting: Family Medicine

## 2022-01-23 ENCOUNTER — Ambulatory Visit: Payer: Medicare Other | Admitting: Family Medicine

## 2022-01-23 ENCOUNTER — Ambulatory Visit (INDEPENDENT_AMBULATORY_CARE_PROVIDER_SITE_OTHER): Payer: Medicare Other | Admitting: Family Medicine

## 2022-01-23 VITALS — BP 128/72 | HR 88 | Ht 63.0 in | Wt 177.1 lb

## 2022-01-23 DIAGNOSIS — J209 Acute bronchitis, unspecified: Secondary | ICD-10-CM | POA: Diagnosis not present

## 2022-01-23 DIAGNOSIS — E890 Postprocedural hypothyroidism: Secondary | ICD-10-CM | POA: Diagnosis not present

## 2022-01-23 DIAGNOSIS — E785 Hyperlipidemia, unspecified: Secondary | ICD-10-CM

## 2022-01-23 DIAGNOSIS — I10 Essential (primary) hypertension: Secondary | ICD-10-CM | POA: Diagnosis not present

## 2022-01-23 DIAGNOSIS — E119 Type 2 diabetes mellitus without complications: Secondary | ICD-10-CM | POA: Diagnosis not present

## 2022-01-23 DIAGNOSIS — R7989 Other specified abnormal findings of blood chemistry: Secondary | ICD-10-CM | POA: Diagnosis not present

## 2022-01-23 DIAGNOSIS — E538 Deficiency of other specified B group vitamins: Secondary | ICD-10-CM | POA: Diagnosis not present

## 2022-01-23 DIAGNOSIS — E1140 Type 2 diabetes mellitus with diabetic neuropathy, unspecified: Secondary | ICD-10-CM | POA: Diagnosis not present

## 2022-01-23 DIAGNOSIS — E559 Vitamin D deficiency, unspecified: Secondary | ICD-10-CM | POA: Diagnosis not present

## 2022-01-23 MED ORDER — BENZONATATE 100 MG PO CAPS: 100.0000 mg | ORAL_CAPSULE | Freq: Two times a day (BID) | ORAL | 0 refills | Status: DC | PRN

## 2022-01-23 MED ORDER — AZELASTINE HCL 0.1 % NA SOLN: 2.0000 | Freq: Two times a day (BID) | NASAL | 2 refills | Status: DC

## 2022-01-23 NOTE — Patient Instructions (Addendum)
Follow-up mid-to-late June, call if you need me sooner.  Tessalon Perles and Astelin nasal spray are prescribed, please take as directed.  Complete entire antibiotic course.  Please get labs already ordered today before you check out.  Thanks for choosing [facility], we consider it a privelige to serve you.

## 2022-01-25 LAB — HEMOGLOBIN A1C
Est. average glucose Bld gHb Est-mCnc: 151 mg/dL
Hgb A1c MFr Bld: 6.9 % — ABNORMAL HIGH (ref 4.8–5.6)

## 2022-01-25 LAB — LIPID PANEL
Chol/HDL Ratio: 3.3 ratio (ref 0.0–4.4)
Cholesterol, Total: 179 mg/dL (ref 100–199)
HDL: 54 mg/dL (ref >39)
LDL Chol Calc (NIH): 103 mg/dL — ABNORMAL HIGH (ref 0–99)
Triglycerides: 126 mg/dL (ref 0–149)
VLDL Cholesterol Cal: 22 mg/dL (ref 5–40)

## 2022-01-25 LAB — CMP14+EGFR
ALT: 21 IU/L (ref 0–32)
AST: 23 IU/L (ref 0–40)
Albumin/Globulin Ratio: 1.6 (ref 1.2–2.2)
Albumin: 4.6 g/dL (ref 3.7–4.7)
Alkaline Phosphatase: 55 IU/L (ref 44–121)
BUN/Creatinine Ratio: 20 (ref 12–28)
BUN: 15 mg/dL (ref 8–27)
Bilirubin Total: 0.2 mg/dL (ref 0.0–1.2)
CO2: 25 mmol/L (ref 20–29)
Calcium: 10.8 mg/dL — ABNORMAL HIGH (ref 8.7–10.3)
Chloride: 96 mmol/L (ref 96–106)
Creatinine, Ser: 0.76 mg/dL (ref 0.57–1.00)
Globulin, Total: 2.9 g/dL (ref 1.5–4.5)
Glucose: 101 mg/dL — ABNORMAL HIGH (ref 70–99)
Potassium: 5.3 mmol/L — ABNORMAL HIGH (ref 3.5–5.2)
Sodium: 137 mmol/L (ref 134–144)
Total Protein: 7.5 g/dL (ref 6.0–8.5)
eGFR: 76 mL/min/{1.73_m2} (ref >59)

## 2022-01-25 LAB — CBC
Hematocrit: 39.3 % (ref 34.0–46.6)
Hemoglobin: 12.9 g/dL (ref 11.1–15.9)
MCH: 28.7 pg (ref 26.6–33.0)
MCHC: 32.8 g/dL (ref 31.5–35.7)
MCV: 88 fL (ref 79–97)
Platelets: 367 10*3/uL (ref 150–450)
RBC: 4.49 x10E6/uL (ref 3.77–5.28)
RDW: 13.9 % (ref 11.7–15.4)
WBC: 6.5 10*3/uL (ref 3.4–10.8)

## 2022-01-25 LAB — URIC ACID: Uric Acid: 4.7 mg/dL (ref 3.1–7.9)

## 2022-01-25 LAB — TSH: TSH: 0.974 u[IU]/mL (ref 0.450–4.500)

## 2022-01-25 LAB — VITAMIN B12: Vitamin B-12: 712 pg/mL (ref 232–1245)

## 2022-01-25 LAB — VITAMIN D 25 HYDROXY (VIT D DEFICIENCY, FRACTURES): Vit D, 25-Hydroxy: 36.7 ng/mL (ref 30.0–100.0)

## 2022-01-28 ENCOUNTER — Encounter: Payer: Self-pay | Admitting: Family Medicine

## 2022-01-28 DIAGNOSIS — J209 Acute bronchitis, unspecified: Secondary | ICD-10-CM | POA: Insufficient documentation

## 2022-01-28 NOTE — Assessment & Plan Note (Signed)
Hyperlipidemia:Low fat diet discussed and encouraged.   Lipid Panel  Lab Results  Component Value Date   CHOL 179 01/23/2022   HDL 54 01/23/2022   LDLCALC 103 (H) 01/23/2022   TRIG 126 01/23/2022   CHOLHDL 3.3 01/23/2022     Needs to reduce fat in diet, no med change

## 2022-01-28 NOTE — Assessment & Plan Note (Signed)
Controlled, no change in medication DASH diet and commitment to daily physical activity for a minimum of 30 minutes discussed and encouraged, as a part of hypertension management. The importance of attaining a healthy weight is also discussed.     01/23/2022    2:24 PM 01/23/2022    1:47 PM 01/14/2022    1:28 PM 10/16/2021    8:12 AM 09/04/2021    8:05 AM 08/29/2021    8:57 AM 03/06/2021    8:04 AM  BP/Weight  Systolic BP 353 317 409 927 800 447 158  Diastolic BP 72 69 76 72 69 74 66  Wt. (Lbs)  177.08 176 177 176 177.2 173.04  BMI  31.37 kg/m2 31.18 kg/m2 31.35 kg/m2 31.18 kg/m2 31.39 kg/m2 30.65 kg/m2

## 2022-01-28 NOTE — Assessment & Plan Note (Signed)
Lisa Mann is reminded of the importance of commitment to daily physical activity for 30 minutes or more, as able and the need to limit carbohydrate intake to 30 to 60 grams per meal to help with blood sugar control.   The need to take medication as prescribed, test blood sugar as directed, and to call between visits if there is a concern that blood sugar is uncontrolled is also discussed.   Lisa Mann is reminded of the importance of daily foot exam, annual eye examination, and good blood sugar, blood pressure and cholesterol control.     Latest Ref Rng & Units 01/23/2022    2:57 PM 10/16/2021    9:51 AM 08/31/2021    8:17 AM 03/07/2021    8:12 AM 10/03/2020    8:36 AM  Diabetic Labs  HbA1c 4.8 - 5.6 % 6.9   6.8  6.8  6.7   Micro/Creat Ratio 0 - 29 mg/g creat  <7    5   Chol 100 - 199 mg/dL 179   159  171  185   HDL >39 mg/dL 54   57  60  58   Calc LDL 0 - 99 mg/dL 103   88  94  111   Triglycerides 0 - 149 mg/dL 126   73  94  86   Creatinine 0.57 - 1.00 mg/dL 0.76   0.86  0.79  0.74       01/23/2022    2:24 PM 01/23/2022    1:47 PM 01/14/2022    1:28 PM 10/16/2021    8:12 AM 09/04/2021    8:05 AM 08/29/2021    8:57 AM 03/06/2021    8:04 AM  BP/Weight  Systolic BP 469 629 528 413 244 010 272  Diastolic BP 72 69 76 72 69 74 66  Wt. (Lbs)  177.08 176 177 176 177.2 173.04  BMI  31.37 kg/m2 31.18 kg/m2 31.35 kg/m2 31.18 kg/m2 31.39 kg/m2 30.65 kg/m2      Latest Ref Rng & Units 09/04/2021    8:00 AM 11/29/2020   12:00 AM  Foot/eye exam completion dates  Eye Exam No Retinopathy  No Retinopathy      Foot Form Completion  Done      This result is from an external source.      Controlled, no change in medication

## 2022-01-28 NOTE — Assessment & Plan Note (Signed)
Controlled, no change in medication  

## 2022-01-28 NOTE — Assessment & Plan Note (Signed)
Improving , needs to complete entire antibiotic course

## 2022-01-28 NOTE — Progress Notes (Signed)
Lisa Mann     MRN: [id number]      DOB: 1933/05/02   HPI Lisa Mann is here for follow up and re-evaluation of chronic medical conditions, medication management and review of any available recent lab and radiology data.  Preventive health is updated, specifically  Cancer screening and Immunization.   States cough is improving but not yet back to normal, no fever or chills in past 4 days The PT denies any adverse reactions to current medications since the last visit.  Denies polyuria, polydipsia, blurred vision , or hypoglycemic episodes.    ROS Denies recent fever or chills. Denies sinus pressure, nasal congestion, ear pain or sore throat. Denies chest pains, palpitations and leg swelling Denies abdominal pain, nausea, vomiting,diarrhea or constipation.   Denies dysuria, frequency, hesitancy or incontinence. Denies uncontrolled  joint pain, swelling and limitation in mobility. Denies headaches, seizures, numbness, or tingling. Denies depression, anxiety or insomnia. Denies skin break down or rash.   PE  BP 128/72   Pulse 88   Ht '5\' 3"'$  (1.6 m)   Wt 177 lb 1.3 oz (80.3 kg)   SpO2 94%   BMI 31.37 kg/m   Patient alert and oriented and in no cardiopulmonary distress.  HEENT: No facial asymmetry, EOMI,     Neck supple .  Chest: adequate air entry, few crackles , no wheezes CVS: S1, S2 no murmurs, no S3.Regular rate.  ABD: Soft non tender.   Ext: No edema  MS: decreased  ROM spine, shoulders, hips and knees.  Skin: Intact, no ulcerations or rash noted.  Psych: Good eye contact, normal affect. Memory intact not anxious or depressed appearing.  CNS: CN 2-12 intact, power,  normal throughout.no focal deficits noted.   Assessment & Plan  Acute bronchitis Improving , needs to complete entire antibiotic course  HTN, goal below 130/80 Controlled, no change in medication DASH diet and commitment to daily physical activity for a minimum of 30 minutes discussed and  encouraged, as a part of hypertension management. The importance of attaining a healthy weight is also discussed.     01/23/2022    2:24 PM 01/23/2022    1:47 PM 01/14/2022    1:28 PM 10/16/2021    8:12 AM 09/04/2021    8:05 AM 08/29/2021    8:57 AM 03/06/2021    8:04 AM  BP/Weight  Systolic BP 825 053 976 734 193 790 240  Diastolic BP 72 69 76 72 69 74 66  Wt. (Lbs)  177.08 176 177 176 177.2 173.04  BMI  31.37 kg/m2 31.18 kg/m2 31.35 kg/m2 31.18 kg/m2 31.39 kg/m2 30.65 kg/m2       Type 2 diabetes mellitus without complication, without long-term current use of insulin (Isabel) Lisa Mann is reminded of the importance of commitment to daily physical activity for 30 minutes or more, as able and the need to limit carbohydrate intake to 30 to 60 grams per meal to help with blood sugar control.   The need to take medication as prescribed, test blood sugar as directed, and to call between visits if there is a concern that blood sugar is uncontrolled is also discussed.   Lisa Mann is reminded of the importance of daily foot exam, annual eye examination, and good blood sugar, blood pressure and cholesterol control.     Latest Ref Rng & Units 01/23/2022    2:57 PM 10/16/2021    9:51 AM 08/31/2021    8:17 AM 03/07/2021    8:12 AM  10/03/2020    8:36 AM  Diabetic Labs  HbA1c 4.8 - 5.6 % 6.9   6.8  6.8  6.7   Micro/Creat Ratio 0 - 29 mg/g creat  <7    5   Chol 100 - 199 mg/dL 179   159  171  185   HDL >39 mg/dL 54   57  60  58   Calc LDL 0 - 99 mg/dL 103   88  94  111   Triglycerides 0 - 149 mg/dL 126   73  94  86   Creatinine 0.57 - 1.00 mg/dL 0.76   0.86  0.79  0.74       01/23/2022    2:24 PM 01/23/2022    1:47 PM 01/14/2022    1:28 PM 10/16/2021    8:12 AM 09/04/2021    8:05 AM 08/29/2021    8:57 AM 03/06/2021    8:04 AM  BP/Weight  Systolic BP 088 110 315 945 859 292 446  Diastolic BP 72 69 76 72 69 74 66  Wt. (Lbs)  177.08 176 177 176 177.2 173.04  BMI  31.37 kg/m2 31.18 kg/m2 31.35  kg/m2 31.18 kg/m2 31.39 kg/m2 30.65 kg/m2      Latest Ref Rng & Units 09/04/2021    8:00 AM 11/29/2020   12:00 AM  Foot/eye exam completion dates  Eye Exam No Retinopathy  No Retinopathy      Foot Form Completion  Done      This result is from an external source.      Controlled, no change in medication   Hyperlipidemia with target LDL less than 100 Hyperlipidemia:Low fat diet discussed and encouraged.   Lipid Panel  Lab Results  Component Value Date   CHOL 179 01/23/2022   HDL 54 01/23/2022   LDLCALC 103 (H) 01/23/2022   TRIG 126 01/23/2022   CHOLHDL 3.3 01/23/2022     Needs to reduce fat in diet, no med change  Hypothyroidism following radioiodine therapy Controlled, no change in medication

## 2022-02-26 DIAGNOSIS — M79672 Pain in left foot: Secondary | ICD-10-CM | POA: Diagnosis not present

## 2022-02-26 DIAGNOSIS — E1151 Type 2 diabetes mellitus with diabetic peripheral angiopathy without gangrene: Secondary | ICD-10-CM | POA: Diagnosis not present

## 2022-02-26 DIAGNOSIS — I739 Peripheral vascular disease, unspecified: Secondary | ICD-10-CM | POA: Diagnosis not present

## 2022-02-26 DIAGNOSIS — E1140 Type 2 diabetes mellitus with diabetic neuropathy, unspecified: Secondary | ICD-10-CM | POA: Diagnosis not present

## 2022-02-26 DIAGNOSIS — L110 Acquired keratosis follicularis: Secondary | ICD-10-CM | POA: Diagnosis not present

## 2022-02-26 DIAGNOSIS — M79671 Pain in right foot: Secondary | ICD-10-CM | POA: Diagnosis not present

## 2022-03-07 ENCOUNTER — Encounter: Payer: Self-pay | Admitting: Radiology

## 2022-04-11 ENCOUNTER — Encounter: Payer: Self-pay | Admitting: Family Medicine

## 2022-04-11 ENCOUNTER — Ambulatory Visit (INDEPENDENT_AMBULATORY_CARE_PROVIDER_SITE_OTHER): Payer: Medicare Other | Admitting: Family Medicine

## 2022-04-11 VITALS — Ht 62.0 in | Wt 177.0 lb

## 2022-04-11 DIAGNOSIS — Z0000 Encounter for general adult medical examination without abnormal findings: Secondary | ICD-10-CM | POA: Diagnosis not present
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
## 2022-04-19 DIAGNOSIS — M542 Cervicalgia: Secondary | ICD-10-CM | POA: Diagnosis not present

## 2022-04-19 DIAGNOSIS — M4722 Other spondylosis with radiculopathy, cervical region: Secondary | ICD-10-CM | POA: Diagnosis not present

## 2022-05-07 DIAGNOSIS — E1151 Type 2 diabetes mellitus with diabetic peripheral angiopathy without gangrene: Secondary | ICD-10-CM | POA: Diagnosis not present

## 2022-05-07 DIAGNOSIS — I739 Peripheral vascular disease, unspecified: Secondary | ICD-10-CM | POA: Diagnosis not present

## 2022-05-07 DIAGNOSIS — M79672 Pain in left foot: Secondary | ICD-10-CM | POA: Diagnosis not present

## 2022-05-07 DIAGNOSIS — E1140 Type 2 diabetes mellitus with diabetic neuropathy, unspecified: Secondary | ICD-10-CM | POA: Diagnosis not present

## 2022-05-07 DIAGNOSIS — L110 Acquired keratosis follicularis: Secondary | ICD-10-CM | POA: Diagnosis not present

## 2022-05-07 DIAGNOSIS — M79671 Pain in right foot: Secondary | ICD-10-CM | POA: Diagnosis not present

## 2022-05-13 ENCOUNTER — Encounter (HOSPITAL_COMMUNITY): Payer: Self-pay | Admitting: *Deleted

## 2022-05-13 ENCOUNTER — Other Ambulatory Visit: Payer: Self-pay

## 2022-05-13 ENCOUNTER — Emergency Department (HOSPITAL_COMMUNITY): Payer: Medicare Other

## 2022-05-13 ENCOUNTER — Emergency Department (HOSPITAL_COMMUNITY)
Admission: EM | Admit: 2022-05-13 | Discharge: 2022-05-13 | Disposition: A | Discharge status: Home / Self Care | Payer: Medicare Other | Attending: Emergency Medicine | Admitting: Emergency Medicine

## 2022-05-13 ENCOUNTER — Encounter: Payer: Self-pay | Admitting: Family Medicine

## 2022-05-13 DIAGNOSIS — M542 Cervicalgia: Secondary | ICD-10-CM | POA: Diagnosis not present

## 2022-05-13 DIAGNOSIS — Z7982 Long term (current) use of aspirin: Secondary | ICD-10-CM | POA: Diagnosis not present

## 2022-05-13 DIAGNOSIS — T1490XA Injury, unspecified, initial encounter: Secondary | ICD-10-CM | POA: Diagnosis not present

## 2022-05-13 DIAGNOSIS — W010XXA Fall on same level from slipping, tripping and stumbling without subsequent striking against object, initial encounter: Secondary | ICD-10-CM | POA: Diagnosis not present

## 2022-05-13 DIAGNOSIS — M79605 Pain in left leg: Secondary | ICD-10-CM | POA: Insufficient documentation

## 2022-05-13 DIAGNOSIS — S0990XA Unspecified injury of head, initial encounter: Secondary | ICD-10-CM | POA: Diagnosis not present

## 2022-05-13 DIAGNOSIS — Z7984 Long term (current) use of oral hypoglycemic drugs: Secondary | ICD-10-CM | POA: Insufficient documentation

## 2022-05-13 DIAGNOSIS — M545 Low back pain, unspecified: Secondary | ICD-10-CM | POA: Diagnosis not present

## 2022-05-13 DIAGNOSIS — M8588 Other specified disorders of bone density and structure, other site: Secondary | ICD-10-CM | POA: Diagnosis not present

## 2022-05-13 DIAGNOSIS — M79652 Pain in left thigh: Secondary | ICD-10-CM | POA: Diagnosis not present

## 2022-05-13 MED ORDER — ACETAMINOPHEN 325 MG PO TABS
650.0000 mg | ORAL_TABLET | Freq: Once | ORAL | Status: AC
  Administered 2022-05-13: 650 mg via ORAL
  Filled 2022-05-13: qty 2

## 2022-05-13 NOTE — Discharge Instructions (Addendum)
You were seen today and evaluated for pain after a fall yesterday.  There are no signs of broken bones or dislocation on your x-ray.  You can take over-the-counter Tylenol as directed on the packaging as needed for pain.  Follow-up close with your primary care doctor.  Evident on your CT scan of your neck there were findings that could suggest that the hardware has, somewhat loose.  This is not the area of your pain so I am not worried about this emergently but you should follow-up with your spine specialist.

## 2022-05-13 NOTE — Telephone Encounter (Signed)
Patient aware.

## 2022-05-13 NOTE — ED Triage Notes (Signed)
Pt states she fell yesterday morning, tripped on carpet that was raised up.  Pain to left neck that radiates to left shoulder and left thigh.  Pt states she landed on left side. Pt states she hit her head on corner of a tote.  Pt denies any blood thinners, daily baby ASA.

## 2022-05-13 NOTE — ED Provider Notes (Signed)
[place] EMERGENCY DEPARTMENT AT [facility] Provider Note   CSN: [id number] Arrival date & time: 05/13/22  1128     History  Chief Complaint  Patient presents with   Cipriano Bunker SOPHIANA SCALISI is a 89 y.o. female.  Resents the ER complaining of neck pain mild headache left low back pain and left leg pain after fall yesterday.  She states she had her arms full and there was an uneven area on the carpet that she tripped over, fell down, she thinks she struck the right side of her forehead off of a plastic tote.  She states she is feeling okay last night but is very sore today with pain in the left side of her neck left low back and left upper leg.  She been able to bear weight.  No numbness tingling or weakness, she is not on blood thinners.  She has not taken anything for pain.   Fall       Home Medications Prior to Admission medications   Medication Sig Start Date End Date Taking? Authorizing Provider  allopurinol (ZYLOPRIM) 100 MG tablet Take 1 tablet (100 mg total) by mouth daily. 10/16/21  Yes Kerri Perches, MD  aspirin 81 MG EC tablet Take 1 tablet (81 mg total) by mouth daily. Swallow whole. 07/04/20  Yes Kerri Perches, MD  atorvastatin (LIPITOR) 10 MG tablet TAKE 1 TABLET(10 MG) BY MOUTH DAILY 05/17/21  Yes Kerri Perches, MD  azelastine (ASTELIN) 0.1 % nasal spray Place 2 sprays into both nostrils 2 (two) times daily. Use in each nostril as directed 01/23/22  Yes Kerri Perches, MD  Calcium Carbonate-Vit D-Min (CALTRATE 600+D PLUS MINERALS) 600-800 MG-UNIT CHEW Take one twice daily 10/16/21  Yes Kerri Perches, MD  Cholecalciferol (VITAMIN D3) 25 MCG (1000 UT) CAPS Take 1 capsule (1,000 Units total) by mouth daily. 10/16/21  Yes Kerri Perches, MD  cyanocobalamin (VITAMIN B12) 500 MCG tablet Take 1 tablet (500 mcg total) by mouth daily. 10/16/21  Yes Kerri Perches, MD  gabapentin (NEURONTIN) 100 MG capsule Take 1 capsule (100 mg  total) by mouth at bedtime. 10/16/21  Yes Kerri Perches, MD  ipratropium (ATROVENT) 0.03 % nasal spray Place 2 sprays into both nostrils every 12 (twelve) hours as needed for rhinitis. 01/14/22  Yes Gardenia Phlegm, MD  levothyroxine (SYNTHROID) 75 MCG tablet Take 1 tablet (75 mcg total) by mouth daily. 10/16/21  Yes Roma Kayser, MD  metFORMIN (GLUCOPHAGE) 1000 MG tablet Take 1 tablet (1,000 mg total) by mouth 2 (two) times daily with a meal. 10/16/21  Yes Kerri Perches, MD  potassium chloride (KLOR-CON) 10 MEQ tablet TAKE 1 TABLET(10 MEQ) BY MOUTH DAILY 08/07/21  Yes Kerri Perches, MD  ramipril (ALTACE) 10 MG capsule TAKE 1 CAPSULE(10 MG) BY MOUTH DAILY 07/18/21  Yes Kerri Perches, MD  triamterene-hydrochlorothiazide (MAXZIDE) 75-50 MG tablet TAKE 1 TABLET BY MOUTH DAILY 06/20/21  Yes Kerri Perches, MD  amoxicillin (AMOXIL) 875 MG tablet Take 1 tablet (875 mg total) by mouth 2 (two) times daily. 1 po BID Patient not taking: Reported on 05/13/2022 01/16/22   Gardenia Phlegm, MD  benzonatate (TESSALON) 100 MG capsule Take 1 capsule (100 mg total) by mouth 2 (two) times daily as needed for cough. Patient not taking: Reported on 05/13/2022 01/23/22   Kerri Perches, MD      Allergies    Piroxicam, Propoxyphene, Propoxyphene n-acetaminophen, and  Terfenadine    Review of Systems   Review of Systems  Physical Exam Updated Vital Signs BP 133/72   Pulse 82   Temp 98 F (36.7 C) (Oral)   Resp 20   Ht 5\' 3"  (1.6 m)   Wt 80.3 kg   SpO2 99%   BMI 31.35 kg/m  Physical Exam Vitals and nursing note reviewed.  Constitutional:      General: She is not in acute distress.    Appearance: She is well-developed.  HENT:     Head: Normocephalic and atraumatic.  Eyes:     Conjunctiva/sclera: Conjunctivae normal.  Cardiovascular:     Rate and Rhythm: Normal rate and regular rhythm.     Heart sounds: No murmur heard. Pulmonary:     Effort: Pulmonary effort is normal.  No respiratory distress.     Breath sounds: Normal breath sounds.  Abdominal:     Palpations: Abdomen is soft.     Tenderness: There is no abdominal tenderness.  Musculoskeletal:        General: No swelling.     Left shoulder: Normal. No deformity or bony tenderness. Normal range of motion.     Left upper arm: Normal.     Left elbow: Normal.     Left forearm: Normal.     Cervical back: Neck supple. Tenderness present. No bony tenderness. Muscular tenderness present. No spinous process tenderness.     Thoracic back: Normal.     Lumbar back: Tenderness present. No bony tenderness.     Comments: Left lumbar paraspinous tenderness, no swelling, no crepitus, no erythema or warmth.  Skin:    General: Skin is warm and dry.     Capillary Refill: Capillary refill takes less than 2 seconds.  Neurological:     Mental Status: She is alert.  Psychiatric:        Mood and Affect: Mood normal.     ED Results / Procedures / Treatments   Labs (all labs ordered are listed, but only abnormal results are displayed) Labs Reviewed - No data to display  EKG None  Radiology DG Lumbar Spine Complete  Result Date: 05/13/2022 CLINICAL DATA:  Pain after fall EXAM: LUMBAR SPINE - COMPLETE 5 VIEW COMPARISON:  None Available. FINDINGS: Osteopenia. Five lumbar-type vertebral bodies. Preserved vertebral body heights, disc height. Scattered endplate osteophytes. Some lower lumbar facet degenerative changes. No listhesis or spondylolysis. Degenerative changes of the sacroiliac joints. Surgical clips in the right upper quadrant. Recommend continue precautions until clinical clearance and if there is further concern of injury additional workup with CT as clinically directed for further sensitivity. IMPRESSION: Osteopenia.  Mild degenerative changes. Electronically Signed   By: Karen Kays M.D.   On: 05/13/2022 15:20   CT Head Wo Contrast  Result Date: 05/13/2022 CLINICAL DATA:  Trauma. EXAM: CT HEAD WITHOUT  CONTRAST TECHNIQUE: Contiguous axial images were obtained from the base of the skull through the vertex without intravenous contrast. RADIATION DOSE REDUCTION: This exam was performed according to the departmental dose-optimization program which includes automated exposure control, adjustment of the mA and/or kV according to patient size and/or use of iterative reconstruction technique. COMPARISON:  Head CT 11/03/2019 FINDINGS: Brain: No evidence of acute infarction, hemorrhage, hydrocephalus, extra-axial collection or mass lesion/mass effect. Vascular: Atherosclerotic calcifications are present within the cavernous internal carotid arteries. Skull: Normal. Negative for fracture or focal lesion. Sinuses/Orbits: No acute finding. Other: None. IMPRESSION: No acute intracranial abnormality. Electronically Signed   By: Mcneil Sober.D.  On: 05/13/2022 15:05    Procedures Procedures    Medications Ordered in ED Medications  acetaminophen (TYLENOL) tablet 650 mg (650 mg Oral Given 05/13/22 1523)    ED Course/ Medical Decision Making/ A&P                             Medical Decision Making This patient presents to the ED for concern of pain after fall yesterday in the left neck left lower back and left upper leg, this involves an extensive number of treatment options, and is a complaint that carries with it a high risk of complications and morbidity.  The differential diagnosis includes, sprain, strain, contusion, intracranial hemorrhage, other    Additional history obtained:  Additional history obtained from prior note PCP for diabetes noted in EMR    Imaging Studies ordered:  I ordered imaging studies including lumbar spine x-ray, left femur x-ray, CT head and CT C-spine I independently visualized and interpreted imaging which showed fracture or traumatic malalignment lumbar x-ray, no fracture or dislocation noted on the femur x-ray, CT head shows no acute intracranial findings, CT C-spine  shows no fracture or traumatic misalignment I agree with the radiologist interpretation  CT C-spine radiology reading notes that the hardware at level C1-C2 shows some lucency around this could be seen in the setting of hardware loosening.  Patient has no pain or tenderness in this area, advised of finding to follow-up with her spine doctor but do not feel this impacts her care today.     Problem List / ED Course / Critical interventions / Medication management  Patient had mechanical fall yesterday having soreness to the left trapezius area of her neck, no midline tenderness, also struck her head, CT head and C-spine showed no traumatic findings.  As above hardware C-spine could be loosening, advised on outpatient follow-up with her spine specialist.  She is able to bear weight and ambulate, normal strength and sensation bilateral upper and lower extremities.  Pulses intact and left foot.  She is feeling better after Tylenol advised on outpatient follow-up and strict return precautions.  I have reviewed the patients home medicines and have made adjustments as needed   Amount and/or Complexity of Data Reviewed Radiology: ordered.  Risk OTC drugs.           Final Clinical Impression(s) / ED Diagnoses Final diagnoses:  Injury of head, initial encounter  Neck pain    Rx / DC Orders ED Discharge Orders     None         Josem Kaufmann 05/13/22 1701    Bethann Berkshire, MD 05/15/22 1539

## 2022-05-13 NOTE — Telephone Encounter (Signed)
LVM letting patients daughter know of below

## 2022-05-15 ENCOUNTER — Encounter: Payer: Self-pay | Admitting: Family Medicine

## 2022-05-15 ENCOUNTER — Ambulatory Visit (INDEPENDENT_AMBULATORY_CARE_PROVIDER_SITE_OTHER): Payer: Medicare Other | Admitting: Family Medicine

## 2022-05-15 VITALS — BP 130/67 | HR 87 | Resp 16 | Ht 63.0 in | Wt 176.0 lb

## 2022-05-15 DIAGNOSIS — Z7984 Long term (current) use of oral hypoglycemic drugs: Secondary | ICD-10-CM

## 2022-05-15 DIAGNOSIS — M542 Cervicalgia: Secondary | ICD-10-CM

## 2022-05-15 DIAGNOSIS — E119 Type 2 diabetes mellitus without complications: Secondary | ICD-10-CM

## 2022-05-15 DIAGNOSIS — E1140 Type 2 diabetes mellitus with diabetic neuropathy, unspecified: Secondary | ICD-10-CM | POA: Diagnosis not present

## 2022-05-15 DIAGNOSIS — S0993XD Unspecified injury of face, subsequent encounter: Secondary | ICD-10-CM | POA: Diagnosis not present

## 2022-05-15 DIAGNOSIS — W19XXXD Unspecified fall, subsequent encounter: Secondary | ICD-10-CM | POA: Diagnosis not present

## 2022-05-15 DIAGNOSIS — Z09 Encounter for follow-up examination after completed treatment for conditions other than malignant neoplasm: Secondary | ICD-10-CM | POA: Diagnosis not present

## 2022-05-15 DIAGNOSIS — H5462 Unqualified visual loss, left eye, normal vision right eye: Secondary | ICD-10-CM | POA: Diagnosis not present

## 2022-05-15 DIAGNOSIS — E785 Hyperlipidemia, unspecified: Secondary | ICD-10-CM

## 2022-05-15 NOTE — Patient Instructions (Addendum)
F/U in June as before, call if you need me sooner  You are referred for eye evaluation since you report blurry vision  Monitor neck pain, if getting better no need to go back to surgeon, if not call for an appointment with Neurosurgery  Best to remove the carpet even if unable to replace it to eliminate tripping again  Fasting lipid, cmp and HBA1C 3 to 5 days before next visit  Thanks for choosing [facility], we consider it a privelige to serve you.

## 2022-05-16 DIAGNOSIS — S0993XA Unspecified injury of face, initial encounter: Secondary | ICD-10-CM | POA: Diagnosis not present

## 2022-05-16 LAB — HM DIABETES EYE EXAM

## 2022-05-17 ENCOUNTER — Telehealth: Payer: Self-pay

## 2022-05-17 NOTE — Telephone Encounter (Signed)
Transition Care Management Unsuccessful Follow-up Telephone Call  Date of discharge and from where:  05/13/2022 [facility]  Attempts:  1st Attempt  Reason for unsuccessful TCM follow-up call:  Left voice message  Tymere Depuy [facility]  [facility] Population Health Community Resource Care Guide   [email or web address]  ?? [phone]   Website: [email or web address]  [email or web address]

## 2022-05-21 ENCOUNTER — Encounter: Payer: Self-pay | Admitting: Family Medicine

## 2022-05-21 DIAGNOSIS — W19XXXA Unspecified fall, initial encounter: Secondary | ICD-10-CM | POA: Insufficient documentation

## 2022-05-21 DIAGNOSIS — M542 Cervicalgia: Secondary | ICD-10-CM | POA: Insufficient documentation

## 2022-05-21 DIAGNOSIS — S0993XD Unspecified injury of face, subsequent encounter: Secondary | ICD-10-CM | POA: Insufficient documentation

## 2022-05-21 DIAGNOSIS — H5462 Unqualified visual loss, left eye, normal vision right eye: Secondary | ICD-10-CM | POA: Insufficient documentation

## 2022-05-21 NOTE — Assessment & Plan Note (Signed)
Controlled, no change in medication Updated lab needed at/ before next visit.  

## 2022-05-21 NOTE — Assessment & Plan Note (Signed)
Increased pain with abn scan of neck following recent fall, will need re imaging and neurosurgery eval if persists or worsens, n o new neurologic deficits

## 2022-05-21 NOTE — Assessment & Plan Note (Signed)
Reporting gradually reduced pain and swelling since acute injury recommend cool compress to swollen bruised area of left cheek and tylenol for pain as needed

## 2022-05-21 NOTE — Assessment & Plan Note (Signed)
Patient in for follow up of recent Ed visit Discharge summary, and laboratory and radiology data are reviewed, and any questions or concerns  are discussed. Specific issues requiring follow up are specifically addressed.  

## 2022-05-21 NOTE — Assessment & Plan Note (Signed)
Bklunt trauma to left eye on 05/12/2022, reporting decreased vision following this, refer eye exam

## 2022-05-21 NOTE — Progress Notes (Signed)
   Lisa Mann     MRN: [id number]      DOB: 09-16-1933  Chief Complaint  Patient presents with   Fall    On 5/5 and fell on her left side and left side of face. Went to ER on 5/6 for evaluation . Having blurry vision out of left eye     HPI Lisa Mann is here for follow up AS ABOVE.  Still c/oneck pain and ;eft faciall pain with bruising Accident happened byy tripping on uneven carpet in her kitchen ROS Denies recent fever or chills. Denies sinus pressure, nasal congestion, ear pain or sore throat. Denies chest congestion, productive cough or wheezing. Denies chest pains, palpitations and leg swelling Denies abdominal pain, nausea, vomiting,diarrhea or constipation.   Denies dysuria, frequency, hesitancy or incontinence. Denies joint pain, swelling and limitation in mobility. Denies headaches, seizures, numbness, or tingling. Denies depression, anxiety or insomnia. Denies skin break down or rash.   PE  BP 130/67   Pulse 87   Resp 16   Ht 5\' 3"  (1.6 m)   Wt 176 lb (79.8 kg)   SpO2 95%   BMI 31.18 kg/m   Patient alert and oriented and in no cardiopulmonary distress.  HEENT: No facial asymmetry, EOMI,     Neck decreased ROM  Chest: Clear to auscultation bilaterally.  CVS: S1, S2 no murmurs, no S3.Regular rate.  ABD: Soft non tender.   Ext: No edema  MS: decreased  ROM spine, shoulders, and knees.  Skin: Intact, no ulcerations or rash noted.infraorbital bruising of left cheek  Psych: Good eye contact, normal affect. Memory intact not anxious or depressed appearing.  CNS: CN 2-12 intact, power,  normal throughout.no focal deficits noted.   Assessment & Plan  Decreased vision of left eye Bklunt trauma to left eye on 05/12/2022, reporting decreased vision following this, refer eye exam   Facial trauma, subsequent encounter Reporting gradually reduced pain and swelling since acute injury recommend cool compress to swollen bruised area of left cheek and  tylenol for pain as needed  Fall Accidentally tripped on uneven rug in her kitchen on 5/5, residual c//o increased neck pain and reduced left vision, slightly better but still noticeable, will monitor neck and is referred for eye exam  Neck pain Increased pain with abn scan of neck following recent fall, will need re imaging and neurosurgery eval if persists or worsens, n o new neurologic deficits  Type 2 diabetes mellitus without complication, without long-term current use of insulin (HCC) Controlled, no change in medication  Updated lab needed at/ before next visit.   Hyperlipidemia with target LDL less than 100 Hyperlipidemia:Low fat diet discussed and encouraged.   Lipid Panel  Lab Results  Component Value Date   CHOL 179 01/23/2022   HDL 54 01/23/2022   LDLCALC 103 (H) 01/23/2022   TRIG 126 01/23/2022   CHOLHDL 3.3 01/23/2022     Needs to lower fat intake Updated lab needed at/ before next visit.   Encounter for examination following treatment at hospital Patient in for follow up of recent Ed visit. Discharge summary, and laboratory and radiology data are reviewed, and any questions or concerns  are discussed. Specific issues requiring follow up are specifically addressed.

## 2022-05-21 NOTE — Assessment & Plan Note (Signed)
Hyperlipidemia:Low fat diet discussed and encouraged.   Lipid Panel  Lab Results  Component Value Date   CHOL 179 01/23/2022   HDL 54 01/23/2022   LDLCALC 103 (H) 01/23/2022   TRIG 126 01/23/2022   CHOLHDL 3.3 01/23/2022     Needs to lower fat intake Updated lab needed at/ before next visit.

## 2022-05-21 NOTE — Assessment & Plan Note (Signed)
Accidentally tripped on uneven rug in her kitchen on 5/5, residual c//o increased neck pain and reduced left vision, slightly better but still noticeable, will monitor neck and is referred for eye exam

## 2022-05-22 ENCOUNTER — Telehealth: Payer: Self-pay

## 2022-05-22 NOTE — Telephone Encounter (Signed)
Transition Care Management Unsuccessful Follow-up Telephone Call  Date of discharge and from where:  05/13/2022 [facility]  Attempts:  2nd Attempt  Reason for unsuccessful TCM follow-up call:  Left voice message  Keiran Sias [facility]  [facility] Population Health Community Resource Care Guide   [email or web address]  ?? [phone]   Website: [email or web address]  [email or web address]

## 2022-06-10 ENCOUNTER — Other Ambulatory Visit: Payer: Self-pay | Admitting: Family Medicine

## 2022-06-14 ENCOUNTER — Other Ambulatory Visit: Payer: Self-pay | Admitting: Family Medicine

## 2022-06-17 DIAGNOSIS — Z961 Presence of intraocular lens: Secondary | ICD-10-CM | POA: Diagnosis not present

## 2022-06-17 DIAGNOSIS — H04123 Dry eye syndrome of bilateral lacrimal glands: Secondary | ICD-10-CM | POA: Diagnosis not present

## 2022-06-18 DIAGNOSIS — E785 Hyperlipidemia, unspecified: Secondary | ICD-10-CM | POA: Diagnosis not present

## 2022-06-18 DIAGNOSIS — E1140 Type 2 diabetes mellitus with diabetic neuropathy, unspecified: Secondary | ICD-10-CM | POA: Diagnosis not present

## 2022-06-19 LAB — CMP14+EGFR
ALT: 18 IU/L (ref 0–32)
AST: 20 IU/L (ref 0–40)
Albumin/Globulin Ratio: 1.7
Albumin: 4.3 g/dL (ref 3.7–4.7)
Alkaline Phosphatase: 46 IU/L (ref 44–121)
BUN/Creatinine Ratio: 23 (ref 12–28)
BUN: 19 mg/dL (ref 8–27)
Bilirubin Total: 0.2 mg/dL (ref 0.0–1.2)
CO2: 25 mmol/L (ref 20–29)
Calcium: 10.3 mg/dL (ref 8.7–10.3)
Chloride: 98 mmol/L (ref 96–106)
Creatinine, Ser: 0.82 mg/dL (ref 0.57–1.00)
Globulin, Total: 2.5 g/dL (ref 1.5–4.5)
Glucose: 116 mg/dL — ABNORMAL HIGH (ref 70–99)
Potassium: 5.1 mmol/L (ref 3.5–5.2)
Sodium: 139 mmol/L (ref 134–144)
Total Protein: 6.8 g/dL (ref 6.0–8.5)
eGFR: 70 mL/min/{1.73_m2} (ref >59)

## 2022-06-19 LAB — HEMOGLOBIN A1C
Est. average glucose Bld gHb Est-mCnc: 151 mg/dL
Hgb A1c MFr Bld: 6.9 % — ABNORMAL HIGH (ref 4.8–5.6)

## 2022-06-19 LAB — LIPID PANEL
Chol/HDL Ratio: 2.5 ratio (ref 0.0–4.4)
Cholesterol, Total: 154 mg/dL (ref 100–199)
HDL: 62 mg/dL (ref >39)
LDL Chol Calc (NIH): 77 mg/dL (ref 0–99)
Triglycerides: 79 mg/dL (ref 0–149)
VLDL Cholesterol Cal: 15 mg/dL (ref 5–40)

## 2022-06-25 ENCOUNTER — Encounter: Payer: Self-pay | Admitting: Family Medicine

## 2022-06-25 ENCOUNTER — Ambulatory Visit (INDEPENDENT_AMBULATORY_CARE_PROVIDER_SITE_OTHER): Payer: Medicare Other | Admitting: Family Medicine

## 2022-06-25 VITALS — BP 116/67 | HR 90 | Ht 63.0 in | Wt 176.1 lb

## 2022-06-25 DIAGNOSIS — M25561 Pain in right knee: Secondary | ICD-10-CM

## 2022-06-25 DIAGNOSIS — E66811 Obesity, class 1: Secondary | ICD-10-CM

## 2022-06-25 DIAGNOSIS — I10 Essential (primary) hypertension: Secondary | ICD-10-CM

## 2022-06-25 DIAGNOSIS — E785 Hyperlipidemia, unspecified: Secondary | ICD-10-CM

## 2022-06-25 DIAGNOSIS — E890 Postprocedural hypothyroidism: Secondary | ICD-10-CM

## 2022-06-25 DIAGNOSIS — E669 Obesity, unspecified: Secondary | ICD-10-CM

## 2022-06-25 DIAGNOSIS — E1140 Type 2 diabetes mellitus with diabetic neuropathy, unspecified: Secondary | ICD-10-CM | POA: Diagnosis not present

## 2022-06-25 DIAGNOSIS — E119 Type 2 diabetes mellitus without complications: Secondary | ICD-10-CM

## 2022-06-25 MED ORDER — GABAPENTIN 100 MG PO CAPS: ORAL_CAPSULE | ORAL | 3 refills | Status: DC

## 2022-06-25 NOTE — Assessment & Plan Note (Signed)
Increased pain and instability  worse in past 1 week, no falls, responding to ibuprofen and tylenol

## 2022-06-25 NOTE — Progress Notes (Signed)
Lisa Mann     MRN: [id number]      DOB: 06-06-1933  Chief Complaint  Patient presents with   Follow-up    Discuss medication list    HPI Lisa Mann is here for follow up and re-evaluation of chronic medical conditions, medication management and review of any available recent lab and radiology data.  Preventive health is updated, specifically  Cancer screening and Immunization.   Questions or concerns regarding consultations or procedures which the PT has had in the interim are  addressed. The PT denies any adverse reactions to current medications since the last visit.  1 week h/o intermittent left knee pain and instability, much improved  currently C/o inability to lose weight , eats a lot Requests med review as not currently needing or using nasal spray , and uses gabapentin at most 3 times per week, generally only twice   ROS Denies recent fever or chills. Denies sinus pressure, nasal congestion, ear pain or sore throat. Denies chest congestion, productive cough or wheezing. Denies chest pains, palpitations and leg swelling Denies abdominal pain, nausea, vomiting,diarrhea or constipation.   Denies dysuria, frequency, hesitancy or incontinence. Denies headaches, seizures, numbness, or tingling. Denies depression, anxiety or insomnia. Denies skin break down or rash.   PE  BP 116/67 (BP Location: Right Arm, Patient Position: Sitting, Cuff Size: Large)   Pulse 90   Ht 5\' 3"  (1.6 m)   Wt 176 lb 1.9 oz (79.9 kg)   SpO2 96%   BMI 31.20 kg/m   Patient alert and oriented and in no cardiopulmonary distress.  HEENT: No facial asymmetry, EOMI,     Neck supple .  Chest: Clear to auscultation bilaterally.  CVS: S1, S2 no murmurs, no S3.Regular rate.  ABD: Soft non tender.   Ext: No edema  MS: Adequate though reduced  ROM spine, shoulders, hips and knees.  Skin: Intact, no ulcerations or rash noted.  Psych: Good eye contact, normal affect. Memory intact not anxious or  depressed appearing.  CNS: CN 2-12 intact, power,  normal throughout.no focal deficits noted.   Assessment & Plan  Right knee pain Increased pain and instability  worse in past 1 week, no falls, responding to ibuprofen and tylenol  Obesity (BMI 30.0-34.9)  Patient re-educated about  the importance of commitment to a  minimum of 150 minutes of exercise per week as able.  The importance of healthy food choices with portion control discussed, as well as eating regularly and within a 12 hour window most days. The need to choose "clean , green" food 50 to 75% of the time is discussed, as well as to make water the primary drink and set a goal of 64 ounces water daily.       06/25/2022    8:06 AM 05/15/2022    2:58 PM 05/13/2022   12:53 PM  Weight /BMI  Weight 176 lb 1.9 oz 176 lb 177 lb  Height 5\' 3"  (1.6 m) 5\' 3"  (1.6 m) 5\' 3"  (1.6 m)  BMI 31.2 kg/m2 31.18 kg/m2 31.35 kg/m2      Type 2 diabetes mellitus without complication, without long-term current use of insulin (HCC) Lisa Mann is reminded of the importance of commitment to daily physical activity for 30 minutes or more, as able and the need to limit carbohydrate intake to 30 to 60 grams per meal to help with blood sugar control.   The need to take medication as prescribed, test blood sugar as directed, and to  call between visits if there is a concern that blood sugar is uncontrolled is also discussed.   Lisa Mann is reminded of the importance of daily foot exam, annual eye examination, and good blood sugar, blood pressure and cholesterol control.     Latest Ref Rng & Units 06/18/2022    8:14 AM 01/23/2022    2:57 PM 10/16/2021    9:51 AM 08/31/2021    8:17 AM 03/07/2021    8:12 AM  Diabetic Labs  HbA1c 4.8 - 5.6 % 6.9  6.9   6.8  6.8   Micro/Creat Ratio 0 - 29 mg/g creat   <7     Chol 100 - 199 mg/dL 161  096   045  409   HDL >39 mg/dL 62  54   57  60   Calc LDL 0 - 99 mg/dL 77  811   88  94   Triglycerides 0 - 149 mg/dL 79   914   73  94   Creatinine 0.57 - 1.00 mg/dL 7.82  9.56   2.13  0.86       06/25/2022    8:06 AM 05/15/2022    2:58 PM 05/13/2022    4:00 PM 05/13/2022    2:30 PM 05/13/2022    2:15 PM 05/13/2022   12:54 PM 05/13/2022   12:53 PM  BP/Weight  Systolic BP 116 130 133 126 131 120   Diastolic BP 67 67 72 67 76 64   Wt. (Lbs) 176.12 176     177  BMI 31.2 kg/m2 31.18 kg/m2     31.35 kg/m2      Latest Ref Rng & Units 05/16/2022   11:56 AM 09/04/2021    8:00 AM  Foot/eye exam completion dates  Eye Exam No Retinopathy No Retinopathy       Foot Form Completion   Done     This result is from an external source.        HTN, goal below 130/80 Controlled, no change in medication DASH diet and commitment to daily physical activity for a minimum of 30 minutes discussed and encouraged, as a part of hypertension management. The importance of attaining a healthy weight is also discussed.     06/25/2022    8:06 AM 05/15/2022    2:58 PM 05/13/2022    4:00 PM 05/13/2022    2:30 PM 05/13/2022    2:15 PM 05/13/2022   12:54 PM 05/13/2022   12:53 PM  BP/Weight  Systolic BP 116 130 133 126 131 120   Diastolic BP 67 67 72 67 76 64   Wt. (Lbs) 176.12 176     177  BMI 31.2 kg/m2 31.18 kg/m2     31.35 kg/m2       Hyperlipidemia with target LDL less than 100 Hyperlipidemia:Low fat diet discussed and encouraged.   Lipid Panel  Lab Results  Component Value Date   CHOL 154 06/18/2022   HDL 62 06/18/2022   LDLCALC 77 06/18/2022   TRIG 79 06/18/2022   CHOLHDL 2.5 06/18/2022     Controlled, no change in medication   Hypothyroidism following radioiodine therapy Controlled and managed by Endo  Diabetes mellitus with neuropathy Requiring less gabapentin and script adjusted to reflect actual use Controlled, no change in medication Lisa Mann is reminded of the importance of commitment to daily physical activity for 30 minutes or more, as able and the need to limit carbohydrate intake to 30 to 60 grams per  meal to  help with blood sugar control.   The need to take medication as prescribed, test blood sugar as directed, and to call between visits if there is a concern that blood sugar is uncontrolled is also discussed.   Lisa Mann is reminded of the importance of daily foot exam, annual eye examination, and good blood sugar, blood pressure and cholesterol control.     Latest Ref Rng & Units 06/18/2022    8:14 AM 01/23/2022    2:57 PM 10/16/2021    9:51 AM 08/31/2021    8:17 AM 03/07/2021    8:12 AM  Diabetic Labs  HbA1c 4.8 - 5.6 % 6.9  6.9   6.8  6.8   Micro/Creat Ratio 0 - 29 mg/g creat   <7     Chol 100 - 199 mg/dL 161  096   045  409   HDL >39 mg/dL 62  54   57  60   Calc LDL 0 - 99 mg/dL 77  811   88  94   Triglycerides 0 - 149 mg/dL 79  914   73  94   Creatinine 0.57 - 1.00 mg/dL 7.82  9.56   2.13  0.86       06/25/2022    8:06 AM 05/15/2022    2:58 PM 05/13/2022    4:00 PM 05/13/2022    2:30 PM 05/13/2022    2:15 PM 05/13/2022   12:54 PM 05/13/2022   12:53 PM  BP/Weight  Systolic BP 116 130 133 126 131 120   Diastolic BP 67 67 72 67 76 64   Wt. (Lbs) 176.12 176     177  BMI 31.2 kg/m2 31.18 kg/m2     31.35 kg/m2      Latest Ref Rng & Units 05/16/2022   11:56 AM 09/04/2021    8:00 AM  Foot/eye exam completion dates  Eye Exam No Retinopathy No Retinopathy       Foot Form Completion   Done     This result is from an external source.

## 2022-06-25 NOTE — Patient Instructions (Addendum)
Annual exam 10/18/2022 or after, call if you need me sooner  Microalb 10/11 or after  Excellent labs and blood pressure , keep up the great work  Please increase exercise, and reduce the amount of white foods and sweets that you eat  Commit to water  Please get Covid vaccine ion the fall  Thanks for choosing [facility], we consider it a privelige to serve you.

## 2022-06-25 NOTE — Assessment & Plan Note (Signed)
Controlled, no change in medication DASH diet and commitment to daily physical activity for a minimum of 30 minutes discussed and encouraged, as a part of hypertension management. The importance of attaining a healthy weight is also discussed.     06/25/2022    8:06 AM 05/15/2022    2:58 PM 05/13/2022    4:00 PM 05/13/2022    2:30 PM 05/13/2022    2:15 PM 05/13/2022   12:54 PM 05/13/2022   12:53 PM  BP/Weight  Systolic BP 116 130 133 126 131 120   Diastolic BP 67 67 72 67 76 64   Wt. (Lbs) 176.12 176     177  BMI 31.2 kg/m2 31.18 kg/m2     31.35 kg/m2

## 2022-06-25 NOTE — Assessment & Plan Note (Signed)
Controlled and managed by Endo 

## 2022-06-25 NOTE — Assessment & Plan Note (Signed)
Hyperlipidemia:Low fat diet discussed and encouraged.   Lipid Panel  Lab Results  Component Value Date   CHOL 154 06/18/2022   HDL 62 06/18/2022   LDLCALC 77 06/18/2022   TRIG 79 06/18/2022   CHOLHDL 2.5 06/18/2022     Controlled, no change in medication

## 2022-06-25 NOTE — Assessment & Plan Note (Signed)
Lisa Mann is reminded of the importance of commitment to daily physical activity for 30 minutes or more, as able and the need to limit carbohydrate intake to 30 to 60 grams per meal to help with blood sugar control.   The need to take medication as prescribed, test blood sugar as directed, and to call between visits if there is a concern that blood sugar is uncontrolled is also discussed.   Lisa Mann is reminded of the importance of daily foot exam, annual eye examination, and good blood sugar, blood pressure and cholesterol control.     Latest Ref Rng & Units 06/18/2022    8:14 AM 01/23/2022    2:57 PM 10/16/2021    9:51 AM 08/31/2021    8:17 AM 03/07/2021    8:12 AM  Diabetic Labs  HbA1c 4.8 - 5.6 % 6.9  6.9   6.8  6.8   Micro/Creat Ratio 0 - 29 mg/g creat   <7     Chol 100 - 199 mg/dL 782  956   213  086   HDL >39 mg/dL 62  54   57  60   Calc LDL 0 - 99 mg/dL 77  578   88  94   Triglycerides 0 - 149 mg/dL 79  469   73  94   Creatinine 0.57 - 1.00 mg/dL 6.29  5.28   4.13  2.44       06/25/2022    8:06 AM 05/15/2022    2:58 PM 05/13/2022    4:00 PM 05/13/2022    2:30 PM 05/13/2022    2:15 PM 05/13/2022   12:54 PM 05/13/2022   12:53 PM  BP/Weight  Systolic BP 116 130 133 126 131 120   Diastolic BP 67 67 72 67 76 64   Wt. (Lbs) 176.12 176     177  BMI 31.2 kg/m2 31.18 kg/m2     31.35 kg/m2      Latest Ref Rng & Units 05/16/2022   11:56 AM 09/04/2021    8:00 AM  Foot/eye exam completion dates  Eye Exam No Retinopathy No Retinopathy       Foot Form Completion   Done     This result is from an external source.

## 2022-06-25 NOTE — Assessment & Plan Note (Signed)
  Patient re-educated about  the importance of commitment to a  minimum of 150 minutes of exercise per week as able.  The importance of healthy food choices with portion control discussed, as well as eating regularly and within a 12 hour window most days. The need to choose "clean , green" food 50 to 75% of the time is discussed, as well as to make water the primary drink and set a goal of 64 ounces water daily.       06/25/2022    8:06 AM 05/15/2022    2:58 PM 05/13/2022   12:53 PM  Weight /BMI  Weight 176 lb 1.9 oz 176 lb 177 lb  Height 5\' 3"  (1.6 m) 5\' 3"  (1.6 m) 5\' 3"  (1.6 m)  BMI 31.2 kg/m2 31.18 kg/m2 31.35 kg/m2

## 2022-06-25 NOTE — Assessment & Plan Note (Addendum)
Requiring less gabapentin and script adjusted to reflect actual use Controlled, no change in medication Lisa Mann is reminded of the importance of commitment to daily physical activity for 30 minutes or more, as able and the need to limit carbohydrate intake to 30 to 60 grams per meal to help with blood sugar control.   The need to take medication as prescribed, test blood sugar as directed, and to call between visits if there is a concern that blood sugar is uncontrolled is also discussed.   Lisa Mann is reminded of the importance of daily foot exam, annual eye examination, and good blood sugar, blood pressure and cholesterol control.     Latest Ref Rng & Units 06/18/2022    8:14 AM 01/23/2022    2:57 PM 10/16/2021    9:51 AM 08/31/2021    8:17 AM 03/07/2021    8:12 AM  Diabetic Labs  HbA1c 4.8 - 5.6 % 6.9  6.9   6.8  6.8   Micro/Creat Ratio 0 - 29 mg/g creat   <7     Chol 100 - 199 mg/dL 161  096   045  409   HDL >39 mg/dL 62  54   57  60   Calc LDL 0 - 99 mg/dL 77  811   88  94   Triglycerides 0 - 149 mg/dL 79  914   73  94   Creatinine 0.57 - 1.00 mg/dL 7.82  9.56   2.13  0.86       06/25/2022    8:06 AM 05/15/2022    2:58 PM 05/13/2022    4:00 PM 05/13/2022    2:30 PM 05/13/2022    2:15 PM 05/13/2022   12:54 PM 05/13/2022   12:53 PM  BP/Weight  Systolic BP 116 130 133 126 131 120   Diastolic BP 67 67 72 67 76 64   Wt. (Lbs) 176.12 176     177  BMI 31.2 kg/m2 31.18 kg/m2     31.35 kg/m2      Latest Ref Rng & Units 05/16/2022   11:56 AM 09/04/2021    8:00 AM  Foot/eye exam completion dates  Eye Exam No Retinopathy No Retinopathy       Foot Form Completion   Done     This result is from an external source.

## 2022-07-13 ENCOUNTER — Other Ambulatory Visit: Payer: Self-pay | Admitting: Family Medicine

## 2022-07-13 DIAGNOSIS — E785 Hyperlipidemia, unspecified: Secondary | ICD-10-CM

## 2022-07-13 DIAGNOSIS — Z23 Encounter for immunization: Secondary | ICD-10-CM

## 2022-07-13 DIAGNOSIS — I10 Essential (primary) hypertension: Secondary | ICD-10-CM

## 2022-07-13 DIAGNOSIS — E1169 Type 2 diabetes mellitus with other specified complication: Secondary | ICD-10-CM

## 2022-07-16 DIAGNOSIS — I739 Peripheral vascular disease, unspecified: Secondary | ICD-10-CM | POA: Diagnosis not present

## 2022-07-16 DIAGNOSIS — E1151 Type 2 diabetes mellitus with diabetic peripheral angiopathy without gangrene: Secondary | ICD-10-CM | POA: Diagnosis not present

## 2022-07-16 DIAGNOSIS — L110 Acquired keratosis follicularis: Secondary | ICD-10-CM | POA: Diagnosis not present

## 2022-07-16 DIAGNOSIS — M79672 Pain in left foot: Secondary | ICD-10-CM | POA: Diagnosis not present

## 2022-07-16 DIAGNOSIS — M79671 Pain in right foot: Secondary | ICD-10-CM | POA: Diagnosis not present

## 2022-07-16 DIAGNOSIS — E1140 Type 2 diabetes mellitus with diabetic neuropathy, unspecified: Secondary | ICD-10-CM | POA: Diagnosis not present

## 2022-07-30 ENCOUNTER — Other Ambulatory Visit: Payer: Self-pay | Admitting: Family Medicine

## 2022-10-10 DIAGNOSIS — E119 Type 2 diabetes mellitus without complications: Secondary | ICD-10-CM | POA: Diagnosis not present

## 2022-10-11 ENCOUNTER — Other Ambulatory Visit: Payer: Self-pay | Admitting: Family Medicine

## 2022-10-11 DIAGNOSIS — E890 Postprocedural hypothyroidism: Secondary | ICD-10-CM

## 2022-10-12 LAB — MICROALBUMIN / CREATININE URINE RATIO
Creatinine, Urine: 112.5 mg/dL
Microalb/Creat Ratio: 3 mg/g{creat} (ref 0–29)
Microalbumin, Urine: 3.5 ug/mL

## 2022-10-14 ENCOUNTER — Other Ambulatory Visit: Payer: Self-pay | Admitting: "Endocrinology

## 2022-10-14 DIAGNOSIS — E1151 Type 2 diabetes mellitus with diabetic peripheral angiopathy without gangrene: Secondary | ICD-10-CM | POA: Diagnosis not present

## 2022-10-14 DIAGNOSIS — E1140 Type 2 diabetes mellitus with diabetic neuropathy, unspecified: Secondary | ICD-10-CM | POA: Diagnosis not present

## 2022-10-14 DIAGNOSIS — I739 Peripheral vascular disease, unspecified: Secondary | ICD-10-CM | POA: Diagnosis not present

## 2022-10-14 DIAGNOSIS — M79672 Pain in left foot: Secondary | ICD-10-CM | POA: Diagnosis not present

## 2022-10-14 DIAGNOSIS — E890 Postprocedural hypothyroidism: Secondary | ICD-10-CM

## 2022-10-14 DIAGNOSIS — M79671 Pain in right foot: Secondary | ICD-10-CM | POA: Diagnosis not present

## 2022-10-14 DIAGNOSIS — L110 Acquired keratosis follicularis: Secondary | ICD-10-CM | POA: Diagnosis not present

## 2022-10-14 NOTE — Telephone Encounter (Signed)
Patient needs  labs and follow up before her next refill.

## 2022-10-15 DIAGNOSIS — M542 Cervicalgia: Secondary | ICD-10-CM | POA: Diagnosis not present

## 2022-10-23 ENCOUNTER — Other Ambulatory Visit: Payer: Self-pay

## 2022-10-23 ENCOUNTER — Encounter: Payer: Self-pay | Admitting: Family Medicine

## 2022-10-23 ENCOUNTER — Ambulatory Visit: Payer: Medicare Other | Admitting: Family Medicine

## 2022-10-23 VITALS — BP 126/67 | HR 87 | Ht 63.0 in | Wt 175.0 lb

## 2022-10-23 DIAGNOSIS — E890 Postprocedural hypothyroidism: Secondary | ICD-10-CM | POA: Diagnosis not present

## 2022-10-23 DIAGNOSIS — E119 Type 2 diabetes mellitus without complications: Secondary | ICD-10-CM | POA: Diagnosis not present

## 2022-10-23 DIAGNOSIS — Z0001 Encounter for general adult medical examination with abnormal findings: Secondary | ICD-10-CM | POA: Diagnosis not present

## 2022-10-23 DIAGNOSIS — Z23 Encounter for immunization: Secondary | ICD-10-CM | POA: Diagnosis not present

## 2022-10-23 DIAGNOSIS — M479 Spondylosis, unspecified: Secondary | ICD-10-CM

## 2022-10-23 DIAGNOSIS — E1140 Type 2 diabetes mellitus with diabetic neuropathy, unspecified: Secondary | ICD-10-CM | POA: Diagnosis not present

## 2022-10-23 DIAGNOSIS — I10 Essential (primary) hypertension: Secondary | ICD-10-CM | POA: Diagnosis not present

## 2022-10-23 DIAGNOSIS — E785 Hyperlipidemia, unspecified: Secondary | ICD-10-CM

## 2022-10-23 MED ORDER — GABAPENTIN 100 MG PO CAPS: 100.0000 mg | ORAL_CAPSULE | Freq: Every day | ORAL | 5 refills | Status: DC

## 2022-10-23 MED ORDER — UNABLE TO FIND: 0 refills | Status: AC

## 2022-10-23 NOTE — Progress Notes (Signed)
Lisa Mann     MRN: [id number]      DOB: August 21, 1933  Chief Complaint  Patient presents with   Annual Exam    HPI: Patient is in for annual physical exam. Chronic back pain is addressed also. Immunization is reviewed , and  updated    PE: BP 126/67 (BP Location: Right Arm, Patient Position: Sitting, Cuff Size: Normal)   Pulse 87   Ht 5\' 3"  (1.6 m)   Wt 175 lb 0.6 oz (79.4 kg)   SpO2 96%   BMI 31.01 kg/m   Pleasant  female, alert and oriented x 3, in no cardio-pulmonary distress. Afebrile. HEENT No facial trauma or asymetry. Sinuses non tender.  Extra occullar muscles intact.. External ears normal, .TM clear, minimal cerumen present, good light reflex Neck: supple, no adenopathy,JVD or thyromegaly.No bruits.  Chest: Clear to ascultation bilaterally.No crackles or wheezes. Non tender to palpation   Cardiovascular system; Heart sounds normal,  S1 and  S2 ,no S3.  No murmur, or thrill. .  Abdomen: Soft, non tender, no organomegaly or masses. No bruits. Bowel sounds normal. No guarding, tenderness or rebound.    Musculoskeletal exam: Decreased  ROM of spine, hips , shoulders and knees.  Neurologic: Cranial nerves 2 to 12 intact.Decreased hearing Power, tone , normal throughout.  disturbance in gait. No tremor.  Skin: Intact, no ulceration, erythema , scaling or rash noted. Pigmentation normal throughout  Psych; Normal mood and affect. Judgement and concentration normal   Assessment & Plan:  Encounter for Medicare annual examination with abnormal findings Annual exam as documented. Counseling done  re healthy lifestyle involving commitment to 150 minutes exercise per week, heart healthy diet, and attaining healthy weight.The importance of adequate sleep also discussed. Regular seat belt use and home safety, is also discussed. Changes in health habits are decided on by the patient with goals and time frames  set for achieving  them. Immunization and cancer screening needs are specifically addressed at this visit.   Immunization due After obtaining informed consent, the vaccine is  administered , with no adverse effect noted at the time of administration.   DEGENERATIVE JOINT DISEASE, SPINE Uncontrolled inc gabapentin to 100 mg every night

## 2022-10-23 NOTE — Assessment & Plan Note (Signed)

## 2022-10-23 NOTE — Assessment & Plan Note (Signed)
After obtaining informed consent, the vaccine is  administered , with no adverse effect noted at the time of administration.

## 2022-10-23 NOTE — Patient Instructions (Signed)
Follow-up mid-March, call if you need me sooner.  Flu vaccine today.  Foot exam does qualify you for diabetic shoes, nurse please give patient a prescription for same.  You do need to get your hearing evaluated and try to get hearing aids that you will use since you have significant hearing loss which is negatively acting the quality of your life.  Increase gabapentin to taking it 1 every night to improve your pain control.  It is also safe to take 1 Tylenol 3 25 mg or 500 mg every day for your chronic back pain.  Since the back brace  affords relief please use that regularly is also.  Labs today CBC TSH and free T4 CMP and EGFR HbA1c.  All the best for the season and 2025.  Thanks for choosing [facility], we consider it a privelige to serve you.

## 2022-10-23 NOTE — Assessment & Plan Note (Signed)
Uncontrolled inc gabapentin to 100 mg every night

## 2022-10-24 LAB — CMP14+EGFR
ALT: 21 [IU]/L (ref 0–32)
AST: 20 [IU]/L (ref 0–40)
Albumin: 4.6 g/dL (ref 3.7–4.7)
Alkaline Phosphatase: 51 [IU]/L (ref 44–121)
BUN/Creatinine Ratio: 20 (ref 12–28)
BUN: 17 mg/dL (ref 8–27)
Bilirubin Total: 0.2 mg/dL (ref 0.0–1.2)
CO2: 27 mmol/L (ref 20–29)
Calcium: 10.5 mg/dL — ABNORMAL HIGH (ref 8.7–10.3)
Chloride: 98 mmol/L (ref 96–106)
Creatinine, Ser: 0.83 mg/dL (ref 0.57–1.00)
Globulin, Total: 2.7 g/dL (ref 1.5–4.5)
Glucose: 87 mg/dL (ref 70–99)
Potassium: 4.5 mmol/L (ref 3.5–5.2)
Sodium: 142 mmol/L (ref 134–144)
Total Protein: 7.3 g/dL (ref 6.0–8.5)
eGFR: 68 mL/min/{1.73_m2} (ref >59)

## 2022-10-24 LAB — TSH: TSH: 1.12 u[IU]/mL (ref 0.450–4.500)

## 2022-10-24 LAB — CBC
Hematocrit: 39.7 % (ref 34.0–46.6)
Hemoglobin: 12.9 g/dL (ref 11.1–15.9)
MCH: 29.7 pg (ref 26.6–33.0)
MCHC: 32.5 g/dL (ref 31.5–35.7)
MCV: 91 fL (ref 79–97)
Platelets: 380 10*3/uL (ref 150–450)
RBC: 4.35 x10E6/uL (ref 3.77–5.28)
RDW: 14.1 % (ref 11.7–15.4)
WBC: 6.1 10*3/uL (ref 3.4–10.8)

## 2022-10-24 LAB — T4, FREE: Free T4: 1.58 ng/dL (ref 0.82–1.77)

## 2022-10-24 LAB — HEMOGLOBIN A1C
Est. average glucose Bld gHb Est-mCnc: 151 mg/dL
Hgb A1c MFr Bld: 6.9 % — ABNORMAL HIGH (ref 4.8–5.6)

## 2022-10-28 ENCOUNTER — Other Ambulatory Visit: Payer: Self-pay | Admitting: Family Medicine

## 2022-11-02 ENCOUNTER — Other Ambulatory Visit: Payer: Self-pay | Admitting: Family Medicine

## 2022-12-09 ENCOUNTER — Other Ambulatory Visit: Payer: Self-pay | Admitting: "Endocrinology

## 2022-12-09 DIAGNOSIS — E890 Postprocedural hypothyroidism: Secondary | ICD-10-CM

## 2022-12-10 NOTE — Telephone Encounter (Signed)
I refilled for 60 days. Patient needs a follow up with labs for further refills.

## 2022-12-16 DIAGNOSIS — Z961 Presence of intraocular lens: Secondary | ICD-10-CM | POA: Diagnosis not present

## 2022-12-16 DIAGNOSIS — H04123 Dry eye syndrome of bilateral lacrimal glands: Secondary | ICD-10-CM | POA: Diagnosis not present

## 2023-01-07 ENCOUNTER — Other Ambulatory Visit: Payer: Self-pay | Admitting: Family Medicine

## 2023-01-07 DIAGNOSIS — I10 Essential (primary) hypertension: Secondary | ICD-10-CM

## 2023-01-07 DIAGNOSIS — E1169 Type 2 diabetes mellitus with other specified complication: Secondary | ICD-10-CM

## 2023-01-07 DIAGNOSIS — Z23 Encounter for immunization: Secondary | ICD-10-CM

## 2023-01-07 DIAGNOSIS — E785 Hyperlipidemia, unspecified: Secondary | ICD-10-CM

## 2023-01-13 NOTE — Telephone Encounter (Signed)
 Copied from CRM [id number]. Topic: Clinical - Medical Advice >> Jan 13, 2023  9:32 AM Arlina R wrote: Reason for CRM: Patient was exposed to positive tested Covid, wanted to know if anything needs to be done to assure she okay. Anything, needs to be or can be done. Daughter wants to be contacted. Her number is [phone]. If not able to contact, can call home and patient will answer.

## 2023-02-04 DIAGNOSIS — E890 Postprocedural hypothyroidism: Secondary | ICD-10-CM | POA: Diagnosis not present

## 2023-02-05 DIAGNOSIS — I739 Peripheral vascular disease, unspecified: Secondary | ICD-10-CM | POA: Diagnosis not present

## 2023-02-05 DIAGNOSIS — L110 Acquired keratosis follicularis: Secondary | ICD-10-CM | POA: Diagnosis not present

## 2023-02-05 DIAGNOSIS — M79671 Pain in right foot: Secondary | ICD-10-CM | POA: Diagnosis not present

## 2023-02-05 DIAGNOSIS — E1151 Type 2 diabetes mellitus with diabetic peripheral angiopathy without gangrene: Secondary | ICD-10-CM | POA: Diagnosis not present

## 2023-02-05 DIAGNOSIS — E1140 Type 2 diabetes mellitus with diabetic neuropathy, unspecified: Secondary | ICD-10-CM | POA: Diagnosis not present

## 2023-02-05 DIAGNOSIS — M79672 Pain in left foot: Secondary | ICD-10-CM | POA: Diagnosis not present

## 2023-02-05 LAB — T4, FREE: Free T4: 1.58 ng/dL (ref 0.82–1.77)

## 2023-02-05 LAB — TSH: TSH: 1.32 u[IU]/mL (ref 0.450–4.500)

## 2023-02-06 ENCOUNTER — Other Ambulatory Visit: Payer: Self-pay | Admitting: "Endocrinology

## 2023-02-06 DIAGNOSIS — E890 Postprocedural hypothyroidism: Secondary | ICD-10-CM

## 2023-02-11 ENCOUNTER — Encounter: Payer: Self-pay | Admitting: "Endocrinology

## 2023-02-11 ENCOUNTER — Ambulatory Visit: Payer: Medicare Other | Admitting: "Endocrinology

## 2023-02-11 VITALS — BP 138/60 | HR 96 | Ht 63.0 in | Wt 174.2 lb

## 2023-02-11 DIAGNOSIS — E890 Postprocedural hypothyroidism: Secondary | ICD-10-CM

## 2023-02-11 MED ORDER — LEVOTHYROXINE SODIUM 75 MCG PO TABS: 75.0000 ug | ORAL_TABLET | Freq: Every day | ORAL | 3 refills | Status: DC
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
## 2023-03-10 ENCOUNTER — Ambulatory Visit
Admission: EM | Admit: 2023-03-10 | Discharge: 2023-03-10 | Disposition: A | Discharge status: Home / Self Care | Attending: Family Medicine | Admitting: Family Medicine

## 2023-03-10 DIAGNOSIS — J069 Acute upper respiratory infection, unspecified: Secondary | ICD-10-CM | POA: Diagnosis not present

## 2023-03-10 LAB — POC COVID19/FLU A&B COMBO
Covid Antigen, POC: NEGATIVE
Influenza A Antigen, POC: NEGATIVE
Influenza B Antigen, POC: NEGATIVE

## 2023-03-10 MED ORDER — BENZONATATE 200 MG PO CAPS: 200.0000 mg | ORAL_CAPSULE | Freq: Three times a day (TID) | ORAL | 0 refills | Status: DC | PRN

## 2023-03-10 MED ORDER — AZELASTINE HCL 0.1 % NA SOLN: 1.0000 | Freq: Two times a day (BID) | NASAL | 0 refills | Status: DC

## 2023-03-10 MED ORDER — GUAIFENESIN ER 600 MG PO TB12: 600.0000 mg | ORAL_TABLET | Freq: Two times a day (BID) | ORAL | 0 refills | Status: AC | PRN
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
## 2023-03-10 NOTE — ED Triage Notes (Signed)
 Sore throat, cough, body aches x 2 days. Taking tylenol.

## 2023-03-10 NOTE — Discharge Instructions (Signed)
 Your COVID and flu test was negative today.  I have sent over some medications to help with your symptoms and you may also take Coricidin HBP, Tylenol.  Follow-up for significantly worsening symptoms.

## 2023-03-14 DIAGNOSIS — E890 Postprocedural hypothyroidism: Secondary | ICD-10-CM | POA: Diagnosis not present

## 2023-03-15 LAB — TSH: TSH: 0.696 u[IU]/mL (ref 0.450–4.500)

## 2023-03-15 LAB — T4, FREE: Free T4: 1.48 ng/dL (ref 0.82–1.77)

## 2023-03-20 ENCOUNTER — Encounter: Payer: Self-pay | Admitting: Family Medicine

## 2023-03-20 ENCOUNTER — Ambulatory Visit (INDEPENDENT_AMBULATORY_CARE_PROVIDER_SITE_OTHER): Payer: Medicare Other | Admitting: Family Medicine

## 2023-03-20 VITALS — BP 124/69 | HR 78 | Resp 16 | Ht 63.0 in | Wt 175.0 lb

## 2023-03-20 DIAGNOSIS — E890 Postprocedural hypothyroidism: Secondary | ICD-10-CM

## 2023-03-20 DIAGNOSIS — R195 Other fecal abnormalities: Secondary | ICD-10-CM

## 2023-03-20 DIAGNOSIS — G5601 Carpal tunnel syndrome, right upper limb: Secondary | ICD-10-CM | POA: Diagnosis not present

## 2023-03-20 DIAGNOSIS — I10 Essential (primary) hypertension: Secondary | ICD-10-CM | POA: Diagnosis not present

## 2023-03-20 DIAGNOSIS — E785 Hyperlipidemia, unspecified: Secondary | ICD-10-CM

## 2023-03-20 DIAGNOSIS — R194 Change in bowel habit: Secondary | ICD-10-CM

## 2023-03-20 DIAGNOSIS — E119 Type 2 diabetes mellitus without complications: Secondary | ICD-10-CM

## 2023-03-20 DIAGNOSIS — J302 Other seasonal allergic rhinitis: Secondary | ICD-10-CM | POA: Diagnosis not present

## 2023-03-20 DIAGNOSIS — E118 Type 2 diabetes mellitus with unspecified complications: Secondary | ICD-10-CM

## 2023-03-20 MED ORDER — PREDNISONE 5 MG PO TABS: 5.0000 mg | ORAL_TABLET | Freq: Two times a day (BID) | ORAL | 0 refills | Status: AC

## 2023-03-20 MED ORDER — MONTELUKAST SODIUM 10 MG PO TABS: 10.0000 mg | ORAL_TABLET | Freq: Every day | ORAL | 3 refills | Status: DC

## 2023-03-20 NOTE — Patient Instructions (Addendum)
 R/U in 4 months, call if you need me sooner  Labs today before you leave   You  will be referred to your GI Doctor because of 4 month h/o change in stool   Start daily metamucil, and stool softners ( colace) both are OTC  Take laxative every 3 days if no bowel movement  Try to reduce anxiety  You will be referred to dr Romeo Apple re carpal tunnel pain  Singulair and prednisone are prescribed for cough  Thanks for choosing [facility], we consider it a privelige to serve you.

## 2023-03-20 NOTE — Assessment & Plan Note (Signed)
 Updated lab needed at/ before next visit. Diabetes associated with hypertension, hyperlipidemia, and arthritis  Lisa Mann is reminded of the importance of commitment to daily physical activity for 30 minutes or more, as able and the need to limit carbohydrate intake to 30 to 60 grams per meal to help with blood sugar control.   The need to take medication as prescribed, test blood sugar as directed, and to call between visits if there is a concern that blood sugar is uncontrolled is also discussed.   Lisa Mann is reminded of the importance of daily foot exam, annual eye examination, and good blood sugar, blood pressure and cholesterol control.     Latest Ref Rng & Units 10/23/2022    9:34 AM 10/10/2022    8:15 AM 06/18/2022    8:14 AM 01/23/2022    2:57 PM 10/16/2021    9:51 AM  Diabetic Labs  HbA1c 4.8 - 5.6 % 6.9   6.9  6.9    Micro/Creat Ratio 0 - 29 mg/g creat  3    <7   Chol 100 - 199 mg/dL   161  096    HDL >04 mg/dL   62  54    Calc LDL 0 - 99 mg/dL   77  540    Triglycerides 0 - 149 mg/dL   79  981    Creatinine 0.57 - 1.00 mg/dL 1.91   4.78  2.95        03/20/2023    8:09 AM 03/10/2023    6:43 PM 02/11/2023    9:17 AM 10/23/2022    8:06 AM 06/25/2022    8:06 AM 05/15/2022    2:58 PM 05/13/2022    4:00 PM  BP/Weight  Systolic BP 124 124 138 126 116 130 133  Diastolic BP 69 64 60 67 67 67 72  Wt. (Lbs) 175  174.2 175.04 176.12 176   BMI 31 kg/m2  30.86 kg/m2 31.01 kg/m2 31.2 kg/m2 31.18 kg/m2       Latest Ref Rng & Units 10/23/2022    8:00 AM 05/16/2022   11:56 AM  Foot/eye exam completion dates  Eye Exam No Retinopathy  No Retinopathy      Foot Form Completion  Done      This result is from an external source.

## 2023-03-20 NOTE — Assessment & Plan Note (Signed)
 Updated lab needed at/ before next visit.  Hyperlipidemia:Low fat diet discussed and encouraged.   Lipid Panel  Lab Results  Component Value Date   CHOL 154 06/18/2022   HDL 62 06/18/2022   LDLCALC 77 06/18/2022   TRIG 79 06/18/2022   CHOLHDL 2.5 06/18/2022

## 2023-03-21 ENCOUNTER — Encounter: Payer: Self-pay | Admitting: Family Medicine

## 2023-03-21 DIAGNOSIS — G5601 Carpal tunnel syndrome, right upper limb: Secondary | ICD-10-CM | POA: Insufficient documentation

## 2023-03-21 DIAGNOSIS — R194 Change in bowel habit: Secondary | ICD-10-CM | POA: Insufficient documentation

## 2023-03-21 DIAGNOSIS — K5900 Constipation, unspecified: Secondary | ICD-10-CM | POA: Insufficient documentation

## 2023-03-21 DIAGNOSIS — R195 Other fecal abnormalities: Secondary | ICD-10-CM | POA: Insufficient documentation

## 2023-03-21 LAB — CMP14+EGFR
ALT: 17 IU/L (ref 0–32)
AST: 24 IU/L (ref 0–40)
Albumin: 4.6 g/dL (ref 3.7–4.7)
Alkaline Phosphatase: 47 IU/L (ref 44–121)
BUN/Creatinine Ratio: 20 (ref 12–28)
BUN: 18 mg/dL (ref 8–27)
Bilirubin Total: 0.2 mg/dL (ref 0.0–1.2)
CO2: 25 mmol/L (ref 20–29)
Calcium: 10.7 mg/dL — ABNORMAL HIGH (ref 8.7–10.3)
Chloride: 100 mmol/L (ref 96–106)
Creatinine, Ser: 0.91 mg/dL (ref 0.57–1.00)
Globulin, Total: 2.6 g/dL (ref 1.5–4.5)
Glucose: 109 mg/dL — ABNORMAL HIGH (ref 70–99)
Potassium: 4.7 mmol/L (ref 3.5–5.2)
Sodium: 142 mmol/L (ref 134–144)
Total Protein: 7.2 g/dL (ref 6.0–8.5)
eGFR: 61 mL/min/{1.73_m2} (ref >59)

## 2023-03-21 LAB — LIPID PANEL W/O CHOL/HDL RATIO
Cholesterol, Total: 165 mg/dL (ref 100–199)
HDL: 59 mg/dL (ref >39)
LDL Chol Calc (NIH): 84 mg/dL (ref 0–99)
Triglycerides: 122 mg/dL (ref 0–149)
VLDL Cholesterol Cal: 22 mg/dL (ref 5–40)

## 2023-03-21 LAB — HEMOGLOBIN A1C
Est. average glucose Bld gHb Est-mCnc: 148 mg/dL
Hgb A1c MFr Bld: 6.8 % — ABNORMAL HIGH (ref 4.8–5.6)

## 2023-03-21 NOTE — Assessment & Plan Note (Signed)
 Uncontrolled , commit to daily Astelin and Singulair, short course of prednisone is also prescribed

## 2023-03-21 NOTE — Assessment & Plan Note (Signed)
 Controlled, no change in medication DASH diet and commitment to daily physical activity for a minimum of 30 minutes discussed and encouraged, as a part of hypertension management. The importance of attaining a healthy weight is also discussed.     03/20/2023    8:09 AM 03/10/2023    6:43 PM 02/11/2023    9:17 AM 10/23/2022    8:06 AM 06/25/2022    8:06 AM 05/15/2022    2:58 PM 05/13/2022    4:00 PM  BP/Weight  Systolic BP 124 124 138 126 116 130 133  Diastolic BP 69 64 60 67 67 67 72  Wt. (Lbs) 175  174.2 175.04 176.12 176   BMI 31 kg/m2  30.86 kg/m2 31.01 kg/m2 31.2 kg/m2 31.18 kg/m2

## 2023-03-21 NOTE — Assessment & Plan Note (Signed)
Managed by endo, controlled 

## 2023-03-21 NOTE — Assessment & Plan Note (Addendum)
 4 month history, reports stringy stool and balls in past 4 months , which is new, refer GI, I explained pros and cons of evaluation as in a colonoscopy and advised she have a family member accompany her to the consultation

## 2023-03-21 NOTE — Progress Notes (Signed)
 Lisa Mann     MRN: [id number]      DOB: 07/21/1933  Chief Complaint  Patient presents with   Cough    Had a cough that started around 3/1 and she went to UC on 3/3. She was given some tessalon and advised mucinex. She has been using but still coughing up whitish clear mucus   Hand Pain    Hands bother her at night. Doesn't sleep well because of it. Pain and itching on both hands    HPI Lisa Mann is here for follow up and re-evaluation of chronic medical conditions, medication management and review of any available recent lab and radiology data.  Preventive health is updated, specifically  Cancer screening and Immunization.   Questions or concerns regarding consultations or procedures which the PT has had in the interim are  addressed. The PT denies any adverse reactions to current medications since the last visit.  Concerns as above 4 month h/o change in stool caliber and frequency, strings/ balls and new constipation Denies polyuria, polydipsia, blurred vision , or hypoglycemic episodes.    ROS Denies recent fever or chills. C/o sinus pressure, nasal congestion, denies ear pain or sore throat. C/o  chest congestion, productive cough clear sputum. Denies chest pains, palpitations and leg swelling C/o abdominal distension and change in stool x 4 months   Denies dysuria, frequency, hesitancy or incontinence. . Denies depression, c/o increased anxiety  Denies skin break down or rash.   PE  BP 124/69   Pulse 78   Resp 16   Ht 5\' 3"  (1.6 m)   Wt 175 lb (79.4 kg)   SpO2 96%   BMI 31.00 kg/m   Patient alert and oriented and in no cardiopulmonary distress.  HEENT: No facial asymmetry, EOMI,     Neck supple .  Chest: Clear to auscultation bilaterally.  CVS: S1, S2 no murmurs, no S3.Regular rate.  ABD: Soft non tender.   Ext: No edema  MS: Adequate ROM spine, shoulders, hips and knees.  Skin: Intact, no ulcerations or rash noted.  Psych: Good eye contact,  normal affect. Memory intact not anxious or depressed appearing.  CNS: CN 2-12 intact, power,  normal throughout.no focal deficits noted.   Assessment & Plan  Controlled diabetes mellitus type 2 with complications (HCC) Updated lab needed at/ before next visit. Diabetes associated with hypertension, hyperlipidemia, and arthritis  Lisa Mann is reminded of the importance of commitment to daily physical activity for 30 minutes or more, as able and the need to limit carbohydrate intake to 30 to 60 grams per meal to help with blood sugar control.   The need to take medication as prescribed, test blood sugar as directed, and to call between visits if there is a concern that blood sugar is uncontrolled is also discussed.   Lisa Mann is reminded of the importance of daily foot exam, annual eye examination, and good blood sugar, blood pressure and cholesterol control.     Latest Ref Rng & Units 10/23/2022    9:34 AM 10/10/2022    8:15 AM 06/18/2022    8:14 AM 01/23/2022    2:57 PM 10/16/2021    9:51 AM  Diabetic Labs  HbA1c 4.8 - 5.6 % 6.9   6.9  6.9    Micro/Creat Ratio 0 - 29 mg/g creat  3    <7   Chol 100 - 199 mg/dL   086  578    HDL >46 mg/dL   62  54    Calc LDL 0 - 99 mg/dL   77  161    Triglycerides 0 - 149 mg/dL   79  096    Creatinine 0.57 - 1.00 mg/dL 0.45   4.09  8.11        03/20/2023    8:09 AM 03/10/2023    6:43 PM 02/11/2023    9:17 AM 10/23/2022    8:06 AM 06/25/2022    8:06 AM 05/15/2022    2:58 PM 05/13/2022    4:00 PM  BP/Weight  Systolic BP 124 124 138 126 116 130 133  Diastolic BP 69 64 60 67 67 67 72  Wt. (Lbs) 175  174.2 175.04 176.12 176   BMI 31 kg/m2  30.86 kg/m2 31.01 kg/m2 31.2 kg/m2 31.18 kg/m2       Latest Ref Rng & Units 10/23/2022    8:00 AM 05/16/2022   11:56 AM  Foot/eye exam completion dates  Eye Exam No Retinopathy  No Retinopathy      Foot Form Completion  Done      This result is from an external source.         Hyperlipidemia with  target LDL less than 100 Updated lab needed at/ before next visit.  Hyperlipidemia:Low fat diet discussed and encouraged.   Lipid Panel  Lab Results  Component Value Date   CHOL 154 06/18/2022   HDL 62 06/18/2022   LDLCALC 77 06/18/2022   TRIG 79 06/18/2022   CHOLHDL 2.5 06/18/2022       Carpal tunnel syndrome on right 4 month h/o increased pain and  numbness disturbing sleep refer Ortho  HTN, goal below 130/80 Controlled, no change in medication DASH diet and commitment to daily physical activity for a minimum of 30 minutes discussed and encouraged, as a part of hypertension management. The importance of attaining a healthy weight is also discussed.     03/20/2023    8:09 AM 03/10/2023    6:43 PM 02/11/2023    9:17 AM 10/23/2022    8:06 AM 06/25/2022    8:06 AM 05/15/2022    2:58 PM 05/13/2022    4:00 PM  BP/Weight  Systolic BP 124 124 138 126 116 130 133  Diastolic BP 69 64 60 67 67 67 72  Wt. (Lbs) 175  174.2 175.04 176.12 176   BMI 31 kg/m2  30.86 kg/m2 31.01 kg/m2 31.2 kg/m2 31.18 kg/m2        Hypothyroidism following radioiodine therapy Managed by endo, controlled  Allergic rhinitis Uncontrolled , commit to daily Astelin and Singulair, short course of prednisone is also prescribed  Change in stool caliber 4 month history, reports stringy stool and balls in past 4 months , which is new, refer GI, I explained pros and cons of evaluation as in a colonoscopy and advised she have a family member accompany her to the consultation  Change in stool habits 4  month h/o constipation, which is new, will commit to daily metamucil and stool softener, laxative every 3 days if needed

## 2023-03-21 NOTE — Assessment & Plan Note (Signed)
 4  month h/o constipation, which is new, will commit to daily metamucil and stool softener, laxative every 3 days if needed

## 2023-03-21 NOTE — Assessment & Plan Note (Signed)
 4 month h/o increased pain and  numbness disturbing sleep refer Ortho

## 2023-03-27 ENCOUNTER — Ambulatory Visit
Admission: EM | Admit: 2023-03-27 | Discharge: 2023-03-27 | Disposition: A | Discharge status: Home / Self Care | Attending: Nurse Practitioner | Admitting: Nurse Practitioner

## 2023-03-27 ENCOUNTER — Ambulatory Visit (HOSPITAL_COMMUNITY)
Admission: RE | Admit: 2023-03-27 | Discharge: 2023-03-27 | Disposition: A | Discharge status: Home / Self Care | Source: Ambulatory Visit | Attending: Nurse Practitioner | Admitting: Nurse Practitioner

## 2023-03-27 DIAGNOSIS — M545 Low back pain, unspecified: Secondary | ICD-10-CM | POA: Diagnosis not present

## 2023-03-27 DIAGNOSIS — M5459 Other low back pain: Secondary | ICD-10-CM | POA: Diagnosis not present

## 2023-03-27 DIAGNOSIS — Z9181 History of falling: Secondary | ICD-10-CM | POA: Diagnosis not present

## 2023-03-27 DIAGNOSIS — M5136 Other intervertebral disc degeneration, lumbar region with discogenic back pain only: Secondary | ICD-10-CM | POA: Diagnosis not present

## 2023-03-27 DIAGNOSIS — W19XXXA Unspecified fall, initial encounter: Secondary | ICD-10-CM | POA: Diagnosis not present

## 2023-03-27 DIAGNOSIS — Z8739 Personal history of other diseases of the musculoskeletal system and connective tissue: Secondary | ICD-10-CM

## 2023-03-27 NOTE — ED Triage Notes (Addendum)
 Pt reports her hand slip while trying to cut the light off and she dropped a cup she was carrying and when she went to pick it  and then fell backwards. States she has tailbone pain due to landing on it. Denies hitting her head or recent surgery area on back of neck.

## 2023-03-27 NOTE — Discharge Instructions (Addendum)
 Go to [facility] for an x-ray of your back.  Go to the main entrance to get to the radiology department. Continue over-the-counter Tylenol as needed for pain or discomfort. Recommend the use of ice or heat.  Apply ice for pain or swelling, heat for spasm or stiffness.  Apply for 20 minutes, remove for 1 hour, repeat as needed. Try to remain as active as possible. Go to the emergency department immediately if you experience sudden loss of bowel or bladder function, numbness or tingling in your legs or feet, or become unable to walk. Follow-up with your PCP within the next 7 to 10 days for reevaluation. Follow-up as needed.
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
## 2023-03-31 ENCOUNTER — Other Ambulatory Visit: Payer: Self-pay | Admitting: Family Medicine

## 2023-03-31 DIAGNOSIS — R2681 Unsteadiness on feet: Secondary | ICD-10-CM

## 2023-03-31 DIAGNOSIS — R296 Repeated falls: Secondary | ICD-10-CM

## 2023-03-31 DIAGNOSIS — M1990 Unspecified osteoarthritis, unspecified site: Secondary | ICD-10-CM

## 2023-03-31 MED ORDER — TRAMADOL HCL 50 MG PO TABS: ORAL_TABLET | ORAL | 0 refills | Status: AC

## 2023-04-02 ENCOUNTER — Encounter (INDEPENDENT_AMBULATORY_CARE_PROVIDER_SITE_OTHER): Payer: Self-pay | Admitting: *Deleted

## 2023-04-14 ENCOUNTER — Ambulatory Visit: Payer: Medicare Other

## 2023-04-15 DIAGNOSIS — M542 Cervicalgia: Secondary | ICD-10-CM | POA: Diagnosis not present

## 2023-04-17 ENCOUNTER — Ambulatory Visit: Admitting: Orthopedic Surgery

## 2023-04-17 ENCOUNTER — Encounter: Payer: Self-pay | Admitting: Orthopedic Surgery

## 2023-04-17 VITALS — BP 138/78 | HR 78 | Ht 63.0 in | Wt 172.0 lb

## 2023-04-17 DIAGNOSIS — G5603 Carpal tunnel syndrome, bilateral upper limbs: Secondary | ICD-10-CM

## 2023-04-17 NOTE — Patient Instructions (Signed)
We are referring you to [facility] from [facility] Office address is [address] [place] The phone number is [phone]  The office will call you with an appointment Dr. Newton will do the nerve study   
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
## 2023-04-17 NOTE — Progress Notes (Signed)
  Intake history:  BP 138/78   Pulse 78   Ht 5\' 3"  (1.6 m)   Wt 172 lb (78 kg)   BMI 30.47 kg/m  Body mass index is 30.47 kg/m.    WHAT ARE WE SEEING YOU FOR TODAY?   bilateral ( all ) finger(s) itching and feels needles   How long has this bothered you? (DOI?DOS?WS?)  Has noticed increased recently   Anticoag.  No  Diabetes Yes  Heart disease No  Hypertension Yes  SMOKING HX No  Kidney disease No  Any ALLERGIES ______________________________________________   Treatment:  Have you taken:  Tylenol No  Advil No  Had PT No  Had injection No  Other  _________________________

## 2023-04-22 ENCOUNTER — Ambulatory Visit: Admitting: Physical Medicine and Rehabilitation

## 2023-04-22 DIAGNOSIS — M79641 Pain in right hand: Secondary | ICD-10-CM

## 2023-04-22 DIAGNOSIS — Z981 Arthrodesis status: Secondary | ICD-10-CM

## 2023-04-22 DIAGNOSIS — R29898 Other symptoms and signs involving the musculoskeletal system: Secondary | ICD-10-CM

## 2023-04-22 DIAGNOSIS — M79642 Pain in left hand: Secondary | ICD-10-CM

## 2023-04-22 DIAGNOSIS — R202 Paresthesia of skin: Secondary | ICD-10-CM

## 2023-04-22 DIAGNOSIS — M542 Cervicalgia: Secondary | ICD-10-CM

## 2023-04-22 NOTE — Progress Notes (Signed)
 Pain Scale   Average Pain 4 Patient advising she is Right hand dominate Patient states she has numbness and tingling bilaterally. She also advising that she at times drops things and states she is unable to sleep due to the pain in bilateral hands.         +Driver, -BT, -Dye Allergies.

## 2023-04-23 NOTE — Procedures (Signed)
 EMG & NCV Findings: Evaluation of the left median (across palm) sensory nerve showed no response (Palm), prolonged distal peak latency (4.9 ms), and reduced amplitude (8.9 V).  The right median (across palm) sensory nerve showed prolonged distal peak latency (Wrist, 4.2 ms) and prolonged distal peak latency (Palm, 15.2 ms).  All remaining nerves (as indicated in the following tables) were within normal limits.  All left vs. right side differences were within normal limits.    All examined muscles (as indicated in the following table) showed no evidence of electrical instability.    Impression: The above electrodiagnostic study is ABNORMAL and reveals evidence of:  a mild to moderate right median nerve entrapment at the wrist (carpal tunnel syndrome) affecting sensory components.   a mild left median nerve entrapment at the wrist (carpal tunnel syndrome) affecting sensory components.  There is no significant electrodiagnostic evidence of any other focal nerve entrapment, brachial plexopathy or cervical radiculopathy.   Recommendations: 1.  Follow-up with referring physician. 2.  Continue current management of symptoms. 3.  Continue use of resting splint at night-time and as needed during the day.  Consider diagnostic carpal tunnel injection.  ___________________________ Collin Deal FAAPMR Board Certified, American Board of Physical Medicine and Rehabilitation    Nerve Conduction Studies Anti Sensory Summary Table   Stim Site NR Peak (ms) Norm Peak (ms) P-T Amp (V) Norm P-T Amp Site1 Site2 Delta-P (ms) Dist (cm) Vel (m/s) Norm Vel (m/s)  Left Median Acr Palm Anti Sensory (2nd Digit)  30.6C  Wrist    *4.9 <3.6 *8.9 >10 Wrist Palm  0.0    Palm *NR  <2.0          Right Median Acr Palm Anti Sensory (2nd Digit)  30.5C  Wrist    *4.2 <3.6 14.8 >10 Wrist Palm 11.0 0.0    Palm    *15.2 <2.0 1.0         Left Radial Anti Sensory (Base 1st Digit)  30.3C  Wrist    2.7 <3.1 6.3  Wrist Base  1st Digit 2.7 0.0    Right Radial Anti Sensory (Base 1st Digit)  29.6C  Wrist    2.5 <3.1 16.6  Wrist Base 1st Digit 2.5 0.0    Left Ulnar Anti Sensory (5th Digit)  30.6C  Wrist    3.4 <3.7 15.5 >15.0 Wrist 5th Digit 3.4 14.0 41 >38  Right Ulnar Anti Sensory (5th Digit)  30.7C  Wrist    3.2 <3.7 18.4 >15.0 Wrist 5th Digit 3.2 14.0 44 >38   Motor Summary Table   Stim Site NR Onset (ms) Norm Onset (ms) O-P Amp (mV) Norm O-P Amp Site1 Site2 Delta-0 (ms) Dist (cm) Vel (m/s) Norm Vel (m/s)  Left Median Motor (Abd Poll Brev)  30.3C  Wrist    4.0 <4.2 7.5 >5 Elbow Wrist 4.2 21.0 50 >50  Elbow    8.2  7.5         Right Median Motor (Abd Poll Brev)  29.9C  Wrist    4.0 <4.2 5.0 >5 Elbow Wrist 4.2 21.0 50 >50  Elbow    8.2  4.8         Left Ulnar Motor (Abd Dig Min)  30.6C  Wrist    3.1 <4.2 7.8 >3 B Elbow Wrist 3.5 19.5 56 >53  B Elbow    6.6  6.9  A Elbow B Elbow 1.6 10.0 63 >53  A Elbow    8.2  7.4  Right Ulnar Motor (Abd Dig Min)  30.1C  Wrist    2.9 <4.2 8.8 >3 B Elbow Wrist 3.6 20.5 57 >53  B Elbow    6.5  6.8  A Elbow B Elbow 1.5 10.0 67 >53  A Elbow    8.0  7.5          EMG   Side Muscle Nerve Root Ins Act Fibs Psw Amp Dur Poly Recrt Int Deatra Face Comment  Right Abd Poll Brev Median C8-T1 Nml Nml Nml Nml Nml 0 Nml Nml   Right 1stDorInt Ulnar C8-T1 Nml Nml Nml Nml Nml 0 Nml Nml   Right Deltoid Axillary C5-6 Nml Nml Nml Nml Nml 0 Nml Nml   Right Ext Digitorum  Radial (Post Int) C7-8 Nml Nml Nml Nml Nml 0 Nml Nml   Right Triceps Radial C6-7-8 Nml Nml Nml Nml Nml 0 Nml Nml     Nerve Conduction Studies Anti Sensory Left/Right Comparison   Stim Site L Lat (ms) R Lat (ms) L-R Lat (ms) L Amp (V) R Amp (V) L-R Amp (%) Site1 Site2 L Vel (m/s) R Vel (m/s) L-R Vel (m/s)  Median Acr Palm Anti Sensory (2nd Digit)  30.6C  Wrist *4.9 *4.2 0.7 *8.9 14.8 39.9 Wrist Palm     Palm  *15.2   1.0        Radial Anti Sensory (Base 1st Digit)  30.3C  Wrist 2.7 2.5 0.2 6.3 16.6 62.0 Wrist  Base 1st Digit     Ulnar Anti Sensory (5th Digit)  30.6C  Wrist 3.4 3.2 0.2 15.5 18.4 15.8 Wrist 5th Digit 41 44 3   Motor Left/Right Comparison   Stim Site L Lat (ms) R Lat (ms) L-R Lat (ms) L Amp (mV) R Amp (mV) L-R Amp (%) Site1 Site2 L Vel (m/s) R Vel (m/s) L-R Vel (m/s)  Median Motor (Abd Poll Brev)  30.3C  Wrist 4.0 4.0 0.0 7.5 5.0 33.3 Elbow Wrist 50 50 0  Elbow 8.2 8.2 0.0 7.5 4.8 36.0       Ulnar Motor (Abd Dig Min)  30.6C  Wrist 3.1 2.9 0.2 7.8 8.8 11.4 B Elbow Wrist 56 57 1  B Elbow 6.6 6.5 0.1 6.9 6.8 1.4 A Elbow B Elbow 63 67 4  A Elbow 8.2 8.0 0.2 7.4 7.5 1.3          Waveforms:

## 2023-04-30 ENCOUNTER — Encounter: Payer: Self-pay | Admitting: Physical Medicine and Rehabilitation
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
## 2023-05-01 ENCOUNTER — Telehealth: Payer: Self-pay | Admitting: Family Medicine

## 2023-05-01 ENCOUNTER — Encounter (INDEPENDENT_AMBULATORY_CARE_PROVIDER_SITE_OTHER): Payer: Self-pay | Admitting: Gastroenterology

## 2023-05-01 ENCOUNTER — Ambulatory Visit (INDEPENDENT_AMBULATORY_CARE_PROVIDER_SITE_OTHER): Admitting: Gastroenterology

## 2023-05-01 ENCOUNTER — Other Ambulatory Visit: Payer: Self-pay

## 2023-05-01 VITALS — BP 123/65 | HR 105 | Temp 97.8°F | Ht 63.0 in | Wt 172.6 lb

## 2023-05-01 DIAGNOSIS — R194 Change in bowel habit: Secondary | ICD-10-CM

## 2023-05-01 DIAGNOSIS — K5900 Constipation, unspecified: Secondary | ICD-10-CM

## 2023-05-01 MED ORDER — LANCETS MISC. MISC: 1.0000 | Freq: Three times a day (TID) | 0 refills | Status: AC

## 2023-05-01 NOTE — Patient Instructions (Addendum)
 Take 2 pills of stool softener daily Continue Metamucil daily Decrease intake of multivitamin with calcium  to once a day dosing If persistent symptoms, we will discuss again proceeding with a colonoscopy.
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
## 2023-05-01 NOTE — Telephone Encounter (Signed)
 Pt came by office. Informed

## 2023-05-01 NOTE — Telephone Encounter (Signed)
 Order for Lancets sent to [facility]. Tried calling pt, n/a. Unable to leave vm

## 2023-05-01 NOTE — Telephone Encounter (Signed)
 Copied from CRM [id number]. Topic: Clinical - Medication Question >> May 01, 2023 11:16 AM Juluis Ok wrote: Reason for CRM: Patient is needing needles to check her blood glucose, states she is out and wants to know if a prescription can be called in to her pharmacy or if she can pick up some from provider's office.  Callback # [phone]  [id number] - [place], [place] - [address] [place] [address] Phone: [phone] Fax: [phone] Hours: Not open 24 hours

## 2023-05-02 MED ORDER — LANCETS 30G MISC: 3 refills | Status: DC

## 2023-05-02 NOTE — Addendum Note (Signed)
 Addended by: Louana Roup on: 05/02/2023 08:59 AM   Modules accepted: Orders

## 2023-05-08 DIAGNOSIS — E1151 Type 2 diabetes mellitus with diabetic peripheral angiopathy without gangrene: Secondary | ICD-10-CM | POA: Diagnosis not present

## 2023-05-08 DIAGNOSIS — L110 Acquired keratosis follicularis: Secondary | ICD-10-CM | POA: Diagnosis not present

## 2023-05-08 DIAGNOSIS — E1140 Type 2 diabetes mellitus with diabetic neuropathy, unspecified: Secondary | ICD-10-CM | POA: Diagnosis not present

## 2023-05-08 DIAGNOSIS — I739 Peripheral vascular disease, unspecified: Secondary | ICD-10-CM | POA: Diagnosis not present

## 2023-05-08 DIAGNOSIS — M79671 Pain in right foot: Secondary | ICD-10-CM | POA: Diagnosis not present

## 2023-05-08 DIAGNOSIS — M79672 Pain in left foot: Secondary | ICD-10-CM | POA: Diagnosis not present

## 2023-05-09 ENCOUNTER — Other Ambulatory Visit: Payer: Self-pay

## 2023-05-09 ENCOUNTER — Encounter (HOSPITAL_COMMUNITY): Payer: Self-pay

## 2023-05-09 ENCOUNTER — Ambulatory Visit (HOSPITAL_COMMUNITY): Attending: Student

## 2023-05-09 DIAGNOSIS — R29898 Other symptoms and signs involving the musculoskeletal system: Secondary | ICD-10-CM | POA: Insufficient documentation

## 2023-05-09 DIAGNOSIS — M542 Cervicalgia: Secondary | ICD-10-CM | POA: Diagnosis not present

## 2023-05-09 DIAGNOSIS — Z7409 Other reduced mobility: Secondary | ICD-10-CM | POA: Diagnosis not present
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
## 2023-05-12 ENCOUNTER — Encounter (HOSPITAL_COMMUNITY): Admitting: Physical Therapy

## 2023-05-13 ENCOUNTER — Ambulatory Visit (HOSPITAL_COMMUNITY)

## 2023-05-13 ENCOUNTER — Encounter (HOSPITAL_COMMUNITY): Payer: Self-pay

## 2023-05-13 DIAGNOSIS — M542 Cervicalgia: Secondary | ICD-10-CM

## 2023-05-13 DIAGNOSIS — R29898 Other symptoms and signs involving the musculoskeletal system: Secondary | ICD-10-CM | POA: Diagnosis not present

## 2023-05-13 DIAGNOSIS — Z7409 Other reduced mobility: Secondary | ICD-10-CM | POA: Diagnosis not present
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
## 2023-05-14 MED ORDER — LANCET DEVICE MISC: 0 refills | Status: DC

## 2023-05-14 NOTE — Addendum Note (Signed)
 Addended by: Louana Roup on: 05/14/2023 02:59 PM   Modules accepted: Orders

## 2023-05-15 ENCOUNTER — Ambulatory Visit (INDEPENDENT_AMBULATORY_CARE_PROVIDER_SITE_OTHER)

## 2023-05-15 ENCOUNTER — Encounter (HOSPITAL_COMMUNITY): Payer: Self-pay

## 2023-05-15 DIAGNOSIS — R29898 Other symptoms and signs involving the musculoskeletal system: Secondary | ICD-10-CM

## 2023-05-15 DIAGNOSIS — M542 Cervicalgia: Secondary | ICD-10-CM | POA: Diagnosis not present

## 2023-05-15 DIAGNOSIS — Z7409 Other reduced mobility: Secondary | ICD-10-CM | POA: Diagnosis not present
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
## 2023-05-21 ENCOUNTER — Ambulatory Visit (HOSPITAL_COMMUNITY)

## 2023-05-21 DIAGNOSIS — Z7409 Other reduced mobility: Secondary | ICD-10-CM | POA: Diagnosis not present

## 2023-05-21 DIAGNOSIS — M542 Cervicalgia: Secondary | ICD-10-CM

## 2023-05-21 DIAGNOSIS — R29898 Other symptoms and signs involving the musculoskeletal system: Secondary | ICD-10-CM | POA: Diagnosis not present
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
## 2023-05-28 ENCOUNTER — Ambulatory Visit (HOSPITAL_COMMUNITY): Admitting: Physical Therapy

## 2023-05-28 ENCOUNTER — Ambulatory Visit (INDEPENDENT_AMBULATORY_CARE_PROVIDER_SITE_OTHER)

## 2023-05-28 VITALS — Ht 63.0 in | Wt 172.0 lb

## 2023-05-28 DIAGNOSIS — Z789 Other specified health status: Secondary | ICD-10-CM

## 2023-05-28 DIAGNOSIS — W19XXXS Unspecified fall, sequela: Secondary | ICD-10-CM

## 2023-05-28 DIAGNOSIS — R29898 Other symptoms and signs involving the musculoskeletal system: Secondary | ICD-10-CM

## 2023-05-28 DIAGNOSIS — Z7189 Other specified counseling: Secondary | ICD-10-CM

## 2023-05-28 DIAGNOSIS — Z7409 Other reduced mobility: Secondary | ICD-10-CM

## 2023-05-28 DIAGNOSIS — Z0001 Encounter for general adult medical examination with abnormal findings: Secondary | ICD-10-CM | POA: Diagnosis not present

## 2023-05-28 DIAGNOSIS — Z5941 Food insecurity: Secondary | ICD-10-CM

## 2023-05-28 DIAGNOSIS — Z599 Problem related to housing and economic circumstances, unspecified: Secondary | ICD-10-CM | POA: Diagnosis not present

## 2023-05-28 DIAGNOSIS — Z9181 History of falling: Secondary | ICD-10-CM

## 2023-05-28 DIAGNOSIS — M542 Cervicalgia: Secondary | ICD-10-CM | POA: Diagnosis not present

## 2023-05-28 DIAGNOSIS — Z0000 Encounter for general adult medical examination without abnormal findings: Secondary | ICD-10-CM

## 2023-05-28 DIAGNOSIS — Z780 Asymptomatic menopausal state: Secondary | ICD-10-CM
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
## 2023-05-28 NOTE — Patient Instructions (Signed)
 [name] , Thank you for taking time out of your busy schedule to complete your Annual Wellness Visit with me. I enjoyed our conversation and look forward to speaking with you again next year. I, as well as your care team,  appreciate your ongoing commitment to your health goals. Please review the following plan we discussed and let me know if I can assist you in the future.  Your Game plan/ To Do List     Referrals: If you haven't heard from the office you've been referred to, please reach out to them at the phone number provided.  Your osteoporosis screening has been scheduled for Thursday Jun 05, 2023 at 10:30 am at [facility]. Please arrive at Radiology 15 minutes prior to your appointment time.  -Make sure to wear two-piece clothing. Make sure to bring picture ID and insurance card.  -Bring list of medications you are currently taking including any supplements.  -Please discontinue any medications that contain calcium  at least 48 hours (2 days) prior to your scan.    A referral has been placed for you to check and see what additional resources are available to you.   If you haven't heard from anyone within the next 7 business days, please call them and let them know a referral has been placed for you Phone: [phone]  Follow up Visits:  Next Medicare AWV with our clinical staff: Jun 01, 2024 at 1:50 pm telephone visit    Have you seen your provider in the last 6 months (3 months if uncontrolled diabetes)? yes  Next Office Visit with your provider: July 24, 2023 at 8:00 in office  Clinician Recommendations:    Aim for 30 minutes of exercise or brisk walking, 6-8 glasses of water, and 5 servings of fruits and vegetables each day.   I enjoyed our conversation today and look forward to talking with you again next year!! Have a wonderful and safe year. All the best, Arlo Buffone      This is a list of the screening recommended for you and due dates:  Health Maintenance  Topic  Date Due   DEXA scan (bone density measurement)  08/08/2013   COVID-19 Vaccine (5 - 2024-25 season) 12/06/2022   Eye exam for diabetics  05/16/2023   Flu Shot  08/08/2023   Hemoglobin A1C  09/20/2023   Complete foot exam   10/23/2023   Medicare Annual Wellness Visit  05/27/2024   DTaP/Tdap/Td vaccine (3 - Td or Tdap) 09/18/2031   Pneumonia Vaccine  Completed   Zoster (Shingles) Vaccine  Completed   HPV Vaccine  Aged Out   Meningitis B Vaccine  Aged Out    Advanced directives: (Declined) Advance directive discussed with you today. Even though you declined this today, please call our office should you change your mind, and we can give you the proper paperwork for you to fill out. Advance Care Planning is important because it:  [x]  Makes sure you receive the medical care that is consistent with your values, goals, and preferences  [x]  It provides guidance to your family and loved ones and reduces their decisional burden about whether or not they are making the right decisions based on your wishes.  Follow the link provided in your after visit summary or read over the paperwork we have mailed to you to help you started getting your Advance Directives in place. If you need assistance in completing these, please reach out to us  so that we can help you!  See attachments for Preventive Care and Fall Prevention Tips.   Managing Pain Without Opioids  Opioids are strong medicines used to treat moderate to severe pain. For some people, especially those who have long-term (chronic) pain, opioids may not be the best choice for pain management due to: Side effects like nausea, constipation, and sleepiness. The risk of addiction (opioid use disorder). The longer you take opioids, the greater your risk of addiction. Pain that lasts for more than 3 months is called chronic pain. Managing chronic pain usually requires more than one approach and is often provided by a team of health care providers working  together (multidisciplinary approach). Pain management may be done at a pain management center or pain clinic. How to manage pain without the use of opioids Use non-opioid medicines Non-opioid medicines for pain may include: Over-the-counter or prescription non-steroidal anti-inflammatory drugs (NSAIDs). These may be the first medicines used for pain. They work well for muscle and bone pain, and they reduce swelling. Acetaminophen . This over-the-counter medicine may work well for milder pain but not swelling. Antidepressants. These may be used to treat chronic pain. A certain type of antidepressant (tricyclics) is often used. These medicines are given in lower doses for pain than when used for depression. Anticonvulsants. These are usually used to treat seizures but may also reduce nerve (neuropathic) pain. Muscle relaxants. These relieve pain caused by sudden muscle tightening (spasms). You may also use a pain medicine that is applied to the skin as a patch, cream, or gel (topical analgesic), such as a numbing medicine. These may cause fewer side effects than medicines taken by mouth. Do certain therapies as directed Some therapies can help with pain management. They include: Physical therapy. You will do exercises to gain strength and flexibility. A physical therapist may teach you exercises to move and stretch parts of your body that are weak, stiff, or painful. You can learn these exercises at physical therapy visits and practice them at home. Physical therapy may also involve: Massage. Heat wraps or applying heat or cold to affected areas. Electrical signals that interrupt pain signals (transcutaneous electrical nerve stimulation, TENS). Weak lasers that reduce pain and swelling (low-level laser therapy). Signals from your body that help you learn to regulate pain (biofeedback). Occupational therapy. This helps you to learn ways to function at home and work with less pain. Recreational  therapy. This involves trying new activities or hobbies, such as a physical activity or drawing. Mental health therapy, including: Cognitive behavioral therapy (CBT). This helps you learn coping skills for dealing with pain. Acceptance and commitment therapy (ACT) to change the way you think and react to pain. Relaxation therapies, including muscle relaxation exercises and mindfulness-based stress reduction. Pain management counseling. This may be individual, family, or group counseling.  Receive medical treatments Medical treatments for pain management include: Nerve block injections. These may include a pain blocker and anti-inflammatory medicines. You may have injections: Near the spine to relieve chronic back or neck pain. Into joints to relieve back or joint pain. Into nerve areas that supply a painful area to relieve body pain. Into muscles (trigger point injections) to relieve some painful muscle conditions. A medical device placed near your spine to help block pain signals and relieve nerve pain or chronic back pain (spinal cord stimulation device). Acupuncture. Follow these instructions at home Medicines Take over-the-counter and prescription medicines only as told by your health care provider. If you are taking pain medicine, ask your health care providers about possible side  effects to watch out for. Do not drive or use heavy machinery while taking prescription opioid pain medicine. Lifestyle  Do not use drugs or alcohol to reduce pain. If you drink alcohol, limit how much you have to: 0-1 drink a day for women who are not pregnant. 0-2 drinks a day for men. Know how much alcohol is in a drink. In the [place] one drink equals one 12 oz bottle of beer (355 mL), one 5 oz glass of wine (148 mL), or one 1 oz glass of hard liquor (44 mL). Do not use any products that contain nicotine or tobacco. These products include cigarettes, chewing tobacco, and vaping devices, such as  e-cigarettes. If you need help quitting, ask your health care provider. Eat a healthy diet and maintain a healthy weight. Poor diet and excess weight may make pain worse. Eat foods that are high in fiber. These include fresh fruits and vegetables, whole grains, and beans. Limit foods that are high in fat and processed sugars, such as fried and sweet foods. Exercise regularly. Exercise lowers stress and may help relieve pain. Ask your health care provider what activities and exercises are safe for you. If your health care provider approves, join an exercise class that combines movement and stress reduction. Examples include yoga and tai chi. Get enough sleep. Lack of sleep may make pain worse. Lower stress as much as possible. Practice stress reduction techniques as told by your therapist. General instructions Work with all your pain management providers to find the treatments that work best for you. You are an important member of your pain management team. There are many things you can do to reduce pain on your own. Consider joining an online or in-person support group for people who have chronic pain. Keep all follow-up visits. This is important. Where to find more information You can find more information about managing pain without opioids from: American Academy of Pain Medicine: painmed.org Institute for Chronic Pain: instituteforchronicpain.org American Chronic Pain Association: theacpa.org Contact a health care provider if: You have side effects from pain medicine. Your pain gets worse or does not get better with treatments or home therapy. You are struggling with anxiety or depression. Summary Many types of pain can be managed without opioids. Chronic pain may respond better to pain management without opioids. Pain is best managed when you and a team of health care providers work together. Pain management without opioids may include non-opioid medicines, medical treatments, physical  therapy, mental health therapy, and lifestyle changes. Tell your health care providers if your pain gets worse or is not being managed well enough. This information is not intended to replace advice given to you by your health care provider. Make sure you discuss any questions you have with your health care provider. Document Revised: 04/05/2020 Document Reviewed: 04/05/2020 Elsevier Patient Education  2023 [employer]. Understanding Your Risk for Falls Millions of people have serious injuries from falls each year. It is important to understand your risk of falling. Talk with your health care provider about your risk and what you can do to lower it. If you do have a serious fall, make sure to tell your provider. Falling once raises your risk of falling again. How can falls affect me? Serious injuries from falls are common. These include: Broken bones, such as hip fractures. Head injuries, such as traumatic brain injuries (TBI) or concussions. A fear of falling can cause you to avoid activities and stay at home. This can make your muscles  weaker and raise your risk for a fall. What can increase my risk? There are a number of risk factors that increase your risk for falling. The more risk factors you have, the higher your risk of falling. Serious injuries from a fall happen most often to people who are older than 88 years old. Teenagers and young adults ages 32-29 are also at higher risk. Common risk factors include: Weakness in the lower body. Being generally weak or confused due to long-term (chronic) illness. Dizziness or balance problems. Poor vision. Medicines that cause dizziness or drowsiness. These may include: Medicines for your blood pressure, heart, anxiety, insomnia, or swelling (edema). Pain medicines. Muscle relaxants. Other risk factors include: Drinking alcohol. Having had a fall in the past. Having foot pain or wearing improper footwear. Working at a dangerous job. Having  any of the following in your home: Tripping hazards, such as floor clutter or loose rugs. Poor lighting. Pets. Having dementia or memory loss. What actions can I take to lower my risk of falling?     Physical activity Stay physically fit. Do strength and balance exercises. Consider taking a regular class to build strength and balance. Yoga and tai chi are good options. Vision Have your eyes checked every year and your prescription for glasses or contacts updated as needed. Shoes and walking aids Wear non-skid shoes. Wear shoes that have rubber soles and low heels. Do not wear high heels. Do not walk around the house in socks or slippers. Use a cane or walker as told by your provider. Home safety Attach secure railings on both sides of your stairs. Install grab bars for your bathtub, shower, and toilet. Use a non-skid mat in your bathtub or shower. Attach bath mats securely with double-sided, non-slip rug tape. Use good lighting in all rooms. Keep a flashlight near your bed. Make sure there is a clear path from your bed to the bathroom. Use night-lights. Do not use throw rugs. Make sure all carpeting is taped or tacked down securely. Remove all clutter from walkways and stairways, including extension cords. Repair uneven or broken steps and floors. Avoid walking on icy or slippery surfaces. Walk on the grass instead of on icy or slick sidewalks. Use ice melter to get rid of ice on walkways in the winter. Use a cordless phone. Questions to ask your health care provider Can you help me check my risk for a fall? Do any of my medicines make me more likely to fall? Should I take a vitamin D  supplement? What exercises can I do to improve my strength and balance? Should I make an appointment to have my vision checked? Do I need a bone density test to check for weak bones (osteoporosis)? Would it help to use a cane or a walker? Where to find more information Centers for Disease Control  and Prevention, [employer]: TonerPromos.no Community-Based Fall Prevention Programs: TonerPromos.no [employer] on Aging: BaseRingTones.pl Contact a health care provider if: You fall at home. You are afraid of falling at home. You feel weak, drowsy, or dizzy. This information is not intended to replace advice given to you by your health care provider. Make sure you discuss any questions you have with your health care provider. Document Revised: 08/27/2021 Document Reviewed: 08/27/2021 Elsevier Patient Education  2024 [employer].
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
## 2023-05-30 ENCOUNTER — Telehealth: Payer: Self-pay

## 2023-05-30 NOTE — Progress Notes (Signed)
 Complex Care Management Note  Care Guide Note 05/30/2023 Name: Lisa Mann MRN: [id number] DOB: 1933/06/03  Lisa Mann is a 89 y.o. year old female who sees Towanda Fret, MD for primary care. I reached out to [name] by phone today to offer complex care management services.  Lisa Mann was given information about Complex Care Management services today including:   The Complex Care Management services include support from the care team which includes your Nurse Care Manager, Clinical Social Worker, or Pharmacist.  The Complex Care Management team is here to help remove barriers to the health concerns and goals most important to you. Complex Care Management services are voluntary, and the patient may decline or stop services at any time by request to their care team member.   Complex Care Management Consent Status: Patient agreed to services and verbal consent obtained.   Follow up plan:  Telephone appointment with complex care management team member scheduled for:  BSW 06/04/2023 RNCM 06/25/2023  Encounter Outcome:  Patient Scheduled  Lenton Rail , RMA     [place]  [facility] Guide  Direct Dial: [phone]  Website: [email or web address]

## 2023-06-04 ENCOUNTER — Ambulatory Visit (INDEPENDENT_AMBULATORY_CARE_PROVIDER_SITE_OTHER): Admitting: Physical Therapy

## 2023-06-04 ENCOUNTER — Other Ambulatory Visit: Payer: Self-pay

## 2023-06-04 DIAGNOSIS — R29898 Other symptoms and signs involving the musculoskeletal system: Secondary | ICD-10-CM | POA: Diagnosis not present

## 2023-06-04 DIAGNOSIS — Z7409 Other reduced mobility: Secondary | ICD-10-CM | POA: Diagnosis not present

## 2023-06-04 DIAGNOSIS — M542 Cervicalgia: Secondary | ICD-10-CM | POA: Diagnosis not present
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
## 2023-06-04 NOTE — Patient Instructions (Signed)
 Visit Information  Thank you for taking time to visit with me today. Please don't hesitate to contact me if I can be of assistance to you before our next scheduled appointment.    Following is a copy of your care plan:   Goals Addressed             This Visit's Progress    BSW-VBCI Social Work Care Plan       Problems:   Patient is interested in Personal Care Services  CSW Clinical Goal(s):   Patient will continue to work with her daughter to provide some support with personal care as patient is still able to perform most ADL's independently.  Interventions:  Social Determinants of Health in Patient with DMII: SDOH assessments completed: Personal care Evaluation of current treatment plan related to unmet needs Patient can not afford self pay and does not have insurance to cover PCS.  Patient Goals/Self-Care Activities:  Patient will continue to manage personal care needs with the support of her daughter who lives in the home.  Plan:   No further follow up required: Patient will follow up if needed in the future.        Please call 911 if you are experiencing a Mental Health or Behavioral Health Crisis or need someone to talk to.  Patient verbalizes understanding of instructions and care plan provided today and agrees to view in MyChart. Active MyChart status and patient understanding of how to access instructions and care plan via MyChart confirmed with patient.     Dallis Dues, BSW [place]  [facility], [facility] Social Worker Direct Dial: [phone]  Fax: [phone] Website: [email or web address]

## 2023-06-04 NOTE — Patient Outreach (Signed)
 Complex Care Management   Visit Note  06/04/2023  Name:  Lisa Mann MRN: [id number] DOB: 02-02-1933  Situation: Referral received for Complex Care Management related to Personal Care Services I obtained verbal consent from Patient.  Visit completed with patient  on the phone  Background:   Past Medical History:  Diagnosis Date   Breast cancer, right breast (HCC) 2010   Cancer (HCC) 2008 and 2010   , first was left then right   Cancer of breast, intraductal 2008    left ( treated surgically and with radiation)   Diabetes mellitus, type 2 (HCC)    controlled    DJD (degenerative joint disease)    Fracture of pelvis    Fracture of wrist    left    Fracture, ribs    GERD (gastroesophageal reflux disease)    Gout    History of hiatal hernia    Hyperlipidemia    Hypertension    Low back pain    Lumbar radiculopathy    Obesity     Assessment:  Patient reports her daughter and grandson live in the home.  Patient is able to perform ADL's but needs a little assistance with cooking and bathing due to mobility issues.  Patient is very independent.  Patient is unable to pay for personal care and her insurance does not cover the cost.  SW does inform patient of ADTS to apply for services if patient is no longer able to care for herself and no one to assist.  SW did inform patient that ADTS has a waiting list for assistance.  SDOH Interventions    Flowsheet Row Patient Outreach Telephone from 06/04/2023 in [facility] Clinical Support from 05/28/2023 in [facility] [place] [facility] Clinical Support from 04/11/2022 in [facility] Clinical Support from 09/19/2020 in [facility]  SDOH Interventions      Food Insecurity Interventions Intervention Not Indicated  [daughter contributes to food purchases] AMB Referral Intervention Not Indicated Intervention Not Indicated  Housing Interventions Intervention Not  Indicated Intervention Not Indicated Intervention Not Indicated Intervention Not Indicated  Transportation Interventions Intervention Not Indicated  [Drives] Intervention Not Indicated Intervention Not Indicated Intervention Not Indicated  Utilities Interventions Intervention Not Indicated Intervention Not Indicated Intervention Not Indicated --  Alcohol Usage Interventions -- Intervention Not Indicated (Score <7) Intervention Not Indicated (Score <7) --  Depression Interventions/Treatment  -- PHQ2-9 Score <4 Follow-up Not Indicated -- --  Financial Strain Interventions Intervention Not Indicated Community Resources Provided Intervention Not Indicated Intervention Not Indicated  Physical Activity Interventions -- Intervention Not Indicated, [facility] Intervention Not Indicated Intervention Not Indicated  Stress Interventions -- Intervention Not Indicated Intervention Not Indicated Intervention Not Indicated  Social Connections Interventions -- Intervention Not Indicated Intervention Not Indicated Intervention Not Indicated  Health Literacy Interventions -- Intervention Not Indicated -- --        Recommendation:   No recommendations at this time.  Follow Up Plan:   Patient has met all care management goals. Care Management case will be closed. Patient has been provided contact information should new needs arise.   Dallis Dues, BSW [place]  [facility], [facility] Social Worker Direct Dial: [phone]  Fax: [phone] Website: [email or web address]

## 2023-06-05 ENCOUNTER — Other Ambulatory Visit (HOSPITAL_COMMUNITY)

## 2023-06-06 ENCOUNTER — Other Ambulatory Visit: Payer: Self-pay | Admitting: Family Medicine

## 2023-06-09 ENCOUNTER — Ambulatory Visit (HOSPITAL_COMMUNITY): Attending: Student | Admitting: Physical Therapy

## 2023-06-09 ENCOUNTER — Other Ambulatory Visit: Payer: Self-pay | Admitting: Family Medicine

## 2023-06-09 DIAGNOSIS — M542 Cervicalgia: Secondary | ICD-10-CM | POA: Diagnosis not present

## 2023-06-09 DIAGNOSIS — R29898 Other symptoms and signs involving the musculoskeletal system: Secondary | ICD-10-CM | POA: Insufficient documentation

## 2023-06-09 DIAGNOSIS — Z7409 Other reduced mobility: Secondary | ICD-10-CM | POA: Diagnosis not present
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
## 2023-06-11 ENCOUNTER — Ambulatory Visit (HOSPITAL_COMMUNITY)

## 2023-06-11 ENCOUNTER — Encounter (HOSPITAL_COMMUNITY): Payer: Self-pay

## 2023-06-11 DIAGNOSIS — R29898 Other symptoms and signs involving the musculoskeletal system: Secondary | ICD-10-CM | POA: Diagnosis not present

## 2023-06-11 DIAGNOSIS — M542 Cervicalgia: Secondary | ICD-10-CM | POA: Diagnosis not present

## 2023-06-11 DIAGNOSIS — Z7409 Other reduced mobility: Secondary | ICD-10-CM | POA: Diagnosis not present
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
## 2023-06-16 ENCOUNTER — Ambulatory Visit (HOSPITAL_COMMUNITY): Admitting: Physical Therapy

## 2023-06-16 DIAGNOSIS — Z7409 Other reduced mobility: Secondary | ICD-10-CM

## 2023-06-16 DIAGNOSIS — M542 Cervicalgia: Secondary | ICD-10-CM | POA: Diagnosis not present

## 2023-06-16 DIAGNOSIS — R29898 Other symptoms and signs involving the musculoskeletal system: Secondary | ICD-10-CM
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
## 2023-06-18 ENCOUNTER — Encounter (HOSPITAL_COMMUNITY): Payer: Self-pay

## 2023-06-18 ENCOUNTER — Ambulatory Visit (HOSPITAL_COMMUNITY)

## 2023-06-18 DIAGNOSIS — R29898 Other symptoms and signs involving the musculoskeletal system: Secondary | ICD-10-CM | POA: Diagnosis not present

## 2023-06-18 DIAGNOSIS — Z7409 Other reduced mobility: Secondary | ICD-10-CM

## 2023-06-18 DIAGNOSIS — M542 Cervicalgia: Secondary | ICD-10-CM | POA: Diagnosis not present
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
## 2023-06-23 ENCOUNTER — Ambulatory Visit (HOSPITAL_COMMUNITY)

## 2023-06-23 ENCOUNTER — Encounter (HOSPITAL_COMMUNITY): Payer: Self-pay

## 2023-06-23 DIAGNOSIS — Z7409 Other reduced mobility: Secondary | ICD-10-CM | POA: Diagnosis not present

## 2023-06-23 DIAGNOSIS — M542 Cervicalgia: Secondary | ICD-10-CM

## 2023-06-23 DIAGNOSIS — R29898 Other symptoms and signs involving the musculoskeletal system: Secondary | ICD-10-CM
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
## 2023-06-25 ENCOUNTER — Encounter: Payer: Self-pay | Admitting: *Deleted

## 2023-06-25 ENCOUNTER — Other Ambulatory Visit: Payer: Self-pay | Admitting: *Deleted

## 2023-06-25 ENCOUNTER — Encounter (HOSPITAL_COMMUNITY)

## 2023-06-25 ENCOUNTER — Other Ambulatory Visit: Payer: Self-pay

## 2023-06-25 DIAGNOSIS — E119 Type 2 diabetes mellitus without complications: Secondary | ICD-10-CM | POA: Diagnosis not present

## 2023-06-25 DIAGNOSIS — Z961 Presence of intraocular lens: Secondary | ICD-10-CM | POA: Diagnosis not present

## 2023-06-25 DIAGNOSIS — H04123 Dry eye syndrome of bilateral lacrimal glands: Secondary | ICD-10-CM | POA: Diagnosis not present

## 2023-06-25 NOTE — Patient Instructions (Signed)
 Visit Information  Thank you for taking time to visit with me today. Please don't hesitate to contact me if I can be of assistance to you before our next scheduled appointment.  Our next appointment is 07-08-2023 at 9:00 am Please call the care guide team at [phone] if you need to cancel or reschedule your appointment.   Following is a copy of your care plan:   Goals Addressed             This Visit's Progress    VBCI RN Care Plan: DM       Problems:  Chronic Disease Management support and education needs related to DMII  Goal: Over the next 90 days the Patient will attend all scheduled medical appointments: with primary care provider and specialist as evidenced by keeping all scheduled appointments        continue to work with RN Care Manager and/or Social Worker to address care management and care coordination needs related to DMII as evidenced by adherence to care management team scheduled appointments     take all medications exactly as prescribed and will call provider for medication related questions as evidenced by compliance with all medications    verbalize basic understanding of DMII disease process and self health management plan as evidenced by verbal explanation, recognizing/monitoring symptoms, lifestyle changes  Interventions:   Diabetes Interventions: Assessed patient's understanding of A1c goal: <7% Provided education to patient about basic DM disease process Reviewed medications with patient and discussed importance of medication adherence Counseled on importance of regular laboratory monitoring as prescribed Discussed plans with patient for ongoing care management follow up and provided patient with direct contact information for care management team Provided patient with written educational materials related to hypo and hyperglycemia and importance of correct treatment Reviewed scheduled/upcoming provider appointments including: 07-24-2023 with PCP Advised  patient, providing education and rationale, to check cbg daily fasting and record, calling provider for findings outside established parameters Review of patient status, including review of consultants reports, relevant laboratory and other test results, and medications completed Screening for signs and symptoms of depression related to chronic disease state  Assessed social determinant of health barriers Lab Results  Component Value Date   HGBA1C 6.8 (H) 03/20/2023    Patient Self-Care Activities:  Attend all scheduled provider appointments Attend church or other social activities Call pharmacy for medication refills 3-7 days in advance of running out of medications Call provider office for new concerns or questions  Perform all self care activities independently  Perform IADL's (shopping, preparing meals, housekeeping, managing finances) independently Take medications as prescribed   keep appointment with eye doctor check blood sugar at prescribed times: once daily and when you have symptoms of low or high blood sugar check feet daily for cuts, sores or redness drink 6 to 8 glasses of water each day fill half of plate with vegetables limit fast food meals to no more than 1 per week manage portion size prepare main meal at home 3 to 5 days each week set a realistic goal switch to low-fat or skim milk switch to sugar-free drinks do heel pump exercise 2 to 3 times each day keep feet up while sitting wash and dry feet carefully every day wear comfortable, cotton socks wear comfortable, well-fitting shoes  Plan:  Telephone follow up appointment with care management team member scheduled for:  07-08-2023 at 9:00 am             Please call the Suicide and Crisis  Lifeline: 988 call the [place]  National Suicide Prevention Lifeline: 613-059-6745 or TTY: (802)537-1662 TTY 971 410 4239) to talk to a trained counselor call 1-800-273-TALK (toll free, 24 hour hotline) call the  [facility]: (513)470-4939 call 911 if you are experiencing a Mental Health or Behavioral Health Crisis or need someone to talk to.  The patient verbalized understanding of instructions, educational materials, and care plan provided today and agreed to receive a mailed copy of patient instructions, educational materials, and care plan.   Grandville Lax, BSN RN [facility], [facility] RN Care Manager Direct Dial: [phone]  Fax: [phone]  Carbohydrate Counting for Diabetes Mellitus, Adult Carbohydrate counting is a method of keeping track of how many carbohydrates you eat. Eating carbohydrates increases the amount of sugar (glucose) in the blood. Counting how many carbohydrates you eat improves how well you manage your blood glucose. This, in turn, helps you manage your diabetes. Carbohydrates are measured in grams (g) per serving. It is important to know how many carbohydrates (in grams or by serving size) you can have in each meal. This is different for every person. A dietitian can help you make a meal plan and calculate how many carbohydrates you should have at each meal and snack. What foods contain carbohydrates? Carbohydrates are found in the following foods: Grains, such as breads and cereals. Dried beans and soy products. Starchy vegetables, such as potatoes, peas, and corn. Fruit and fruit juices. Milk and yogurt. Sweets and snack foods, such as cake, cookies, candy, chips, and soft drinks. How do I count carbohydrates in foods? There are two ways to count carbohydrates in food. You can read food labels or learn standard serving sizes of foods. You can use either of these methods or a combination of both. Using the Nutrition Facts label The Nutrition Facts list is included on the labels of almost all packaged foods and beverages in the [place] . It includes: The serving size. Information about nutrients in each  serving, including the grams of carbohydrate per serving. To use the Nutrition Facts, decide how many servings you will have. Then, multiply the number of servings by the number of carbohydrates per serving. The resulting number is the total grams of carbohydrates that you will be having. Learning the standard serving sizes of foods When you eat carbohydrate foods that are not packaged or do not include Nutrition Facts on the label, you need to measure the servings in order to count the grams of carbohydrates. Measure the foods that you will eat with a food scale or measuring cup, if needed. Decide how many standard-size servings you will eat. Multiply the number of servings by 15. For foods that contain carbohydrates, one serving equals 15 g of carbohydrates. For example, if you eat 2 cups or 10 oz (300 g) of strawberries, you will have eaten 2 servings and 30 g of carbohydrates (2 servings x 15 g = 30 g). For foods that have more than one food mixed, such as soups and casseroles, you must count the carbohydrates in each food that is included. The following list contains standard serving sizes of common carbohydrate-rich foods. Each of these servings has about 15 g of carbohydrates: 1 slice of bread. 1 six-inch (15 cm) tortilla. ? cup or 2 oz (53 g) cooked rice or pasta.  cup or 3 oz (85 g) cooked or canned, drained and rinsed beans or lentils.  cup or 3 oz (85 g) starchy vegetable, such as peas, corn,  or squash.  cup or 4 oz (120 g) hot cereal.  cup or 3 oz (85 g) boiled or mashed potatoes, or  or 3 oz (85 g) of a large baked potato.  cup or 4 fl oz (118 mL) fruit juice. 1 cup or 8 fl oz (237 mL) milk. 1 small or 4 oz (106 g) apple.  or 2 oz (63 g) of a medium banana. 1 cup or 5 oz (150 g) strawberries. 3 cups or 1 oz (28.3 g) popped popcorn. What is an example of carbohydrate counting? To calculate the grams of carbohydrates in this sample meal, follow the steps shown below. Sample  meal 3 oz (85 g) chicken breast. ? cup or 4 oz (106 g) brown rice.  cup or 3 oz (85 g) corn. 1 cup or 8 fl oz (237 mL) milk. 1 cup or 5 oz (150 g) strawberries with sugar-free whipped topping. Carbohydrate calculation Identify the foods that contain carbohydrates: Rice. Corn. Milk. Strawberries. Calculate how many servings you have of each food: 2 servings rice. 1 serving corn. 1 serving milk. 1 serving strawberries. Multiply each number of servings by 15 g: 2 servings rice x 15 g = 30 g. 1 serving corn x 15 g = 15 g. 1 serving milk x 15 g = 15 g. 1 serving strawberries x 15 g = 15 g. Add together all of the amounts to find the total grams of carbohydrates eaten: 30 g + 15 g + 15 g + 15 g = 75 g of carbohydrates total. What are tips for following this plan? Shopping Develop a meal plan and then make a shopping list. Buy fresh and frozen vegetables, fresh and frozen fruit, dairy, eggs, beans, lentils, and whole grains. Look at food labels. Choose foods that have more fiber and less sugar. Avoid processed foods and foods with added sugars. Meal planning Aim to have the same number of grams of carbohydrates at each meal and for each snack time. Plan to have regular, balanced meals and snacks. Where to find more information American Diabetes Association: diabetes.org Centers for Disease Control and Prevention: TonerPromos.no Academy of Nutrition and Dietetics: eatright.org Association of Diabetes Care & Education Specialists: diabeteseducator.org Summary Carbohydrate counting is a method of keeping track of how many carbohydrates you eat. Eating carbohydrates increases the amount of sugar (glucose) in your blood. Counting how many carbohydrates you eat improves how well you manage your blood glucose. This helps you manage your diabetes. A dietitian can help you make a meal plan and calculate how many carbohydrates you should have at each meal and snack. This information is not  intended to replace advice given to you by your health care provider. Make sure you discuss any questions you have with your health care provider. Document Revised: 07/27/2019 Document Reviewed: 07/28/2019 Elsevier Patient Education  2024 [employer].

## 2023-06-25 NOTE — Patient Outreach (Addendum)
 Complex Care Management   Visit Note  06/25/2023  Name:  Lisa Mann MRN: [id number] DOB: Dec 21, 1933  Situation: Referral received for Complex Care Management related to SDOH Barriers:  resources I obtained verbal consent from Patient.  Visit completed with patient  on the phone  Background:   Past Medical History:  Diagnosis Date   Breast cancer, right breast (HCC) 2010   Cancer (HCC) 2008 and 2010   , first was left then right   Cancer of breast, intraductal 2008    left ( treated surgically and with radiation)   Diabetes mellitus, type 2 (HCC)    controlled    DJD (degenerative joint disease)    Fracture of pelvis    Fracture of wrist    left    Fracture, ribs    GERD (gastroesophageal reflux disease)    Gout    History of hiatal hernia    Hyperlipidemia    Hypertension    Low back pain    Lumbar radiculopathy    Obesity     Assessment: Patient Reported Symptoms:  Cognitive Cognitive Status: Alert and oriented to person, place, and time, Normal speech and language skills, Insightful and able to interpret abstract concepts Cognitive/Intellectual Conditions Management [RPT]: None reported or documented in medical history or problem list   Health Maintenance Behaviors: Annual physical exam, Exercise, Spiritual practice(s) Healing Pattern: Average Health Facilitated by: Prayer/meditation, Rest  Neurological Neurological Review of Symptoms: No symptoms reported Neurological Self-Management Outcome: 4 (good)  HEENT HEENT Symptoms Reported: Frequent sneezing HEENT Management Strategies: Medication therapy, Routine screening    Cardiovascular Cardiovascular Symptoms Reported: No symptoms reported Does patient have uncontrolled Hypertension?: No Cardiovascular Conditions: Hypertension, High blood cholesterol Cardiovascular Management Strategies: Medication therapy  Respiratory Respiratory Symptoms Reported: No symptoms reported Respiratory Self-Management Outcome: 4  (good)  Endocrine Patient reports the following symptoms related to hypoglycemia or hyperglycemia : No symptoms reported Is patient diabetic?: Yes Is patient checking blood sugars at home?: Yes Endocrine Conditions: Diabetes, Thyroid  disorder Endocrine Self-Management Outcome: 4 (good)  Gastrointestinal Gastrointestinal Symptoms Reported: No symptoms reported Gastrointestinal Conditions: Constipation Gastrointestinal Management Strategies: Medication therapy Gastrointestinal Self-Management Outcome: 4 (good) Nutrition Risk Screen (CP): No indicators present  Genitourinary Genitourinary Symptoms Reported: No symptoms reported    Integumentary Integumentary Symptoms Reported: No symptoms reported    Musculoskeletal Musculoskelatal Symptoms Reviewed: No symptoms reported   Falls in the past year?: Yes Number of falls in past year: 2 or more Was there an injury with Fall?: Yes Fall Risk Category Calculator: 3 Patient Fall Risk Level: High Fall Risk Patient at Risk for Falls Due to: History of fall(s) Fall risk Follow up: Education provided, Falls prevention discussed  Psychosocial Psychosocial Symptoms Reported: No symptoms reported   Major Change/Loss/Stressor/Fears (CP): Denies Quality of Family Relationships: helpful, involved, supportive      06/25/2023    2:19 PM  Depression screen PHQ 2/9  Decreased Interest 0  Down, Depressed, Hopeless 0  PHQ - 2 Score 0    There were no vitals filed for this visit.  Medications Reviewed Today     Reviewed by Remona Carmel, RN (Registered Nurse) on 06/25/23 at 1416  Med List Status: <None>   Medication Order Taking? Sig Documenting Provider Last Dose Status Informant  allopurinol  (ZYLOPRIM ) 100 MG tablet [id number] Yes TAKE 1 TABLET(100 MG) BY MOUTH DAILY Towanda Fret, MD  Active   aspirin  81 MG EC tablet [id number] Yes Take 1 tablet (81 mg total) by mouth daily.  Swallow whole. Towanda Fret, MD  Active Self   atorvastatin  (LIPITOR) 10 MG tablet [id number] Yes TAKE 1 TABLET(10 MG) BY MOUTH DAILY Towanda Fret, MD  Active   azelastine  (ASTELIN ) 0.1 % nasal spray [id number] Yes Place 1 spray into both nostrils 2 (two) times daily. Use in each nostril as directed  Patient taking differently: Place 1 spray into both nostrils as needed. Use in each nostril as directed   Corbin Dess, PA-C  Active            Med Note (SUTTON, CRYSTAL L   Thu May 01, 2023  2:14 PM) As needed.  Calcium  Carbonate-Vit D-Min (CALTRATE 600+D PLUS MINERALS) 600-800 MG-UNIT CHEW [id number] Yes Take one twice daily Towanda Fret, MD  Active Self  Cholecalciferol  (VITAMIN D3) 25 MCG (1000 UT) CAPS [id number] Yes Take 1 capsule (1,000 Units total) by mouth daily. Towanda Fret, MD  Active Self  cyanocobalamin  (VITAMIN B12) 500 MCG tablet [id number] Yes Take 1 tablet (500 mcg total) by mouth daily. Towanda Fret, MD  Active Self  gabapentin  (NEURONTIN ) 100 MG capsule [id number] Yes Take 1 capsule (100 mg total) by mouth at bedtime.  Patient taking differently: Take 100 mg by mouth as needed.   Towanda Fret, MD  Active            Med Note Ethyl Hering, CRYSTAL L   Thu May 01, 2023  2:15 PM) prn  glucose blood (ACCU-CHEK GUIDE) test strip [id number] Yes USE TO TEST BLOOD SUGAR ONCE DAILY Towanda Fret, MD  Active   guaiFENesin  (MUCINEX ) 600 MG 12 hr tablet [id number] Yes Take 1 tablet (600 mg total) by mouth 2 (two) times daily as needed. Nasario Badder  Active   Lancet Device MISC [id number] Yes Softclick lancet device Towanda Fret, MD  Active   Lancets 30G MISC [id number] Yes Once daily testing. Uses accucheck Guide DX E11.65 Towanda Fret, MD  Active   levothyroxine  (SYNTHROID ) 75 MCG tablet [id number] Yes Take 1 tablet (75 mcg total) by mouth daily before breakfast. Nida, Gebreselassie W, MD  Active   metFORMIN  (GLUCOPHAGE ) 1000 MG tablet [id number] Yes TAKE 1 TABLET(1000 MG)  BY MOUTH TWICE DAILY WITH A MEAL Towanda Fret, MD  Active   montelukast  (SINGULAIR ) 10 MG tablet [id number] Yes Take 1 tablet (10 mg total) by mouth at bedtime. Towanda Fret, MD  Active   potassium chloride  (KLOR-CON ) 10 MEQ tablet [id number] Yes TAKE 1 TABLET(10 MEQ) BY MOUTH DAILY Towanda Fret, MD  Active   ramipril  (ALTACE ) 10 MG capsule [id number] Yes TAKE 1 CAPSULE(10 MG) BY MOUTH DAILY Towanda Fret, MD  Active   traMADol  (ULTRAM ) 50 MG tablet [id number] Yes Take one tablet by mouth every 8 to 12 hours as needed, for severe pain Towanda Fret, MD  Active   triamterene -hydrochlorothiazide  (MAXZIDE ) 75-50 MG tablet [id number] Yes TAKE 1 TABLET BY MOUTH DAILY Towanda Fret, MD  Active   UNABLE TO FIND [id number] Yes Diabetic shoes and inserts x 3  DX E11.9 Towanda Fret, MD  Active             Recommendation:   Continue Current Plan of Care  Follow Up Plan:   Closing From:  Complex Care Management  Grandville Lax, BSN RN [facility]  [facility], [facility] RN Care Manager Direct Dial: [phone]  Fax: [phone]

## 2023-06-26 ENCOUNTER — Encounter (HOSPITAL_COMMUNITY)

## 2023-07-01 ENCOUNTER — Encounter (HOSPITAL_COMMUNITY)

## 2023-07-03 ENCOUNTER — Encounter (HOSPITAL_COMMUNITY)

## 2023-07-04 ENCOUNTER — Other Ambulatory Visit: Payer: Self-pay | Admitting: Family Medicine

## 2023-07-04 DIAGNOSIS — E785 Hyperlipidemia, unspecified: Secondary | ICD-10-CM

## 2023-07-04 DIAGNOSIS — E1169 Type 2 diabetes mellitus with other specified complication: Secondary | ICD-10-CM

## 2023-07-04 DIAGNOSIS — I10 Essential (primary) hypertension: Secondary | ICD-10-CM

## 2023-07-04 DIAGNOSIS — Z23 Encounter for immunization: Secondary | ICD-10-CM

## 2023-07-07 ENCOUNTER — Encounter: Payer: Self-pay | Admitting: Family Medicine

## 2023-07-08 ENCOUNTER — Telehealth: Admitting: *Deleted

## 2023-07-15 ENCOUNTER — Other Ambulatory Visit: Payer: Self-pay | Admitting: Family Medicine

## 2023-07-16 ENCOUNTER — Other Ambulatory Visit: Payer: Self-pay | Admitting: *Deleted

## 2023-07-16 NOTE — Patient Instructions (Signed)
 Visit Information  Thank you for taking time to visit with me today. Please don't hesitate to contact me if I can be of assistance to you before our next scheduled appointment.  Your next care management appointment is by telephone on 08-13-2023 at 9:30 am  Telephone follow-up in 1 month  Please call the care guide team at [phone] if you need to cancel, schedule, or reschedule an appointment.   Please call the Suicide and Crisis Lifeline: 988 call the [place]  National Suicide Prevention Lifeline: 414 379 9430 or TTY: 305-587-5354 TTY (872)769-1970) to talk to a trained counselor call 1-800-273-TALK (toll free, 24 hour hotline) call the [facility]: (343)045-4388 call 911 if you are experiencing a Mental Health or Behavioral Health Crisis or need someone to talk to.  Rosina Forte, BSN RN [facility], [facility] RN Care Manager Direct Dial: [phone]  Fax: [phone]

## 2023-07-16 NOTE — Patient Outreach (Signed)
 Complex Care Management   Visit Note  07/16/2023  Name:  Lisa Mann MRN: [id number] DOB: [birth year]  Situation: Referral received for Complex Care Management related to Diabetes with Complications I obtained verbal consent from Patient.  Visit completed with patient  on the phone  Background:   Past Medical History:  Diagnosis Date   Breast cancer, right breast (HCC) 2010   Cancer (HCC) 2008 and 2010   , first was left then right   Cancer of breast, intraductal 2008    left ( treated surgically and with radiation)   Diabetes mellitus, type 2 (HCC)    controlled    DJD (degenerative joint disease)    Fracture of pelvis    Fracture of wrist    left    Fracture, ribs    GERD (gastroesophageal reflux disease)    Gout    History of hiatal hernia    Hyperlipidemia    Hypertension    Low back pain    Lumbar radiculopathy    Obesity     Assessment: Patient Reported Symptoms:  Cognitive Cognitive Status: No symptoms reported Cognitive/Intellectual Conditions Management [RPT]: None reported or documented in medical history or problem list   Health Maintenance Behaviors: Annual physical exam Healing Pattern: Average Health Facilitated by: Rest  Neurological Neurological Review of Symptoms: No symptoms reported Neurological Self-Management Outcome: 4 (good)  HEENT HEENT Symptoms Reported: No symptoms reported HEENT Management Strategies: Routine screening HEENT Self-Management Outcome: 4 (good)    Cardiovascular Cardiovascular Symptoms Reported: No symptoms reported Does patient have uncontrolled Hypertension?: No Cardiovascular Management Strategies: Medication therapy, Routine screening Cardiovascular Self-Management Outcome: 4 (good)  Respiratory Respiratory Symptoms Reported: No symptoms reported Respiratory Management Strategies: Routine screening Respiratory Self-Management Outcome: 4 (good)  Endocrine Endocrine Symptoms Reported: No symptoms reported Is patient  diabetic?: Yes Is patient checking blood sugars at home?: Yes List most recent blood sugar readings, include date and time of day: 07-16-2023 at 0708 111 Endocrine Self-Management Outcome: 4 (good)  Gastrointestinal Gastrointestinal Symptoms Reported: No symptoms reported Gastrointestinal Management Strategies: Medication therapy Gastrointestinal Self-Management Outcome: 4 (good) Nutrition Risk Screen (CP): No indicators present  Genitourinary Genitourinary Symptoms Reported: No symptoms reported Genitourinary Self-Management Outcome: 4 (good)  Integumentary Integumentary Symptoms Reported: No symptoms reported Skin Self-Management Outcome: 4 (good)  Musculoskeletal Musculoskelatal Symptoms Reviewed: Unsteady gait Musculoskeletal Management Strategies: Routine screening Musculoskeletal Self-Management Outcome: 4 (good) Falls in the past year?: Yes Number of falls in past year: 2 or more Was there an injury with Fall?: Yes Fall Risk Category Calculator: 3 Patient Fall Risk Level: High Fall Risk Patient at Risk for Falls Due to: History of fall(s), Impaired balance/gait Fall risk Follow up: Falls evaluation completed, Falls prevention discussed, Education provided  Psychosocial Psychosocial Symptoms Reported: No symptoms reported Behavioral Management Strategies: Support system Behavioral Health Self-Management Outcome: 4 (good) Major Change/Loss/Stressor/Fears (CP): Denies Techniques to Cope with Loss/Stress/Change: Not applicable Quality of Family Relationships: helpful, involved, supportive Do you feel physically threatened by others?: No      07/16/2023    9:36 AM  Depression screen PHQ 2/9  Decreased Interest 0  Down, Depressed, Hopeless 0  PHQ - 2 Score 0    There were no vitals filed for this visit.  Medications Reviewed Today     Reviewed by Bertrum Rosina CHRISTELLA, RN (Registered Nurse) on 07/16/23 at [phone]  Med List Status: <None>   Medication Order Taking? Sig Documenting  Provider Last Dose Status Informant  allopurinol  (ZYLOPRIM ) 100 MG tablet [id number] Yes  TAKE 1 TABLET(100 MG) BY MOUTH DAILY Antonetta Rollene BRAVO, MD  Active   aspirin  81 MG EC tablet [id number] Yes Take 1 tablet (81 mg total) by mouth daily. Swallow whole. Antonetta Rollene BRAVO, MD  Active Self  atorvastatin  (LIPITOR) 10 MG tablet [id number] Yes TAKE 1 TABLET(10 MG) BY MOUTH DAILY Antonetta Rollene BRAVO, MD  Active   azelastine  (ASTELIN ) 0.1 % nasal spray [id number] Yes Place 1 spray into both nostrils 2 (two) times daily. Use in each nostril as directed  Patient taking differently: Place 1 spray into both nostrils as needed. Use in each nostril as directed   Stuart Vernell Norris, PA-C  Active            Med Note (SUTTON, CRYSTAL L   Thu May 01, 2023  2:14 PM) As needed.  Calcium  Carbonate-Vit D-Min (CALTRATE 600+D PLUS MINERALS) 600-800 MG-UNIT CHEW [id number] Yes Take one twice daily Antonetta Rollene BRAVO, MD  Active Self  Cholecalciferol  (VITAMIN D3) 25 MCG (1000 UT) CAPS [id number] Yes Take 1 capsule (1,000 Units total) by mouth daily. Antonetta Rollene BRAVO, MD  Active Self  cyanocobalamin  (VITAMIN B12) 500 MCG tablet [id number] Yes Take 1 tablet (500 mcg total) by mouth daily. Antonetta Rollene BRAVO, MD  Active Self  gabapentin  (NEURONTIN ) 100 MG capsule [id number] Yes Take 1 capsule (100 mg total) by mouth at bedtime.  Patient taking differently: Take 100 mg by mouth as needed.   Antonetta Rollene BRAVO, MD  Active            Med Note PATTRICIA, CRYSTAL L   Thu May 01, 2023  2:15 PM) prn  glucose blood (ACCU-CHEK GUIDE) test strip [id number] Yes USE TO TEST BLOOD SUGAR ONCE DAILY Antonetta Rollene BRAVO, MD  Active   guaiFENesin  (MUCINEX ) 600 MG 12 hr tablet [id number] Yes Take 1 tablet (600 mg total) by mouth 2 (two) times daily as needed. Stuart Vernell Norris DEVONNA  Active   Lancet Device MISC [id number] Yes Softclick lancet device Antonetta Rollene BRAVO, MD  Active   Lancets 30G MISC [id number] Yes Once daily testing.  Uses accucheck Guide DX E11.65 Antonetta Rollene BRAVO, MD  Active   levothyroxine  (SYNTHROID ) 75 MCG tablet [id number] Yes Take 1 tablet (75 mcg total) by mouth daily before breakfast. Nida, Gebreselassie W, MD  Active   metFORMIN  (GLUCOPHAGE ) 1000 MG tablet [id number] Yes TAKE 1 TABLET(1000 MG) BY MOUTH TWICE DAILY WITH A MEAL Antonetta Rollene BRAVO, MD  Active   montelukast  (SINGULAIR ) 10 MG tablet [id number] Yes TAKE 1 TABLET(10 MG) BY MOUTH AT BEDTIME Antonetta Rollene BRAVO, MD  Active   potassium chloride  (KLOR-CON ) 10 MEQ tablet [id number] Yes TAKE 1 TABLET(10 MEQ) BY MOUTH DAILY Antonetta Rollene BRAVO, MD  Active   ramipril  (ALTACE ) 10 MG capsule [id number] Yes TAKE 1 CAPSULE(10 MG) BY MOUTH DAILY Antonetta Rollene BRAVO, MD  Active   traMADol  (ULTRAM ) 50 MG tablet [id number] Yes Take one tablet by mouth every 8 to 12 hours as needed, for severe pain Antonetta Rollene BRAVO, MD  Active   triamterene -hydrochlorothiazide  (MAXZIDE ) 75-50 MG tablet [id number] Yes TAKE 1 TABLET BY MOUTH DAILY Antonetta Rollene BRAVO, MD  Active   UNABLE TO FIND [id number] Yes Diabetic shoes and inserts x 3  DX E11.9 Antonetta Rollene BRAVO, MD  Active             Recommendation:   Continue Current Plan of Care  Follow Up Plan:   Telephone follow up appointment date/time:  08-13-2023 at 9:30 am  Rosina Forte, BSN RN [facility], [facility] RN Care Manager Direct Dial: [phone]  Fax: [phone]

## 2023-07-17 DIAGNOSIS — M79671 Pain in right foot: Secondary | ICD-10-CM | POA: Diagnosis not present

## 2023-07-17 DIAGNOSIS — E1151 Type 2 diabetes mellitus with diabetic peripheral angiopathy without gangrene: Secondary | ICD-10-CM | POA: Diagnosis not present

## 2023-07-17 DIAGNOSIS — M79672 Pain in left foot: Secondary | ICD-10-CM | POA: Diagnosis not present

## 2023-07-17 DIAGNOSIS — E1140 Type 2 diabetes mellitus with diabetic neuropathy, unspecified: Secondary | ICD-10-CM | POA: Diagnosis not present

## 2023-07-17 DIAGNOSIS — I739 Peripheral vascular disease, unspecified: Secondary | ICD-10-CM | POA: Diagnosis not present

## 2023-07-17 DIAGNOSIS — L110 Acquired keratosis follicularis: Secondary | ICD-10-CM | POA: Diagnosis not present

## 2023-07-24 ENCOUNTER — Ambulatory Visit: Admitting: Family Medicine

## 2023-07-24 ENCOUNTER — Encounter: Payer: Self-pay | Admitting: Family Medicine

## 2023-07-24 VITALS — BP 122/67 | HR 90 | Resp 18 | Ht 63.0 in | Wt 173.1 lb

## 2023-07-24 DIAGNOSIS — E1140 Type 2 diabetes mellitus with diabetic neuropathy, unspecified: Secondary | ICD-10-CM

## 2023-07-24 DIAGNOSIS — M109 Gout, unspecified: Secondary | ICD-10-CM

## 2023-07-24 DIAGNOSIS — K219 Gastro-esophageal reflux disease without esophagitis: Secondary | ICD-10-CM | POA: Diagnosis not present

## 2023-07-24 DIAGNOSIS — E785 Hyperlipidemia, unspecified: Secondary | ICD-10-CM | POA: Diagnosis not present

## 2023-07-24 DIAGNOSIS — E559 Vitamin D deficiency, unspecified: Secondary | ICD-10-CM

## 2023-07-24 NOTE — Assessment & Plan Note (Signed)
 Diabetes associated with hypertension, arthritis, and neurological disease  Lisa Mann is reminded of the importance of commitment to daily physical activity for 30 minutes or more, as able and the need to limit carbohydrate intake to 30 to 60 grams per meal to help with blood sugar control.   The need to take medication as prescribed, test blood sugar as directed, and to call between visits if there is a concern that blood sugar is uncontrolled is also discussed.   Lisa Mann is reminded of the importance of daily foot exam, annual eye examination, and good blood sugar, blood pressure and cholesterol control.     Latest Ref Rng & Units 03/20/2023    8:59 AM 10/23/2022    9:34 AM 10/10/2022    8:15 AM 06/18/2022    8:14 AM 01/23/2022    2:57 PM  Diabetic Labs  HbA1c 4.8 - 5.6 % 6.8  6.9   6.9  6.9   Micro/Creat Ratio 0 - 29 mg/g creat   3     Chol 100 - 199 mg/dL 834    845  820   HDL >60 mg/dL 59    62  54   Calc LDL 0 - 99 mg/dL 84    77  896   Triglycerides 0 - 149 mg/dL 877    79  873   Creatinine 0.57 - 1.00 mg/dL 9.08  9.16   9.17  9.23       07/24/2023    8:04 AM 05/28/2023   12:41 PM 05/01/2023    2:13 PM 04/17/2023    9:35 AM 03/27/2023    3:38 PM 03/20/2023    8:09 AM 03/10/2023    6:43 PM  BP/Weight  Systolic BP 122 -- 123 138 143 124 124  Diastolic BP 67 -- 65 78 67 69 64  Wt. (Lbs) 173.12 172 172.6 172  175   BMI 30.67 kg/m2 30.47 kg/m2 30.57 kg/m2 30.47 kg/m2  31 kg/m2       Latest Ref Rng & Units 10/23/2022    8:00 AM 05/16/2022   11:56 AM  Foot/eye exam completion dates  Eye Exam No Retinopathy  No Retinopathy      Foot Form Completion  Done      This result is from an external source.     Updated lab needed at/ before next visit.

## 2023-07-24 NOTE — Assessment & Plan Note (Signed)
 Updated lab needed at/ before next visit.

## 2023-07-24 NOTE — Assessment & Plan Note (Signed)
No recent flare, check uric acid level 

## 2023-07-24 NOTE — Patient Instructions (Addendum)
 Annual exam early November, call if you need me sooner.  Please schedule DEXA at checkout.  Nurse pls send for diabetic eye exam from Dr Octavia 9 has see him this year)  Labs needed CBC  CMP and EGFR HbA1c  and vitamin D  level.and uric acid level.  Please reconsider surgical treatment for glaucoma.let family go with you to next eye appoint,ment    Careful not to fall  Keep up the great work!!  HAPPY 63 th BIRTHDAY when it comes  Thanks for choosing [facility], we consider it a privelige to serve you.

## 2023-07-24 NOTE — Assessment & Plan Note (Signed)
 Hyperlipidemia:Low fat diet discussed and encouraged.   Lipid Panel  Lab Results  Component Value Date   CHOL 165 03/20/2023   HDL 59 03/20/2023   LDLCALC 84 03/20/2023   TRIG 122 03/20/2023   CHOLHDL 2.5 06/18/2022     Controlled, no change in medication

## 2023-07-24 NOTE — Progress Notes (Signed)
 Lisa Mann     MRN: [id number]      DOB: Jun 05, 1933  Chief Complaint  Patient presents with   Diabetes    4 month follow up     HPI Lisa Mann is here for follow up and re-evaluation of chronic medical conditions, medication management and review of any available recent lab and radiology data.  Preventive health is updated, specifically  Cancer screening and Immunization.   Ophthalmology recommending surgical treatment for glaucoma, she is hesitant but I have encouraged her to re think that approach and get family members involved. The PT denies any adverse reactions to current medications since the last visit.  There are no new concerns.  There are no specific complaints   ROS Denies recent fever or chills. Denies sinus pressure, nasal congestion, ear pain or sore throat. Denies chest congestion, productive cough or wheezing. Denies chest pains, palpitations and leg swelling Denies abdominal pain, nausea, vomiting,diarrhea or constipation.   Denies dysuria, frequency, hesitancy or incontinence. Denies uncontrolled  joint pain, swelling and limitation in mobility.Reports imbalance  Denies headaches, seizures, numbness, or tingling. Denies depression, anxiety or insomnia. Denies skin break down or rash.   PE  BP 122/67   Pulse 90   Resp 18   Ht 5' 3 (1.6 m)   Wt 173 lb 1.9 oz (78.5 kg)   SpO2 92%   BMI 30.67 kg/m   Patient alert and oriented and in no cardiopulmonary distress.  HEENT: No facial asymmetry, EOMI,     Neck supple .  Chest: Clear to auscultation bilaterally.  CVS: S1, S2 no murmurs, no S3.Regular rate.  ABD: Soft non tender.   Ext: No edema  MS: decreased ROM spine, shoulders, hips and knees.  Skin: Intact, no ulcerations or rash noted.  Psych: Good eye contact, normal affect. Memory intact not anxious or depressed appearing.  CNS: CN 2-12 intact, power,  normal throughout.no focal deficits noted.   Assessment & Plan  Vitamin D   deficiency Updated lab needed at/ before next visit.   Diabetes mellitus with neuropathy Diabetes associated with hypertension, arthritis, and neurological disease  [name] is reminded of the importance of commitment to daily physical activity for 30 minutes or more, as able and the need to limit carbohydrate intake to 30 to 60 grams per meal to help with blood sugar control.   The need to take medication as prescribed, test blood sugar as directed, and to call between visits if there is a concern that blood sugar is uncontrolled is also discussed.   [name] is reminded of the importance of daily foot exam, annual eye examination, and good blood sugar, blood pressure and cholesterol control.     Latest Ref Rng & Units 03/20/2023    8:59 AM 10/23/2022    9:34 AM 10/10/2022    8:15 AM 06/18/2022    8:14 AM 01/23/2022    2:57 PM  Diabetic Labs  HbA1c 4.8 - 5.6 % 6.8  6.9   6.9  6.9   Micro/Creat Ratio 0 - 29 mg/g creat   3     Chol 100 - 199 mg/dL 834    845  820   HDL >60 mg/dL 59    62  54   Calc LDL 0 - 99 mg/dL 84    77  896   Triglycerides 0 - 149 mg/dL 877    79  873   Creatinine 0.57 - 1.00 mg/dL 9.08  9.16   9.17  0.76       07/24/2023    8:04 AM 05/28/2023   12:41 PM 05/01/2023    2:13 PM 04/17/2023    9:35 AM 03/27/2023    3:38 PM 03/20/2023    8:09 AM 03/10/2023    6:43 PM  BP/Weight  Systolic BP 122 -- 123 138 143 124 124  Diastolic BP 67 -- 65 78 67 69 64  Wt. (Lbs) 173.12 172 172.6 172  175   BMI 30.67 kg/m2 30.47 kg/m2 30.57 kg/m2 30.47 kg/m2  31 kg/m2       Latest Ref Rng & Units 10/23/2022    8:00 AM 05/16/2022   11:56 AM  Foot/eye exam completion dates  Eye Exam No Retinopathy  No Retinopathy      Foot Form Completion  Done      This result is from an external source.     Updated lab needed at/ before next visit.    GERD Controlled, no change in medication   Gout No recent flare , check uric acid level  Hyperlipidemia with target LDL less than  100 Hyperlipidemia:Low fat diet discussed and encouraged.   Lipid Panel  Lab Results  Component Value Date   CHOL 165 03/20/2023   HDL 59 03/20/2023   LDLCALC 84 03/20/2023   TRIG 122 03/20/2023   CHOLHDL 2.5 06/18/2022     Controlled, no change in medication

## 2023-07-24 NOTE — Assessment & Plan Note (Signed)
 Controlled, no change in medication

## 2023-07-25 ENCOUNTER — Encounter: Payer: Self-pay | Admitting: Family Medicine

## 2023-07-25 ENCOUNTER — Ambulatory Visit: Payer: Self-pay | Admitting: Family Medicine

## 2023-07-25 ENCOUNTER — Ambulatory Visit: Payer: Self-pay

## 2023-07-25 ENCOUNTER — Telehealth: Payer: Self-pay | Admitting: Family Medicine

## 2023-07-25 LAB — CBC WITH DIFFERENTIAL/PLATELET
Basophils Absolute: 0 x10E3/uL (ref 0.0–0.2)
Basos: 1 %
EOS (ABSOLUTE): 0.1 x10E3/uL (ref 0.0–0.4)
Eos: 2 %
Hematocrit: 37.6 % (ref 34.0–46.6)
Hemoglobin: 12.4 g/dL (ref 11.1–15.9)
Immature Grans (Abs): 0 x10E3/uL (ref 0.0–0.1)
Immature Granulocytes: 0 %
Lymphocytes Absolute: 0.8 x10E3/uL (ref 0.7–3.1)
Lymphs: 13 %
MCH: 29.7 pg (ref 26.6–33.0)
MCHC: 33 g/dL (ref 31.5–35.7)
MCV: 90 fL (ref 79–97)
Monocytes Absolute: 0.7 x10E3/uL (ref 0.1–0.9)
Monocytes: 11 %
Neutrophils Absolute: 4.6 x10E3/uL (ref 1.4–7.0)
Neutrophils: 73 %
Platelets: 320 x10E3/uL (ref 150–450)
RBC: 4.17 x10E6/uL (ref 3.77–5.28)
RDW: 13.6 % (ref 11.7–15.4)
WBC: 6.2 x10E3/uL (ref 3.4–10.8)

## 2023-07-25 LAB — CMP14+EGFR
ALT: 14 IU/L (ref 0–32)
AST: 18 IU/L (ref 0–40)
Albumin: 4.5 g/dL (ref 3.7–4.7)
Alkaline Phosphatase: 47 IU/L (ref 44–121)
BUN/Creatinine Ratio: 19 (ref 12–28)
BUN: 16 mg/dL (ref 8–27)
Bilirubin Total: 0.2 mg/dL (ref 0.0–1.2)
CO2: 23 mmol/L (ref 20–29)
Calcium: 10.4 mg/dL — ABNORMAL HIGH (ref 8.7–10.3)
Chloride: 98 mmol/L (ref 96–106)
Creatinine, Ser: 0.86 mg/dL (ref 0.57–1.00)
Globulin, Total: 2.3 g/dL (ref 1.5–4.5)
Glucose: 112 mg/dL — ABNORMAL HIGH (ref 70–99)
Potassium: 4.6 mmol/L (ref 3.5–5.2)
Sodium: 140 mmol/L (ref 134–144)
Total Protein: 6.8 g/dL (ref 6.0–8.5)
eGFR: 65 mL/min/1.73 (ref >59)

## 2023-07-25 LAB — VITAMIN D 25 HYDROXY (VIT D DEFICIENCY, FRACTURES): Vit D, 25-Hydroxy: 41.4 ng/mL (ref 30.0–100.0)

## 2023-07-25 LAB — HEMOGLOBIN A1C
Est. average glucose Bld gHb Est-mCnc: 137 mg/dL
Hgb A1c MFr Bld: 6.4 % — ABNORMAL HIGH (ref 4.8–5.6)

## 2023-07-25 LAB — URIC ACID: Uric Acid: 5.1 mg/dL (ref 3.1–7.9)

## 2023-07-25 MED ORDER — NIRMATRELVIR/RITONAVIR (PAXLOVID)TABLET
3.0000 | ORAL_TABLET | Freq: Two times a day (BID) | ORAL | 0 refills | Status: AC
Start: 2023-07-25 — End: 2023-07-30

## 2023-07-25 NOTE — Telephone Encounter (Signed)
 I have prescribed Paxlovid 3 tablets twice daily for 5 days.  Patient is to take Tylenol  extra strength 1 up to 3 times daily if needed for pain fever and bodyaches.  Please encourage her to drink fluids so she stays hydrated.  If her symptoms worsen as in  fever that will not break , or she develop shortness of breath she should go to the emergency room.    Please let Lisa Mann know that I am sorry that she is acutely ill , and I  hope that  she will respond well to  treatment and that  she will feel better soon.

## 2023-07-25 NOTE — Telephone Encounter (Signed)
 Pt daughter informed

## 2023-07-25 NOTE — Telephone Encounter (Signed)
 FYI Only or Action Required?: Action required by provider: refusing ED at this time, requesting paxlovid and further recommendations.  Patient was last seen in primary care on 07/24/2023 by Antonetta Rollene BRAVO, MD.  Called Nurse Triage reporting Fever, Fatigue, Generalized Body Aches, clammy skin, Weakness, Extremity Weakness, not eating/drinking well, Chills, productive cough, Headache, Nausea, Nasal Congestion, and Covid Positive.  Symptoms began several days ago.  Interventions attempted: OTC medications: mucinex  and Rest, hydration, or home remedies.  Symptoms are: gradually worsening.  Triage Disposition: Go to ED Now (or PCP Triage)  Patient/caregiver understands and will follow disposition?: No, refuses disposition       Message from [place] R sent at 07/25/2023  9:02 AM EDT  Summary: Positive COVID   Patient's daughter Darice called and spoke to an on call nurse last night, patient tested positive for Covid last night. Has a lot of congestion, clammy skin, fever last night. Has no eaten or drank anything yet today. Was advised to contact the office today for further instruction.  Darice can be reached at [phone]          Reason for Disposition  Patient sounds very sick or weak to the triager  Answer Assessment - Initial Assessment Questions 2. SEVERITY: How bad is it?  Can you stand and walk?     Can stand and walk, at home doesn't usually use the cane but having to use it 3. ONSET: When did these symptoms begin? (e.g., hours, days, weeks, months)     Congestion noticed it more yesterday Ate a little something then got into bed, daughter gave her mucinex  and tylenol , fever, had to lay back down, legs real weak, stands up, has to stand right there for little bit before take a step because legs is weak 4. CAUSE: What do you think is causing the weakness or fatigue? (e.g., not drinking enough fluids, medical problem, trouble sleeping)     Positive for covid  last night Not been eating or drinking since last night 6. OTHER SYMPTOMS: Do you have any other symptoms? (e.g., chest pain, fever, cough, SOB, vomiting, diarrhea, bleeding, other areas of pain)     Unsure temp, couldn't find thermometer but very clammy, chills, real cold even in bathrobe with 2 comforters, but not shivering Didn't seem to have chest pain or SOB Coughing a lot Mucus part of it is a lot in nose and coughing things up, not sure of the color Head has been hurting, don't think been throwing up but been nauseous Even when sick she'll get up as scheduled, for her to not have strength to get up, sign of being sick Shakiness of the legs Covid the first time think attacked her nerves, thought stroke, needed surgery to get rods in head, can't fall and hit her head, has fallen since then and rods shifted and wanted to go back and fix it but pt refused because of her age Want to keep her in house  Requesting paxlovid Has had symptoms for over 48 hours   Advised pt go to ED with severity of weakness, pt hx, clamminess, and nausea, especially if any worsening or new symptoms like chest pain or SOB. Refusing ED at this time, requesting paxlovid. Sending message to PCP office for call back to daughter with further recommendations/appt options in meantime. Alerted CAL to ED refusal.  Protocols used: Weakness (Generalized) and Fatigue-A-AH

## 2023-07-25 NOTE — Telephone Encounter (Signed)
 Patient daughter called after hours left a voicemail with call nurse, patient is now positive for covid , very congested fever , feels clammy and having ache in her legs. Has not taken no medicine. Can patient get a call back on what to to next. [phone].

## 2023-07-28 ENCOUNTER — Inpatient Hospital Stay (HOSPITAL_COMMUNITY): Admission: RE | Admit: 2023-07-28 | Source: Ambulatory Visit

## 2023-08-04 ENCOUNTER — Ambulatory Visit (INDEPENDENT_AMBULATORY_CARE_PROVIDER_SITE_OTHER): Admitting: Gastroenterology

## 2023-08-05 ENCOUNTER — Encounter: Payer: Self-pay | Admitting: Family Medicine

## 2023-08-06 ENCOUNTER — Ambulatory Visit: Payer: Self-pay

## 2023-08-06 NOTE — Telephone Encounter (Signed)
 AGREE Needs urgent carfe or ED eval today with symptoms that are reported

## 2023-08-06 NOTE — Telephone Encounter (Signed)
 FYI Only or Action Required?: Action required by provider: Medication request.  Patient was last seen in primary care on 07/24/2023 by Antonetta Rollene BRAVO, MD.  Called Nurse Triage reporting Cough.  Symptoms began 2-3 days ago.  Symptoms are: gradually worsening.  Triage Disposition: See Physician Within 24 Hours  Patient/caregiver understands and will follow disposition?: No appointment, advised to go to urgent care    Copied from CRM #[id number]. Topic: Clinical - Red Word Triage >> Aug 06, 2023  3:11 PM Rachelle R wrote: [facility] that prompted transfer to Nurse Triage: Patient had Covid 2.5 weeks ago and was feeling better until about 2 days ago she started with upper respiratory symptoms. She has a productive cough, chills, and feeling nauseous.    Reason for Disposition  SEVERE coughing spells (e.g., whooping sound after coughing, vomiting after coughing)  Answer Assessment - Initial Assessment Questions Patient's daughter would like to know if Dr. Antonetta could prescribe the patient anything for her symptoms. No appointments in the office available until 8/12. Patient's daughter advised to take the patient to urgent care but I stated I would still request medication from the office as requested. Please advise.       1. ONSET: When did the cough begin?      2 days ago  2. SEVERITY: How bad is the cough today?      Moderate to severe  3. SPUTUM: Describe the color of your sputum (e.g., none, dry cough; clear, white, yellow, green)     Clear  4. HEMOPTYSIS: Are you coughing up any blood? If Yes, ask: How much? (e.g., flecks, streaks, tablespoons, etc.)     No 5. DIFFICULTY BREATHING: Are you having difficulty breathing? If Yes, ask: How bad is it? (e.g., mild, moderate, severe)      None reported to daughter  6. FEVER: Do you have a fever? If Yes, ask: What is your temperature, how was it measured, and when did it start?     Unsure  7. CARDIAC HISTORY: Do  you have any history of heart disease? (e.g., heart attack, congestive heart failure)      No 8. LUNG HISTORY: Do you have any history of lung disease?  (e.g., pulmonary embolus, asthma, emphysema)     No 9. PE RISK FACTORS: Do you have a history of blood clots? (or: recent major surgery, recent prolonged travel, bedridden)     No 10. OTHER SYMPTOMS: Do you have any other symptoms? (e.g., runny nose, wheezing, chest pain)       Chills, nausea  Protocols used: Cough - Acute Productive-A-AH

## 2023-08-08 ENCOUNTER — Ambulatory Visit (INDEPENDENT_AMBULATORY_CARE_PROVIDER_SITE_OTHER): Admitting: Family Medicine

## 2023-08-08 ENCOUNTER — Ambulatory Visit (HOSPITAL_COMMUNITY)
Admission: RE | Admit: 2023-08-08 | Discharge: 2023-08-08 | Disposition: A | Discharge status: Home / Self Care | Source: Ambulatory Visit | Attending: Family Medicine | Admitting: Family Medicine

## 2023-08-08 VITALS — BP 123/60 | HR 88 | Temp 97.8°F | Ht 63.0 in | Wt 172.2 lb

## 2023-08-08 DIAGNOSIS — R059 Cough, unspecified: Secondary | ICD-10-CM | POA: Diagnosis not present

## 2023-08-08 DIAGNOSIS — E1140 Type 2 diabetes mellitus with diabetic neuropathy, unspecified: Secondary | ICD-10-CM

## 2023-08-08 DIAGNOSIS — I10 Essential (primary) hypertension: Secondary | ICD-10-CM | POA: Diagnosis not present

## 2023-08-08 DIAGNOSIS — R051 Acute cough: Secondary | ICD-10-CM | POA: Insufficient documentation

## 2023-08-08 DIAGNOSIS — I700 Atherosclerosis of aorta: Secondary | ICD-10-CM | POA: Diagnosis not present

## 2023-08-08 MED ORDER — AZELASTINE HCL 0.1 % NA SOLN: 1.0000 | Freq: Two times a day (BID) | NASAL | 2 refills | Status: AC

## 2023-08-08 MED ORDER — PREDNISONE 5 MG PO TABS: 5.0000 mg | ORAL_TABLET | Freq: Two times a day (BID) | ORAL | 0 refills | Status: AC

## 2023-08-08 MED ORDER — CHLORPHENIRAMINE MALEATE 4 MG PO TABS: 4.0000 mg | ORAL_TABLET | Freq: Two times a day (BID) | ORAL | 0 refills | Status: AC | PRN

## 2023-08-08 NOTE — Patient Instructions (Addendum)
 F/u as before  Nurse please check and document temperature   CXR today  Need to take montelukast  every day and use nose spray astelin  twice daily.  Most of your cough now can be controlled by controlling your allergies  Prednisone  is prescribed for 5 days only , pls collect and start taking today  Chlorpheniramine  tablet take one daily for next 3 to 5 days , then only if needed , to reduce drainage and cough  Thanks for choosing [facility], we consider it a privelige to serve you.

## 2023-08-08 NOTE — Progress Notes (Signed)
     Lisa Mann     MRN: [id number]      DOB: 1933-03-19  Chief Complaint  Patient presents with   Cough    Got better from covid then developed cough    HPI Lisa Mann is here fwith above concern 1 week h/o increased cough, worse when she lies down,sputum when produced is clear No fevr but may have had chills earlier this week Notes post nasal drainage excessive  ROS Denies chest pains, palpitations and leg swelling Denies abdominal pain, nausea, vomiting,diarrhea or constipation.   Denies dysuria, frequency, hesitancy or incontinence. Denies joint pain, swelling and limitation in mobility. Denies headaches, seizures, numbness, or tingling. Denies depression, anxiety or insomnia. Denies skin break down or rash.   PE  BP 123/60   Pulse 88   Temp 97.8 F (36.6 C)   Ht 5' 3 (1.6 m)   Wt 172 lb 4 oz (78.1 kg)   SpO2 94%   BMI 30.51 kg/m   Patient alert and oriented and in no cardiopulmonary distress.  HEENT: No facial asymmetry, EOMI,     Neck supple .no sinus tenderness, however excess post nasal drainage and swelling of nasal mucosa noted  Chest: Clear to auscultation bilaterally.No wheezes or crackles  CVS: S1, S2 no murmurs, no S3.Regular rate.  ABD: Soft non tender.   Ext: No edema  MS: Adequate  though reduced ROM spine, shoulders, hips and knees.  Skin: Intact, no ulcerations or rash noted.  Psych: Good eye contact, normal affect. Memory intact not anxious or depressed appearing.  CNS: CN 2-12 intact, power,  normal throughout.no focal deficits noted.   Assessment & Plan  Acute cough Post covid cough c/w uncontrolled allergies, daily use of allergy meds astelin  and singulair , prednisone  x 5 days and clorpheniramine tab for 3 to 5 days then as needed. CXR  HTN, goal below 130/80 Controlled, no change in medication   Diabetes mellitus with neuropathy Controlled, no change in medication

## 2023-08-10 ENCOUNTER — Encounter: Payer: Self-pay | Admitting: Family Medicine

## 2023-08-10 DIAGNOSIS — R051 Acute cough: Secondary | ICD-10-CM | POA: Insufficient documentation

## 2023-08-10 NOTE — Assessment & Plan Note (Signed)
 Controlled, no change in medication

## 2023-08-10 NOTE — Assessment & Plan Note (Signed)
 Post covid cough c/w uncontrolled allergies, daily use of allergy meds astelin  and singulair , prednisone  x 5 days and clorpheniramine tab for 3 to 5 days then as needed. CXR

## 2023-08-13 ENCOUNTER — Other Ambulatory Visit: Payer: Self-pay | Admitting: *Deleted

## 2023-08-13 NOTE — Patient Instructions (Signed)
 Visit Information  Thank you for taking time to visit with me today. Please don't hesitate to contact me if I can be of assistance to you before our next scheduled appointment.  Your next care management appointment is by telephone on 09-11-23 at 9:00 am  Telephone follow-up in 1 month  Please call the care guide team at [phone] if you need to cancel, schedule, or reschedule an appointment.   Please call the Suicide and Crisis Lifeline: 988 call the [place]  National Suicide Prevention Lifeline: 760-335-8408 or TTY: (505)455-3154 TTY (424)074-0201) to talk to a trained counselor call the [facility]: [phone] if you are experiencing a Mental Health or Behavioral Health Crisis or need someone to talk to.  Teo Moede, RN, BSN, Theatre manager [employer] [phone]

## 2023-08-13 NOTE — Patient Outreach (Signed)
 Complex Care Management   Visit Note  08/13/2023  Name:  Lisa Mann MRN: [id number] DOB: November 24, 1933  Situation: Referral received for Complex Care Management related to Diabetes with Complications I obtained verbal consent from Patient.  Visit completed with Patient  on the phone  Background:   Past Medical History:  Diagnosis Date   Breast cancer, right breast (HCC) 2010   Cancer (HCC) 2008 and 2010   , first was left then right   Cancer of breast, intraductal 2008    left ( treated surgically and with radiation)   Diabetes mellitus, type 2 (HCC)    controlled    DJD (degenerative joint disease)    Fracture of pelvis    Fracture of wrist    left    Fracture, ribs    GERD (gastroesophageal reflux disease)    Gout    History of hiatal hernia    Hyperlipidemia    Hypertension    Low back pain    Lumbar radiculopathy    Obesity     Assessment: Patient Reported Symptoms:  Cognitive Cognitive Status: Alert and oriented to person, place, and time, Insightful and able to interpret abstract concepts, Normal speech and language skills Cognitive/Intellectual Conditions Management [RPT]: None reported or documented in medical history or problem list   Health Maintenance Behaviors: Annual physical exam Healing Pattern: Average Health Facilitated by: Prayer/meditation, Rest, Pain control  Neurological Neurological Review of Symptoms: No symptoms reported (Going to provider to be fitted for a different type of hearing aid.  The ones she currently has fall out her ear a lot.) Neurological Management Strategies: Routine screening, Medication therapy Neurological Self-Management Outcome: 4 (good)  HEENT HEENT Symptoms Reported: No symptoms reported HEENT Management Strategies: Routine screening HEENT Self-Management Outcome: 4 (good)    Cardiovascular Cardiovascular Symptoms Reported: No symptoms reported Cardiovascular Management Strategies: Routine screening, Medication  therapy Cardiovascular Self-Management Outcome: 4 (good)  Respiratory Respiratory Symptoms Reported: Other: Other Respiratory Symptoms: Cough is better.  Occassional cough at night.  Provider has precribed cough medication as needed. Respiratory Management Strategies: Adequate rest, Medication therapy, Routine screening Respiratory Self-Management Outcome: 4 (good)  Endocrine Endocrine Symptoms Reported: No symptoms reported Is patient diabetic?: Yes Is patient checking blood sugars at home?: Yes List most recent blood sugar readings, include date and time of day: BS-113 Fasting 08/13/23 Endocrine Self-Management Outcome: 4 (good)  Gastrointestinal Gastrointestinal Symptoms Reported: No symptoms reported Gastrointestinal Self-Management Outcome: 4 (good) Nutrition Risk Screen (CP): No indicators present  Genitourinary Genitourinary Symptoms Reported: No symptoms reported Genitourinary Self-Management Outcome: 4 (good)  Integumentary Integumentary Symptoms Reported: No symptoms reported Skin Management Strategies: Routine screening Skin Self-Management Outcome: 4 (good)  Musculoskeletal Musculoskelatal Symptoms Reviewed: Back pain Additional Musculoskeletal Details: Walk with a cane.  Wears Back brace when ambulating.  Will occasionally wear a patch to help with back pain. Musculoskeletal Management Strategies: Medication therapy, Routine screening Musculoskeletal Self-Management Outcome: 4 (good) Falls in the past year?: No Number of falls in past year: 1 or less Was there an injury with Fall?: No Fall Risk Category Calculator: 0 Patient Fall Risk Level: Low Fall Risk Patient at Risk for Falls Due to: No Fall Risks Fall risk Follow up: Falls evaluation completed  Psychosocial Psychosocial Symptoms Reported: No symptoms reported Behavioral Management Strategies: Support system Behavioral Health Self-Management Outcome: 4 (good) Major Change/Loss/Stressor/Fears (CP): Denies Techniques  to Cope with Loss/Stress/Change: Not applicable Quality of Family Relationships: helpful, involved, supportive Do you feel physically threatened by others?: No  08/13/2023    9:38 AM  Depression screen PHQ 2/9  Decreased Interest 0  Down, Depressed, Hopeless 0  PHQ - 2 Score 0    There were no vitals filed for this visit.  Medications Reviewed Today     Reviewed by Jorja Nichole LABOR, RN (Case Manager) on 08/13/23 at [phone]  Med List Status: <None>   Medication Order Taking? Sig Documenting Provider Last Dose Status Informant  allopurinol  (ZYLOPRIM ) 100 MG tablet [id number] Yes TAKE 1 TABLET(100 MG) BY MOUTH DAILY Antonetta Rollene BRAVO, MD  Active   aspirin  81 MG EC tablet [id number] Yes Take 1 tablet (81 mg total) by mouth daily. Swallow whole. Antonetta Rollene BRAVO, MD  Active Self  atorvastatin  (LIPITOR) 10 MG tablet [id number] Yes TAKE 1 TABLET(10 MG) BY MOUTH DAILY Antonetta Rollene BRAVO, MD  Active   azelastine  (ASTELIN ) 0.1 % nasal spray [id number] Yes Place 1 spray into both nostrils 2 (two) times daily. Use in each nostril as directed Antonetta Rollene BRAVO, MD  Active   Calcium  Carbonate-Vit D-Min (CALTRATE 600+D PLUS MINERALS) 600-800 MG-UNIT CHEW [id number] Yes Take one twice daily Antonetta Rollene BRAVO, MD  Active Self  chlorpheniramine  (CHLOR-TRIMETON ) 4 MG tablet [id number] Yes Take 1 tablet (4 mg total) by mouth 2 (two) times daily as needed for allergies. Antonetta Rollene BRAVO, MD  Active   Cholecalciferol  (VITAMIN D3) 25 MCG (1000 UT) CAPS [id number]  Take 1 capsule (1,000 Units total) by mouth daily.  Patient not taking: Reported on 08/13/2023   Antonetta Rollene BRAVO, MD  Active Self  cyanocobalamin  (VITAMIN B12) 500 MCG tablet [id number] Yes Take 1 tablet (500 mcg total) by mouth daily. Antonetta Rollene BRAVO, MD  Active Self  gabapentin  (NEURONTIN ) 100 MG capsule [id number] Yes Take 1 capsule (100 mg total) by mouth at bedtime. Antonetta Rollene BRAVO, MD  Active            Med Note PATTRICIA, CRYSTAL  L   Thu May 01, 2023  2:15 PM) prn  glucose blood (ACCU-CHEK GUIDE) test strip [id number] Yes USE TO TEST BLOOD SUGAR ONCE DAILY Antonetta Rollene BRAVO, MD  Active   guaiFENesin  (MUCINEX ) 600 MG 12 hr tablet [id number] Yes Take 1 tablet (600 mg total) by mouth 2 (two) times daily as needed. Stuart Vernell Almarie DEVONNA  Active   Lancet Device MISC [id number] Yes Softclick lancet device Antonetta Rollene BRAVO, MD  Active   Lancets 30G MISC [id number] Yes Once daily testing. Uses accucheck Guide DX E11.65 Antonetta Rollene BRAVO, MD  Active   levothyroxine  (SYNTHROID ) 75 MCG tablet [id number] Yes Take 1 tablet (75 mcg total) by mouth daily before breakfast. Nida, Gebreselassie W, MD  Active   metFORMIN  (GLUCOPHAGE ) 1000 MG tablet [id number] Yes TAKE 1 TABLET(1000 MG) BY MOUTH TWICE DAILY WITH A MEAL Antonetta Rollene BRAVO, MD  Active   montelukast  (SINGULAIR ) 10 MG tablet [id number] Yes TAKE 1 TABLET(10 MG) BY MOUTH AT BEDTIME Antonetta Rollene BRAVO, MD  Active   potassium chloride  (KLOR-CON ) 10 MEQ tablet [id number] Yes TAKE 1 TABLET(10 MEQ) BY MOUTH DAILY Antonetta Rollene BRAVO, MD  Active   predniSONE  (DELTASONE ) 5 MG tablet [id number] Yes Take 1 tablet (5 mg total) by mouth 2 (two) times daily for 5 days. Antonetta Rollene BRAVO, MD  Active   ramipril  (ALTACE ) 10 MG capsule [id number] Yes TAKE 1 CAPSULE(10 MG) BY MOUTH DAILY Antonetta Rollene BRAVO, MD  Active   traMADol  (ULTRAM ) 50 MG tablet [id number] Yes Take one tablet by mouth every 8 to  12 hours as needed, for severe pain Antonetta Rollene BRAVO, MD  Active   triamterene -hydrochlorothiazide  (MAXZIDE ) 75-50 MG tablet [id number] Yes TAKE 1 TABLET BY MOUTH DAILY Antonetta Rollene BRAVO, MD  Active   UNABLE TO FIND [id number]  Diabetic shoes and inserts x 3  DX E11.9 Antonetta Rollene BRAVO, MD  Active             Recommendation:   Continue Current Plan of Care  Follow Up Plan:   Telephone follow-up in 1 month  Lisa Hudman, RN, BSN, ACM RN Care Manager [employer] [phone]

## 2023-08-18 ENCOUNTER — Ambulatory Visit (INDEPENDENT_AMBULATORY_CARE_PROVIDER_SITE_OTHER): Admitting: Gastroenterology

## 2023-08-18 ENCOUNTER — Ambulatory Visit: Payer: Self-pay | Admitting: Family Medicine

## 2023-08-18 ENCOUNTER — Encounter (INDEPENDENT_AMBULATORY_CARE_PROVIDER_SITE_OTHER): Payer: Self-pay | Admitting: Gastroenterology

## 2023-08-18 VITALS — BP 128/74 | HR 74 | Temp 97.8°F | Ht 63.0 in | Wt 170.6 lb

## 2023-08-18 DIAGNOSIS — K5900 Constipation, unspecified: Secondary | ICD-10-CM
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
## 2023-08-18 NOTE — Patient Instructions (Signed)
 You can continue stool softener every other day as you are doing If bowels are moving well, you can hold off on resuming metamucil, however, if you start having more constipation, we may need to resume the metamucil  Increase water intake, aim for atleast 64 oz per day Increase fruits, veggies and whole grains, kiwi and prunes are especially good for constipation   Follow up as needed  It was a pleasure to see you today. I want to create trusting relationships with patients and provide genuine, compassionate, and quality care. I truly value your feedback! please be on the lookout for a survey regarding your visit with me today. I appreciate your input about our visit and your time in completing this!    Safwan Tomei L. Yarenis Cerino, MSN, APRN, AGNP-C Adult-Gerontology Nurse Practitioner [facility]
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
## 2023-09-11 ENCOUNTER — Other Ambulatory Visit: Payer: Self-pay | Admitting: *Deleted

## 2023-09-11 NOTE — Patient Outreach (Signed)
 Complex Care Management   Visit Note  09/11/2023  Name:  Lisa Mann MRN: [id number] DOB: 01/27/1933  Situation: Referral received for Complex Care Management related to Diabetes with Complications I obtained verbal consent from Patient.  Visit completed with Patient  on the phone  Background:   Past Medical History:  Diagnosis Date   Breast cancer, right breast (HCC) 2010   Cancer (HCC) 2008 and 2010   , first was left then right   Cancer of breast, intraductal 2008    left ( treated surgically and with radiation)   Diabetes mellitus, type 2 (HCC)    controlled    DJD (degenerative joint disease)    Fracture of pelvis    Fracture of wrist    left    Fracture, ribs    GERD (gastroesophageal reflux disease)    Gout    History of hiatal hernia    Hyperlipidemia    Hypertension    Low back pain    Lumbar radiculopathy    Obesity     Assessment: Patient Reported Symptoms:  Cognitive Cognitive Status: No symptoms reported Cognitive/Intellectual Conditions Management [RPT]: None reported or documented in medical history or problem list   Health Maintenance Behaviors: Annual physical exam Healing Pattern: Average Health Facilitated by: Prayer/meditation, Rest  Neurological Neurological Review of Symptoms: No symptoms reported Neurological Self-Management Outcome: 4 (good)  HEENT HEENT Symptoms Reported: No symptoms reported HEENT Self-Management Outcome: 4 (good)    Cardiovascular Cardiovascular Symptoms Reported: No symptoms reported Does patient have uncontrolled Hypertension?: No Cardiovascular Self-Management Outcome: 4 (good)  Respiratory Respiratory Symptoms Reported: No symptoms reported Respiratory Self-Management Outcome: 4 (good)  Endocrine Endocrine Symptoms Reported: No symptoms reported Is patient diabetic?: Yes Is patient checking blood sugars at home?: Yes List most recent blood sugar readings, include date and time of day: 09/11/23 at 910 111 Endocrine  Self-Management Outcome: 4 (good)  Gastrointestinal Gastrointestinal Symptoms Reported: No symptoms reported Gastrointestinal Self-Management Outcome: 4 (good) Nutrition Risk Screen (CP): No indicators present  Genitourinary Genitourinary Symptoms Reported: No symptoms reported Genitourinary Self-Management Outcome: 4 (good)  Integumentary Integumentary Symptoms Reported: No symptoms reported Skin Self-Management Outcome: 4 (good)  Musculoskeletal Musculoskelatal Symptoms Reviewed: No symptoms reported Musculoskeletal Management Strategies: Medical device Musculoskeletal Self-Management Outcome: 4 (good) Falls in the past year?: No Number of falls in past year: 1 or less Was there an injury with Fall?: No Fall Risk Category Calculator: 0 Patient Fall Risk Level: Low Fall Risk Patient at Risk for Falls Due to: No Fall Risks  Psychosocial Psychosocial Symptoms Reported: No symptoms reported Behavioral Management Strategies: Support system Behavioral Health Self-Management Outcome: 4 (good) Major Change/Loss/Stressor/Fears (CP): Denies Techniques to Cope with Loss/Stress/Change: Not applicable Quality of Family Relationships: helpful, involved, supportive Do you feel physically threatened by others?: No    09/11/2023    PHQ2-9 Depression Screening   Little interest or pleasure in doing things Not at all  Feeling down, depressed, or hopeless Not at all  PHQ-2 - Total Score 0  Trouble falling or staying asleep, or sleeping too much    Feeling tired or having little energy    Poor appetite or overeating     Feeling bad about yourself - or that you are a failure or have let yourself or your family down    Trouble concentrating on things, such as reading the newspaper or watching television    Moving or speaking so slowly that other people could have noticed.  Or the opposite - being so  fidgety or restless that you have been moving around a lot more than usual    Thoughts that you would be  better off dead, or hurting yourself in some way    PHQ2-9 Total Score    If you checked off any problems, how difficult have these problems made it for you to do your work, take care of things at home, or get along with other people    Depression Interventions/Treatment      There were no vitals filed for this visit.  Medications Reviewed Today     Reviewed by Bertrum Rosina HERO, RN (Registered Nurse) on 09/11/23 at [id number]  Med List Status: <None>   Medication Order Taking? Sig Documenting Provider Last Dose Status Informant  allopurinol  (ZYLOPRIM ) 100 MG tablet [id number] Yes TAKE 1 TABLET(100 MG) BY MOUTH DAILY Antonetta Rollene BRAVO, MD  Active   aspirin  81 MG EC tablet [id number] Yes Take 1 tablet (81 mg total) by mouth daily. Swallow whole. Antonetta Rollene BRAVO, MD  Active Self  atorvastatin  (LIPITOR) 10 MG tablet [id number] Yes TAKE 1 TABLET(10 MG) BY MOUTH DAILY Antonetta Rollene BRAVO, MD  Active   azelastine  (ASTELIN ) 0.1 % nasal spray [id number] Yes Place 1 spray into both nostrils 2 (two) times daily. Use in each nostril as directed  Patient taking differently: Place 1 spray into both nostrils daily as needed. Use in each nostril as directed   Antonetta Rollene BRAVO, MD  Active   Calcium  Carbonate-Vit D-Min (CALTRATE 600+D PLUS MINERALS) 600-800 MG-UNIT CHEW [id number] Yes Take one twice daily  Patient taking differently: Chew 1 tablet by mouth every other day. Take one twice daily   Antonetta Rollene BRAVO, MD  Active Self  chlorpheniramine  (CHLOR-TRIMETON ) 4 MG tablet [id number] Yes Take 1 tablet (4 mg total) by mouth 2 (two) times daily as needed for allergies. Antonetta Rollene BRAVO, MD  Active   Cholecalciferol  (VITAMIN D3) 25 MCG (1000 UT) CAPS [id number] Yes Take 1 capsule (1,000 Units total) by mouth daily.  Patient taking differently: Take 1,000 Units by mouth every other day.   Antonetta Rollene BRAVO, MD  Active Self  cyanocobalamin  (VITAMIN B12) 500 MCG tablet [id number] Yes Take 1 tablet (500 mcg  total) by mouth daily. Antonetta Rollene BRAVO, MD  Active Self  gabapentin  (NEURONTIN ) 100 MG capsule [id number] Yes Take 1 capsule (100 mg total) by mouth at bedtime. Antonetta Rollene BRAVO, MD  Active            Med Note GARNET, Duward Allbritton M   Thu Sep 11, 2023  9:10 AM)    glucose blood (ACCU-CHEK GUIDE) test strip [id number] Yes USE TO TEST BLOOD SUGAR ONCE DAILY Antonetta Rollene BRAVO, MD  Active   guaiFENesin  (MUCINEX ) 600 MG 12 hr tablet [id number] Yes Take 1 tablet (600 mg total) by mouth 2 (two) times daily as needed. Stuart Vernell Almarie DEVONNA  Active   Lancet Device MISC [id number] Yes Softclick lancet device Antonetta Rollene BRAVO, MD  Active   Lancets 30G MISC [id number] Yes Once daily testing. Uses accucheck Guide DX E11.65 Antonetta Rollene BRAVO, MD  Active   levothyroxine  (SYNTHROID ) 75 MCG tablet [id number] Yes Take 1 tablet (75 mcg total) by mouth daily before breakfast. Nida, Gebreselassie W, MD  Active   metFORMIN  (GLUCOPHAGE ) 1000 MG tablet [id number] Yes TAKE 1 TABLET(1000 MG) BY MOUTH TWICE DAILY WITH A MEAL Antonetta Rollene BRAVO, MD  Active   montelukast  (SINGULAIR ) 10 MG tablet [id number] Yes TAKE 1 TABLET(10 MG) BY MOUTH AT BEDTIME  Antonetta Rollene BRAVO, MD  Active   potassium chloride  (KLOR-CON ) 10 MEQ tablet [id number] Yes TAKE 1 TABLET(10 MEQ) BY MOUTH DAILY Antonetta Rollene BRAVO, MD  Active   ramipril  (ALTACE ) 10 MG capsule [id number] Yes TAKE 1 CAPSULE(10 MG) BY MOUTH DAILY Antonetta Rollene BRAVO, MD  Active   traMADol  (ULTRAM ) 50 MG tablet [id number] Yes Take one tablet by mouth every 8 to 12 hours as needed, for severe pain Antonetta Rollene BRAVO, MD  Active   triamterene -hydrochlorothiazide  (MAXZIDE ) 75-50 MG tablet [id number] Yes TAKE 1 TABLET BY MOUTH DAILY Antonetta Rollene BRAVO, MD  Active   UNABLE TO FIND [id number]  Diabetic shoes and inserts x 3  DX E11.9  Patient taking differently: Diabetic shoes and inserts x 3  DX E11.9   Antonetta Rollene BRAVO, MD  Active             Recommendation:    Continue Current Plan of Care  Follow Up Plan:   Telephone follow-up in 1 month  Rosina Forte, BSN RN [facility], [facility] RN Care Manager Direct Dial: [phone]  Fax: [phone]

## 2023-09-11 NOTE — Patient Instructions (Signed)
 Visit Information  Thank you for taking time to visit with me today. Please don't hesitate to contact me if I can be of assistance to you before our next scheduled appointment.  Your next care management appointment is by telephone on 10-13-2023 at 9:00 am  Telephone follow-up in 1 month  Please call the care guide team at [phone] if you need to cancel, schedule, or reschedule an appointment.   Please call the Suicide and Crisis Lifeline: 988 call the [place]  National Suicide Prevention Lifeline: 403-299-5151 or TTY: (651)256-1159 TTY 804-038-9995) to talk to a trained counselor call 1-800-273-TALK (toll free, 24 hour hotline) call the [facility]: [phone] call 911 if you are experiencing a Mental Health or Behavioral Health Crisis or need someone to talk to.  Rosina Forte, BSN RN [facility], [facility] RN Care Manager Direct Dial: [phone]  Fax: [phone]

## 2023-09-25 DIAGNOSIS — M79672 Pain in left foot: Secondary | ICD-10-CM | POA: Diagnosis not present

## 2023-09-25 DIAGNOSIS — E1151 Type 2 diabetes mellitus with diabetic peripheral angiopathy without gangrene: Secondary | ICD-10-CM | POA: Diagnosis not present

## 2023-09-25 DIAGNOSIS — I739 Peripheral vascular disease, unspecified: Secondary | ICD-10-CM | POA: Diagnosis not present

## 2023-09-25 DIAGNOSIS — E1140 Type 2 diabetes mellitus with diabetic neuropathy, unspecified: Secondary | ICD-10-CM | POA: Diagnosis not present

## 2023-09-25 DIAGNOSIS — L110 Acquired keratosis follicularis: Secondary | ICD-10-CM | POA: Diagnosis not present

## 2023-09-25 DIAGNOSIS — M79671 Pain in right foot: Secondary | ICD-10-CM | POA: Diagnosis not present

## 2023-10-02 ENCOUNTER — Other Ambulatory Visit: Payer: Self-pay | Admitting: Family Medicine

## 2023-10-07 ENCOUNTER — Other Ambulatory Visit: Payer: Self-pay | Admitting: Family Medicine

## 2023-10-13 ENCOUNTER — Other Ambulatory Visit: Payer: Self-pay | Admitting: *Deleted

## 2023-10-13 NOTE — Patient Instructions (Signed)
 Visit Information  Thank you for taking time to visit with me today. Please don't hesitate to contact me if I can be of assistance to you before our next scheduled appointment.  Your next care management appointment is no further scheduled appointments.    Patient has met all care management goals. Care Management case will be closed. Patient has been provided contact information should new needs arise.   Please call the care guide team at [phone] if you need to cancel, schedule, or reschedule an appointment.   Please call the Suicide and Crisis Lifeline: 988 call the [place]  National Suicide Prevention Lifeline: (365)090-2604 or TTY: (979)446-6935 TTY 502-037-9246) to talk to a trained counselor call 1-800-273-TALK (toll free, 24 hour hotline) if you are experiencing a Mental Health or Behavioral Health Crisis or need someone to talk to.  Rosina Forte, BSN RN [facility], [facility] RN Care Manager Direct Dial: [phone]  Fax: [phone]

## 2023-10-13 NOTE — Patient Outreach (Signed)
 Complex Care Management   Visit Note  10/13/2023  Name:  Lisa Mann MRN: [id number] DOB: 03-14-1933  Situation: Referral received for Complex Care Management related to Diabetes with Complications I obtained verbal consent from Patient.  Visit completed with Patient  on the phone  Background:   Past Medical History:  Diagnosis Date   Breast cancer, right breast (HCC) 2010   Cancer (HCC) 2008 and 2010   , first was left then right   Cancer of breast, intraductal 2008    left ( treated surgically and with radiation)   Diabetes mellitus, type 2 (HCC)    controlled    DJD (degenerative joint disease)    Fracture of pelvis    Fracture of wrist    left    Fracture, ribs    GERD (gastroesophageal reflux disease)    Gout    History of hiatal hernia    Hyperlipidemia    Hypertension    Low back pain    Lumbar radiculopathy    Obesity     Assessment: Patient Reported Symptoms:  Cognitive Cognitive Status: No symptoms reported Cognitive/Intellectual Conditions Management [RPT]: None reported or documented in medical history or problem list   Health Maintenance Behaviors: Annual physical exam Healing Pattern: Average Health Facilitated by: Prayer/meditation  Neurological Neurological Review of Symptoms: No symptoms reported Neurological Management Strategies: Routine screening Neurological Self-Management Outcome: 4 (good)  HEENT HEENT Symptoms Reported: No symptoms reported HEENT Management Strategies: Routine screening HEENT Self-Management Outcome: 4 (good)    Cardiovascular Cardiovascular Symptoms Reported: No symptoms reported Does patient have uncontrolled Hypertension?: No Cardiovascular Management Strategies: Routine screening Cardiovascular Self-Management Outcome: 4 (good) Cardiovascular Comment: occassional swelling when she does not wear her diabetic shoes  Respiratory Respiratory Symptoms Reported: No symptoms reported Respiratory Management Strategies:  Routine screening Respiratory Self-Management Outcome: 4 (good)  Endocrine Endocrine Symptoms Reported: No symptoms reported Is patient diabetic?: Yes Is patient checking blood sugars at home?: Yes List most recent blood sugar readings, include date and time of day: 10-13-2023 at 0730 121 Endocrine Self-Management Outcome: 4 (good)  Gastrointestinal Gastrointestinal Symptoms Reported: No symptoms reported Gastrointestinal Self-Management Outcome: 4 (good) Nutrition Risk Screen (CP): No indicators present  Genitourinary Genitourinary Symptoms Reported: No symptoms reported Genitourinary Self-Management Outcome: 4 (good)  Integumentary Integumentary Symptoms Reported: No symptoms reported Skin Management Strategies: Routine screening Skin Self-Management Outcome: 4 (good)  Musculoskeletal Musculoskelatal Symptoms Reviewed: No symptoms reported Musculoskeletal Management Strategies: Routine screening Musculoskeletal Self-Management Outcome: 4 (good) Falls in the past year?: No Number of falls in past year: 1 or less Was there an injury with Fall?: No Fall Risk Category Calculator: 0 Patient Fall Risk Level: Low Fall Risk Patient at Risk for Falls Due to: No Fall Risks Fall risk Follow up: Falls evaluation completed  Psychosocial Psychosocial Symptoms Reported: No symptoms reported Behavioral Management Strategies: Support system Behavioral Health Self-Management Outcome: 4 (good) Major Change/Loss/Stressor/Fears (CP): Denies Techniques to Cope with Loss/Stress/Change: Not applicable Quality of Family Relationships: helpful, involved, supportive Do you feel physically threatened by others?: No    10/13/2023    PHQ2-9 Depression Screening   Little interest or pleasure in doing things Not at all  Feeling down, depressed, or hopeless Not at all  PHQ-2 - Total Score 0  Trouble falling or staying asleep, or sleeping too much    Feeling tired or having little energy    Poor appetite or  overeating     Feeling bad about yourself - or that you are a failure  or have let yourself or your family down    Trouble concentrating on things, such as reading the newspaper or watching television    Moving or speaking so slowly that other people could have noticed.  Or the opposite - being so fidgety or restless that you have been moving around a lot more than usual    Thoughts that you would be better off dead, or hurting yourself in some way    PHQ2-9 Total Score    If you checked off any problems, how difficult have these problems made it for you to do your work, take care of things at home, or get along with other people    Depression Interventions/Treatment      There were no vitals filed for this visit.  Medications Reviewed Today     Reviewed by Bertrum Rosina HERO, RN (Registered Nurse) on 10/13/23 at [phone]  Med List Status: <None>   Medication Order Taking? Sig Documenting Provider Last Dose Status Informant  ACCU-CHEK GUIDE TEST test strip [id number] Yes USE TO TEST BLOOD SUGAR ONCE DAILY Antonetta Rollene BRAVO, MD  Active   allopurinol  (ZYLOPRIM ) 100 MG tablet [id number] Yes TAKE 1 TABLET(100 MG) BY MOUTH DAILY Antonetta Rollene BRAVO, MD  Active   aspirin  81 MG EC tablet [id number] Yes Take 1 tablet (81 mg total) by mouth daily. Swallow whole. Antonetta Rollene BRAVO, MD  Active Self  atorvastatin  (LIPITOR) 10 MG tablet [id number] Yes TAKE 1 TABLET(10 MG) BY MOUTH DAILY Antonetta Rollene BRAVO, MD  Active   azelastine  (ASTELIN ) 0.1 % nasal spray [id number] Yes Place 1 spray into both nostrils 2 (two) times daily. Use in each nostril as directed  Patient taking differently: Place 1 spray into both nostrils daily as needed. Use in each nostril as directed   Antonetta Rollene BRAVO, MD  Active   Calcium  Carbonate-Vit D-Min (CALTRATE 600+D PLUS MINERALS) 600-800 MG-UNIT CHEW [id number] Yes Take one twice daily  Patient taking differently: Chew 1 tablet by mouth every other day. Take one twice daily    Antonetta Rollene BRAVO, MD  Active Self  chlorpheniramine  (CHLOR-TRIMETON ) 4 MG tablet [id number] Yes Take 1 tablet (4 mg total) by mouth 2 (two) times daily as needed for allergies. Antonetta Rollene BRAVO, MD  Active   Cholecalciferol  (VITAMIN D3) 25 MCG (1000 UT) CAPS [id number] Yes Take 1 capsule (1,000 Units total) by mouth daily. Antonetta Rollene BRAVO, MD  Active Self  cyanocobalamin  (VITAMIN B12) 500 MCG tablet [id number] Yes Take 1 tablet (500 mcg total) by mouth daily. Antonetta Rollene BRAVO, MD  Active Self  gabapentin  (NEURONTIN ) 100 MG capsule [id number] Yes Take 1 capsule (100 mg total) by mouth at bedtime. Antonetta Rollene BRAVO, MD  Active            Med Note GARNET, Rylan Bernard M   Thu Sep 11, 2023  9:10 AM)    guaiFENesin  (MUCINEX ) 600 MG 12 hr tablet [id number] Yes Take 1 tablet (600 mg total) by mouth 2 (two) times daily as needed. Stuart Vernell Almarie DEVONNA  Active   Lancet Device MISC [id number] Yes Softclick lancet device Antonetta Rollene BRAVO, MD  Active   Lancets 30G MISC [id number] Yes Once daily testing. Uses accucheck Guide DX E11.65 Antonetta Rollene BRAVO, MD  Active   levothyroxine  (SYNTHROID ) 75 MCG tablet [id number] Yes Take 1 tablet (75 mcg total) by mouth daily before breakfast. Nida, Gebreselassie W, MD  Active   metFORMIN  (GLUCOPHAGE ) 1000 MG tablet [id number] Yes TAKE 1 TABLET(1000 MG) BY MOUTH  TWICE DAILY WITH A MEAL Antonetta Rollene BRAVO, MD  Active   montelukast  (SINGULAIR ) 10 MG tablet [id number] Yes TAKE 1 TABLET(10 MG) BY MOUTH AT BEDTIME Antonetta Rollene BRAVO, MD  Active   potassium chloride  (KLOR-CON ) 10 MEQ tablet [id number] Yes TAKE 1 TABLET(10 MEQ) BY MOUTH DAILY Antonetta Rollene BRAVO, MD  Active   ramipril  (ALTACE ) 10 MG capsule [id number] Yes TAKE 1 CAPSULE(10 MG) BY MOUTH DAILY Antonetta Rollene BRAVO, MD  Active   traMADol  (ULTRAM ) 50 MG tablet [id number] Yes Take one tablet by mouth every 8 to 12 hours as needed, for severe pain Antonetta Rollene BRAVO, MD  Active   triamterene -hydrochlorothiazide   (MAXZIDE ) 75-50 MG tablet [id number] Yes TAKE 1 TABLET BY MOUTH DAILY Antonetta Rollene BRAVO, MD  Active   UNABLE TO FIND [id number] Yes Diabetic shoes and inserts x 3  DX E11.9  Patient taking differently: Diabetic shoes and inserts x 3  DX E11.9   Antonetta Rollene BRAVO, MD  Active             Recommendation:   Continue Current Plan of Care  Follow Up Plan:   Patient has met all care management goals. Care Management case will be closed. Patient has been provided contact information should new needs arise.   Rosina Forte, BSN RN [facility], [facility] RN Care Manager Direct Dial: [phone]  Fax: [phone]

## 2023-10-21 DIAGNOSIS — M532X1 Spinal instabilities, occipito-atlanto-axial region: Secondary | ICD-10-CM | POA: Diagnosis not present

## 2023-10-21 DIAGNOSIS — M542 Cervicalgia: Secondary | ICD-10-CM | POA: Diagnosis not present

## 2023-10-22 ENCOUNTER — Encounter (INDEPENDENT_AMBULATORY_CARE_PROVIDER_SITE_OTHER): Payer: Self-pay | Admitting: Gastroenterology

## 2023-10-25 ENCOUNTER — Other Ambulatory Visit: Payer: Self-pay | Admitting: Family Medicine

## 2023-11-08 ENCOUNTER — Other Ambulatory Visit: Payer: Self-pay | Admitting: Family Medicine

## 2023-11-10 ENCOUNTER — Encounter: Payer: Self-pay | Admitting: Radiology

## 2023-11-17 ENCOUNTER — Other Ambulatory Visit: Payer: Self-pay | Admitting: Family Medicine

## 2023-11-21 ENCOUNTER — Ambulatory Visit: Admitting: Family Medicine

## 2023-11-21 ENCOUNTER — Encounter: Payer: Self-pay | Admitting: Family Medicine

## 2023-11-21 VITALS — BP 138/80 | HR 79 | Resp 16 | Ht 63.0 in | Wt 169.1 lb

## 2023-11-21 DIAGNOSIS — Z23 Encounter for immunization: Secondary | ICD-10-CM | POA: Diagnosis not present

## 2023-11-21 DIAGNOSIS — Z0001 Encounter for general adult medical examination with abnormal findings: Secondary | ICD-10-CM

## 2023-11-21 DIAGNOSIS — M109 Gout, unspecified: Secondary | ICD-10-CM

## 2023-11-21 DIAGNOSIS — E890 Postprocedural hypothyroidism: Secondary | ICD-10-CM | POA: Diagnosis not present

## 2023-11-21 DIAGNOSIS — E785 Hyperlipidemia, unspecified: Secondary | ICD-10-CM

## 2023-11-21 DIAGNOSIS — E1159 Type 2 diabetes mellitus with other circulatory complications: Secondary | ICD-10-CM

## 2023-11-21 DIAGNOSIS — E1169 Type 2 diabetes mellitus with other specified complication: Secondary | ICD-10-CM | POA: Diagnosis not present

## 2023-11-21 NOTE — Patient Instructions (Addendum)
 F/U ion 3 months  Flu vaccine today  Urine ACR today  Pls schedule dexa at checkout, past due  Your foot exam qualifies you for diabetic shoes, nurse pls give rx for diabetic shoes at checkout, also info as to where she can get them  Pls break your allergy pill in half, start taking in the morning  You may STOP testing blood sugar ,  have removed test supplies from your list  Fasting lipid, cmp and eGFr and hBa1cm TSH and uric acid 1 week before next appointment  Thanks for choosing [facility], we consider it a privelige to serve you.

## 2023-11-21 NOTE — Assessment & Plan Note (Signed)

## 2023-11-21 NOTE — Assessment & Plan Note (Addendum)
 Diabetes associated with hypertension, hyperlipidemia, and arthritis  Lisa Mann is reminded of the importance of commitment to daily physical activity for 30 minutes or more, as able and the need to limit carbohydrate intake to 30 to 60 grams per meal to help with blood sugar control.   The need to take medication as prescribed, test blood sugar as directed, and to call between visits if there is a concern that blood sugar is uncontrolled is also discussed.   Lisa Mann is reminded of the importance of daily foot exam, annual eye examination, and good blood sugar, blood pressure and cholesterol control.     Latest Ref Rng & Units 07/24/2023    8:32 AM 03/20/2023    8:59 AM 10/23/2022    9:34 AM 10/10/2022    8:15 AM 06/18/2022    8:14 AM  Diabetic Labs  HbA1c 4.8 - 5.6 % 6.4  6.8  6.9   6.9   Micro/Creat Ratio 0 - 29 mg/g creat    3    Chol 100 - 199 mg/dL  834    845   HDL >60 mg/dL  59    62   Calc LDL 0 - 99 mg/dL  84    77   Triglycerides 0 - 149 mg/dL  877    79   Creatinine 0.57 - 1.00 mg/dL 9.13  9.08  9.16   9.17       11/21/2023    8:36 AM 11/21/2023    8:18 AM 08/18/2023    1:19 PM 08/18/2023    1:16 PM 08/08/2023    3:18 PM 07/24/2023    8:04 AM 05/28/2023   12:41 PM  BP/Weight  Systolic BP 138 144 128 145 123 122 --  Diastolic BP 80 85 74 68 60 67 --  Wt. (Lbs)  169.12  170.6 172.25 173.12 172  BMI  29.96 kg/m2  30.22 kg/m2 30.51 kg/m2 30.67 kg/m2 30.47 kg/m2      11/21/2023    8:20 AM 10/23/2022    8:00 AM  Foot/eye exam completion dates  Foot Form Completion Done Done

## 2023-11-21 NOTE — Progress Notes (Signed)
 Lisa Mann     MRN: [id number]      DOB: August 12, 1933  Chief Complaint  Patient presents with   itching    Pt complains of itching in both hands at bedtime after taking montelukast . Started a few weeks ago    Annual Exam    HPI: Patient is in for annual physical exam. Recent labs,  are reviewed. Immunization is reviewed , and  updated if needed.   PE: Pleasant  female, alert and oriented x 3, in no cardio-pulmonary distress. Afebrile. HEENT No facial trauma or asymetry. Sinuses non tender.  Extra occullar muscles intact.. External ears normal, . Neck: supple, no adenopathy,JVD or thyromegaly.No bruits.  Chest: Clear to ascultation bilaterally.No crackles or wheezes. Non tender to palpation   Cardiovascular system; Heart sounds normal,  S1 and  S2 ,no S3.  No murmur, or thrill. Apical beat not displaced Peripheral pulses normal.  Abdomen: Soft, non tender, no organomegaly or masses.  Musculoskeletal exam: Decreased though adequate ROM of spine, hips , shoulders and knees. Mild  deformity ,swelling and  crepitus noted. No muscle wasting or atrophy.   Neurologic: Cranial nerves 2 to 12 intact. Power, tone ,sensation  normal throughout. No disturbance in gait. No tremor.  Skin: Intact, no ulceration, erythema , scaling or rash noted. Pigmentation normal throughout  Psych; Normal mood and affect. Judgement and concentration normal   Assessment & Plan:  Encounter for Medicare annual examination with abnormal findings Annual exam as documented. Counseling done  re healthy lifestyle involving commitment to 150 minutes exercise per week, heart healthy diet, and attaining healthy weight.The importance of adequate sleep also discussed. Regular seat belt use and home safety, is also discussed. Changes in health habits are decided on by the patient with goals and time frames  set for achieving them. Immunization and cancer screening needs are specifically  addressed at this visit.   Type 2 diabetes mellitus with other specified complication (HCC) Diabetes associated with hypertension, hyperlipidemia, and arthritis  Ms. Pinkham is reminded of the importance of commitment to daily physical activity for 30 minutes or more, as able and the need to limit carbohydrate intake to 30 to 60 grams per meal to help with blood sugar control.   The need to take medication as prescribed, test blood sugar as directed, and to call between visits if there is a concern that blood sugar is uncontrolled is also discussed.   Ms. Luckadoo is reminded of the importance of daily foot exam, annual eye examination, and good blood sugar, blood pressure and cholesterol control.     Latest Ref Rng & Units 07/24/2023    8:32 AM 03/20/2023    8:59 AM 10/23/2022    9:34 AM 10/10/2022    8:15 AM 06/18/2022    8:14 AM  Diabetic Labs  HbA1c 4.8 - 5.6 % 6.4  6.8  6.9   6.9   Micro/Creat Ratio 0 - 29 mg/g creat    3    Chol 100 - 199 mg/dL  834    845   HDL >60 mg/dL  59    62   Calc LDL 0 - 99 mg/dL  84    77   Triglycerides 0 - 149 mg/dL  877    79   Creatinine 0.57 - 1.00 mg/dL 9.13  9.08  9.16   9.17       11/21/2023    8:36 AM 11/21/2023    8:18 AM 08/18/2023  1:19 PM 08/18/2023    1:16 PM 08/08/2023    3:18 PM 07/24/2023    8:04 AM 05/28/2023   12:41 PM  BP/Weight  Systolic BP 138 144 128 145 123 122 --  Diastolic BP 80 85 74 68 60 67 --  Wt. (Lbs)  169.12  170.6 172.25 173.12 172  BMI  29.96 kg/m2  30.22 kg/m2 30.51 kg/m2 30.67 kg/m2 30.47 kg/m2      11/21/2023    8:20 AM 10/23/2022    8:00 AM  Foot/eye exam completion dates  Foot Form Completion Done Done        Immunization due After obtaining informed consent, the influenza vaccine is  administered , with no adverse effect noted at the time of administration.

## 2023-11-23 ENCOUNTER — Ambulatory Visit: Payer: Self-pay | Admitting: Family Medicine

## 2023-11-23 ENCOUNTER — Encounter: Payer: Self-pay | Admitting: Family Medicine

## 2023-11-23 LAB — MICROALBUMIN / CREATININE URINE RATIO
Creatinine, Urine: 98.4 mg/dL
Microalb/Creat Ratio: 3 mg/g{creat} (ref 0–29)
Microalbumin, Urine: 3.2 ug/mL

## 2023-11-23 NOTE — Assessment & Plan Note (Signed)
 After obtaining informed consent, the influenza vaccine is  administered , with no adverse effect noted at the time of administration.

## 2023-11-24 ENCOUNTER — Ambulatory Visit (HOSPITAL_COMMUNITY)
Admission: RE | Admit: 2023-11-24 | Discharge: 2023-11-24 | Disposition: A | Discharge status: Home / Self Care | Source: Ambulatory Visit | Attending: Family Medicine | Admitting: Family Medicine

## 2023-11-24 DIAGNOSIS — Z780 Asymptomatic menopausal state: Secondary | ICD-10-CM | POA: Diagnosis present

## 2023-11-25 ENCOUNTER — Other Ambulatory Visit: Payer: Self-pay

## 2023-11-25 DIAGNOSIS — I152 Hypertension secondary to endocrine disorders: Secondary | ICD-10-CM

## 2023-11-25 MED ORDER — UNABLE TO FIND: 0 refills | Status: AC

## 2023-11-27 ENCOUNTER — Other Ambulatory Visit: Payer: Self-pay | Admitting: Family Medicine

## 2023-11-29 ENCOUNTER — Ambulatory Visit: Payer: Self-pay | Admitting: Family Medicine

## 2023-12-29 ENCOUNTER — Other Ambulatory Visit: Payer: Self-pay | Admitting: Family Medicine

## 2023-12-29 DIAGNOSIS — E669 Obesity, unspecified: Secondary | ICD-10-CM

## 2023-12-29 DIAGNOSIS — E785 Hyperlipidemia, unspecified: Secondary | ICD-10-CM

## 2023-12-29 DIAGNOSIS — I10 Essential (primary) hypertension: Secondary | ICD-10-CM

## 2023-12-29 DIAGNOSIS — Z23 Encounter for immunization: Secondary | ICD-10-CM

## 2024-01-06 ENCOUNTER — Ambulatory Visit: Payer: Self-pay

## 2024-01-06 NOTE — Telephone Encounter (Signed)
 Pt daughter informed

## 2024-01-06 NOTE — Telephone Encounter (Signed)
 Noted that pt does not want clinical eval and g has been coughing for 1 day only, I anticipate that she will go to urgent care/ ED if symptoms persist or worsen. Advise fluids and OTC decongestants as she is  doing and plan on clinical eval as above if she worsens

## 2024-01-06 NOTE — Telephone Encounter (Signed)
 FYI Only or Action Required?: Action required by provider: clinical question for provider, update on patient condition, and requesting medication from the pharmacy.  Patient was last seen in primary care on 11/21/2023 by Antonetta Rollene BRAVO, MD.  Called Nurse Triage reporting Cough.  Symptoms began yesterday.  Interventions attempted: Rest, hydration, or home remedies.  Symptoms are: unchanged.  Triage Disposition: See Physician Within 24 Hours  Patient/caregiver understands and will follow disposition?: No, wishes to speak with PCP  Copied from CRM #[id number]. Topic: Clinical - Red Word Triage >> Jan 06, 2024  1:50 PM Ahlexyia S wrote: [facility] that prompted transfer to Nurse Triage: Pt daughter Darice called in stating that pt is having a worsening cough, sore throat and is unable to eat because she can't keep anything down. Pt is also coughing up a yellow greenish colored mucus and light diarrhea. Darice initially wanted to schedule pt but pt is refusing to come into the clinic. Warm transferred to nurse triage. Reason for Disposition  SEVERE coughing spells (e.g., whooping sound after coughing, vomiting after coughing)  Answer Assessment - Initial Assessment Questions Patient's daughter calling to report productive cough, sore throat and no appetite. Requesting medication be sent to patient's pharmacy. Daughter is requesting a call back from the office.   1. ONSET: When did the cough begin?      Started yesterday 2. SEVERITY: How bad is the cough today?      severe 3. SPUTUM: Describe the color of your sputum (e.g., none, dry cough; clear, white, yellow, green)     Yellow greenish 4. HEMOPTYSIS: Are you coughing up any blood? If Yes, ask: How much? (e.g., flecks, streaks, tablespoons, etc.)     no 5. DIFFICULTY BREATHING: Are you having difficulty breathing? If Yes, ask: How bad is it? (e.g., mild, moderate, severe)      no 6. FEVER: Do you have a fever? If Yes,  ask: What is your temperature, how was it measured, and when did it start?     Doesn't think so but she is having chills 7. CARDIAC HISTORY: Do you have any history of heart disease? (e.g., heart attack, congestive heart failure)      no 8. LUNG HISTORY: Do you have any history of lung disease?  (e.g., pulmonary embolus, asthma, emphysema)     no 9. PE RISK FACTORS: Do you have a history of blood clots? (or: recent major surgery, recent prolonged travel, bedridden)     no 10. OTHER SYMPTOMS: Do you have any other symptoms? (e.g., runny nose, wheezing, chest pain)       Sore throat, no appetite, diarrhea 11. TRAVEL: Have you traveled out of the country in the last month? (e.g., travel history, exposures)       no  Protocols used: Cough - Acute Productive-A-AH

## 2024-01-12 ENCOUNTER — Ambulatory Visit: Payer: Self-pay

## 2024-01-12 NOTE — Telephone Encounter (Signed)
 FYI Only or Action Required?: FYI only for provider: appointment scheduled on 01/13/24 at [facility].  Patient was last seen in primary care on 11/21/2023 by Antonetta Rollene BRAVO, MD.  Called Nurse Triage reporting Cough.  Symptoms began a week ago.  Interventions attempted: OTC medications: Decongestant.  Symptoms are: stable.  Triage Disposition: No disposition on file.  Patient/caregiver understands and will follow disposition?:  Reason for Disposition  Cough  Answer Assessment - Initial Assessment Questions Patient's daughter called in last week to report sore throat, no eating and diarrhea, patient reports those symptoms have subsided and it is just the cough. No appointments in PCP, scheduled next available at [facility]  1. ONSET: When did the cough begin?      Last week  2. SEVERITY: How bad is the cough today?      Better during the day, worse at night  3. SPUTUM: Describe the color of your sputum (e.g., none, dry cough; clear, white, yellow, green)     Clear  4. HEMOPTYSIS: Are you coughing up any blood? If Yes, ask: How much? (e.g., flecks, streaks, tablespoons, etc.)     Denies  5. DIFFICULTY BREATHING: Are you having difficulty breathing? If Yes, ask: How bad is it? (e.g., mild, moderate, severe)      Denies  6. FEVER: Do you have a fever? If Yes, ask: What is your temperature, how was it measured, and when did it start?     Unable to assess  7. CARDIAC HISTORY: Do you have any history of heart disease? (e.g., heart attack, congestive heart failure)      Denies  8. LUNG HISTORY: Do you have any history of lung disease?  (e.g., pulmonary embolus, asthma, emphysema)     Denies  9. PE RISK FACTORS: Do you have a history of blood clots? (or: recent major surgery, recent prolonged travel, bedridden)     Denies  10. OTHER SYMPTOMS: Do you have any other symptoms? (e.g., runny nose, wheezing, chest pain)       Denies  Protocols used: Cough - Acute  Productive-A-AH  Copied from CRM #[id number]. Topic: Clinical - Medical Advice >> Jan 12, 2024 10:45 AM China J wrote: Reason for CRM: Patient has been having a worsening cold. The only symptom she mentioned was coughing with phlem. She did try over the counter medication that was recommended but it's not helping.  Please call [phone] for an update.

## 2024-01-12 NOTE — Telephone Encounter (Signed)
Noted patient scheduled

## 2024-01-13 ENCOUNTER — Ambulatory Visit: Admitting: Family Medicine

## 2024-01-13 ENCOUNTER — Encounter: Payer: Self-pay | Admitting: Family Medicine

## 2024-01-13 VITALS — BP 140/64 | HR 85 | Temp 97.7°F | Ht 63.0 in | Wt 170.0 lb

## 2024-01-13 DIAGNOSIS — J069 Acute upper respiratory infection, unspecified: Secondary | ICD-10-CM
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
## 2024-02-03 ENCOUNTER — Telehealth: Payer: Self-pay | Admitting: Family Medicine

## 2024-02-03 ENCOUNTER — Telehealth: Payer: Self-pay | Admitting: "Endocrinology

## 2024-02-03 NOTE — Telephone Encounter (Unsigned)
 Copied from CRM #[id number]. Topic: General - Other >> Feb 03, 2024 10:20 AM Antony RAMAN wrote: Reason for CRM: wanting to know if dr simpson requested her to get another covid vaccine with the pharmacy since they have her scheduled for it on friday [phone]

## 2024-02-03 NOTE — Telephone Encounter (Signed)
 Please update labs

## 2024-02-03 NOTE — Telephone Encounter (Signed)
 Done

## 2024-02-04 ENCOUNTER — Other Ambulatory Visit: Payer: Self-pay | Admitting: *Deleted

## 2024-02-04 DIAGNOSIS — E890 Postprocedural hypothyroidism: Secondary | ICD-10-CM

## 2024-02-04 NOTE — Telephone Encounter (Signed)
Lab has been updated.

## 2024-02-06 ENCOUNTER — Other Ambulatory Visit: Payer: Self-pay | Admitting: Family Medicine

## 2024-02-06 ENCOUNTER — Other Ambulatory Visit: Payer: Self-pay | Admitting: "Endocrinology

## 2024-02-06 DIAGNOSIS — E890 Postprocedural hypothyroidism: Secondary | ICD-10-CM

## 2024-02-06 LAB — TSH: TSH: 2.51 u[IU]/mL (ref 0.450–4.500)

## 2024-02-06 LAB — T4, FREE: Free T4: 1.44 ng/dL (ref 0.82–1.77)

## 2024-02-10 ENCOUNTER — Ambulatory Visit: Admitting: "Endocrinology

## 2024-02-10 ENCOUNTER — Encounter: Payer: Self-pay | Admitting: "Endocrinology

## 2024-02-10 VITALS — BP 126/60 | HR 96 | Ht 63.0 in | Wt 170.2 lb

## 2024-02-10 DIAGNOSIS — E890 Postprocedural hypothyroidism: Secondary | ICD-10-CM

## 2024-02-10 MED ORDER — LEVOTHYROXINE SODIUM 75 MCG PO TABS: 75.0000 ug | ORAL_TABLET | Freq: Every day | ORAL | 3 refills | Status: AC

## 2024-02-11 ENCOUNTER — Ambulatory Visit: Payer: Medicare Other | Admitting: "Endocrinology

## 2024-02-24 ENCOUNTER — Ambulatory Visit: Admitting: Family Medicine

## 2024-06-01 ENCOUNTER — Ambulatory Visit

## 2024-08-09 ENCOUNTER — Ambulatory Visit: Admitting: "Endocrinology
